# Patient Record
Sex: Female | Born: 1935 | Race: White | Hispanic: No | Smoking: Never smoker
Health system: Southern US, Community
[De-identification: ages and names within clinical notes are randomized; demographics above are authoritative.]

## PROBLEM LIST (undated history)

## (undated) DIAGNOSIS — I2510 Atherosclerotic heart disease of native coronary artery without angina pectoris: Secondary | ICD-10-CM

## (undated) DIAGNOSIS — F32A Depression, unspecified: Secondary | ICD-10-CM

## (undated) DIAGNOSIS — R2689 Other abnormalities of gait and mobility: Secondary | ICD-10-CM

## (undated) DIAGNOSIS — W19XXXA Unspecified fall, initial encounter: Secondary | ICD-10-CM

## (undated) DIAGNOSIS — R0600 Dyspnea, unspecified: Secondary | ICD-10-CM

## (undated) DIAGNOSIS — K312 Hourglass stricture and stenosis of stomach: Secondary | ICD-10-CM

## (undated) DIAGNOSIS — I491 Atrial premature depolarization: Secondary | ICD-10-CM

## (undated) DIAGNOSIS — Z8489 Family history of other specified conditions: Secondary | ICD-10-CM

## (undated) DIAGNOSIS — I480 Paroxysmal atrial fibrillation: Secondary | ICD-10-CM

## (undated) DIAGNOSIS — I119 Hypertensive heart disease without heart failure: Secondary | ICD-10-CM

## (undated) DIAGNOSIS — F419 Anxiety disorder, unspecified: Secondary | ICD-10-CM

## (undated) DIAGNOSIS — F329 Major depressive disorder, single episode, unspecified: Secondary | ICD-10-CM

## (undated) DIAGNOSIS — Z9981 Dependence on supplemental oxygen: Secondary | ICD-10-CM

## (undated) DIAGNOSIS — I272 Pulmonary hypertension, unspecified: Secondary | ICD-10-CM

## (undated) DIAGNOSIS — I4891 Unspecified atrial fibrillation: Secondary | ICD-10-CM

## (undated) DIAGNOSIS — E7800 Pure hypercholesterolemia, unspecified: Secondary | ICD-10-CM

## (undated) DIAGNOSIS — G4730 Sleep apnea, unspecified: Secondary | ICD-10-CM

## (undated) DIAGNOSIS — M1990 Unspecified osteoarthritis, unspecified site: Secondary | ICD-10-CM

## (undated) DIAGNOSIS — E119 Type 2 diabetes mellitus without complications: Secondary | ICD-10-CM

## (undated) DIAGNOSIS — K311 Adult hypertrophic pyloric stenosis: Secondary | ICD-10-CM

## (undated) DIAGNOSIS — Z87440 Personal history of urinary (tract) infections: Secondary | ICD-10-CM

## (undated) DIAGNOSIS — K219 Gastro-esophageal reflux disease without esophagitis: Secondary | ICD-10-CM

## (undated) DIAGNOSIS — R0609 Other forms of dyspnea: Secondary | ICD-10-CM

## (undated) DIAGNOSIS — K5900 Constipation, unspecified: Secondary | ICD-10-CM

## (undated) DIAGNOSIS — R296 Repeated falls: Secondary | ICD-10-CM

## (undated) DIAGNOSIS — G4700 Insomnia, unspecified: Secondary | ICD-10-CM

## (undated) DIAGNOSIS — H269 Unspecified cataract: Secondary | ICD-10-CM

## (undated) HISTORY — DX: Pure hypercholesterolemia, unspecified: E78.00

## (undated) HISTORY — DX: Repeated falls: R29.6

## (undated) HISTORY — DX: Atherosclerotic heart disease of native coronary artery without angina pectoris: I25.10

## (undated) HISTORY — DX: Atrial premature depolarization: I49.1

## (undated) HISTORY — PX: ABDOMINAL SURGERY: SHX537

## (undated) HISTORY — DX: Dyspnea, unspecified: R06.00

## (undated) HISTORY — DX: Other abnormalities of gait and mobility: R26.89

## (undated) HISTORY — PX: LAPAROSCOPIC SALPINGO OOPHERECTOMY: SHX5927

## (undated) HISTORY — PX: INNER EAR SURGERY: SHX679

## (undated) HISTORY — DX: Sleep apnea, unspecified: G47.30

## (undated) HISTORY — PX: TONSILLECTOMY: SUR1361

## (undated) HISTORY — PX: EYE SURGERY: SHX253

## (undated) HISTORY — DX: Insomnia, unspecified: G47.00

## (undated) HISTORY — DX: Hypertensive heart disease without heart failure: I11.9

## (undated) HISTORY — DX: Unspecified osteoarthritis, unspecified site: M19.90

## (undated) HISTORY — DX: Major depressive disorder, single episode, unspecified: F32.9

## (undated) HISTORY — PX: COLONOSCOPY: SHX174

## (undated) HISTORY — DX: Hourglass stricture and stenosis of stomach: K31.2

## (undated) HISTORY — DX: Gastro-esophageal reflux disease without esophagitis: K21.9

## (undated) HISTORY — DX: Unspecified fall, initial encounter: W19.XXXA

## (undated) HISTORY — DX: Unspecified cataract: H26.9

## (undated) HISTORY — PX: JOINT REPLACEMENT: SHX530

## (undated) HISTORY — DX: Adult hypertrophic pyloric stenosis: K31.1

## (undated) HISTORY — DX: Other forms of dyspnea: R06.09

## (undated) HISTORY — DX: Unspecified atrial fibrillation: I48.91

## (undated) HISTORY — DX: Depression, unspecified: F32.A

## (undated) HISTORY — DX: Anxiety disorder, unspecified: F41.9

## (undated) HISTORY — DX: Pulmonary hypertension, unspecified: I27.20

---

## 1968-03-08 HISTORY — PX: ABDOMINAL HYSTERECTOMY: SHX81

## 1999-04-14 ENCOUNTER — Encounter: Payer: Self-pay | Admitting: Cardiology

## 1999-04-14 ENCOUNTER — Encounter: Admission: RE | Admit: 1999-04-14 | Discharge: 1999-04-14 | Payer: Self-pay | Admitting: Cardiology

## 2000-05-27 ENCOUNTER — Encounter: Admission: RE | Admit: 2000-05-27 | Discharge: 2000-05-27 | Payer: Self-pay | Admitting: Internal Medicine

## 2000-05-27 ENCOUNTER — Encounter: Payer: Self-pay | Admitting: Internal Medicine

## 2001-05-30 ENCOUNTER — Encounter: Admission: RE | Admit: 2001-05-30 | Discharge: 2001-05-30 | Payer: Self-pay | Admitting: Internal Medicine

## 2001-05-30 ENCOUNTER — Encounter: Payer: Self-pay | Admitting: Internal Medicine

## 2001-07-11 ENCOUNTER — Encounter (INDEPENDENT_AMBULATORY_CARE_PROVIDER_SITE_OTHER): Payer: Self-pay | Admitting: *Deleted

## 2001-07-11 ENCOUNTER — Ambulatory Visit (HOSPITAL_COMMUNITY): Admission: RE | Admit: 2001-07-11 | Discharge: 2001-07-11 | Payer: Self-pay | Admitting: Gastroenterology

## 2002-06-01 ENCOUNTER — Encounter: Payer: Self-pay | Admitting: Internal Medicine

## 2002-06-01 ENCOUNTER — Encounter: Admission: RE | Admit: 2002-06-01 | Discharge: 2002-06-01 | Payer: Self-pay | Admitting: Internal Medicine

## 2003-09-06 ENCOUNTER — Ambulatory Visit (HOSPITAL_COMMUNITY): Admission: RE | Admit: 2003-09-06 | Discharge: 2003-09-06 | Payer: Self-pay | Admitting: Internal Medicine

## 2004-06-24 ENCOUNTER — Encounter: Admission: RE | Admit: 2004-06-24 | Discharge: 2004-06-24 | Payer: Self-pay | Admitting: Internal Medicine

## 2004-07-13 ENCOUNTER — Encounter: Admission: RE | Admit: 2004-07-13 | Discharge: 2004-07-13 | Payer: Self-pay | Admitting: Otolaryngology

## 2004-07-15 ENCOUNTER — Ambulatory Visit (HOSPITAL_COMMUNITY): Admission: RE | Admit: 2004-07-15 | Discharge: 2004-07-15 | Payer: Self-pay | Admitting: Otolaryngology

## 2004-07-15 ENCOUNTER — Ambulatory Visit (HOSPITAL_BASED_OUTPATIENT_CLINIC_OR_DEPARTMENT_OTHER): Admission: RE | Admit: 2004-07-15 | Discharge: 2004-07-15 | Payer: Self-pay | Admitting: Otolaryngology

## 2004-09-14 ENCOUNTER — Ambulatory Visit (HOSPITAL_COMMUNITY): Admission: RE | Admit: 2004-09-14 | Discharge: 2004-09-14 | Payer: Self-pay | Admitting: Internal Medicine

## 2007-11-30 ENCOUNTER — Encounter: Payer: Self-pay | Admitting: Cardiology

## 2007-11-30 HISTORY — PX: NM MYOVIEW LTD: HXRAD82

## 2009-01-21 ENCOUNTER — Encounter: Admission: RE | Admit: 2009-01-21 | Discharge: 2009-01-21 | Payer: Self-pay | Admitting: Internal Medicine

## 2009-07-23 ENCOUNTER — Encounter: Payer: Self-pay | Admitting: Cardiology

## 2009-07-23 HISTORY — PX: TRANSTHORACIC ECHOCARDIOGRAM: SHX275

## 2009-12-24 ENCOUNTER — Encounter: Admission: RE | Admit: 2009-12-24 | Discharge: 2009-12-24 | Payer: Self-pay | Admitting: Internal Medicine

## 2010-07-24 NOTE — Op Note (Signed)
NAME:  Melinda, [name] NO.:  [id number]   MEDICAL RECORD NO.:  [id number]          PATIENT TYPE:  AMB   LOCATION:  DSC                          FACILITY:  [facility]   PHYSICIAN:  Hermelinda Medicus, M.D.   DATE OF BIRTH:  January 04, 1932   DATE OF PROCEDURE:  DATE OF DISCHARGE:                                 OPERATIVE REPORT   PREOPERATIVE DIAGNOSIS:  Status post left mastoid tympanoplasty with  ossicular reconstruction approximately 30 years previous.  Presently,  conductive hearing loss sudden deterioration secondary to middle ear  pressure with a superimposed sensory neural loss.   POSTOPERATIVE DIAGNOSIS:  Status post left mastoid tympanoplasty with  ossicular reconstruction approximately 30 years previous.  Presently,  conductive hearing loss sudden deterioration secondary to middle ear  pressure with a superimposed sensory neural loss.   OPERATION:  Exploratory tympanotomy with evaluation of ossicular chain and  ossicular chain reconstruction.   OPERATOR:  Dr. Hermelinda Medicus.   ANESTHESIA:  MAC with Dr. Katrinka Blazing with local anesthesia.   PROCEDURE:  The patient was placed in supine position, left ear elevated,  and was prepped and draped using Betadine and the usual otologic drape.  We  brought the scope into place and the four quadrants of the external ear  canal were injected with 1% Xylocaine with epinephrine, and then the  tympanotomy incision was carried out and carried down to the middle ear  where we reflected the tympanic membrane back and immediately found a  malleus reconstruction where the malleus had moved posteriorly to a point  where it was not in contact with the stapes.  We could tell by moving it  around that it was attached to the tympanic membrane but was not attached to  the stapes itself breaking this ossicular chain continuity.  We were able to  mobilize this malleus and we then rotated it anteriorly so it would come in  contact once again.  We  rasped the bottom side of it so it would attach to  the stapes, and we also cleared the stapes caput or head from any scar  tissue or mucous membrane so that it would also attach.  Once we rotated  this into place, then the contact was obvious, and we placed Gelfoam around  this malleus using it as an implant and using the Gelfoam to stabilize this  malleus implant.  Once this was completed, we then placed the tympanic  membrane back in its original position and then tested her hearing which was  markedly improved. She said she heard like she used to previous to this  problem; therefore, she had just displaced her malleus and we replaced hers  back in its original position and then stabilized this so that she could get  her conductive hearing loss improved. Gelfoam was then placed lateral to the  tympanic membrane in an effort to compress the tympanic membrane so that it  would compress the malleus against the stapes to hold it in place as it  healed.  The Gelfoam was then placed around the incision of the tympanotomy  and then Neosporin ointment was placed.  Cotton in the external ear canal  was placed. The patient tolerated procedure well and is doing well  postoperatively.  During the procedure, she was under local because of any  trauma to the stapes she would be able to tell us immediately.  As we  were mobilizing this malleus, she had no vertigo, she had no excessive  noise.  She was very pleased and very comfortable during the procedure and  then heard very well in her final testing time. Her follow-up will be in one  week, three weeks, and six weeks, and three months and six months.      JC/MEDQ  D:  07/15/2004  T:  07/15/2004  Job:  [id number]   cc:   Loraine Leriche A. Waynard Edwards, M.D.  [address]  [place]  [place] [address]  Fax: [phone]

## 2010-07-24 NOTE — H&P (Signed)
NAME:  Melinda [name], Melinda [name] NO.:  [id number]   MEDICAL RECORD NO.:  [id number]          PATIENT TYPE:  AMB   LOCATION:  DSC                          FACILITY:  [facility]   PHYSICIAN:  Hermelinda Medicus, M.D.   DATE OF BIRTH:  1932-02-19   DATE OF ADMISSION:  07/15/2004  DATE OF DISCHARGE:                                HISTORY & PHYSICAL   HISTORY OF PRESENT ILLNESS:  This patient is a 77 year old female who  approximately 30 years ago had a left mastoid tympanoplasty with acicular  reconstruction under my care, and she has done extremely well over these  years.  However, recently, she apparently was not hearing as well and tried  to pop her ear by putting pressure on this, and suddenly, the hearing just  totally dropped off.  She came into the office and audiogram showed  proximally a 40-50 decibel sensory neural loss, but on top of this, a 45-50  decibel conductive hearing loss with a very loose impedence study indicating  that the ossicular chain had been separated and the tympanic membrane was  just very mobile showing no communication with stapes.  With this, we  decided to get a CT scan also of her temporal bones and showing post  surgical changes in the left ossicular chain where the malleolus had been  mobilized and put over the stapes, but this was moved and apparently on the  CT scan it was showing no communication with the stapes.  She now wishes to  hear better.  She accepts the fact that she is still going to have the 45-50  decibel sensory neural hearing loss, but right now, she has a totally  fundamentally non-aideable ear, and she wishes to try to use a hearing aid.  If she can gain back some of the conductive loss, then we can fit her to a  hearing aid where she will have quite effective hearing.  Her past history  is remarkable in the fact that she has a phenobarbital allergy rash.  She is  on Nortriptyline 25, Pravachol, aspirin, Prilosec 20, Metoprolol 50,  Glycolax 17 and clorazepate dipotassium 7.5 mg for anxiety.  Her hemoglobin  is 14.1, and she has had a history of left ear surgery above mentioned,  hysterectomy for ovarian cyst treatment in age 76's, and she has had  cataract removed 3-4 years ago.  She had a stress test where she had right  chest pain which showed normal results done approximately three years ago by  Dr. Swaziland.   PHYSICAL EXAMINATION:  VITAL SIGNS:  Blood pressure 138/73, respirations 20,  heart rate 60, weight 170.  HEENT:  Right ear is in good condition, and is hearing really quite well  with moderate sensory neural loss and no conductive loss of note.  Her left  ear appears quite excellent in terms of the tympanic membrane, but it is  very mobile, and she has a sensory neural plus the conductive hearing loss.  Above mentioned.  CARDIOVASCULAR:  Unremarkable.  No __________ mass, murmurs or gallops.  Workup with Dr. Swaziland revealed normal EKG and showing normal stress  Cardiolite perfusion study, normal left ventricular function, hypertensive  blood pressure response to exercise.  Ejection fraction is calculated at  78%.  ABDOMEN:  Unremarkable.  EXTREMITIES:  Unremarkable.   DIAGNOSIS:  History of left mastoid tympanoplasty with ossicular chain  disruption, history of chest pain, completely evaluated with a normal  Cardiolite perfusion study, history of hysterectomy and ovarian cyst care,  history of cataract removal.      JC/MEDQ  D:  07/15/2004  T:  07/15/2004  Job:  [id number]   cc:   Loraine Leriche A. Waynard Edwards, M.D.  [address]  [place]  [place] [address]  Fax: [phone]

## 2010-07-24 NOTE — Procedures (Signed)
[facility]  Patient:    Melinda [name], Melinda [name] Visit Number: [id number] MRN: [id number]          Service Type: END Location: ENDO Attending Physician:  Louie Bun Dictated by:   Everardo All Madilyn Fireman, M.D. Proc. Date: 07/11/01 Admit Date:  07/11/2001   CC:         Rodrigo Ran, M.D.   Procedure Report  PROCEDURE:  Colonoscopy with polypectomy.  INDICATION FOR PROCEDURE:  Family history of colon cancer in a first degree relative.  DESCRIPTION OF PROCEDURE:  The patient was placed in the left lateral decubitus position and placed on the pulse monitor with continuous low flow oxygen delivered via nasal cannula. She was sedated with 50 mg IV Demerol and 6 mg IV Versed. The Olympus video colonoscope was inserted into the rectum and advanced to the cecum, confirmed by transillumination of McBurneys point and visualization of the ileocecal valve an appendiceal orifice. The prep was excellent. The cecum and ascending colon appeared normal. In the proximal transverse colon, there was seen a small 7 to 8-mm sessile polyp which was fulgurated by hot biopsy. The remainder of the transverse colon appeared normal. In the proximal descending colon, there was seen a 6-mm sessile polyp, also fulgurated by hot biopsy. The remainder of the descending, sigmoid, and rectum appeared normal with no further polyps, masses, diverticula or other mucosal abnormalities. Retroflex view of the anus revealed no obvious internal hemorrhoids. The colonoscope was then withdrawn and the patient returned to the recovery room in stable condition. She tolerated the procedure well and there were no immediate complications.  IMPRESSION:  Transverse and descending colon polyps.  PLAN:  Await histology for determination of interval for next colonoscopy. Dictated by:   Everardo All Madilyn Fireman, M.D. Attending Physician:  Louie Bun DD:  07/11/01 TD:  07/11/01 Job: [id number]

## 2010-10-05 ENCOUNTER — Other Ambulatory Visit: Payer: Self-pay | Admitting: Internal Medicine

## 2010-10-05 DIAGNOSIS — Z1231 Encounter for screening mammogram for malignant neoplasm of breast: Secondary | ICD-10-CM

## 2010-12-31 ENCOUNTER — Ambulatory Visit
Admission: RE | Admit: 2010-12-31 | Discharge: 2010-12-31 | Disposition: A | Discharge status: Home / Self Care | Payer: Medicare Other | Source: Ambulatory Visit | Attending: Internal Medicine | Admitting: Internal Medicine

## 2010-12-31 DIAGNOSIS — Z1231 Encounter for screening mammogram for malignant neoplasm of breast: Secondary | ICD-10-CM

## 2011-06-03 ENCOUNTER — Encounter: Payer: Self-pay | Admitting: *Deleted

## 2011-11-26 ENCOUNTER — Other Ambulatory Visit: Payer: Self-pay | Admitting: Internal Medicine

## 2011-11-26 DIAGNOSIS — Z1231 Encounter for screening mammogram for malignant neoplasm of breast: Secondary | ICD-10-CM

## 2012-01-03 ENCOUNTER — Ambulatory Visit
Admission: RE | Admit: 2012-01-03 | Discharge: 2012-01-03 | Disposition: A | Discharge status: Home / Self Care | Payer: Medicare Other | Source: Ambulatory Visit | Attending: Internal Medicine | Admitting: Internal Medicine

## 2012-01-03 DIAGNOSIS — Z1231 Encounter for screening mammogram for malignant neoplasm of breast: Secondary | ICD-10-CM

## 2012-01-05 ENCOUNTER — Ambulatory Visit: Payer: Medicare Other | Attending: Neurology | Admitting: *Deleted

## 2012-01-05 DIAGNOSIS — Z9181 History of falling: Secondary | ICD-10-CM | POA: Insufficient documentation

## 2012-01-05 DIAGNOSIS — R269 Unspecified abnormalities of gait and mobility: Secondary | ICD-10-CM | POA: Insufficient documentation

## 2012-01-05 DIAGNOSIS — IMO0001 Reserved for inherently not codable concepts without codable children: Secondary | ICD-10-CM | POA: Insufficient documentation

## 2012-01-17 ENCOUNTER — Ambulatory Visit: Payer: Medicare Other | Attending: Neurology | Admitting: Physical Therapy

## 2012-01-17 DIAGNOSIS — R269 Unspecified abnormalities of gait and mobility: Secondary | ICD-10-CM | POA: Insufficient documentation

## 2012-01-17 DIAGNOSIS — Z9181 History of falling: Secondary | ICD-10-CM | POA: Insufficient documentation

## 2012-01-17 DIAGNOSIS — IMO0001 Reserved for inherently not codable concepts without codable children: Secondary | ICD-10-CM | POA: Insufficient documentation

## 2012-01-19 ENCOUNTER — Ambulatory Visit: Payer: Medicare Other | Admitting: Physical Therapy

## 2012-01-24 ENCOUNTER — Ambulatory Visit: Payer: Medicare Other | Admitting: *Deleted

## 2012-01-26 ENCOUNTER — Ambulatory Visit: Payer: Medicare Other | Admitting: Physical Therapy

## 2012-01-27 ENCOUNTER — Encounter: Payer: Medicare Other | Admitting: Physical Therapy

## 2012-02-07 ENCOUNTER — Ambulatory Visit: Payer: Medicare Other | Attending: Neurology | Admitting: Physical Therapy

## 2012-02-07 DIAGNOSIS — IMO0001 Reserved for inherently not codable concepts without codable children: Secondary | ICD-10-CM | POA: Insufficient documentation

## 2012-02-07 DIAGNOSIS — R269 Unspecified abnormalities of gait and mobility: Secondary | ICD-10-CM | POA: Insufficient documentation

## 2012-02-07 DIAGNOSIS — Z9181 History of falling: Secondary | ICD-10-CM | POA: Insufficient documentation

## 2012-02-10 ENCOUNTER — Ambulatory Visit: Payer: Medicare Other | Admitting: Physical Therapy

## 2012-02-14 ENCOUNTER — Encounter: Payer: Medicare Other | Admitting: Physical Therapy

## 2012-02-17 ENCOUNTER — Encounter: Payer: Medicare Other | Admitting: Physical Therapy

## 2012-06-12 ENCOUNTER — Other Ambulatory Visit: Payer: Self-pay | Admitting: Internal Medicine

## 2012-06-12 DIAGNOSIS — M545 Low back pain, unspecified: Secondary | ICD-10-CM

## 2012-06-13 ENCOUNTER — Other Ambulatory Visit: Payer: Self-pay | Admitting: Internal Medicine

## 2012-06-13 DIAGNOSIS — Z139 Encounter for screening, unspecified: Secondary | ICD-10-CM

## 2012-06-15 ENCOUNTER — Ambulatory Visit
Admission: RE | Admit: 2012-06-15 | Discharge: 2012-06-15 | Disposition: A | Discharge status: Home / Self Care | Payer: Medicare Other | Source: Ambulatory Visit | Attending: Internal Medicine | Admitting: Internal Medicine

## 2012-06-15 DIAGNOSIS — Z139 Encounter for screening, unspecified: Secondary | ICD-10-CM

## 2012-06-17 ENCOUNTER — Ambulatory Visit
Admission: RE | Admit: 2012-06-17 | Discharge: 2012-06-17 | Disposition: A | Discharge status: Home / Self Care | Payer: Medicare Other | Source: Ambulatory Visit | Attending: Internal Medicine | Admitting: Internal Medicine

## 2012-06-17 DIAGNOSIS — M545 Low back pain, unspecified: Secondary | ICD-10-CM

## 2012-10-19 ENCOUNTER — Encounter: Payer: Self-pay | Admitting: Neurology

## 2012-10-19 ENCOUNTER — Ambulatory Visit (INDEPENDENT_AMBULATORY_CARE_PROVIDER_SITE_OTHER): Payer: Medicare Other | Admitting: Neurology

## 2012-10-19 VITALS — BP 157/79 | HR 71 | Resp 18 | Ht 60.0 in | Wt 169.0 lb

## 2012-10-19 DIAGNOSIS — E1142 Type 2 diabetes mellitus with diabetic polyneuropathy: Secondary | ICD-10-CM

## 2012-10-19 DIAGNOSIS — G4730 Sleep apnea, unspecified: Secondary | ICD-10-CM

## 2012-10-19 DIAGNOSIS — E1149 Type 2 diabetes mellitus with other diabetic neurological complication: Secondary | ICD-10-CM

## 2012-10-19 DIAGNOSIS — G4733 Obstructive sleep apnea (adult) (pediatric): Secondary | ICD-10-CM

## 2012-10-19 DIAGNOSIS — E1140 Type 2 diabetes mellitus with diabetic neuropathy, unspecified: Secondary | ICD-10-CM

## 2012-10-19 DIAGNOSIS — R269 Unspecified abnormalities of gait and mobility: Secondary | ICD-10-CM

## 2012-10-19 DIAGNOSIS — R2689 Other abnormalities of gait and mobility: Secondary | ICD-10-CM

## 2012-10-19 HISTORY — DX: Sleep apnea, unspecified: G47.30

## 2012-10-19 NOTE — Patient Instructions (Signed)

## 2012-10-19 NOTE — Progress Notes (Signed)
[facility]  Provider:  Melvyn Novas, M D  Referring Provider: No ref. provider found Primary Care Physician:  Ezequiel Kayser, MD  Chief Complaint  Patient presents with  . Follow-up    rm 11,hypersomnia    HPI:  Melinda [name] is a 79 y.o. female  Is seen here as a referral/ revisit  from Dr. Waynard Edwards for her yearly Apnea check up, compliance with CPAP .  [name] endorsed the Epworth sleepiness score today at 7 points and the GDS score at points. She reports her fatigue severity at 34 points. She has brought her machine which is nearly 77 years old -I am still waiting for the 2014 download.  At baseline,  the patient had been tested in on 10/13/2005 and was diagnosed with rather mild sleep apnea but with  strong REM sleep accentuation.  She had also oxygen desaturations at night,  a download in October 2013 documented  residual AHI  of 5.1 , only marginally less than her baseline AHI - the average user time  was  6 hours,  the machine is set at 10 cm water. She needed a new  DME after she did not receive her care she needed from her previous [employer] and she is [facility] with [facility]  The respiratory technician also changed her from a full facemask to a nasal mask and she is able to tolerate that very well. I would like to add that in 2008 the patient underwent a pulse oximetry which seemed to have been rub or artifact performed based on that she was prescribed 2 L of oxygen at night,  that but was never able to obtain the oxygen on [employer]. She is not on oxygen now, she is using CPAP at the previous settings,  I would like  To state here , that if her apnea hypopnea index remains at higher single digits , she may need to undergo or another auto titration to just see if she still needs CPAP at all.  She has recently developed some insomnia stating that she would be wide awake in the early morning hours. Her experience with Ambien as a sleep aid  has been testing. The patient apparently had some sleep or night terrors and sleep walking activity she has for example unlocked doors and left [name] tore her house at night she was also at one time witnessed to be screaming when she was dreaming but had a nightmarish vision. After discontinuing the Ambien these events have stopped.  The patient reports that her gait problem has stabilized she's doing better had no fall for the last 6 months. She was finally diagnosed officially with lumbar spinal stenosis, she has lost some muscle mass all through the  hamstrings and thigh, lost some core muscle strength. She had previously fallen many times onto her left knee which still gives her trouble now. He had chiropractic treatments to the left hip and leg. The bony injury to the knee was ruled out. The patient has more pain in her knee when walking on tiptoes, she does not have foot drop .  Review of Systems: Out of a complete 14 system review, the patient complains of only the following symptoms, and all other reviewed systems are negative. Knee and leg pain, weakness, needs to brace herself to leave a seated position.   History   Social History  . Marital Status: Married    Spouse Name: N/A  Number of Children: 1  . Years of Education: 13   Occupational History  . Retired    Social History Main Topics  . Smoking status: Never Smoker   . Smokeless tobacco: Never Used  . Alcohol Use: Yes     Comment: occas.  . Drug Use: No  . Sexual Activity: Not on file   Other Topics Concern  . Not on file   Social History Narrative  . No narrative on file    Family History  Problem Relation Age of Onset  . Heart attack Father   . Heart failure Mother   . Cancer Mother     colon,kidney  . Diabetes Sister   . Hypertension Sister     Past Medical History  Diagnosis Date  . Hypercholesterolemia   . Acid reflux   . Obstructive sleep apnea   . Depression   . Insomnia   . Premature  atrial contractions   . Anxiety   . Hypertension   . Cataract     surgery,bilateral  . Pyloric antral stenosis     in childhood,infancy  . Multifactorial gait disorder   . Sleep apnea with use of continuous positive airway pressure (CPAP) 10/19/2012    Past Surgical History  Procedure Laterality Date  . Abdominal hysterectomy  1970  . Transthoracic echocardiogram  07/23/2009    mild concentric LVH/mild mitral insufficiency/ moderate tricuspid innsufficiency/mild pulmonary HTN/EF- 55-60%  . Nm myoview ltd  11/30/2007    normal stress nuclear study/EF-83%  . Vaginal delivery    . Inner ear surgery Left     Current Outpatient Prescriptions  Medication Sig Dispense Refill  . acetaminophen (TYLENOL) 325 MG tablet Take 650 mg by mouth every 6 (six) hours as needed.      [name] amLODipine (NORVASC) 5 MG tablet Take 5 mg by mouth daily.      . Armodafinil (NUVIGIL) 250 MG tablet Take 250 mg by mouth daily. As needed,take 1/2 mornings,1/2 early afternoon.      [name] aspirin 81 MG tablet Take 81 mg by mouth daily.      [name] atorvastatin (LIPITOR) 40 MG tablet Take 40 mg by mouth daily. One tablet in am      . benazepril (LOTENSIN) 20 MG tablet Take 20 mg by mouth daily.      . calcium carbonate (OS-CAL) 600 MG TABS tablet Take 600 mg by mouth daily.      . cholecalciferol (VITAMIN D) 1000 UNITS tablet Take 1,000 Units by mouth daily.      [name] desipramine (NORPRAMIN) 25 MG tablet Take 25 mg by mouth daily.      [name] glucose blood (ACCU-CHEK ACTIVE STRIPS) test strip 1 each by Other route as needed for other. Use as instructed      . metoprolol (LOPRESSOR) 50 MG tablet Take 1/2 tablet by mouth twice daily      . Omega-3 Fatty Acids (FISH OIL) 1200 MG CAPS Take by mouth daily.      [name] omeprazole (PRILOSEC) 20 MG capsule Take 20 mg by mouth daily. One tablet daily in mornings      . polyethylene glycol powder (GLYCOLAX/MIRALAX) powder Take as needed      . temazepam (RESTORIL) 7.5 MG capsule Take 7.5 mg by mouth  daily. Take 1-2 as needed      . vitamin B-12 (CYANOCOBALAMIN) 500 MCG tablet Take 1,000 mcg by mouth daily.        No current facility-administered medications for this visit.  Allergies as of 10/19/2012 - Review Complete 10/19/2012  Allergen Reaction Noted  . Phenobarbital Itching 06/03/2011    Vitals: BP 157/79  Pulse 71  Resp 18  Ht 5' (1.524 m)  Wt 169 lb (76.658 kg)  BMI 33.01 kg/m2 Last Weight:  Wt Readings from Last 1 Encounters:  10/19/12 169 lb (76.658 kg)   Last Height:   Ht Readings from Last 1 Encounters:  10/19/12 5' (1.524 m)   BMI  .  Physical exam:  General: The patient is awake, alert and appears not in acute distress. The patient is well groomed. Head: Normocephalic, atraumatic. Neck is supple. Mallampati  3 , neck circumference: 14 inches, no nasal deviation.  Rhinitis.   Cardiovascular:  Regular rate and rhythm, without  murmurs or carotid bruit, and without distended neck veins. Respiratory: Lungs are clear to auscultation. Skin:  Without evidence of edema, or rash Trunk:obese   Neurologic exam : The patient is awake and alert, oriented to place and time.  Memory subjectivedescribed as intact. There is a normal attention span & concentration ability. Speech is fluent without  dysphonia , not  Aphasia.  Mood and affect are appropriate.  Cranial nerves: Pupils are equal and briskly reactive to light. Funduscopic exam without  evidence of pallor or edema. Extraocular movements  in vertical and horizontal planes intact and without nystagmus. Visual fields by finger perimetry are intact. Hearing to finger rub intact.  Facial sensation intact to fine touch. Facial motor strength is symmetric and tongue and uvula move midline.  Motor exam:  Distal movement and feet hand is intact, the patient is crepitation of the left knee which also limits her range of motion. She has hip abductor weakness. She does not necessarily list to one side or the  other Sensory:  Fine touch, pinprick and vibration were tested in all extremities. The patient reported pain in needle sensation in the first 2 toes of either foot, she has lost vibratory sense in her pool was she has also lost vibratory sense over the bunions she was unable to feel fine filament touch on toe was warm median foot. The right ankle was still vibration sensitive, the left is not.  Proprioception is tested in the upper extremities normal. The patient was able to perform the finger-to-nose test without dysmetria, ataxia that may be a mild action tremor that of almost unnoticeable amplitude.  Coordination: Rapid alternating movements in the fingers/hands is tested and normal. Finger-to-nose maneuverwithout evidence of ataxia, dysmetria or tremor.  Gait and station: Patient walks without assistive device, wide based. The patient has in the past fall and propulsive forward and had actually had and face injury in 2013. She does not list to one side, she was able to walk a couple of steps on her tiptoes but with pain in the knee region especially on the left side. Heel stand and heel gait is intact. Romberg bundle for is positive, the patient displays pulses and retropulsive not to the side. Without optic control she feels very insecure on her feet. She reports that if she walks in a dark room she runs into objects she has no sense of orientation. Deep tendon reflexes: in the  upper and lower extremities are attenuated. Babinski maneuver response is equivocal ..   Assessment:  After physical and neurologic examination, review of laboratory studies, imaging, neurophysiology testing and pre-existing records, assessment is  #1 the patient has limbal spinal stenosis which has led to some muscle mass loss especially at the  quadriceps the hip flexors. There is mostly proximal claustral causal muscle weakness. #2 the patient has neuropathy with associated gait and balance and stability positive Romberg  sign but no cerebellar signs. #3 mild sleep apnea but at least moderate to severe hypoxemia in the past documented in the sleep study 7 years ago. The patient has continued to use CPAP. I would like to reevaluate her if she has still the need for apnea treatment, her machine is actually faxed to her current needed pressures. For this we will order an audible titration for 30 days and allow a pressure window between 6 and 13 cm water. [facility]  Plan:  See above .  Check B12, TSH and HbA1c , may have been recently tested with Dr Waynard Edwards in May .  DME Gait stability exercises , core strength built up.

## 2012-10-19 NOTE — Assessment & Plan Note (Signed)
lumbal spinal stenosis- cause for gait disorder.

## 2012-10-23 LAB — TSH: TSH: 3.2 u[IU]/mL (ref 0.450–4.500)

## 2012-11-30 ENCOUNTER — Encounter: Payer: Self-pay | Admitting: Neurology

## 2012-12-18 ENCOUNTER — Other Ambulatory Visit: Payer: Self-pay

## 2012-12-18 DIAGNOSIS — Z1231 Encounter for screening mammogram for malignant neoplasm of breast: Secondary | ICD-10-CM

## 2012-12-21 ENCOUNTER — Encounter: Payer: Self-pay | Admitting: Neurology

## 2012-12-22 ENCOUNTER — Telehealth: Payer: Self-pay | Admitting: Neurology

## 2012-12-22 DIAGNOSIS — G4733 Obstructive sleep apnea (adult) (pediatric): Secondary | ICD-10-CM

## 2012-12-22 NOTE — Telephone Encounter (Signed)
Increase pressure setting to 9 cm water .

## 2013-01-11 ENCOUNTER — Other Ambulatory Visit: Payer: Self-pay

## 2013-01-12 ENCOUNTER — Ambulatory Visit
Admission: RE | Admit: 2013-01-12 | Discharge: 2013-01-12 | Disposition: A | Discharge status: Home / Self Care | Payer: Medicare Other | Source: Ambulatory Visit

## 2013-01-12 DIAGNOSIS — Z1231 Encounter for screening mammogram for malignant neoplasm of breast: Secondary | ICD-10-CM

## 2013-01-19 ENCOUNTER — Encounter: Payer: Self-pay | Admitting: Nurse Practitioner

## 2013-01-19 ENCOUNTER — Ambulatory Visit (INDEPENDENT_AMBULATORY_CARE_PROVIDER_SITE_OTHER): Payer: Medicare Other | Admitting: Nurse Practitioner

## 2013-01-19 VITALS — BP 133/61 | HR 69 | Ht 60.0 in | Wt 175.0 lb

## 2013-01-19 DIAGNOSIS — G4733 Obstructive sleep apnea (adult) (pediatric): Secondary | ICD-10-CM

## 2013-01-19 DIAGNOSIS — R2689 Other abnormalities of gait and mobility: Secondary | ICD-10-CM

## 2013-01-19 DIAGNOSIS — G4730 Sleep apnea, unspecified: Secondary | ICD-10-CM

## 2013-01-19 DIAGNOSIS — R269 Unspecified abnormalities of gait and mobility: Secondary | ICD-10-CM

## 2013-01-19 NOTE — Progress Notes (Signed)
[name]   [facility]  PATIENT: Melinda [name] DOB: 08/26/1931   REASON FOR VISIT: Followup for gait disorder   HISTORY OF PRESENT ILLNESS:Ms Melinda [name], 79 year old female returns for followup. She was just seen in the office 10/19/2012 by Dr. Vickey [name]. She patient reports that her gait problem has stabilized she's doing better had no fall for the last 9 months. She occasionally can use a cane or walker on bad days but not very often. She has also been checking her blood sugars in one day when she felt she was going to pass out her blood sugar was 60. She was finally diagnosed officially with lumbar spinal stenosis, she has lost some muscle mass all through the hamstrings and thigh, lost some core muscle strength. She had previously fallen many times onto her left knee which still gives her trouble at times  She had chiropractic treatments to the left hip and leg. The bony injury to the knee was ruled out. The patient has more pain in her knee when walking on tiptoes, she does not have foot drop . She says she has been told she has arthritis. She also complains of occasional burning paresthesias in the leg however this is very brief and once or twice a month. She does not wish to be placed on a medication.    REVIEW OF SYSTEMS: Full 14 system review of systems performed and notable only for:  Constitutional: Fatigue Cardiovascular: N/A  Ear/Nose/Throat: Hearing loss  Skin: N/A  Eyes: N/A  Respiratory: N/A  Gastroitestinal: N/A  Hematology/Lymphatic: Easy bleeding  Endocrine: N/A Musculoskeletal: Joint pain Allergy/Immunology: Runny nose  Psychiatric: Decreased energy  ALLERGIES: Allergies  Allergen Reactions  . Phenobarbital Itching    HOME MEDICATIONS: Outpatient Prescriptions Prior to Visit  Medication Sig Dispense Refill  . acetaminophen (TYLENOL) 325 MG tablet Take 650 mg by mouth every 6 (six) hours as needed.      [name] amLODipine (NORVASC) 5 MG tablet  Take 5 mg by mouth daily.      . Armodafinil (NUVIGIL) 250 MG tablet Take 250 mg by mouth daily. As needed,take 1/2 mornings,1/2 early afternoon.      [name] aspirin 81 MG tablet Take 81 mg by mouth daily.      [name] atorvastatin (LIPITOR) 40 MG tablet Take 40 mg by mouth daily. One tablet in am      . benazepril (LOTENSIN) 20 MG tablet Take 20 mg by mouth daily.      . calcium carbonate (OS-CAL) 600 MG TABS tablet Take 600 mg by mouth daily.      . cholecalciferol (VITAMIN D) 1000 UNITS tablet Take 1,000 Units by mouth daily.      [name] desipramine (NORPRAMIN) 25 MG tablet Take 25 mg by mouth daily.      [name] glucose blood (ACCU-CHEK ACTIVE STRIPS) test strip 1 each by Other route as needed for other. Use as instructed      . metoprolol (LOPRESSOR) 50 MG tablet Take 1/2 tablet by mouth twice daily      . Omega-3 Fatty Acids (FISH OIL) 1200 MG CAPS Take by mouth daily.      [name] omeprazole (PRILOSEC) 20 MG capsule Take 20 mg by mouth daily. One tablet daily in mornings      . polyethylene glycol powder (GLYCOLAX/MIRALAX) powder Take as needed      . temazepam (RESTORIL) 7.5 MG capsule Take 7.5 mg by mouth daily. Take 1-2 as needed      .  vitamin B-12 (CYANOCOBALAMIN) 500 MCG tablet Take 1,000 mcg by mouth daily.        No facility-administered medications prior to visit.    PAST MEDICAL HISTORY: Past Medical History  Diagnosis Date  . Hypercholesterolemia   . Acid reflux   . Obstructive sleep apnea   . Depression   . Insomnia   . Premature atrial contractions   . Anxiety   . Hypertension   . Cataract     surgery,bilateral  . Pyloric antral stenosis     in childhood,infancy  . Multifactorial gait disorder   . Sleep apnea with use of continuous positive airway pressure (CPAP) 10/19/2012    PAST SURGICAL HISTORY: Past Surgical History  Procedure Laterality Date  . Abdominal hysterectomy  1970  . Transthoracic echocardiogram  07/23/2009    mild concentric LVH/mild mitral insufficiency/ moderate  tricuspid innsufficiency/mild pulmonary HTN/EF- 55-60%  . Nm myoview ltd  11/30/2007    normal stress nuclear study/EF-83%  . Vaginal delivery    . Inner ear surgery Left     FAMILY HISTORY: Family History  Problem Relation Age of Onset  . Heart attack Father   . Heart failure Mother   . Cancer Mother     colon,kidney  . Diabetes Sister   . Hypertension Sister     SOCIAL HISTORY: History   Social History  . Marital Status: Married    Spouse Name: N/A    Number of Children: 1  . Years of Education: 13   Occupational History  . Retired    Social History Main Topics  . Smoking status: Never Smoker   . Smokeless tobacco: Never Used  . Alcohol Use: Yes     Comment: occas.  . Drug Use: No  . Sexual Activity: Not on file   Other Topics Concern  . Not on file   Social History Narrative   Patient lives alone.    Patient is widowed.    Patient retired.    Patient some college.    Patient has one child.      PHYSICAL EXAM  Filed Vitals:   01/19/13 1359  BP: 133/61  Pulse: 69  Height: 5' (1.524 m)  Weight: 175 lb (79.379 kg)   Body mass index is 34.18 kg/(m^2).  Generalized: Well developed, obese female in no acute distress  Head: normocephalic and atraumatic,. Oropharynx benign  Neck: Supple, no carotid bruits  Cardiac: Regular rate rhythm, no murmur  Neurological examination   Mentation: Alert oriented to time, place, history taking. Follows all commands speech and language fluent. ESS 8  Cranial nerve II-XII: Pupils were equal round reactive to light extraocular movements were full, visual field were full on confrontational test. Facial sensation and strength were normal. hearing was intact to finger rubbing bilaterally. Uvula tongue midline. head turning and shoulder shrug and were normal and symmetric.Tongue protrusion into cheek strength was normal. Motor: normal bulk and tone, full strength in the BUE, BLE, fine finger movements normal, no pronator  drift. No focal weakness Sensory: normal and symmetric to light touch, vibration and proprioception. Decreased pinprick to mid shin bilaterally Coordination: finger-nose-finger, heel-to-shin bilaterally, no dysmetria Reflexes: Brachioradialis 2/2, biceps 2/2, triceps 2/2, patellar 2/2, Achilles 2/2, plantar responses were flexor bilaterally. Gait and Station: Rising up from seated position without assistance, normal stance,  moderate stride, good arm swing, smooth turning, able to perform tiptoe, and heel walking without difficulty. Tandem gait mildly unsteady   DIAGNOSTIC DATA (LABS, IMAGING, TESTING) - I reviewed patient  records, labs, notes, testing and imaging myself where available.       Lab Results  Component Value Date   TSH 3.200 10/19/2012      ASSESSMENT AND PLAN  79 y.o. year old female  has a past medical history of Hypercholesterolemia;  Obstructive sleep apnea; Depression; Insomnia; Multifactorial gait disorder; and Sleep apnea with use of continuous positive airway pressure (CPAP) here for followup. Her CPAP is at 10 cm. She denies any falls in the last 9 months. She has rare paresthesias  TSH and methylmalonic acid return normal Burning paresthesias are intermittent at this point and patient does not wish to be on medication She will call if this becomes more problematic Nilda Riggs, [facility], [facility], APRN  [facility] [address] [place], [place] [address] [phone]

## 2013-01-19 NOTE — Patient Instructions (Signed)
Patient to continue CPAP at 10 cm TSH and methylmalonic acid return normal Burning paresthesias are intermittent at this point and patient does not wish to be on medication She will call if this becomes more problematic

## 2013-07-20 ENCOUNTER — Encounter: Payer: Self-pay | Admitting: Nurse Practitioner

## 2013-07-20 ENCOUNTER — Ambulatory Visit (INDEPENDENT_AMBULATORY_CARE_PROVIDER_SITE_OTHER): Payer: Medicare Other | Admitting: Nurse Practitioner

## 2013-07-20 VITALS — BP 146/75 | HR 65 | Ht 60.0 in | Wt 177.0 lb

## 2013-07-20 DIAGNOSIS — R2689 Other abnormalities of gait and mobility: Secondary | ICD-10-CM

## 2013-07-20 DIAGNOSIS — R269 Unspecified abnormalities of gait and mobility: Secondary | ICD-10-CM

## 2013-07-20 DIAGNOSIS — G4733 Obstructive sleep apnea (adult) (pediatric): Secondary | ICD-10-CM

## 2013-07-20 DIAGNOSIS — G4730 Sleep apnea, unspecified: Secondary | ICD-10-CM

## 2013-07-20 NOTE — Patient Instructions (Signed)
Down load of CPAP F/U in 6 months with Dr. Brett Fairy

## 2013-07-20 NOTE — Progress Notes (Signed)
[facility]  PATIENT: Melinda [name] DOB: [birth year]   REASON FOR VISIT: Followup for sleep apnea and multifactorial gait disorder   HISTORY OF PRESENT ILLNESS: Ms. Melinda [name] 80 year old female returns for followup. She patient reports that her gait problem has stabilized she's doing better had not had a fall for the months.  She occasionally can use a cane or walker on bad days but not very often.  She was finally diagnosed officially with lumbar spinal stenosis, she has lost some muscle mass all through the hamstrings and thigh, lost some core muscle strength. She had previously fallen many times onto her left knee which still gives her trouble at times   The patient has more pain in her knee when walking on tiptoes, she does not have foot drop . She says she has been told she has arthritis. She also complains of occasional burning paresthesias in the leg however this is very brief and once or twice a month. She does not wish to be placed on a medication. She remains on CPAP and she brought her machine for download. She does say that her machine is loud and old and she sometimes cuts it off due to the sound. She returns for reevaluation     REVIEW OF SYSTEMS: Full 14 system review of systems performed and notable only for those listed, all others are neg:  Constitutional: Fatigue  Cardiovascular: N/A  Ear/Nose/Throat: Hearing loss  Skin: N/A  Eyes: N/A  Respiratory: cough, shortness of breath Gastroitestinal: Constipation  Hematology/Lymphatic: Easy bruising Endocrine: N/A Musculoskeletal: Joint pain joint swelling, walking difficulty Allergy/Immunology: N/A  Neurological: N/A Psychiatric: Depression  Sleep : Obstructive sleep apnea   ALLERGIES: Allergies  Allergen Reactions  . Phenobarbital Itching    HOME MEDICATIONS: Outpatient Prescriptions Prior to Visit  Medication Sig Dispense Refill  . acetaminophen (TYLENOL) 325 MG tablet Take 650 mg by  mouth every 6 (six) hours as needed.      [name] amLODipine (NORVASC) 5 MG tablet Take 5 mg by mouth daily.      . Armodafinil (NUVIGIL) 250 MG tablet Take 250 mg by mouth as needed. As needed for driving      . aspirin 81 MG tablet Take 81 mg by mouth daily.      [name] atorvastatin (LIPITOR) 40 MG tablet Take 40 mg by mouth daily. One tablet in am      . benazepril (LOTENSIN) 20 MG tablet Take 20 mg by mouth daily.      . calcium carbonate (OS-CAL) 600 MG TABS tablet Take 600 mg by mouth daily.      . cholecalciferol (VITAMIN D) 1000 UNITS tablet Take 1,000 Units by mouth daily.      [name] desipramine (NORPRAMIN) 25 MG tablet Take 25 mg by mouth daily.      [name] glucose blood (ACCU-CHEK ACTIVE STRIPS) test strip 1 each by Other route as needed for other. Use as instructed      . metoprolol (LOPRESSOR) 50 MG tablet Take 1/2 tablet by mouth twice daily      . Omega-3 Fatty Acids (FISH OIL) 1200 MG CAPS Take by mouth daily.      [name] omeprazole (PRILOSEC) 20 MG capsule Take 20 mg by mouth daily. One tablet daily in mornings      . temazepam (RESTORIL) 7.5 MG capsule Take 7.5 mg by mouth daily. Take 1-2 as needed      . vitamin B-12 (CYANOCOBALAMIN) 500 MCG tablet Take 1,000 mcg by mouth  daily.       . polyethylene glycol powder (GLYCOLAX/MIRALAX) powder Take as needed       No facility-administered medications prior to visit.    PAST MEDICAL HISTORY: Past Medical History  Diagnosis Date  . Hypercholesterolemia   . Acid reflux   . Obstructive sleep apnea   . Depression   . Insomnia   . Premature atrial contractions   . Anxiety   . Hypertension   . Cataract     surgery,bilateral  . Pyloric antral stenosis     in childhood,infancy  . Multifactorial gait disorder   . Sleep apnea with use of continuous positive airway pressure (CPAP) 10/19/2012    PAST SURGICAL HISTORY: Past Surgical History  Procedure Laterality Date  . Abdominal hysterectomy  1970  . Transthoracic echocardiogram  07/23/2009    mild  concentric LVH/mild mitral insufficiency/ moderate tricuspid innsufficiency/mild pulmonary HTN/EF- 55-60%  . Nm myoview ltd  11/30/2007    normal stress nuclear study/EF-83%  . Vaginal delivery    . Inner ear surgery Left     FAMILY HISTORY: Family History  Problem Relation Age of Onset  . Heart attack Father   . Heart failure Mother   . Cancer Mother     colon,kidney  . Diabetes Sister   . Hypertension Sister     SOCIAL HISTORY: History   Social History  . Marital Status: Married    Spouse Name: N/A    Number of Children: 1  . Years of Education: 13   Occupational History  . Retired    Social History Main Topics  . Smoking status: Never Smoker   . Smokeless tobacco: Never Used  . Alcohol Use: Yes     Comment: occas.  . Drug Use: No  . Sexual Activity: Not on file   Other Topics Concern  . Not on file   Social History Narrative   Patient lives alone.    Patient is widowed.    Patient retired.    Patient some college.    Patient has one child.      PHYSICAL EXAM  Filed Vitals:   07/20/13 1033  BP: 146/75  Pulse: 65  Height: 5' (1.524 m)  Weight: 177 lb (80.287 kg)   Body mass index is 34.57 kg/(m^2). Generalized: Well developed, obese female in no acute distress  Head: normocephalic and atraumatic,. Oropharynx benign  Neck: Supple, no carotid bruits  Cardiac: Regular rate rhythm, no murmur  Neurological examination  Mentation: Alert oriented to time, place, history taking. Follows all commands speech and language fluent. ESS 11  Cranial nerve II-XII: Pupils were equal round reactive to light extraocular movements were full, visual field were full on confrontational test. Facial sensation and strength were normal. hearing was intact to finger rubbing bilaterally. Uvula tongue midline. head turning and shoulder shrug and were normal and symmetric.Tongue protrusion into cheek strength was normal.  Motor: normal bulk and tone, full strength in the BUE,  BLE, fine finger movements normal, no pronator drift. No focal weakness  Sensory: normal and symmetric to light touch, vibration and proprioception. Decreased pinprick to mid foot bilaterally  Coordination: finger-nose-finger, heel-to-shin bilaterally, no dysmetria  Reflexes: Brachioradialis 2/2, biceps 2/2, triceps 2/2, patellar 2/2, Achilles 2/2, plantar responses were flexor bilaterally.  Gait and Station: Rising up from seated position without assistance, normal stance, moderate stride, good arm swing, smooth turning, able to perform tiptoe, and heel walking without difficulty. Tandem gait mildly unsteady   DIAGNOSTIC DATA (LABS, IMAGING,  TESTING) - ASSESSMENT AND PLAN  80 y.o. year old female  has a past medical history of  Obstructive sleep apnea with the use of continuous positive airway pressure; Insomnia;  Multifactorial gait disorder here to followup.  ESS score is 11 today  Down load of CPAP reveals air leak but she has had excellent compliance per  CMS criteria for 62 days greater than 4 hours. Her down load was read by Dr. Brett Fairy  F/U in 6 months with Dr. Brett Fairy. Vst time 45 min Dennie Bible, [facility], [facility], APRN  [facility] [address], [place], [place] [address] [phone]

## 2013-07-23 NOTE — Progress Notes (Signed)
I agree with the assessment and plan as directed by NP .The patient is known to me .   Viola Kinnick, MD  

## 2013-10-04 ENCOUNTER — Encounter: Payer: Self-pay | Admitting: Cardiology

## 2013-11-28 NOTE — Telephone Encounter (Signed)
Refer to sleep study note.

## 2013-12-17 ENCOUNTER — Other Ambulatory Visit: Payer: Self-pay

## 2013-12-17 DIAGNOSIS — Z1231 Encounter for screening mammogram for malignant neoplasm of breast: Secondary | ICD-10-CM

## 2014-01-15 ENCOUNTER — Ambulatory Visit
Admission: RE | Admit: 2014-01-15 | Discharge: 2014-01-15 | Disposition: A | Discharge status: Home / Self Care | Payer: Medicare Other | Source: Ambulatory Visit

## 2014-01-15 DIAGNOSIS — Z1231 Encounter for screening mammogram for malignant neoplasm of breast: Secondary | ICD-10-CM

## 2014-01-29 ENCOUNTER — Ambulatory Visit: Payer: Medicare Other | Admitting: Neurology

## 2014-02-05 ENCOUNTER — Encounter: Payer: Self-pay | Admitting: Neurology

## 2014-03-22 ENCOUNTER — Ambulatory Visit (INDEPENDENT_AMBULATORY_CARE_PROVIDER_SITE_OTHER): Payer: Medicare Other | Admitting: Neurology

## 2014-03-22 ENCOUNTER — Encounter: Payer: Self-pay | Admitting: Neurology

## 2014-03-22 VITALS — BP 142/75 | HR 64 | Resp 12 | Ht 59.75 in | Wt 177.0 lb

## 2014-03-22 DIAGNOSIS — F4323 Adjustment disorder with mixed anxiety and depressed mood: Secondary | ICD-10-CM | POA: Insufficient documentation

## 2014-03-22 DIAGNOSIS — G4730 Sleep apnea, unspecified: Secondary | ICD-10-CM

## 2014-03-22 DIAGNOSIS — G4700 Insomnia, unspecified: Secondary | ICD-10-CM | POA: Insufficient documentation

## 2014-03-22 NOTE — Progress Notes (Signed)
[facility]  PATIENT: Melinda [name] DOB: 1931/05/16   REASON FOR VISIT: Followup for sleep apnea and multifactorial gait disorder   HISTORY OF PRESENT ILLNESS: CM, last note 2015 :  Melinda [name] 81 year old female returns for followup. She patient reports that her gait problem has stabilized she's doing better had not had a fall for the months.  She occasionally can use a cane or walker on bad days but not very often.  She was finally diagnosed officially with lumbar spinal stenosis, she has lost some muscle mass all through the hamstrings and thigh, lost some core muscle strength. She had previously fallen many times onto her left knee which still gives her trouble at times   The patient has more pain in her knee when walking on tiptoes, she does not have foot drop . She says she has been told she has arthritis. She also complains of occasional burning paresthesias in the leg however this is very brief and once or twice a month. She does not wish to be placed on a medication. She remains on CPAP and she brought her machine for download. She does say that her machine is loud and old and she sometimes cuts it off due to the sound. She returns for reevaluation  03-22-14: Melinda [name] is here today for his yearly visit. She is an established CPAP user and endorsed today the Epworth sleepiness score at 4 points and fatigue severity score at 47 points.  A download of her CPAP was obtained today in the office showing a moderate to high air leak. However her apnea and hypopnea index is still well controlled and the patient is highly compliant. She is using CPAP at 10 cm water with a residual AHI of 6.8 and an average time of daily CPAP use of 7 hours and 17 minutes. She is considered 90% compliant by CMS criteria which is sufficient. The airleak however is some quite significant. The patient states that she notices a noise from the airleak and sometimes it will wake her up  her machine is an outer set of onset machine and I feel that there must be a more comfortable mask for her to use without creating air leaks. She has used a nasal pillow since last year when I suggested the change. She notices that the airleak is worse when the head gear slips up over her scalp and once she pulls it back down the air leak stops. For the airleak seems to be episodic and not a chronic fact. The patient's medication list and allergy list was reviewed with my nurse, before the patient was back to the [place] area she resided on [place] and she have to drive about 4 hours or longer to see her local physicians at that time she used armodafinil or Nuvigil to keep herself awake and safely driving. There is no needs to refill this medication now.      REVIEW OF SYSTEMS: Full 14 system review of systems performed and notable only for those listed, all others are neg:  Constitutional: Fatigue  Ear/Nose/Throat: Hearing loss  Respiratory: cough, shortness of breath Gastroitestinal: Constipation  Hematology/Lymphatic: Easy bruising Musculoskeletal: Joint pain joint swelling, walking difficulty Psychiatric: Depression  Sleep :  Epworth 4 and FSS 47 points, chronic insomnia, has trouble to fall asleep. Usually reads t waiting to go to sleep.   ALLERGIES: Allergies  Allergen Reactions  . Phenobarbital Itching    HOME MEDICATIONS: Outpatient Prescriptions Prior to Visit  Medication  Sig Dispense Refill  . acetaminophen (TYLENOL) 325 MG tablet Take 650 mg by mouth every 6 (six) hours as needed.    [name] amLODipine (NORVASC) 5 MG tablet Take 5 mg by mouth daily.    [name] aspirin 81 MG tablet Take 81 mg by mouth daily.    [name] atorvastatin (LIPITOR) 40 MG tablet Take 40 mg by mouth daily. One tablet in am    . benazepril (LOTENSIN) 20 MG tablet Take 20 mg by mouth daily.    . Blood Glucose Monitoring Suppl (ONE TOUCH ULTRA 2) W/DEVICE KIT     . calcium carbonate (OS-CAL) 600 MG TABS tablet Take  600 mg by mouth daily.    . cholecalciferol (VITAMIN D) 1000 UNITS tablet Take 1,000 Units by mouth daily.    [name] desipramine (NORPRAMIN) 25 MG tablet Take 25 mg by mouth daily.    [name] glucose blood (ACCU-CHEK ACTIVE STRIPS) test strip 1 each by Other route as needed for other. Use as instructed    . metoprolol (LOPRESSOR) 50 MG tablet Take 1/2 tablet by mouth twice daily    . Omega-3 Fatty Acids (FISH OIL) 1200 MG CAPS Take by mouth daily.    [name] omeprazole (PRILOSEC) 20 MG capsule Take 20 mg by mouth daily. One tablet daily in mornings    . ONETOUCH DELICA LANCETS 19E MISC     . temazepam (RESTORIL) 7.5 MG capsule Take 7.5 mg by mouth daily. Take 1-2 as needed    . vitamin B-12 (CYANOCOBALAMIN) 500 MCG tablet Take 1,000 mcg by mouth daily.     . Armodafinil (NUVIGIL) 250 MG tablet Take 250 mg by mouth as needed. As needed for driving     No facility-administered medications prior to visit.    PAST MEDICAL HISTORY: Past Medical History  Diagnosis Date  . Hypercholesterolemia   . Acid reflux   . Obstructive sleep apnea   . Depression   . Insomnia   . Premature atrial contractions   . Anxiety   . Hypertension   . Cataract     surgery,bilateral  . Pyloric antral stenosis     in childhood,infancy  . Multifactorial gait disorder   . Sleep apnea with use of continuous positive airway pressure (CPAP) 10/19/2012    PAST SURGICAL HISTORY: Past Surgical History  Procedure Laterality Date  . Abdominal hysterectomy  1970  . Transthoracic echocardiogram  07/23/2009    mild concentric LVH/mild mitral insufficiency/ moderate tricuspid innsufficiency/mild pulmonary HTN/EF- 55-60%  . Nm myoview ltd  11/30/2007    normal stress nuclear study/EF-83%  . Vaginal delivery    . Inner ear surgery Left     FAMILY HISTORY: Family History  Problem Relation Age of Onset  . Heart attack Father   . Heart failure Mother   . Cancer Mother     colon,kidney  . Diabetes Sister   . Hypertension Sister      SOCIAL HISTORY: History   Social History  . Marital Status: Married    Spouse Name: N/A    Number of Children: 1  . Years of Education: 13   Occupational History  . Retired    Social History Main Topics  . Smoking status: Never Smoker   . Smokeless tobacco: Never Used  . Alcohol Use: 0.0 oz/week    0 Not specified per week     Comment: rare  . Drug Use: No  . Sexual Activity: Not on file   Other Topics Concern  . Not on file  Social History Narrative   Patient lives alone.    Patient is widowed.    Patient retired.    Patient some college.    Patient has one child.    Caffeine 1-2 cups daily avg.     PHYSICAL EXAM  Filed Vitals:   03/22/14 1106  BP: 142/75  Pulse: 64  Resp: 12  Height: 4' 11.75" (1.518 m)  Weight: 177 lb (80.287 kg)   Body mass index is 34.84 kg/(m^2).   Neck circumference :   15.25   Mallampati:  3, with elongated uvular, red mucosa.    Nasal airflow :congestion, rhinitis . NO TMJ and No retrognathia, dentures, partial upper.  Generalized: Well developed, obese female in no acute distress  Head: normocephalic and atraumatic.Oropharynx benign  Neck: Supple, no carotid bruits  Cardiac: Regular rate rhythm, no murmur  Neurological examination  Mentation: Alert oriented to time, place, history taking. Follows all commands speech and language fluent. ESS 11  Cranial nerve : Pupils were equal round reactive to light extraocular movements were full, visual field were full on confrontational test. Facial sensation and strength were normal. hearing was intact to finger rubbing bilaterally. Uvula tongue midline. head turning and shoulder shrug and were normal and symmetric.Tongue protrusion into cheek strength was normal.  Motor: normal bulk and tone, full strength in the BUE, BLE, fine finger movements normal, no pronator drift. No focal weakness  Sensory: normal and symmetric to light touch, vibration and proprioception. Decreased pinprick to  mid foot bilaterally  Coordination: finger-nose-finger, heel-to-shin bilaterally, no dysmetria  Reflexes: 2/2, plantar responses were flexor bilaterally.  Gait and Station: Rising up from seated position without assistance, normal stance, moderate stride, good arm swing, smooth turning, able to perform tiptoe, and heel walking with only minor  difficulty.  andem gait mildly unsteady   DIAGNOSTIC DATA (LABS, IMAGING, TESTING) None to review, will share with Dr Joylene Draft.  - ASSESSMENT AND PLAN   Obstructive sleep apnea with the use of continuous positive airway pressure; she will stay on nasal pillow but will address the headset fit with DME.  Insomnia;  chronic on CPAP.  Multifactorial gait disorder . No addressed today. RV in 12 month with NP.   [facility] [address], [place], [place] [address] [phone]

## 2014-03-22 NOTE — Patient Instructions (Signed)
Insomnia Insomnia is frequent trouble falling and/or staying asleep. Insomnia can be a long term problem or a short term problem. Both are common. Insomnia can be a short term problem when the wakefulness is related to a certain stress or worry. Long term insomnia is often related to ongoing stress during waking hours and/or poor sleeping habits. Overtime, sleep deprivation itself can make the problem worse. Every little thing feels more severe because you are overtired and your ability to cope is decreased. CAUSES   Stress, anxiety, and depression.  Poor sleeping habits.  Distractions such as TV in the bedroom.  Naps close to bedtime.  Engaging in emotionally charged conversations before bed.  Technical reading before sleep.  Alcohol and other sedatives. They may make the problem worse. They can hurt normal sleep patterns and normal dream activity.  Stimulants such as caffeine for several hours prior to bedtime.  Pain syndromes and shortness of breath can cause insomnia.  Exercise late at night.  Changing time zones may cause sleeping problems (jet lag). It is sometimes helpful to have someone observe your sleeping patterns. They should look for periods of not breathing during the night (sleep apnea). They should also look to see how long those periods last. If you live alone or observers are uncertain, you can also be observed at a sleep clinic where your sleep patterns will be professionally monitored. Sleep apnea requires a checkup and treatment. Give your caregivers your medical history. Give your caregivers observations your family has made about your sleep.  SYMPTOMS   Not feeling rested in the morning.  Anxiety and restlessness at bedtime.  Difficulty falling and staying asleep. TREATMENT   Your caregiver may prescribe treatment for an underlying medical disorders. Your caregiver can give advice or help if you are using alcohol or other drugs for self-medication. Treatment  of underlying problems will usually eliminate insomnia problems.  Medications can be prescribed for short time use. They are generally not recommended for lengthy use.  Over-the-counter sleep medicines are not recommended for lengthy use. They can be habit forming.  You can promote easier sleeping by making lifestyle changes such as:  Using relaxation techniques that help with breathing and reduce muscle tension.  Exercising earlier in the day.  Changing your diet and the time of your last meal. No night time snacks.  Establish a regular time to go to bed.  Counseling can help with stressful problems and worry.  Soothing music and white noise may be helpful if there are background noises you cannot remove.  Stop tedious detailed work at least one hour before bedtime. HOME CARE INSTRUCTIONS   Keep a diary. Inform your caregiver about your progress. This includes any medication side effects. See your caregiver regularly. Take note of:  Times when you are asleep.  Times when you are awake during the night.  The quality of your sleep.  How you feel the next day. This information will help your caregiver care for you.  Get out of bed if you are still awake after 15 minutes. Read or do some quiet activity. Keep the lights down. Wait until you feel sleepy and go back to bed.  Keep regular sleeping and waking hours. Avoid naps.  Exercise regularly.  Avoid distractions at bedtime. Distractions include watching television or engaging in any intense or detailed activity like attempting to balance the household checkbook.  Develop a bedtime ritual. Keep a familiar routine of bathing, brushing your teeth, climbing into bed at the same   time each night, listening to soothing music. Routines increase the success of falling to sleep faster.  Use relaxation techniques. This can be using breathing and muscle tension release routines. It can also include visualizing peaceful scenes. You can  also help control troubling or intruding thoughts by keeping your mind occupied with boring or repetitive thoughts like the old concept of counting sheep. You can make it more creative like imagining planting one beautiful flower after another in your backyard garden.  During your day, work to eliminate stress. When this is not possible use some of the previous suggestions to help reduce the anxiety that accompanies stressful situations. MAKE SURE YOU:   Understand these instructions.  Will watch your condition.  Will get help right away if you are not doing well or get worse. Document Released: 02/20/2000 Document Revised: 05/17/2011 Document Reviewed: 03/22/2007 ExitCare Patient Information 2015 [employer]. This information is not intended to replace advice given to you by your health care provider. Make sure you discuss any questions you have with your health care provider.  

## 2014-07-23 ENCOUNTER — Ambulatory Visit (INDEPENDENT_AMBULATORY_CARE_PROVIDER_SITE_OTHER): Payer: Medicare Other | Admitting: Physical Therapy

## 2014-07-23 ENCOUNTER — Encounter: Payer: Self-pay | Admitting: Physical Therapy

## 2014-07-23 DIAGNOSIS — M25562 Pain in left knee: Secondary | ICD-10-CM | POA: Diagnosis present

## 2014-07-23 DIAGNOSIS — R531 Weakness: Secondary | ICD-10-CM

## 2014-07-23 DIAGNOSIS — R269 Unspecified abnormalities of gait and mobility: Secondary | ICD-10-CM | POA: Diagnosis not present

## 2014-07-23 NOTE — Therapy (Signed)
[place], [name], [address] Phone: [phone]   Fax:  [phone]  Physical Therapy Evaluation  Patient Details  Name: Melinda [name] MRN: [id number] Date of Birth: [birth year] Referring Provider:  Leandrew Koyanagi, MD  Encounter Date: 07/23/2014      PT End of Session - 07/23/14 1159    Visit Number 1   Number of Visits 8   Date for PT Re-Evaluation 08/20/14   PT Start Time 1105   PT Stop Time 1148   PT Time Calculation (min) 43 min   Activity Tolerance Patient limited by fatigue      Past Medical History  Diagnosis Date  . Hypercholesterolemia   . Acid reflux   . Obstructive sleep apnea   . Depression   . Insomnia   . Premature atrial contractions   . Anxiety   . Hypertension   . Cataract     surgery,bilateral  . Pyloric antral stenosis     in childhood,infancy  . Multifactorial gait disorder   . Sleep apnea with use of continuous positive airway pressure (CPAP) 10/19/2012    Past Surgical History  Procedure Laterality Date  . Abdominal hysterectomy  1970  . Transthoracic echocardiogram  07/23/2009    mild concentric LVH/mild mitral insufficiency/ moderate tricuspid innsufficiency/mild pulmonary HTN/EF- 55-60%  . Nm myoview ltd  11/30/2007    normal stress nuclear study/EF-83%  . Vaginal delivery    . Inner ear surgery Left     There were no vitals filed for this visit.  Visit Diagnosis:  Pain in knee joint, left - Plan: PT plan of care cert/re-cert  Weakness generalized - Plan: PT plan of care cert/re-cert  Abnormality of gait - Plan: PT plan of care cert/re-cert      Subjective Assessment - 07/23/14 1112    Subjective Pt reports Lt knee pain started about 18 months ago, she staggers and falls alot and this is the knee she tends to land on.    Pertinent History Has had one injection at her MD visit and this has helped about 85%    How long can you sit comfortably?  not a problem   How long can you stand comfortably? 1 hr   How long can you walk comfortably? can make it through the grocery store, difficulty with managing stairs, having to take one at a time.    Diagnostic tests x-rays show degnerative changes and loss of cartilage.     Patient Stated Goals decrease pain so she can do what she wants to do, transition floor to stand without difficulty when working in the yard   Currently in Pain? No/denies  no pain at rest, does go up to 7-8/10 on stairs            [facility] PT Assessment - 07/23/14 0001    Assessment   Medical Diagnosis Lt knee OA   Onset Date 07/22/12   Next MD Visit 08/21/14   Prior Therapy not for her knee, yes for balance -didn't think it helped   Precautions   Precautions None   Balance Screen   Has the patient fallen in the past 6 months Yes   How many times? 3  not sure of the cause, will sometimes catch her toes.    Has the patient had a decrease in activity level because of a fear of falling?  No  doesnt' feel the falls coming on   Is the patient reluctant  to leave their home because of a fear of falling?  No   Home Environment   Living Enviornment Private residence   Living Arrangements Alone  has a basement, has fallen on these stairs   Prior Function   Level of Independence --  independent   Leisure go to the beach in Sept has 3 flights of stairs  first thing in AM stretches her low back, used to travel    Observation/Other Assessments   Focus on Therapeutic Outcomes (FOTO)  58% limited   Posture/Postural Control   Posture/Postural Control No significant limitations   Posture Comments squats with bilat knee adduction and unstable.    ROM / Strength   AROM / PROM / Strength AROM;Strength   AROM   Overall AROM Comments good hamstring flexibility   AROM Assessment Site Knee   Right/Left Knee --  bilat knee flexion 130 degrees, full extension    Strength   Strength Assessment Site Hip;Knee;Ankle   Right/Left  Hip Right;Left   Right Hip Flexion 5/5   Right Hip Extension 4/5   Right Hip ABduction 4/5   Left Hip Flexion 5/5   Left Hip Extension 4/5   Left Hip ABduction 4-/5   Right/Left Knee Left  Rt WNL   Left Knee Flexion --  5-/5   Left Knee Extension 4+/5   Right/Left Ankle Left  Rt WNL   Left Ankle Dorsiflexion 5/5   Left Ankle Plantar Flexion 4+/5   Left Ankle Inversion 5/5   Left Ankle Eversion 4+/5   Palpation   Palpation no tenderness in her Lt knee   Ambulation/Gait   Ambulation/Gait --  antalgic gait, decreased stance on Lt LE   6 Minute Walk- Baseline   6 Minute Walk- Baseline --  SLS Rt 15 sec, Lt 12sec   Balance   Balance Assessed Yes  SLS Rt 15 sec, Lt 12sec                   [facility] Adult PT Treatment/Exercise - 07/23/14 0001    Exercises   Exercises Knee/Hip   Knee/Hip Exercises: Supine   Straight Leg Raise with External Rotation Strengthening;Right;Left;1 set  10 reps Lt, 5 reps Rt    Straight Leg Raise with External Rotation Limitations reduced reps due to pain in Rt groin   Knee/Hip Exercises: Sidelying   Hip ABduction Strengthening;Right;Left;1 set  10 reps Lt, 5 reps Rt    Hip ABduction Limitations limited reps on Rt due to pain   Knee/Hip Exercises: Prone   Hip Extension Strengthening;Right;Left;1 set;10 reps                PT Education - 07/23/14 1143    Education provided Yes   Education Details HEP   Person(s) Educated Patient   Methods Handout;Explanation   Comprehension Verbalized understanding;Tactile cues required;Returned demonstration             PT Long Term Goals - 07/23/14 1146    PT LONG TERM GOAL #1   Title I with advanced HEP   Time 4   Period Weeks   Status New   PT LONG TERM GOAL #2   Title increase hip strength bilat =/> 5-/5 to allow her to transfer floor to stand without difficulty   Time 4   Period Weeks   Status New   PT LONG TERM GOAL #3   Title increase knee strength =/> 5-/5 to allow good  eccentric control with stairs   Time 4  Period Weeks   Status New   PT LONG TERM GOAL #4   Title improve FOTO =/< CK level ( 48% limited)    Time 4   Period Weeks   Status New   PT LONG TERM GOAL #5   Title report no falls in 2 weeks   Time 4   Period Weeks   Status New               Plan - 07/23/14 1150    Clinical Impression Statement 81 y/o female presents with h/o Lt knee pain. She had an injection last week and this has relieved some of her pain.  She is fearful of falls as she has had 3 in the last 6 months.  Pt has significant hip weakness bilaterally and some acessory motion with single leg stance activity.  She wishes to have decreased pain and transfer floor to stand easier to allow her to work in the yard.  Pt also reports h/o back issues that interfer with her function at times.    Pt will benefit from skilled therapeutic intervention in order to improve on the following deficits Decreased strength;Pain;Abnormal gait   Rehab Potential Excellent   PT Frequency 2x / week   PT Duration 4 weeks   PT Treatment/Interventions Moist Heat;Patient/family education;Therapeutic exercise;Ultrasound;Gait training;Balance training;Manual techniques;Stair training;Cryotherapy;Electrical Stimulation   PT Next Visit Plan progress strengthening and try proprioception ex   Consulted and Agree with Plan of Care Patient         Problem List Patient Active Problem List   Diagnosis Date Noted  . Insomnia with sleep apnea 03/22/2014  . Adjustment disorder with mixed anxiety and depressed mood 03/22/2014  . Sleep apnea with use of continuous positive airway pressure (CPAP) 10/19/2012  . Multifactorial gait disorder     Jeral Pinch, PT 07/23/2014, 12:06 PM  [facility] [place], [name], [address] Phone: [phone]   Fax:  [phone]

## 2014-07-23 NOTE — Patient Instructions (Signed)
Hip Extension (Prone)   Lift left leg _2_ inches from floor, keeping knee locked. Repeat __10__ times per set. Do ____ sets per session. Do _1___ sessions per day.  Strengthening: Hip Abduction (Side2-Lying)   Tighten muscles on front of left thigh, then lift leg _6-8___ inches from surface, keeping knee locked.  Repeat _10___ times per set. Do _2___ sets per session. Do __1__ sessions per day.  Straight Leg Raise: With External Leg Rotation   Lie on back with right leg straight, opposite leg bent. Rotate straight leg out and lift __8__ inches. Repeat _10___ times per set. Do __2__ sets per session. Do _1___ sessions per day.   [facility] at [facility] [place], [place] [address]  [phone] (office) [phone] (fax)

## 2014-07-25 ENCOUNTER — Ambulatory Visit (INDEPENDENT_AMBULATORY_CARE_PROVIDER_SITE_OTHER): Payer: Medicare Other | Admitting: Rehabilitative and Restorative Service Providers"

## 2014-07-25 ENCOUNTER — Encounter: Payer: Self-pay | Admitting: Rehabilitative and Restorative Service Providers"

## 2014-07-25 DIAGNOSIS — R269 Unspecified abnormalities of gait and mobility: Secondary | ICD-10-CM | POA: Diagnosis not present

## 2014-07-25 DIAGNOSIS — R531 Weakness: Secondary | ICD-10-CM | POA: Diagnosis not present

## 2014-07-25 DIAGNOSIS — M25562 Pain in left knee: Secondary | ICD-10-CM | POA: Diagnosis present

## 2014-07-25 NOTE — Patient Instructions (Signed)
Bridging   [place] [phone]   Slowly raise buttocks from floor, keeping stomach tight. Repeat 10 times per set. Do 1-2____ sets per session. Do _1___ sessions per day.  http://orth.exer.us/1096   Copyright  VHI. All rights reserved.  Isometric Gluteals DO THIS ONE ON YOUR STOMACH   Tighten buttock muscles. Repeat ___10_ times per set. Do _1-2___ sets per session. Do __1-2__ sessions per day.  http://orth.exer.us/1126   Copyright  VHI. All rights reserved.  Strengthening: Hip Adduction - Isometric PREFORM LYING ON YOUR BACK   With ball or folded pillow between knees, squeeze knees together. Hold _5___ seconds. Repeat _10___ times per set. Do _1-2___ sets per session. Do __1__ sessions per day.  http://orth.exer.us/612   Copyright  VHI. All rights reserved.

## 2014-07-25 NOTE — Therapy (Signed)
[place], [name], [address] Phone: [phone]   Fax:  [phone]  Physical Therapy Treatment  Patient Details  Name: Melinda [name] MRN: [id number] Date of Birth: April 10, 1931 Referring Provider:  Leandrew Koyanagi, MD  Encounter Date: 07/25/2014      PT End of Session - 07/25/14 0850    Visit Number 2   Number of Visits 8   Date for PT Re-Evaluation 08/20/14   PT Start Time 0851   PT Stop Time 0937   PT Time Calculation (min) 46 min      Past Medical History  Diagnosis Date  . Hypercholesterolemia   . Acid reflux   . Obstructive sleep apnea   . Depression   . Insomnia   . Premature atrial contractions   . Anxiety   . Hypertension   . Cataract     surgery,bilateral  . Pyloric antral stenosis     in childhood,infancy  . Multifactorial gait disorder   . Sleep apnea with use of continuous positive airway pressure (CPAP) 10/19/2012    Past Surgical History  Procedure Laterality Date  . Abdominal hysterectomy  1970  . Transthoracic echocardiogram  07/23/2009    mild concentric LVH/mild mitral insufficiency/ moderate tricuspid innsufficiency/mild pulmonary HTN/EF- 55-60%  . Nm myoview ltd  11/30/2007    normal stress nuclear study/EF-83%  . Vaginal delivery    . Inner ear surgery Left     There were no vitals filed for this visit.  Visit Diagnosis:  Pain in knee joint, left  Weakness generalized  Abnormality of gait      Subjective Assessment - 07/25/14 0858    Subjective R hip is sore and hurting - had an injectioin last week. Sore from exercises. Some pain in the L knee with nustep   Currently in Pain? Yes   Pain Score 5    Pain Location Knee   Pain Orientation Left   Pain Descriptors / Indicators Dull   Pain Type Chronic pain   Pain Onset More than a month ago   Pain Frequency Intermittent   Multiple Pain Sites Yes   Pain Score 8   Pain Location Hip   Pain  Orientation Right   Pain Type Chronic pain   Pain Onset More than a month ago   Pain Frequency Intermittent                         [facility] Adult PT Treatment/Exercise - 07/25/14 0001    Exercises   Exercises Knee/Hip   Knee/Hip Exercises: Aerobic   Stationary Bike NuStep L3 x5'   Knee/Hip Exercises: Supine   Hip Adduction Isometric 10 reps   Bridges 10 reps   Straight Leg Raise with External Rotation 10 reps   Knee/Hip Exercises: Sidelying   Hip ABduction 10 reps   Knee/Hip Exercises: Prone   Straight Leg Raises 10 reps   Other Prone Exercises Glut sets x10 5 " hold   Manual Therapy   Manual therapy comments Tightness noted ant R hip - manual work to hip flexors f/b myofacial ball release                 PT Education - 07/25/14 0939    Education provided Yes   Education Details HEP Myofacial ball release with ball and rolling pin   Person(s) Educated Patient   Methods Explanation;Demonstration;Tactile cues;Verbal cues;Handout   Comprehension Verbalized understanding;Returned demonstration  PT Long Term Goals - 07/23/14 1146    PT LONG TERM GOAL #1   Title I with advanced HEP   Time 4   Period Weeks   Status New   PT LONG TERM GOAL #2   Title increase hip strength bilat =/> 5-/5 to allow her to transfer floor to stand without difficulty   Time 4   Period Weeks   Status New   PT LONG TERM GOAL #3   Title increase knee strength =/> 5-/5 to allow good eccentric control with stairs   Time 4   Period Weeks   Status New   PT LONG TERM GOAL #4   Title improve FOTO =/< CK level ( 48% limited)    Time 4   Period Weeks   Status New   PT LONG TERM GOAL #5   Title report no falls in 2 weeks   Time 4   Period Weeks   Status New               Plan - 07/25/14 0569    Clinical Impression Statement Patinet is sore from starting exercises - has increased pain R ant hip which is recurrent in nature. Progressed exercises for  hip/nee stability   Consulted and Agree with Plan of Care Patient        Problem List Patient Active Problem List   Diagnosis Date Noted  . Insomnia with sleep apnea 03/22/2014  . Adjustment disorder with mixed anxiety and depressed mood 03/22/2014  . Sleep apnea with use of continuous positive airway pressure (CPAP) 10/19/2012  . Multifactorial gait disorder     Tashica Provencio Nilda Simmer, PT, MPH 07/25/2014, 9:46 AM  [facility] [place], [name], [address] Phone: [phone]   Fax:  [phone]

## 2014-07-29 ENCOUNTER — Encounter: Payer: Medicare Other | Admitting: Physical Therapy

## 2014-08-01 ENCOUNTER — Ambulatory Visit (INDEPENDENT_AMBULATORY_CARE_PROVIDER_SITE_OTHER): Payer: Medicare Other | Admitting: Rehabilitative and Restorative Service Providers"

## 2014-08-01 ENCOUNTER — Encounter: Payer: Self-pay | Admitting: Rehabilitative and Restorative Service Providers"

## 2014-08-01 DIAGNOSIS — M25562 Pain in left knee: Secondary | ICD-10-CM

## 2014-08-01 DIAGNOSIS — R269 Unspecified abnormalities of gait and mobility: Secondary | ICD-10-CM

## 2014-08-01 DIAGNOSIS — R531 Weakness: Secondary | ICD-10-CM

## 2014-08-01 NOTE — Patient Instructions (Signed)
KNEE: Extension, Short Arc Quads - Supine   Place bolster under knees. Raise one leg until knee is straight. _10__ reps per set, 2___ sets per day, __1_ days per week Use towel rolled tightly and placed under thigh to rest leg on for exercise. Can bend opposite knee for back comfort.  This exercise can be substituted for straight leg raise.  d.  Wall Slide   Keep head, shoulders, and back against wall, with feet out in front and slightly wider than shoulder width. Slowly lower buttocks by sliding down wall until knees are slightly bent. Do not let knees come out in front of toes. DO NOT BEND AS FAR DOWN AS THIS PICTURE!!! Keep back flat. Push with legs to straighten back up. Repeat __5-10__ times per set. Do _1-2___ sets per session. Do __1-2__ sessions per day.  http://orth.exer.us/152   Copyright  VHI. All rights reserved.

## 2014-08-01 NOTE — Therapy (Signed)
[place], [name], [address] Phone: [phone]   Fax:  [phone]  Physical Therapy Treatment  Patient Details  Name: Melinda [name] MRN: [id number] Date of Birth: 10-08-1931 Referring Provider:  Leandrew Koyanagi, MD  Encounter Date: 08/01/2014      PT End of Session - 08/01/14 1107    Visit Number 3   Number of Visits 18   Date for PT Re-Evaluation 09/26/14   PT Start Time 1107      Past Medical History  Diagnosis Date  . Hypercholesterolemia   . Acid reflux   . Obstructive sleep apnea   . Depression   . Insomnia   . Premature atrial contractions   . Anxiety   . Hypertension   . Cataract     surgery,bilateral  . Pyloric antral stenosis     in childhood,infancy  . Multifactorial gait disorder   . Sleep apnea with use of continuous positive airway pressure (CPAP) 10/19/2012    Past Surgical History  Procedure Laterality Date  . Abdominal hysterectomy  1970  . Transthoracic echocardiogram  07/23/2009    mild concentric LVH/mild mitral insufficiency/ moderate tricuspid innsufficiency/mild pulmonary HTN/EF- 55-60%  . Nm myoview ltd  11/30/2007    normal stress nuclear study/EF-83%  . Vaginal delivery    . Inner ear surgery Left     There were no vitals filed for this visit.  Visit Diagnosis:  Pain in knee joint, left  Weakness generalized  Abnormality of gait      Subjective Assessment - 08/01/14 1112    Subjective R hip is doing better but still hurts when she does the exercise where she lifts the R LE up - some discomfort in L knee with nustep but not bad   How long can you sit comfortably? not a problem   How long can you stand comfortably? 1 hr   How long can you walk comfortably? can make it through the grocery store, difficulty with managing stairs, having to take one at a time.    Currently in Pain? Yes   Pain Score 5    Pain Location Knee   Pain Orientation Left    Pain Descriptors / Indicators Burning;Sharp   Pain Type Chronic pain   Pain Onset More than a month ago   Multiple Pain Sites No                         [facility] Adult PT Treatment/Exercise - 08/01/14 0001    Exercises   Exercises Knee/Hip   Knee/Hip Exercises: Aerobic   Stationary Bike NuStep L3 x5'   Knee/Hip Exercises: Supine   Short Arc Quad Sets Strengthening;Both;2 sets;10 reps;Other (comment)   Short Arc Quad Sets Limitations 0#, thigh resting on blue ball   Hip Adduction Isometric 10 reps  ball btn knnes   Bridges Strengthening;1 set;10 reps  ball between knees   Bridges Limitations 5 sec hold    Straight Leg Raise with External Rotation --  painful R hip - substituted Short arc quad   Knee/Hip Exercises: Prone   Straight Leg Raises AROM;Strengthening;Both;2 sets;10 reps;Other (comment)   Straight Leg Raises Limitations --  0#   Other Prone Exercises glut sets 10 reps 2 sets 5 sec hold                     PT Long Term Goals - 08/01/14 1324  PT LONG TERM GOAL #1   Title I with advanced HEP   Time 4   Period Weeks   Status Partially Met   PT LONG TERM GOAL #2   Title increase hip strength bilat =/> 5-/5 to allow her to transfer floor to stand without difficulty   Time 4   Period Weeks   Status On-going   PT LONG TERM GOAL #3   Title increase knee strength =/> 5-/5 to allow good eccentric control with stairs   Period Weeks   Status On-going   PT LONG TERM GOAL #4   Title improve FOTO =/< CK level ( 48% limited)    Time 4   Period Weeks   Status On-going               Plan - 08/01/14 1321    Clinical Impression Statement Continue to work on standing posture and alignment, weight bearing and weight shift to Lt LE in walking to improve gait; progress strengthening exercises - add hip/trunk extension back at wall and other exercises as indicated.   Pt will benefit from skilled therapeutic intervention in order to improve on  the following deficits Decreased strength;Pain;Abnormal gait   Rehab Potential Excellent   PT Frequency 2x / week   PT Duration 4 weeks   PT Treatment/Interventions Moist Heat;Patient/family education;Therapeutic exercise;Ultrasound;Gait training;Balance training;Manual techniques;Stair training;Cryotherapy;Electrical Stimulation   PT Next Visit Plan add standing trunk/hip extension at wall; progress strengthening and try proprioception ex; work on gait pattern   Consulted and Agree with Plan of Care Patient        Problem List Patient Active Problem List   Diagnosis Date Noted  . Insomnia with sleep apnea 03/22/2014  . Adjustment disorder with mixed anxiety and depressed mood 03/22/2014  . Sleep apnea with use of continuous positive airway pressure (CPAP) 10/19/2012  . Multifactorial gait disorder      Khilee Hendricksen Nilda Simmer, PT, MPH 08/01/2014, 1:27 PM  [facility] [place] [facility] [place] [place], [name], [address] Phone: [phone]   Fax:  [phone]

## 2014-08-06 ENCOUNTER — Ambulatory Visit (INDEPENDENT_AMBULATORY_CARE_PROVIDER_SITE_OTHER): Payer: Medicare Other | Admitting: Physical Therapy

## 2014-08-06 DIAGNOSIS — R269 Unspecified abnormalities of gait and mobility: Secondary | ICD-10-CM | POA: Diagnosis not present

## 2014-08-06 DIAGNOSIS — M25562 Pain in left knee: Secondary | ICD-10-CM | POA: Diagnosis present

## 2014-08-06 DIAGNOSIS — R531 Weakness: Secondary | ICD-10-CM

## 2014-08-06 NOTE — Therapy (Signed)
[place], [name], [address] Phone: [phone]   Fax:  [phone]  Physical Therapy Treatment  Patient Details  Name: Melinda [name] MRN: [id number] Date of Birth: [birth year] Referring Provider:  Leandrew Koyanagi, MD  Encounter Date: 08/06/2014      PT End of Session - 08/06/14 1105    Visit Number 4   Number of Visits 18   Date for PT Re-Evaluation 09/26/14   PT Start Time 2563   PT Stop Time 1145   PT Time Calculation (min) 40 min   Activity Tolerance Patient tolerated treatment well;No increased pain      Past Medical History  Diagnosis Date  . Hypercholesterolemia   . Acid reflux   . Obstructive sleep apnea   . Depression   . Insomnia   . Premature atrial contractions   . Anxiety   . Hypertension   . Cataract     surgery,bilateral  . Pyloric antral stenosis     in childhood,infancy  . Multifactorial gait disorder   . Sleep apnea with use of continuous positive airway pressure (CPAP) 10/19/2012    Past Surgical History  Procedure Laterality Date  . Abdominal hysterectomy  1970  . Transthoracic echocardiogram  07/23/2009    mild concentric LVH/mild mitral insufficiency/ moderate tricuspid innsufficiency/mild pulmonary HTN/EF- 55-60%  . Nm myoview ltd  11/30/2007    normal stress nuclear study/EF-83%  . Vaginal delivery    . Inner ear surgery Left     There were no vitals filed for this visit.  Visit Diagnosis:  Pain in knee joint, left  Weakness generalized  Abnormality of gait      Subjective Assessment - 08/06/14 1106    Subjective Pt reports she has been compliant with HEP.  Notes a 50% improvement in Lt knee and 50% worsening in Rt hip.    Currently in Pain? Yes   Pain Score 5    Pain Location Hip   Pain Orientation Right   Pain Descriptors / Indicators Sharp   Aggravating Factors  walking, weight bearing    Pain Relieving Factors resting              [facility] PT Assessment - 08/06/14 0001    Assessment   Medical Diagnosis Lt knee OA   Onset Date/Surgical Date 07/22/12   Next MD Visit 08/21/14   Strength   Right/Left Knee Left   Left Knee Flexion 5/5   Left Knee Extension 5/5                     [facility] Adult PT Treatment/Exercise - 08/06/14 0001    Exercises   Exercises Knee/Hip   Knee/Hip Exercises: Aerobic   Stationary Bike NuStep L4: 3 min, L3: 2 min    Knee/Hip Exercises: Standing   Lateral Step Up Step Height: 6";Left;1 set;10 reps;Right   Forward Step Up Left;Hand Hold: 2;Step Height: 6";10 reps;1 set   Forward Step Up Limitations VC for controlled descent   Knee/Hip Exercises: Supine   Bridges 10 reps  with ball squeeze, 5 sec hold in extension   Straight Leg Raises Strengthening;Right;Left;2 sets;5 reps   Straight Leg Raise with External Rotation Left;10 reps  Rt side 5 reps due to pain   Knee/Hip Exercises: Sidelying   Clams 10 reps x 2 sets each leg                PT Education - 08/06/14 1248  Education provided Yes   Education Details HEP- changed hip abd to clam shell and added forward step ups.    Person(s) Educated Patient   Methods Explanation;Demonstration  Pt declined handout   Comprehension Verbalized understanding;Returned demonstration             PT Long Term Goals - 08/01/14 1324    PT LONG TERM GOAL #1   Title I with advanced HEP   Time 4   Period Weeks   Status Partially Met   PT LONG TERM GOAL #2   Title increase hip strength bilat =/> 5-/5 to allow her to transfer floor to stand without difficulty   Time 4   Period Weeks   Status On-going   PT LONG TERM GOAL #3   Title increase knee strength =/> 5-/5 to allow good eccentric control with stairs   Period Weeks   Status On-going   PT LONG TERM GOAL #4   Title improve FOTO =/< CK level ( 48% limited)    Time 4   Period Weeks   Status On-going               Plan - 08/06/14 1145    Clinical  Impression Statement Pt demo improved Lt knee extension strength. Pt tolerated all exercises without increase in pain.  Pt noted decrease in Rt hip pain to 3/10 after therapeutic exercise; declined use of modalities for further pain reduction. Progressing towards goals.    Pt will benefit from skilled therapeutic intervention in order to improve on the following deficits Decreased strength;Pain;Abnormal gait   Rehab Potential Excellent   PT Frequency 2x / week   PT Duration 4 weeks   PT Treatment/Interventions Moist Heat;Patient/family education;Therapeutic exercise;Ultrasound;Gait training;Balance training;Manual techniques;Stair training;Cryotherapy;Electrical Stimulation   PT Next Visit Plan Work on transfers to/from floor with good body mechanics. Continue per POC.    Consulted and Agree with Plan of Care Patient        Problem List Patient Active Problem List   Diagnosis Date Noted  . Insomnia with sleep apnea 03/22/2014  . Adjustment disorder with mixed anxiety and depressed mood 03/22/2014  . Sleep apnea with use of continuous positive airway pressure (CPAP) 10/19/2012  . Multifactorial gait disorder    Kerin Perna, PTA 08/06/2014 12:50 PM  [facility] [place], [name], [address] Phone: [phone]   Fax:  [phone]

## 2014-08-08 ENCOUNTER — Ambulatory Visit (INDEPENDENT_AMBULATORY_CARE_PROVIDER_SITE_OTHER): Payer: Medicare Other | Admitting: Physical Therapy

## 2014-08-08 DIAGNOSIS — R531 Weakness: Secondary | ICD-10-CM

## 2014-08-08 DIAGNOSIS — M25562 Pain in left knee: Secondary | ICD-10-CM | POA: Diagnosis not present

## 2014-08-08 DIAGNOSIS — R269 Unspecified abnormalities of gait and mobility: Secondary | ICD-10-CM

## 2014-08-08 NOTE — Therapy (Signed)
[place], [name], [address] Phone: [phone]   Fax:  [phone]  Physical Therapy Treatment  Patient Details  Name: SETAREH ROM MRN: [id number] Date of Birth: [birth year] Referring Provider:  Leandrew Koyanagi, MD  Encounter Date: 08/08/2014      PT End of Session - 08/08/14 1021    Visit Number 5   Number of Visits 18   Date for PT Re-Evaluation 09/26/14   PT Start Time 1324   PT Stop Time 1104   PT Time Calculation (min) 49 min   Activity Tolerance Patient tolerated treatment well      Past Medical History  Diagnosis Date  . Hypercholesterolemia   . Acid reflux   . Obstructive sleep apnea   . Depression   . Insomnia   . Premature atrial contractions   . Anxiety   . Hypertension   . Cataract     surgery,bilateral  . Pyloric antral stenosis     in childhood,infancy  . Multifactorial gait disorder   . Sleep apnea with use of continuous positive airway pressure (CPAP) 10/19/2012    Past Surgical History  Procedure Laterality Date  . Abdominal hysterectomy  1970  . Transthoracic echocardiogram  07/23/2009    mild concentric LVH/mild mitral insufficiency/ moderate tricuspid innsufficiency/mild pulmonary HTN/EF- 55-60%  . Nm myoview ltd  11/30/2007    normal stress nuclear study/EF-83%  . Vaginal delivery    . Inner ear surgery Left     There were no vitals filed for this visit.  Visit Diagnosis:  Weakness generalized  Pain in knee joint, left  Abnormality of gait      Subjective Assessment - 08/08/14 1021    Subjective Pt reports she has been pruning bushes at home; tired from that (standing with hand clipper).    Currently in Pain? No/denies  up to 4/10 in Lt knee with NuStep.  Everyday activity up to 2/10.             [facility] PT Assessment - 08/08/14 0001    Assessment   Medical Diagnosis Lt knee OA   Onset Date/Surgical Date 07/22/12   Next MD Visit 08/21/14                      [facility] Adult PT Treatment/Exercise - 08/08/14 0001    Knee/Hip Exercises: Stretches   Hip Flexor Stretch 2 reps;30 seconds   Gastroc Stretch 2 reps;30 seconds  each leg   Knee/Hip Exercises: Aerobic   Stationary Bike NuStep L4: 4 min    Knee/Hip Exercises: Standing   Heel Raises 1 set;15 reps   Forward Lunges 1 set;10 reps;Right;Left  BUE support   Wall Squat 2 sets;10 reps   Other Standing Knee Exercises Lt/ Rt kneeling to standing (knee on pad) x 8 reps each side (UE support) -challenging;      Knee/Hip Exercises: Seated   Long Arc Quad Strengthening;Right;Left;10 reps;2 sets   Other Seated Knee Exercises seated marching x 20 steps x 2 sets   Knee/Hip Exercises: Supine   Other Supine Knee Exercises R hip flexor stretch with leg off table, Lt leg in hooklying 30 sec x 2; followed by Lt SKTC x 10 sec x 4 reps    Knee/Hip Exercises: Prone   Other Prone Exercises Prone to quadruped to high kneel to stand x 2 reps  PT Education - 08/08/14 1833    Education provided Yes   Education Details HEP   Person(s) Educated Patient   Methods Handout;Explanation   Comprehension Verbalized understanding;Returned demonstration             PT Long Term Goals - 08/01/14 1324    PT LONG TERM GOAL #1   Title I with advanced HEP   Time 4   Period Weeks   Status Partially Met   PT LONG TERM GOAL #2   Title increase hip strength bilat =/> 5-/5 to allow her to transfer floor to stand without difficulty   Time 4   Period Weeks   Status On-going   PT LONG TERM GOAL #3   Title increase knee strength =/> 5-/5 to allow good eccentric control with stairs   Period Weeks   Status On-going   PT LONG TERM GOAL #4   Title improve FOTO =/< CK level ( 48% limited)    Time 4   Period Weeks   Status On-going               Plan - 08/08/14 1828    Clinical Impression Statement Pt reported decreased hip pain with hip flexor stretch in  supine.  Pt tolerated all new exercises without c/o increased pain, except some mild Lt knee pain when using NuStep (resolved when motion stopped).  Pt progressing towards goals.    Pt will benefit from skilled therapeutic intervention in order to improve on the following deficits Decreased strength;Pain;Abnormal gait   Rehab Potential Excellent   PT Frequency 2x / week   PT Duration 4 weeks   PT Treatment/Interventions Moist Heat;Patient/family education;Therapeutic exercise;Ultrasound;Gait training;Balance training;Manual techniques;Stair training;Cryotherapy;Electrical Stimulation   PT Next Visit Plan Continue progressive strengthening for hip/knee.    Consulted and Agree with Plan of Care Patient        Problem List Patient Active Problem List   Diagnosis Date Noted  . Insomnia with sleep apnea 03/22/2014  . Adjustment disorder with mixed anxiety and depressed mood 03/22/2014  . Sleep apnea with use of continuous positive airway pressure (CPAP) 10/19/2012  . Multifactorial gait disorder    Kerin Perna, PTA 08/08/2014 6:34 PM  [place], [name], [address] Phone: [phone]   Fax:  [phone]

## 2014-08-08 NOTE — Patient Instructions (Addendum)
Seated Alternating Leg Raise (Marching)    Raise bent knee and return. Repeat with other leg. Do _2-3__ sets of _20__ repetitions.  Copyright  VHI. All rights reserved.  KNEE: Extension, Long Arc Quads - Sitting   Raise leg until knee is straight. _10__ reps per set, _2-3__ sets per day, _5__ days per week  Quads / HF, Supine   Lie near edge of bed, one leg bent, foot flat on bed. Other leg hanging over edge, relaxed, thigh resting entirely on bed. Bend hanging knee backward keeping thigh in contact with bed. Hold __30_ seconds.  Repeat _2__ times per session. Do _1-2__ sessions per day.   [facility] at [facility] [place], [place] [address]  [phone] (office) [phone] (fax)

## 2014-08-09 ENCOUNTER — Encounter: Payer: Medicare Other | Admitting: Physical Therapy

## 2014-08-12 ENCOUNTER — Ambulatory Visit (INDEPENDENT_AMBULATORY_CARE_PROVIDER_SITE_OTHER): Payer: Medicare Other | Admitting: Physical Therapy

## 2014-08-12 DIAGNOSIS — M25562 Pain in left knee: Secondary | ICD-10-CM | POA: Diagnosis not present

## 2014-08-12 DIAGNOSIS — R269 Unspecified abnormalities of gait and mobility: Secondary | ICD-10-CM

## 2014-08-12 DIAGNOSIS — R531 Weakness: Secondary | ICD-10-CM

## 2014-08-12 NOTE — Therapy (Signed)
[place], [name], [address] Phone: [phone]   Fax:  [phone]  Physical Therapy Treatment  Patient Details  Name: Melinda [name] MRN: [id number] Date of Birth: [birth year] Referring Provider:  Leandrew Koyanagi, MD  Encounter Date: 08/12/2014      PT End of Session - 08/12/14 0937    Visit Number 6   Number of Visits 18   Date for PT Re-Evaluation 09/26/14   PT Start Time 0930   PT Stop Time 1017   PT Time Calculation (min) 47 min      Past Medical History  Diagnosis Date  . Hypercholesterolemia   . Acid reflux   . Obstructive sleep apnea   . Depression   . Insomnia   . Premature atrial contractions   . Anxiety   . Hypertension   . Cataract     surgery,bilateral  . Pyloric antral stenosis     in childhood,infancy  . Multifactorial gait disorder   . Sleep apnea with use of continuous positive airway pressure (CPAP) 10/19/2012    Past Surgical History  Procedure Laterality Date  . Abdominal hysterectomy  1970  . Transthoracic echocardiogram  07/23/2009    mild concentric LVH/mild mitral insufficiency/ moderate tricuspid innsufficiency/mild pulmonary HTN/EF- 55-60%  . Nm myoview ltd  11/30/2007    normal stress nuclear study/EF-83%  . Vaginal delivery    . Inner ear surgery Left     There were no vitals filed for this visit.  Visit Diagnosis:  Weakness generalized  Pain in knee joint, left  Abnormality of gait      Subjective Assessment - 08/12/14 0938    Subjective Pt reports her Lt knee has "gotten much better".  Pt reports she feels she'll just have to get use to Rt hip pain.    Currently in Pain? No/denies                         [facility] Adult PT Treatment/Exercise - 08/12/14 0001    Knee/Hip Exercises: Stretches   Passive Hamstring Stretch 2 reps;30 seconds   Passive Hamstring Stretch Limitations cues not to bounce and hold stretch   Quad  Stretch 2 reps;20 seconds   Gastroc Stretch 1 rep;30 seconds  each side    Knee/Hip Exercises: Aerobic   Stationary Bike NuStep L3: 6 min   Knee/Hip Exercises: Standing   Heel Raises 2 sets;10 reps   Forward Lunges 1 set;10 reps;Right;Left  BUE support   Side Lunges Right;Left;1 set;10 reps   Knee/Hip Exercises: Seated   Long Arc Quad Strengthening;Right;Left;2 sets;10 reps;Weights   Long Arc Quad Weight 2 lbs.   Other Seated Knee Exercises seated marching x 20 steps x 2 sets   Knee/Hip Exercises: Supine   Bridges 10 reps  with ball squeeze, 5 sec hold in extension   Straight Leg Raises Right;Left;10 reps   Straight Leg Raise with External Rotation Right;Left;5 reps   Other Supine Knee Exercises resisted clam shell in hooklying with green band x 10 reps x 2 sets                      PT Long Term Goals - 08/01/14 1324    PT LONG TERM GOAL #1   Title I with advanced HEP   Time 4   Period Weeks   Status Partially Met   PT LONG TERM GOAL #2   Title increase hip  strength bilat =/> 5-/5 to allow her to transfer floor to stand without difficulty   Time 4   Period Weeks   Status On-going   PT LONG TERM GOAL #3   Title increase knee strength =/> 5-/5 to allow good eccentric control with stairs   Period Weeks   Status On-going   PT LONG TERM GOAL #4   Title improve FOTO =/< CK level ( 48% limited)    Time 4   Period Weeks   Status On-going               Plan - 08/12/14 1426    Clinical Impression Statement Pt tolerated all new exercises without increased pain. Pt noted decreased stiffness in Rt thigh/Lt knee after quad stretch.  Pt noting decrease in Lt knee pain since beginning therapy.  Progressing towards goals.    Pt will benefit from skilled therapeutic intervention in order to improve on the following deficits Decreased strength;Pain;Abnormal gait   Rehab Potential Excellent   PT Frequency 2x / week   PT Duration 4 weeks   PT Treatment/Interventions  Moist Heat;Patient/family education;Therapeutic exercise;Ultrasound;Gait training;Balance training;Manual techniques;Stair training;Cryotherapy;Electrical Stimulation   PT Next Visit Plan Continue progressive strengthening for hip/knee.         Problem List Patient Active Problem List   Diagnosis Date Noted  . Insomnia with sleep apnea 03/22/2014  . Adjustment disorder with mixed anxiety and depressed mood 03/22/2014  . Sleep apnea with use of continuous positive airway pressure (CPAP) 10/19/2012  . Multifactorial gait disorder     Kerin Perna, PTA Jul 28, 202016 2:30 PM  [place], [name], [address] Phone: [phone]   Fax:  [phone]

## 2014-08-12 NOTE — Patient Instructions (Addendum)
KNEE: Quadriceps - Prone   Place strap around ankle. Bring ankle toward buttocks. Press hip into surface. Hold _20-30__ seconds. _2__ reps per set, __1_ sets per day, _6 days per week  Abduction: Clam (Eccentric) - Side-Lying   Lie on side with knees bent. Lift top knee, keeping feet together. Keep trunk steady. Slowly lower for 3-5 seconds. _10__ reps per set, 2 sets,  __1_ sets per day, __4_ days per week. Add green resistance band if this is too easy.   [facility] at [facility] [place], [place] [address]  [phone] (office) [phone] (fax)

## 2014-08-15 ENCOUNTER — Ambulatory Visit (INDEPENDENT_AMBULATORY_CARE_PROVIDER_SITE_OTHER): Payer: Medicare Other | Admitting: Physical Therapy

## 2014-08-15 DIAGNOSIS — R531 Weakness: Secondary | ICD-10-CM

## 2014-08-15 DIAGNOSIS — M25562 Pain in left knee: Secondary | ICD-10-CM | POA: Diagnosis not present

## 2014-08-15 DIAGNOSIS — R269 Unspecified abnormalities of gait and mobility: Secondary | ICD-10-CM | POA: Diagnosis not present

## 2014-08-15 NOTE — Therapy (Signed)
[place], [name], [address] Phone: [phone]   Fax:  [phone]  Physical Therapy Treatment  Patient Details  Name: Melinda [name] MRN: [id number] Date of Birth: 02-Oct-1931 Referring Provider:  Leandrew Koyanagi, MD  Encounter Date: 08/15/2014      PT End of Session - 08/15/14 1102    Visit Number 7   Number of Visits 18   Date for PT Re-Evaluation 09/26/14   PT Start Time 1100   PT Stop Time 1144   PT Time Calculation (min) 44 min      Past Medical History  Diagnosis Date  . Hypercholesterolemia   . Acid reflux   . Obstructive sleep apnea   . Depression   . Insomnia   . Premature atrial contractions   . Anxiety   . Hypertension   . Cataract     surgery,bilateral  . Pyloric antral stenosis     in childhood,infancy  . Multifactorial gait disorder   . Sleep apnea with use of continuous positive airway pressure (CPAP) 10/19/2012    Past Surgical History  Procedure Laterality Date  . Abdominal hysterectomy  1970  . Transthoracic echocardiogram  07/23/2009    mild concentric LVH/mild mitral insufficiency/ moderate tricuspid innsufficiency/mild pulmonary HTN/EF- 55-60%  . Nm myoview ltd  11/30/2007    normal stress nuclear study/EF-83%  . Vaginal delivery    . Inner ear surgery Left     There were no vitals filed for this visit.  Visit Diagnosis:  Weakness generalized  Pain in knee joint, left  Abnormality of gait      Subjective Assessment - 08/15/14 1103    Subjective Pt reports she was sore in R buttocks the evening of last session.  Resolved with rest.    Currently in Pain? No/denies  with 3-4/10 during NuStep.             [facility] PT Assessment - 08/15/14 0001    Assessment   Medical Diagnosis Lt knee OA   Onset Date/Surgical Date 07/22/12   Next MD Visit 08/21/14   Strength   Strength Assessment Site Hip;Knee   Right/Left Hip Right;Left   Right Hip Flexion  4/5  with groin pain    Right Hip Extension 4+/5   Right Hip ABduction --  5-/5   Left Hip Flexion 5/5   Left Hip Extension 4/5   Left Hip ABduction 4/5   Right/Left Knee Right;Left   Right Knee Flexion 5/5   Right Knee Extension 5/5   Left Knee Flexion 5/5   Left Knee Extension 5/5                     [facility] Adult PT Treatment/Exercise - 08/15/14 0001    Knee/Hip Exercises: Stretches   Passive Hamstring Stretch 2 reps;20 seconds  seated    Quad Stretch 2 reps;20 seconds  each leg    Gastroc Stretch 2 reps;20 seconds  each leg    Knee/Hip Exercises: Aerobic   Stationary Bike NuStep L3: 6 min   Knee/Hip Exercises: Standing   Heel Raises 2 sets;10 reps   Forward Lunges 1 set;10 reps;Right;Left  BUE support   Step Down Left;10 reps;2 sets;Hand Hold: 2;Step Height: 6"  (and backward step up) and 5 reps on RLE   Knee/Hip Exercises: Supine   Other Supine Knee Exercises resisted clam shell in hooklying with green band x 10 reps -1 set   Knee/Hip Exercises: Prone  Hip Extension Left;Right;10 reps;2 sets                PT Education - 08/15/14 1135    Education provided Yes   Education Details HEP    Person(s) Educated Patient   Methods Explanation;Handout   Comprehension Verbalized understanding;Returned demonstration             PT Long Term Goals - 08/01/14 1324    PT LONG TERM GOAL #1   Title I with advanced HEP   Time 4   Period Weeks   Status Partially Met   PT LONG TERM GOAL #2   Title increase hip strength bilat =/> 5-/5 to allow her to transfer floor to stand without difficulty   Time 4   Period Weeks   Status On-going   PT LONG TERM GOAL #3   Title increase knee strength =/> 5-/5 to allow good eccentric control with stairs   Period Weeks   Status On-going   PT LONG TERM GOAL #4   Title improve FOTO =/< CK level ( 48% limited)    Time 4   Period Weeks   Status On-going               Plan - 08/15/14 1117    Clinical  Impression Statement Pt demo improved strength in R/L hip; however continues with weakness in Lt hip ext/abd and Rt hip flexion. Pt making gains towards goals; pt has met LTG #3.    Pt will benefit from skilled therapeutic intervention in order to improve on the following deficits Decreased strength;Pain;Abnormal gait   Rehab Potential Excellent   PT Frequency 2x / week   PT Duration 4 weeks   PT Treatment/Interventions Moist Heat;Patient/family education;Therapeutic exercise;Ultrasound;Gait training;Balance training;Manual techniques;Stair training;Cryotherapy;Electrical Stimulation   PT Next Visit Plan Continue progressive strengthening for hip/knee.  Write MD note for upcoming appt.    Consulted and Agree with Plan of Care Patient        Problem List Patient Active Problem List   Diagnosis Date Noted  . Insomnia with sleep apnea 03/22/2014  . Adjustment disorder with mixed anxiety and depressed mood 03/22/2014  . Sleep apnea with use of continuous positive airway pressure (CPAP) 10/19/2012  . Multifactorial gait disorder     Kerin Perna, PTA 08/15/2014 12:38 PM  [facility] [place], [name], [address] Phone: [phone]   Fax:  [phone]

## 2014-08-15 NOTE — Patient Instructions (Signed)
Hip Extension   Lying face down, raise leg just off floor. Keep leg straight. Hold 1 count. Lower leg to floor. Repeat _10___ times Left  Leg. 2 sets.  Optional: Place small pillow under abdomen.   [facility] at [facility] [place], [place] [address]  [phone] (office) [phone] (fax)

## 2014-08-19 ENCOUNTER — Ambulatory Visit (INDEPENDENT_AMBULATORY_CARE_PROVIDER_SITE_OTHER): Payer: Medicare Other | Admitting: Rehabilitative and Restorative Service Providers"

## 2014-08-19 ENCOUNTER — Encounter: Payer: Self-pay | Admitting: Rehabilitative and Restorative Service Providers"

## 2014-08-19 DIAGNOSIS — R269 Unspecified abnormalities of gait and mobility: Secondary | ICD-10-CM | POA: Diagnosis not present

## 2014-08-19 DIAGNOSIS — R531 Weakness: Secondary | ICD-10-CM | POA: Diagnosis not present

## 2014-08-19 DIAGNOSIS — M25562 Pain in left knee: Secondary | ICD-10-CM

## 2014-08-19 NOTE — Therapy (Signed)
[place], [name], [address] Phone: [phone]   Fax:  [phone]  Physical Therapy Treatment  Patient Details  Name: Melinda [name] MRN: [id number] Date of Birth: Nov 23, 1931 Referring Provider:  Leandrew Koyanagi, MD  Encounter Date: 08/19/2014      PT End of Session - 08/19/14 1409    Visit Number 8   Number of Visits 18   Date for PT Re-Evaluation 09/26/14   PT Start Time 2248   PT Stop Time 1456   PT Time Calculation (min) 52 min   Activity Tolerance No increased pain      Past Medical History  Diagnosis Date  . Hypercholesterolemia   . Acid reflux   . Obstructive sleep apnea   . Depression   . Insomnia   . Premature atrial contractions   . Anxiety   . Hypertension   . Cataract     surgery,bilateral  . Pyloric antral stenosis     in childhood,infancy  . Multifactorial gait disorder   . Sleep apnea with use of continuous positive airway pressure (CPAP) 10/19/2012    Past Surgical History  Procedure Laterality Date  . Abdominal hysterectomy  1970  . Transthoracic echocardiogram  07/23/2009    mild concentric LVH/mild mitral insufficiency/ moderate tricuspid innsufficiency/mild pulmonary HTN/EF- 55-60%  . Nm myoview ltd  11/30/2007    normal stress nuclear study/EF-83%  . Vaginal delivery    . Inner ear surgery Left     There were no vitals filed for this visit.  Visit Diagnosis:  Weakness generalized  Pain in knee joint, left  Abnormality of gait      Subjective Assessment - 08/19/14 1412    Subjective Feels that her legs are getting stronger. Still some pain.   Pertinent History Has had one injection at her MD visit and this has helped about 85%    How long can you sit comfortably? not a problem   How long can you stand comfortably? 1 hr   How long can you walk comfortably? can make it through the grocery store: stairs are gettig easier - walking up and down step  over step    Diagnostic tests x-rays show degnerative changes and loss of cartilage.     Patient Stated Goals decrease pain so she can do what she wants to do, transition floor to stand without difficulty when working in the yard   Currently in Pain? Yes   Pain Score 3    Pain Location Hip   Pain Orientation Right   Pain Descriptors / Indicators Sharp   Pain Type Chronic pain   Pain Onset More than a month ago   Pain Frequency Intermittent   Aggravating Factors  walking. weight bearing   Pain Relieving Factors rest   Multiple Pain Sites No           [facility] Adult PT Treatment/Exercise - 08/19/14 0001    Knee/Hip Exercises: Stretches   Passive Hamstring Stretch 2 reps;20 seconds  seated    Quad Stretch 2 reps;20 seconds  each leg    Gastroc Stretch 2 reps;20 seconds  each leg    Knee/Hip Exercises: Aerobic   Stationary Bike NuStep L3: 6 min   Knee/Hip Exercises: Standing   Heel Raises 2 sets;10 reps   Forward Lunges 1 set;10 reps;Right;Left  BUE support   Step Down Left;10 reps;2 sets;Hand Hold: 2;Step Height: 6"  (and backward step up) and 5 reps on RLE  Wall Squat --  2 sets x10   SLS Lt/Rt - 1 min x2 reps each - on TB balance wedge   Knee/Hip Exercises: Seated   Long Arc Quad Strengthening   Long Arc Quad Weight --  2 sets 10 reps    Other Seated Knee Exercises 20 steps x 2 sets   Knee/Hip Exercises: Supine   Bridges 10 reps   Straight Leg Raises Strengthening;Right;Left  10 reps   Straight Leg Raise with External Rotation Strengthening;Right;Left  10 reps   Other Supine Knee Exercises resisted clam shell in hooklying with green band x 10 reps -1 set   Knee/Hip Exercises: Prone   Hip Extension Left;Right;10 reps;2 sets   Other Prone Exercises Glut sets 5 sec hold x 10 reps/2 sets             PT Education - 08/19/14 1459    Education provided Yes   Education Details Review HEP encouraged consistent home program   Person(s) Educated Patient   Methods  Demonstration   Comprehension Returned demonstration             PT Long Term Goals - 08/19/14 1502    PT LONG TERM GOAL #1   Title I with advanced HEP   Time 4   Period Weeks   Status Partially Met   PT LONG TERM GOAL #2   Title increase hip strength bilat =/> 5-/5 to allow her to transfer floor to stand without difficulty   Time 4   Period Weeks   Status On-going   PT LONG TERM GOAL #3   Title increase knee strength =/> 5-/5 to allow good eccentric control with stairs   Time 4   Period Weeks   Status On-going   PT LONG TERM GOAL #4   Title improve FOTO =/< CK level ( 48% limited)    Time 4   Period Weeks   Status On-going               Plan - 08/19/14 1501    Clinical Impression Statement Continued progress with exercise program. Gaining strength.   Pt will benefit from skilled therapeutic intervention in order to improve on the following deficits Decreased strength;Pain;Abnormal gait   Rehab Potential Excellent   PT Frequency 2x / week   PT Duration 4 weeks   PT Treatment/Interventions Moist Heat;Patient/family education;Therapeutic exercise;Ultrasound;Gait training;Balance training;Manual techniques;Stair training;Cryotherapy;Electrical Stimulation   PT Next Visit Plan Continue progressive strengthening for hip/knee.  Write MD note for upcoming appt.    Consulted and Agree with Plan of Care Patient        Problem List Patient Active Problem List   Diagnosis Date Noted  . Insomnia with sleep apnea 03/22/2014  . Adjustment disorder with mixed anxiety and depressed mood 03/22/2014  . Sleep apnea with use of continuous positive airway pressure (CPAP) 10/19/2012  . Multifactorial gait disorder     Kinta Martis Nilda Simmer, PT, MPH 08/19/2014, 3:04 PM  [facility] [place], [name], [address] Phone: [phone]   Fax:  [phone]

## 2014-08-19 NOTE — Patient Instructions (Signed)
To continue with HEP

## 2014-08-22 ENCOUNTER — Ambulatory Visit (INDEPENDENT_AMBULATORY_CARE_PROVIDER_SITE_OTHER): Payer: Medicare Other | Admitting: Physical Therapy

## 2014-08-22 DIAGNOSIS — M25562 Pain in left knee: Secondary | ICD-10-CM | POA: Diagnosis not present

## 2014-08-22 DIAGNOSIS — R531 Weakness: Secondary | ICD-10-CM

## 2014-08-22 DIAGNOSIS — R269 Unspecified abnormalities of gait and mobility: Secondary | ICD-10-CM

## 2014-08-22 NOTE — Addendum Note (Signed)
Addended by: Everardo All on: 08/22/2014 01:55 PM   Modules accepted: Orders

## 2014-08-22 NOTE — Therapy (Addendum)
[place], [name], [address] Phone: [phone]   Fax:  [phone]  Physical Therapy Treatment  Patient Details  Name: Melinda [name] MRN: [id number] Date of Birth: 07-Aug-1931 Referring Provider:  Leandrew Koyanagi, MD  Encounter Date: 08/22/2014      PT End of Session - 08/22/14 1107    Visit Number 9   Number of Visits 18   Date for PT Re-Evaluation 09/26/14   PT Start Time 1275   PT Stop Time 1151   PT Time Calculation (min) 46 min   Activity Tolerance No increased pain;Patient tolerated treatment well      Past Medical History  Diagnosis Date  . Hypercholesterolemia   . Acid reflux   . Obstructive sleep apnea   . Depression   . Insomnia   . Premature atrial contractions   . Anxiety   . Hypertension   . Cataract     surgery,bilateral  . Pyloric antral stenosis     in childhood,infancy  . Multifactorial gait disorder   . Sleep apnea with use of continuous positive airway pressure (CPAP) 10/19/2012    Past Surgical History  Procedure Laterality Date  . Abdominal hysterectomy  1970  . Transthoracic echocardiogram  07/23/2009    mild concentric LVH/mild mitral insufficiency/ moderate tricuspid innsufficiency/mild pulmonary HTN/EF- 55-60%  . Nm myoview ltd  11/30/2007    normal stress nuclear study/EF-83%  . Vaginal delivery    . Inner ear surgery Left     There were no vitals filed for this visit.  Visit Diagnosis:  Weakness generalized - Plan: PT plan of care cert/re-cert  Pain in knee joint, left - Plan: PT plan of care cert/re-cert  Abnormality of gait - Plan: PT plan of care cert/re-cert      Subjective Assessment - 08/22/14 1108    Subjective Pt reports she was a little sore the day after last session; resolved with rest.  HEP is still challenging.    Currently in Pain? No/denies            [facility] PT Assessment - 08/22/14 0001    Assessment   Medical  Diagnosis Lt knee OA   Onset Date/Surgical Date 07/22/12   Next MD Visit 08/26/14   Strength   Strength Assessment Site Hip;Knee   Right/Left Hip Right;Left   Right Hip Flexion 3+/5   Right Hip Extension --  5-/5   Right Hip ABduction 4+/5   Left Hip Flexion --  5-/5   Left Hip Extension --  5-/5   Left Hip ABduction 4+/5   Right/Left Knee Right;Left   Right Knee Flexion --  5-/5   Right Knee Extension --  5-/5   Left Knee Flexion 5/5   Left Knee Extension 5/5                     [facility] Adult PT Treatment/Exercise - 08/22/14 0001    Knee/Hip Exercises: Stretches   Passive Hamstring Stretch 2 reps;30 seconds  each leg    Quad Stretch 2 reps;30 seconds  each side   ITB Stretch 2 reps;30 seconds  each side   Knee/Hip Exercises: Aerobic   Stationary Bike NuStep L4: 1.5, L3: 3.5 min   Knee/Hip Exercises: Standing   Heel Raises 1 set;15 reps   Forward Lunges 1 set;Right;Left;10 reps   Side Lunges 10 reps;Right;Left   Wall Squat 1 set;10 reps   Knee/Hip Exercises: Seated   Long Arc  Quad Strengthening;Right;Left;2 sets;10 reps   Long [employer] 4 lbs.   Other Seated Knee Exercises seated marching with 4# on ankles x 10 steps x 3 sets   Knee/Hip Exercises: Supine   Straight Leg Raises Strengthening;Right;10 reps;2 sets   Straight Leg Raises Limitations VC to raise RLE to height of Lt knee                PT Education - 08/22/14 1143    Education provided Yes   Education Details Instructed pt on form for R SLR in supine for HEP (wasn't high enough initially).     Person(s) Educated Patient   Methods Explanation;Demonstration   Comprehension Verbalized understanding;Returned demonstration             PT Long Term Goals - 08/22/14 1144    PT LONG TERM GOAL #1   Title I with advanced HEP - (09/17/14)   Time 8   Period Weeks   Status Partially Met   PT LONG TERM GOAL #2   Title increase hip strength bilat =/> 5-/5 to allow her to transfer  floor to stand without difficulty - (09/17/14)   Time 8   Period Weeks   Status On-going   PT LONG TERM GOAL #3   Title increase knee strength =/> 5-/5 to allow good eccentric control with stairs - (09/17/14)   Time 8   Period Weeks   Status Achieved   PT LONG TERM GOAL #4   Title improve FOTO =/< CK level ( 48% limited) - (09/17/14)   Time 8   Status On-going   PT LONG TERM GOAL #5   Title report no falls in 2 weeks   Time 8   Period Weeks   Status On-going               Plan - 08/22/14 1121    Clinical Impression Statement Pt demonstrated improved Lt knee and hip strength; continues with Rt hip weakness, especially hip flexion (3+/5). Pt reports some discomfort in Lt knee and Rt hip with therapeutic exercise, but declines modalities as pain subsides with rest. Pt has met LTG # 3, progressing towards others. Pt will benefit from continued PT to maximize functional mobility independence.    Pt will benefit from skilled therapeutic intervention in order to improve on the following deficits Decreased strength;Pain;Abnormal gait   Rehab Potential Excellent   PT Frequency 2x / week   PT Duration 4 weeks   PT Treatment/Interventions Moist Heat;Patient/family education;Therapeutic exercise;Ultrasound;Gait training;Balance training;Manual techniques;Stair training;Cryotherapy;Electrical Stimulation   PT Next Visit Plan Continue progressive strengthening for hip/knee.  Complete FOTO - 10th visit.    Consulted and Agree with Plan of Care Patient        Problem List Patient Active Problem List   Diagnosis Date Noted  . Insomnia with sleep apnea 03/22/2014  . Adjustment disorder with mixed anxiety and depressed mood 03/22/2014  . Sleep apnea with use of continuous positive airway pressure (CPAP) 10/19/2012  . Multifactorial gait disorder     Kerin Perna, PTA 08/22/2014 2:04 PM  [place], [name], [address] Phone: [phone]   Fax:  [phone]   Valicia has progress well in PT, however patient has not accomplished all of her PT goals. She has not reached maximum rehab potential. Will benefit from continued PT to accomplish goals, increasing strength and improving functional level.  Celyn P. Helene Kelp, PT, MPH

## 2014-08-27 ENCOUNTER — Ambulatory Visit (INDEPENDENT_AMBULATORY_CARE_PROVIDER_SITE_OTHER): Payer: Medicare Other | Admitting: Physical Therapy

## 2014-08-27 DIAGNOSIS — R531 Weakness: Secondary | ICD-10-CM | POA: Diagnosis not present

## 2014-08-27 DIAGNOSIS — M25562 Pain in left knee: Secondary | ICD-10-CM | POA: Diagnosis not present

## 2014-08-27 DIAGNOSIS — R269 Unspecified abnormalities of gait and mobility: Secondary | ICD-10-CM

## 2014-08-27 NOTE — Therapy (Addendum)
[place], [name], [address] Phone: [phone]   Fax:  [phone]  Physical Therapy Treatment  Patient Details  Name: Melinda [name] MRN: [id number] Date of Birth: [birth year] Referring Provider:  Leandrew Koyanagi, MD  Encounter Date: 08/27/2014      PT End of Session - 08/27/14 1106    Visit Number 10   Number of Visits 18   Date for PT Re-Evaluation 09/26/14   PT Start Time 0350   PT Stop Time 1152   PT Time Calculation (min) 47 min   Activity Tolerance No increased pain;Patient tolerated treatment well      Past Medical History  Diagnosis Date  . Hypercholesterolemia   . Acid reflux   . Obstructive sleep apnea   . Depression   . Insomnia   . Premature atrial contractions   . Anxiety   . Hypertension   . Cataract     surgery,bilateral  . Pyloric antral stenosis     in childhood,infancy  . Multifactorial gait disorder   . Sleep apnea with use of continuous positive airway pressure (CPAP) 10/19/2012    Past Surgical History  Procedure Laterality Date  . Abdominal hysterectomy  1970  . Transthoracic echocardiogram  07/23/2009    mild concentric LVH/mild mitral insufficiency/ moderate tricuspid innsufficiency/mild pulmonary HTN/EF- 55-60%  . Nm myoview ltd  11/30/2007    normal stress nuclear study/EF-83%  . Vaginal delivery    . Inner ear surgery Left     There were no vitals filed for this visit.  Visit Diagnosis:  Weakness generalized  Pain in knee joint, left  Abnormality of gait      Subjective Assessment - 08/27/14 1106    Subjective Pt visited the MD yesterday.  Per pt, MD said we should "ease up on the Rt hip", no need for f/u appt.    Currently in Pain? Yes   Pain Score 1    Pain Location Hip   Pain Orientation Right   Pain Descriptors / Indicators Aching   Pain Frequency Intermittent   Aggravating Factors  walking   Pain Relieving Factors rest              [facility] PT Assessment - 08/27/14 0001    Assessment   Medical Diagnosis Lt knee OA   Onset Date/Surgical Date 07/22/12   Next MD Visit PRN                      [facility] Adult PT Treatment/Exercise - 08/27/14 0001    Knee/Hip Exercises: Stretches   Passive Hamstring Stretch Right;Left;3 reps;30 seconds   Gastroc Stretch 2 reps;20 seconds  each leg    Knee/Hip Exercises: Aerobic   Stationary Bike NuStep L3 : 5 min   Knee/Hip Exercises: Standing   Heel Raises Both;1 set;10 reps   Forward Lunges Right;Left;2 sets;10 reps   Lateral Step Up Right;Left;1 set;10 reps;Hand Hold: 2;Step Height: 6"   SLS SLS - Rt/Lt x 15 sec each side x 3 sets   Other Standing Knee Exercises Backward step up/ forward step down x 10 each leg (6" step, BUE support)    Knee/Hip Exercises: Seated   Long Arc Quad Strengthening;Right;Left;2 sets;10 reps   Other Seated Knee/Hip Exercises seated marching with 4# on ankles x 10 steps x 3 sets   Other Seated Knee/Hip Exercises resisted hip abd with green band; 2 x 10 reps  PT Education - 08/27/14 1114    Education provided Yes   Education Details Pt had been confused about SLR vs Hamstring stretch; re-educated on parameters.    Person(s) Educated Patient   Methods Explanation   Comprehension Verbalized understanding             PT Long Term Goals - 08/27/14 1120    PT LONG TERM GOAL #1   Title I with advanced HEP - (09/17/14)   Time 8   Period Weeks   Status Partially Met   PT LONG TERM GOAL #2   Title increase hip strength bilat =/> 5-/5 to allow her to transfer floor to stand without difficulty - (09/17/14)   Time 8   Period Weeks   Status On-going   PT LONG TERM GOAL #3   Title increase knee strength =/> 5-/5 to allow good eccentric control with stairs - (09/17/14)   Time 8   Period Weeks   Status On-going   PT LONG TERM GOAL #4   Title improve FOTO =/< CK level ( 48% limited) - (09/17/14)   Time  8   Period Weeks   Status On-going  scored 51%   PT LONG TERM GOAL #5   Title report no falls in 2 weeks   Time 8   Period Weeks   Status Achieved               Plan - 08/27/14 1139    Clinical Impression Statement Pt tolerated increased reps of exercises without increased pain.  Pt scored 51% limitation on FOTO today; goal is 48%. Pt has met LTG#4.    Pt will benefit from skilled therapeutic intervention in order to improve on the following deficits Decreased strength;Pain;Abnormal gait   Rehab Potential Excellent   PT Frequency 2x / week   PT Duration 4 weeks   PT Treatment/Interventions Moist Heat;Patient/family education;Therapeutic exercise;Ultrasound;Gait training;Balance training;Manual techniques;Stair training;Cryotherapy;Electrical Stimulation   PT Next Visit Plan Continue progressive strengthening for hip/knee.  Assess hip strength.    Consulted and Agree with Plan of Care Patient        Problem List Patient Active Problem List   Diagnosis Date Noted  . Insomnia with sleep apnea 03/22/2014  . Adjustment disorder with mixed anxiety and depressed mood 03/22/2014  . Sleep apnea with use of continuous positive airway pressure (CPAP) 10/19/2012  . Multifactorial gait disorder     Kerin Perna, PTA 08/27/2014 1:05 PM  [facility] [place], [name], [address] Phone: [phone]   Fax:  [phone]   G- code completed, 10th visit, scored 51% limited on FOTO, CK level .  Jeral Pinch, PT 08/27/2014 1:06 PM

## 2014-08-29 ENCOUNTER — Ambulatory Visit (INDEPENDENT_AMBULATORY_CARE_PROVIDER_SITE_OTHER): Payer: Medicare Other | Admitting: Physical Therapy

## 2014-08-29 DIAGNOSIS — R531 Weakness: Secondary | ICD-10-CM | POA: Diagnosis present

## 2014-08-29 DIAGNOSIS — M25562 Pain in left knee: Secondary | ICD-10-CM | POA: Diagnosis not present

## 2014-08-29 DIAGNOSIS — R269 Unspecified abnormalities of gait and mobility: Secondary | ICD-10-CM

## 2014-08-29 NOTE — Therapy (Signed)
[place], [name], [address] Phone: [phone]   Fax:  [phone]  Physical Therapy Treatment  Patient Details  Name: Melinda [name] MRN: [id number] Date of Birth: 08-02-1931 Referring Provider:  Leandrew Koyanagi, MD  Encounter Date: 08/29/2014      PT End of Session - 08/29/14 1222    Visit Number 11   Number of Visits 18   Date for PT Re-Evaluation 09/26/14   PT Start Time 1150   PT Stop Time 1231   PT Time Calculation (min) 41 min   Activity Tolerance Patient tolerated treatment well;No increased pain      Past Medical History  Diagnosis Date  . Hypercholesterolemia   . Acid reflux   . Obstructive sleep apnea   . Depression   . Insomnia   . Premature atrial contractions   . Anxiety   . Hypertension   . Cataract     surgery,bilateral  . Pyloric antral stenosis     in childhood,infancy  . Multifactorial gait disorder   . Sleep apnea with use of continuous positive airway pressure (CPAP) 10/19/2012    Past Surgical History  Procedure Laterality Date  . Abdominal hysterectomy  1970  . Transthoracic echocardiogram  07/23/2009    mild concentric LVH/mild mitral insufficiency/ moderate tricuspid innsufficiency/mild pulmonary HTN/EF- 55-60%  . Nm myoview ltd  11/30/2007    normal stress nuclear study/EF-83%  . Vaginal delivery    . Inner ear surgery Left     There were no vitals filed for this visit.  Visit Diagnosis:  Weakness generalized  Pain in knee joint, left  Abnormality of gait      Subjective Assessment - 08/29/14 1156    Subjective Pt reports hip and knee feel good today, but LB hurting.   Went up and down steps yesterday quite a few times. Was able to ascend/descend basement steps with reciprocal pattern without difficulty.    Currently in Pain? Yes   Pain Score 5    Pain Location Back   Pain Orientation Lower   Pain Descriptors / Indicators Dull   Aggravating Factors  prolonged standing    Pain Relieving Factors stretches             [facility] PT Assessment - 08/29/14 0001    Assessment   Medical Diagnosis Lt knee OA   Onset Date/Surgical Date 07/22/12   Next MD Visit PRN   Strength   Strength Assessment Site Knee;Hip   Right/Left Hip Right;Left   Right Hip Flexion --  5-/5, no pain   Right Hip Extension --  5-/5   Right Hip ABduction --  5-/5   Left Hip Flexion --  5-/5   Left Hip Extension --  5-/5   Left Hip ABduction 4+/5                     [facility] Adult PT Treatment/Exercise - 08/29/14 0001    Knee/Hip Exercises: Stretches   Sports administrator Right;Left;2 reps;60 seconds   Gastroc Stretch 2 reps;30 seconds;Left;Right   Knee/Hip Exercises: Aerobic   Stationary Bike NuStep L3: 48mn    Knee/Hip Exercises: Supine   Straight Leg Raise with External Rotation Right;Left;5 reps;Strengthening;2 sets  difficult on RLE    Other Supine Knee/Hip Exercises Lower trunk rotation x 5 reps each direction with 10 sec hold.  SKTC x 15 sec x 2 reps each side.    Knee/Hip Exercises: Sidelying   Clams  10 reps x 2 sets each leg.    Modalities   Modalities --  Pt declined any modalities; will use at home if needed.                 PT Education - 08/29/14 1221    Education provided Yes   Education Details Pt instructed to change hooklying resisted clamshells to sidelying clamshells- 2 sets of 10    Person(s) Educated Patient   Methods Explanation   Comprehension Returned demonstration;Verbalized understanding             PT Long Term Goals - 08/29/14 1204    PT LONG TERM GOAL #1   Title I with advanced HEP - (09/17/14)   Time 8   Period Weeks   Status Partially Met   PT LONG TERM GOAL #2   Title increase hip strength bilat =/> 5-/5 to allow her to transfer floor to stand without difficulty - (09/17/14)   Time 8   Period Weeks   Status Partially Met   PT LONG TERM GOAL #3   Title increase knee  strength =/> 5-/5 to allow good eccentric control with stairs - (09/17/14)   Time 8   Period Weeks   Status Achieved   PT LONG TERM GOAL #4   Title improve FOTO =/< CK level ( 48% limited) - (09/17/14)   Time 8   Period Weeks   Status On-going   PT LONG TERM GOAL #5   Title report no falls in 2 weeks   Time 8   Period Weeks   Status Achieved               Plan - 08/29/14 1213    Clinical Impression Statement Pt demo improved hip strength, still shows some weakness in Lt hip abductors.  Pt has reported reduction/elimination of knee/hip pain and improved function with household tasks. Pt has partially met her goals and is interested in decreasing frequency to 1x/wk for remainder of POC.   Pt will benefit from skilled therapeutic intervention in order to improve on the following deficits Decreased strength;Pain;Abnormal gait   Rehab Potential Excellent   PT Frequency 2x / week   PT Duration 4 weeks   PT Treatment/Interventions Moist Heat;Patient/family education;Therapeutic exercise;Ultrasound;Gait training;Balance training;Manual techniques;Stair training;Cryotherapy;Electrical Stimulation   PT Next Visit Plan Continue progressive strengthening for hip/knee.    Consulted and Agree with Plan of Care Patient        Problem List Patient Active Problem List   Diagnosis Date Noted  . Insomnia with sleep apnea 03/22/2014  . Adjustment disorder with mixed anxiety and depressed mood 03/22/2014  . Sleep apnea with use of continuous positive airway pressure (CPAP) 10/19/2012  . Multifactorial gait disorder    Kerin Perna, PTA 08/29/2014 1:01 PM  [facility] [place], [name], [address] Phone: [phone]   Fax:  [phone]

## 2014-09-06 ENCOUNTER — Ambulatory Visit (INDEPENDENT_AMBULATORY_CARE_PROVIDER_SITE_OTHER): Payer: Medicare Other | Admitting: Physical Therapy

## 2014-09-06 DIAGNOSIS — R269 Unspecified abnormalities of gait and mobility: Secondary | ICD-10-CM

## 2014-09-06 DIAGNOSIS — R531 Weakness: Secondary | ICD-10-CM | POA: Diagnosis present

## 2014-09-06 DIAGNOSIS — M25562 Pain in left knee: Secondary | ICD-10-CM

## 2014-09-06 NOTE — Patient Instructions (Signed)
Clam   Lie on side, legs bent 90. Open top knee to ceiling, rotating leg outward. Touch toes to ankle of bottom leg. Close knees, rotating leg inward. Donkey kick back.  Maintain hip position. Repeat __10__ times. Repeat on other side. Do _2___ sets per .

## 2014-09-06 NOTE — Therapy (Signed)
[place], [name], [address] Phone: [phone]   Fax:  [phone]  Physical Therapy Treatment  Patient Details  Name: Melinda [name] MRN: [id number] Date of Birth: 1931-11-24 Referring Provider:  Leandrew Koyanagi, MD  Encounter Date: 09/06/2014      PT End of Session - 09/06/14 1108    Visit Number 12   Number of Visits 18   Date for PT Re-Evaluation 09/26/14   PT Start Time 1105   PT Stop Time 1202   PT Time Calculation (min) 57 min      Past Medical History  Diagnosis Date  . Hypercholesterolemia   . Acid reflux   . Obstructive sleep apnea   . Depression   . Insomnia   . Premature atrial contractions   . Anxiety   . Hypertension   . Cataract     surgery,bilateral  . Pyloric antral stenosis     in childhood,infancy  . Multifactorial gait disorder   . Sleep apnea with use of continuous positive airway pressure (CPAP) 10/19/2012    Past Surgical History  Procedure Laterality Date  . Abdominal hysterectomy  1970  . Transthoracic echocardiogram  07/23/2009    mild concentric LVH/mild mitral insufficiency/ moderate tricuspid innsufficiency/mild pulmonary HTN/EF- 55-60%  . Nm myoview ltd  11/30/2007    normal stress nuclear study/EF-83%  . Vaginal delivery    . Inner ear surgery Left     There were no vitals filed for this visit.  Visit Diagnosis:  Weakness generalized  Pain in knee joint, left  Abnormality of gait      Subjective Assessment - 09/06/14 1105    Subjective Pt reports she has been gardening more; knees hurt more. Sat all day yesterday in car traveling to [place]; thinks this contributed to her discomfort. Requests to come 2x/wk. Back is hurting more, especially in morning.    Currently in Pain? Yes   Pain Score 1    Pain Location Knee   Pain Orientation Left;Right   Aggravating Factors  squatting   Pain Relieving Factors  not squatting.              [facility] PT Assessment - 09/06/14 0001    Assessment   Medical Diagnosis Lt knee OA   Onset Date/Surgical Date 07/22/12   Next MD Visit PRN   Strength   Strength Assessment Site Hip   Right/Left Hip Right;Left   Right Hip Flexion --  4+/5 with groin pain   Right Hip ABduction 4/5   Left Hip Flexion 5/5   Left Hip ABduction 4+/5                     [facility] Adult PT Treatment/Exercise - 09/06/14 0001    Knee/Hip Exercises: Stretches   Passive Hamstring Stretch Right;Left;2 reps;30 seconds   Quad Stretch Right;Left;2 reps;60 seconds   ITB Stretch 2 reps;30 seconds  each side   Gastroc Stretch 2 reps;30 seconds;Left;Right   Knee/Hip Exercises: Aerobic   Stationary Bike NuStep L3: 7.5 min    Knee/Hip Exercises: Standing   Heel Raises Both;2 sets;10 reps   Other Standing Knee Exercises Backward step up/ forward step down x 10 each leg (6" step, BUE support)    Other Standing Knee Exercises Hip extension with hands on hips 5 sec hold x 3 reps (pt noted tightness in Rt hip flexor)   Knee/Hip Exercises: Seated   Other Seated Knee/Hip Exercises seated piriformis stretch  x 25 sec x 2 reps each leg; required tactile and verbal cues for form.    Knee/Hip Exercises: Sidelying   Clams 10 reps each side    Other Sidelying Knee/Hip Exercises Donkey kicks with Hip IR to start x 10 each leg    Modalities   Modalities Moist Heat   Moist Heat Therapy   Number Minutes Moist Heat 12 Minutes   Moist Heat Location Lumbar Spine;Hip  Rt                 PT Education - 09/06/14 1135    Education provided Yes   Education Details Added donkey kick to HEP.  Self care: discussed frequent position changes with gardening and performing back extension exercise afterward (standing).    Person(s) Educated Patient   Methods Handout;Explanation;Demonstration   Comprehension Verbalized understanding;Returned demonstration             PT Long Term Goals - 08/29/14 1204    PT LONG TERM  GOAL #1   Title I with advanced HEP - (09/17/14)   Time 8   Period Weeks   Status Partially Met   PT LONG TERM GOAL #2   Title increase hip strength bilat =/> 5-/5 to allow her to transfer floor to stand without difficulty - (09/17/14)   Time 8   Period Weeks   Status Partially Met   PT LONG TERM GOAL #3   Title increase knee strength =/> 5-/5 to allow good eccentric control with stairs - (09/17/14)   Time 8   Period Weeks   Status Achieved   PT LONG TERM GOAL #4   Title improve FOTO =/< CK level ( 48% limited) - (09/17/14)   Time 8   Period Weeks   Status On-going   PT LONG TERM GOAL #5   Title report no falls in 2 weeks   Time 8   Period Weeks   Status Achieved               Plan - 09/06/14 1132    Clinical Impression Statement Pt demo slight decrease in hip strength especially with R hip flexor during exercise; reports decreasing performance of HEP over last wk since she has been gardening. Pt's sleep position may be contributing to Rt hip tightness; encouraged her to alternate sleep position with supported knees. MHP applied to Rt hip and back due to increase pain in that area after there ex.  Limited progress towards goals this visit.    Pt will benefit from skilled therapeutic intervention in order to improve on the following deficits Decreased strength;Pain;Abnormal gait   Rehab Potential Excellent   PT Frequency 2x / week   PT Duration 4 weeks   PT Treatment/Interventions Moist Heat;Patient/family education;Therapeutic exercise;Ultrasound;Gait training;Balance training;Manual techniques;Stair training;Cryotherapy;Electrical Stimulation   PT Next Visit Plan Continue progressive strengthening for hip/knee.    Consulted and Agree with Plan of Care Patient        Problem List Patient Active Problem List   Diagnosis Date Noted  . Insomnia with sleep apnea 03/22/2014  . Adjustment disorder with mixed anxiety and depressed mood 03/22/2014  . Sleep apnea with use  of continuous positive airway pressure (CPAP) 10/19/2012  . Multifactorial gait disorder    Kerin Perna, PTA 09/06/2014 12:00 PM  [place], [name], [address] Phone: [phone]   Fax:  [phone]

## 2014-09-13 ENCOUNTER — Ambulatory Visit (INDEPENDENT_AMBULATORY_CARE_PROVIDER_SITE_OTHER): Payer: Medicare Other | Admitting: Physical Therapy

## 2014-09-13 DIAGNOSIS — R531 Weakness: Secondary | ICD-10-CM | POA: Diagnosis not present

## 2014-09-13 DIAGNOSIS — M25562 Pain in left knee: Secondary | ICD-10-CM

## 2014-09-13 DIAGNOSIS — R269 Unspecified abnormalities of gait and mobility: Secondary | ICD-10-CM

## 2014-09-13 NOTE — Therapy (Signed)
[place], [name], [address] Phone: [phone]   Fax:  [phone]  Physical Therapy Treatment  Patient Details  Name: ABBIE [name] MRN: [id number] Date of Birth: 1931/11/06 Referring Provider:  Leandrew Koyanagi, MD  Encounter Date: 09/13/2014      PT End of Session - 09/13/14 1331    Visit Number 13   Number of Visits 18   Date for PT Re-Evaluation 09/26/14   PT Start Time 1330   PT Stop Time 1430   PT Time Calculation (min) 60 min      Past Medical History  Diagnosis Date  . Hypercholesterolemia   . Acid reflux   . Obstructive sleep apnea   . Depression   . Insomnia   . Premature atrial contractions   . Anxiety   . Hypertension   . Cataract     surgery,bilateral  . Pyloric antral stenosis     in childhood,infancy  . Multifactorial gait disorder   . Sleep apnea with use of continuous positive airway pressure (CPAP) 10/19/2012    Past Surgical History  Procedure Laterality Date  . Abdominal hysterectomy  1970  . Transthoracic echocardiogram  07/23/2009    mild concentric LVH/mild mitral insufficiency/ moderate tricuspid innsufficiency/mild pulmonary HTN/EF- 55-60%  . Nm myoview ltd  11/30/2007    normal stress nuclear study/EF-83%  . Vaginal delivery    . Inner ear surgery Left     There were no vitals filed for this visit.  Visit Diagnosis:  Pain in knee joint, left  Weakness generalized  Abnormality of gait      Subjective Assessment - 09/13/14 1331    Subjective went to outside concert Sunday night after rain. Could not sleep after that with damp weather and wind. Dug along sidewalk and planted 48 plants. She is not doing well today with  knees.   Pertinent History Has had one injection at her MD visit and this has helped about 85%    How long can you sit comfortably? not a problem   Currently in Pain? Yes   Pain Score 7    Pain Location Knee   Pain Descriptors  / Indicators Aching;Throbbing   Multiple Pain Sites No                         [facility] Adult PT Treatment/Exercise - 09/13/14 0001    Knee/Hip Exercises: Aerobic   Stationary Bike NuStep L4X6   Knee/Hip Exercises: Supine   Bridges Both;1 set;5 reps   Modalities   Modalities Electrical Stimulation;Moist Heat   Moist Heat Therapy   Number Minutes Moist Heat 20 Minutes   Moist Heat Location Hip;Lumbar Spine   Electrical Stimulation   Electrical Stimulation Location hip, lumbar   Electrical Stimulation Action Premod   Electrical Stimulation Goals Pain   Manual Therapy   Manual Therapy Soft tissue mobilization;Passive ROM;Muscle Energy Technique;Joint mobilization   Manual therapy comments gentl   Joint Mobilization gentle hip mobs   Soft tissue mobilization IR/ER with piriformis pressure  Right QL release S/L over rolled pillow   Passive ROM Hams, Add, Abd stretching   Muscle Energy Technique SI adjust. Left Leg significantly longer than Right                PT Education - 09/13/14 1419    Education provided Yes   Education Details equal weight on both LE to take excessive weight bearing off Right  side Hip and Lateral Quads.    Person(s) Educated Patient   Methods Explanation;Demonstration;Verbal cues;Tactile cues   Comprehension Verbalized understanding;Returned demonstration             PT Long Term Goals - 08/29/14 1204    PT LONG TERM GOAL #1   Title I with advanced HEP - (09/17/14)   Time 8   Period Weeks   Status Partially Met   PT LONG TERM GOAL #2   Title increase hip strength bilat =/> 5-/5 to allow her to transfer floor to stand without difficulty - (09/17/14)   Time 8   Period Weeks   Status Partially Met   PT LONG TERM GOAL #3   Title increase knee strength =/> 5-/5 to allow good eccentric control with stairs - (09/17/14)   Time 8   Period Weeks   Status Achieved   PT LONG TERM GOAL #4   Title improve FOTO =/< CK level ( 48%  limited) - (09/17/14)   Time 8   Period Weeks   Status On-going   PT LONG TERM GOAL #5   Title report no falls in 2 weeks   Time 8   Period Weeks   Status Achieved               Plan - 09/13/14 1420    Clinical Impression Statement patient has significant trigger point is Right lateral & medial quads. Right ITB is extremey tight with pain on palpation. Right hip is higher than Left, QL on Right is tense and short. Pt felt a little better with4-6/10 pain decreaaed .   Pt will benefit from skilled therapeutic intervention in order to improve on the following deficits Decreased strength;Pain;Abnormal gait   Rehab Potential Excellent   PT Frequency 2x / week   PT Duration 4 weeks   PT Next Visit Plan Assess Premod and manual therapy. Assess Trigger Pts on Right quads, ITB and right hip rotation. Leg length.    Consulted and Agree with Plan of Care Patient        Problem List Patient Active Problem List   Diagnosis Date Noted  . Insomnia with sleep apnea 03/22/2014  . Adjustment disorder with mixed anxiety and depressed mood 03/22/2014  . Sleep apnea with use of continuous positive airway pressure (CPAP) 10/19/2012  . Multifactorial gait disorder      Natividad Brood, PTA  09/13/2014, 2:28 PM  [facility] [place], [name], [address] Phone: [phone]   Fax:  [phone]

## 2014-09-16 ENCOUNTER — Ambulatory Visit (INDEPENDENT_AMBULATORY_CARE_PROVIDER_SITE_OTHER): Payer: Medicare Other | Admitting: Physical Therapy

## 2014-09-16 DIAGNOSIS — M25562 Pain in left knee: Secondary | ICD-10-CM

## 2014-09-16 DIAGNOSIS — R269 Unspecified abnormalities of gait and mobility: Secondary | ICD-10-CM | POA: Diagnosis not present

## 2014-09-16 DIAGNOSIS — R531 Weakness: Secondary | ICD-10-CM | POA: Diagnosis not present

## 2014-09-16 NOTE — Therapy (Signed)
[place], [name], [address] Phone: [phone]   Fax:  [phone]  Physical Therapy Treatment  Patient Details  Name: Melinda [name] MRN: [id number] Date of Birth: Aug 31, 1931 Referring Provider:  Leandrew Koyanagi, MD  Encounter Date: 09/16/2014      PT End of Session - 09/16/14 1021    Visit Number 14   Number of Visits 18   Date for PT Re-Evaluation 09/26/14   PT Start Time 61   PT Stop Time 1109   PT Time Calculation (min) 50 min      Past Medical History  Diagnosis Date  . Hypercholesterolemia   . Acid reflux   . Obstructive sleep apnea   . Depression   . Insomnia   . Premature atrial contractions   . Anxiety   . Hypertension   . Cataract     surgery,bilateral  . Pyloric antral stenosis     in childhood,infancy  . Multifactorial gait disorder   . Sleep apnea with use of continuous positive airway pressure (CPAP) 10/19/2012    Past Surgical History  Procedure Laterality Date  . Abdominal hysterectomy  1970  . Transthoracic echocardiogram  07/23/2009    mild concentric LVH/mild mitral insufficiency/ moderate tricuspid innsufficiency/mild pulmonary HTN/EF- 55-60%  . Nm myoview ltd  11/30/2007    normal stress nuclear study/EF-83%  . Vaginal delivery    . Inner ear surgery Left     There were no vitals filed for this visit.  Visit Diagnosis:  Pain in knee joint, left  Weakness generalized  Abnormality of gait      Subjective Assessment - 09/16/14 1022    Subjective Pt reports she has been going up and down stairs a lot lately.  Bilateral knees still bothering her; feels her gardening last week continues to take a toll. Rt knee woke her in the night.  Pt admits to non-compliance with HEP.    Currently in Pain? Yes   Pain Score 2    Pain Location Knee   Pain Orientation Left;Right   Pain Descriptors / Indicators --  Rt: throbbing, Lt: ache    Aggravating Factors   kneeling             [facility] PT Assessment - 09/16/14 0001    Assessment   Medical Diagnosis Lt knee OA   Onset Date/Surgical Date 07/22/12   Next MD Visit PRN                     [facility] Adult PT Treatment/Exercise - 09/16/14 0001    Knee/Hip Exercises: Stretches   Active Hamstring Stretch Right;Left;2 reps;30 seconds   Quad Stretch Right;Left;30 seconds;3 reps   ITB Stretch Right;Left;1 rep;30 seconds   Knee/Hip Exercises: Aerobic   Stationary Bike NuStep L4: 2 min, L3: 3 min    Knee/Hip Exercises: Standing   Wall Squat 1 set;15 reps   Wall Squat Limitations pt reported increased Rt knee pain with ascending   Knee/Hip Exercises: Sidelying   Clams 10 reps x 2 sets each side.    Other Sidelying Knee/Hip Exercises Donkey kicks with Hip IR to start x 10 each leg    Knee/Hip Exercises: Prone   Hip Extension Right;Left;1 set;10 reps   Other Prone Exercises TKE with toes tucked, 3 sec hold x 10 reps each leg.    Modalities   Modalities Cryotherapy   Cryotherapy   Number Minutes Cryotherapy 12 Minutes   Cryotherapy Location  Knee  bilat   Type of Cryotherapy Ice pack                PT Education - 09/16/14 1040    Education provided Yes   Education Details Pt given encouragement to keep compliance with HEP.    Person(s) Educated Patient   Methods Explanation   Comprehension Verbalized understanding             PT Long Term Goals - 08/29/14 1204    PT LONG TERM GOAL #1   Title I with advanced HEP - (09/17/14)   Time 8   Period Weeks   Status Partially Met   PT LONG TERM GOAL #2   Title increase hip strength bilat =/> 5-/5 to allow her to transfer floor to stand without difficulty - (09/17/14)   Time 8   Period Weeks   Status Partially Met   PT LONG TERM GOAL #3   Title increase knee strength =/> 5-/5 to allow good eccentric control with stairs - (09/17/14)   Time 8   Period Weeks   Status Achieved   PT LONG TERM GOAL #4   Title improve  FOTO =/< CK level ( 48% limited) - (09/17/14)   Time 8   Period Weeks   Status On-going   PT LONG TERM GOAL #5   Title report no falls in 2 weeks   Time 8   Period Weeks   Status Achieved               Plan - 09/16/14 1149    Clinical Impression Statement Pt reported some pain with Rt quad stretch and "throbbing" with use of ice pack to Rt knee at end of session.  Pt otherwise tolerate all other exercises without increase in pain.  Progressing towards goals.  Pt may revisit MD if Rt knee doesn't calm down.    Pt will benefit from skilled therapeutic intervention in order to improve on the following deficits Decreased strength;Pain;Abnormal gait   Rehab Potential Excellent   PT Frequency 2x / week   PT Duration 4 weeks   PT Treatment/Interventions Moist Heat;Patient/family education;Therapeutic exercise;Ultrasound;Gait training;Balance training;Manual techniques;Stair training;Cryotherapy;Electrical Stimulation   PT Next Visit Plan Continue progressive strengthening/flexibility for LE.    Consulted and Agree with Plan of Care Patient        Problem List Patient Active Problem List   Diagnosis Date Noted  . Insomnia with sleep apnea 03/22/2014  . Adjustment disorder with mixed anxiety and depressed mood 03/22/2014  . Sleep apnea with use of continuous positive airway pressure (CPAP) 10/19/2012  . Multifactorial gait disorder     Kerin Perna, PTA 09/16/2014 11:51 AM  [place], [name], [address] Phone: [phone]   Fax:  [phone]

## 2014-09-19 ENCOUNTER — Encounter: Payer: Medicare Other | Admitting: Physical Therapy

## 2014-09-19 ENCOUNTER — Ambulatory Visit (INDEPENDENT_AMBULATORY_CARE_PROVIDER_SITE_OTHER): Payer: Medicare Other | Admitting: Physical Therapy

## 2014-09-19 DIAGNOSIS — M25562 Pain in left knee: Secondary | ICD-10-CM | POA: Diagnosis present

## 2014-09-19 DIAGNOSIS — R531 Weakness: Secondary | ICD-10-CM | POA: Diagnosis not present

## 2014-09-19 DIAGNOSIS — R269 Unspecified abnormalities of gait and mobility: Secondary | ICD-10-CM

## 2014-09-19 NOTE — Therapy (Signed)
[place], [name], [address] Phone: [phone]   Fax:  [phone]  Physical Therapy Treatment  Patient Details  Name: Melinda [name] MRN: [id number] Date of Birth: [birth year] Referring Provider:  Leandrew Koyanagi, MD  Encounter Date: 09/19/2014      PT End of Session - 09/19/14 1500    Visit Number 15   Number of Visits 18   Date for PT Re-Evaluation 09/26/14   PT Start Time 1450   PT Stop Time 1537   PT Time Calculation (min) 47 min      Past Medical History  Diagnosis Date  . Hypercholesterolemia   . Acid reflux   . Obstructive sleep apnea   . Depression   . Insomnia   . Premature atrial contractions   . Anxiety   . Hypertension   . Cataract     surgery,bilateral  . Pyloric antral stenosis     in childhood,infancy  . Multifactorial gait disorder   . Sleep apnea with use of continuous positive airway pressure (CPAP) 10/19/2012    Past Surgical History  Procedure Laterality Date  . Abdominal hysterectomy  1970  . Transthoracic echocardiogram  07/23/2009    mild concentric LVH/mild mitral insufficiency/ moderate tricuspid innsufficiency/mild pulmonary HTN/EF- 55-60%  . Nm myoview ltd  11/30/2007    normal stress nuclear study/EF-83%  . Vaginal delivery    . Inner ear surgery Left     There were no vitals filed for this visit.  Visit Diagnosis:  Pain in knee joint, left  Weakness generalized  Abnormality of gait      Subjective Assessment - 09/19/14 1505    Subjective Pt reports her left knee has been feeling much better compared to when she began therapy ("70% improvement").  Pt is now reporting Rt knee pain.    Currently in Pain? Yes   Pain Score 4    Pain Location Knee   Pain Orientation Right   Pain Descriptors / Indicators Throbbing   Aggravating Factors  kneeling    Pain Relieving Factors sit with legs elevated.             [facility] PT Assessment -  09/19/14 0001    Assessment   Medical Diagnosis Lt knee OA   Onset Date/Surgical Date 07/22/12   Next MD Visit PRN   Strength   Right/Left Hip Left   Left Hip Extension --  5-/5   Left Hip ABduction 4+/5            [facility] Adult PT Treatment/Exercise - 09/19/14 0001    Knee/Hip Exercises: Stretches   Passive Hamstring Stretch Right;Left;2 reps;30 seconds   Quad Stretch Right;Left;2 reps;60 seconds   Knee/Hip Exercises: Aerobic   Stationary Bike NuStep L3: 74mn    Knee/Hip Exercises: Standing   SLS 3 trials each leg: 15 sec up to 30 sec     Other Standing Knee Exercises sit to/from stand without UE support x 10 x 2 sets    Knee/Hip Exercises: Supine   Straight Leg Raise with External Rotation Strengthening;Right;Left;2 sets  RLE neutral foot position 2nd set due to inc hip/knee pain   Knee/Hip Exercises: Sidelying   Clams 10 reps x 2 sets each side.    Modalities   Modalities --  Pt declined modalities; will do at home.                      PT Long Term Goals -  08/29/14 1204    PT LONG TERM GOAL #1   Title I with advanced HEP - (09/17/14)   Time 8   Period Weeks   Status Partially Met   PT LONG TERM GOAL #2   Title increase hip strength bilat =/> 5-/5 to allow her to transfer floor to stand without difficulty - (09/17/14)   Time 8   Period Weeks   Status Partially Met   PT LONG TERM GOAL #3   Title increase knee strength =/> 5-/5 to allow good eccentric control with stairs - (09/17/14)   Time 8   Period Weeks   Status Achieved   PT LONG TERM GOAL #4   Title improve FOTO =/< CK level ( 48% limited) - (09/17/14)   Time 8   Period Weeks   Status On-going   PT LONG TERM GOAL #5   Title report no falls in 2 weeks   Time 8   Period Weeks   Status Achieved               Plan - 09/19/14 1812    Clinical Impression Statement Pt reported elimination of knee pain with exercise and stretches; declined any modalities at end of session.  Pt has  demo improvement of LLE strength and reduction of symptoms with therapy. Pt progressing towards remaining goals.    Rehab Potential Excellent   PT Frequency 2x / week   PT Duration 4 weeks   PT Treatment/Interventions Moist Heat;Patient/family education;Therapeutic exercise;Ultrasound;Gait training;Balance training;Manual techniques;Stair training;Cryotherapy;Electrical Stimulation   PT Next Visit Plan Spoke to supervising PT regarding pt's progress. Assess need for continued PT next visit for Lt knee. Recommended pt speak to MD regarding treatment for Rt knee/hip. Pt reports may be interested in receiving injection in Rt knee.    Consulted and Agree with Plan of Care Patient        Problem List Patient Active Problem List   Diagnosis Date Noted  . Insomnia with sleep apnea 03/22/2014  . Adjustment disorder with mixed anxiety and depressed mood 03/22/2014  . Sleep apnea with use of continuous positive airway pressure (CPAP) 10/19/2012  . Multifactorial gait disorder    Kerin Perna, PTA 09/19/2014 6:18 PM  [facility] [place], [name], [address] Phone: [phone]   Fax:  [phone]

## 2014-09-23 ENCOUNTER — Ambulatory Visit (INDEPENDENT_AMBULATORY_CARE_PROVIDER_SITE_OTHER): Payer: Medicare Other | Admitting: Physical Therapy

## 2014-09-23 DIAGNOSIS — R531 Weakness: Secondary | ICD-10-CM

## 2014-09-23 DIAGNOSIS — R269 Unspecified abnormalities of gait and mobility: Secondary | ICD-10-CM | POA: Diagnosis not present

## 2014-09-23 DIAGNOSIS — M25562 Pain in left knee: Secondary | ICD-10-CM | POA: Diagnosis not present

## 2014-09-23 NOTE — Therapy (Signed)
[place], [name], [address] Phone: [phone]   Fax:  [phone]  Physical Therapy Treatment  Patient Details  Name: Melinda [name] MRN: [id number] Date of Birth: 06/23/1931 Referring Provider:  Leandrew Koyanagi, MD  Encounter Date: 09/23/2014      PT End of Session - 09/23/14 1403    Visit Number 16   Number of Visits 18   Date for PT Re-Evaluation 09/26/14   PT Start Time 1400   PT Stop Time 1450   PT Time Calculation (min) 50 min      Past Medical History  Diagnosis Date  . Hypercholesterolemia   . Acid reflux   . Obstructive sleep apnea   . Depression   . Insomnia   . Premature atrial contractions   . Anxiety   . Hypertension   . Cataract     surgery,bilateral  . Pyloric antral stenosis     in childhood,infancy  . Multifactorial gait disorder   . Sleep apnea with use of continuous positive airway pressure (CPAP) 10/19/2012    Past Surgical History  Procedure Laterality Date  . Abdominal hysterectomy  1970  . Transthoracic echocardiogram  07/23/2009    mild concentric LVH/mild mitral insufficiency/ moderate tricuspid innsufficiency/mild pulmonary HTN/EF- 55-60%  . Nm myoview ltd  11/30/2007    normal stress nuclear study/EF-83%  . Vaginal delivery    . Inner ear surgery Left     There were no vitals filed for this visit.  Visit Diagnosis:  Pain in knee joint, left  Weakness generalized  Abnormality of gait      Subjective Assessment - 09/23/14 1403    Subjective Pt reports her pain in knees comes and goes dependent upon activity level.  Over weekend, posterior Rt knee (at hamstring attachment) bothering her.  Pt took tylenol for pain this wkend.    Currently in Pain? Yes   Pain Score 4    Pain Location Knee   Pain Orientation Right;Left  in patellar tendon   Pain Descriptors / Indicators Aching   Aggravating Factors  kneeling    Pain Relieving Factors sitting  with legs elevated.             [facility] PT Assessment - 09/23/14 0001    Assessment   Medical Diagnosis Lt knee OA   Onset Date/Surgical Date 07/22/12   Next MD Visit 09/26/14   Strength   Strength Assessment Site Hip   Right/Left Hip Right;Left   Right Hip Flexion --  5-/5   Right Hip Extension 5/5   Right Hip ABduction 4+/5   Left Hip Flexion 5/5   Left Hip Extension --  5-/5   Left Hip ABduction --  5-/5                     [facility] Adult PT Treatment/Exercise - 09/23/14 0001    Knee/Hip Exercises: Stretches   Passive Hamstring Stretch Right;Left;30 seconds   Quad Stretch Left;Right;30 seconds;3 reps   Gastroc Stretch Both;2 reps;30 seconds  VC not to bounce   Knee/Hip Exercises: Aerobic   Stationary Bike NuStep L3: 59mn    Knee/Hip Exercises: Standing   Heel Raises 20 reps   Forward Lunges Right;Left;1 set;5 reps   Forward Lunges Limitations stopped due to increased knee pain today.    Lateral Step Up Right;Left;1 set;10 reps   Other Standing Knee Exercises sit to/from stand without UE support x 10 x 2 sets  Knee/Hip Exercises: Prone   Hip Extension Right;Left;1 set;10 reps   Modalities   Modalities Moist Heat   Moist Heat Therapy   Number Minutes Moist Heat 12 Minutes   Moist Heat Location Knee                     PT Long Term Goals - 08/29/14 1204    PT LONG TERM GOAL #1   Title I with advanced HEP - (09/17/14)   Time 8   Period Weeks   Status Partially Met   PT LONG TERM GOAL #2   Title increase hip strength bilat =/> 5-/5 to allow her to transfer floor to stand without difficulty - (09/17/14)   Time 8   Period Weeks   Status Partially Met   PT LONG TERM GOAL #3   Title increase knee strength =/> 5-/5 to allow good eccentric control with stairs - (09/17/14)   Time 8   Period Weeks   Status Achieved   PT LONG TERM GOAL #4   Title improve FOTO =/< CK level ( 48% limited) - (09/17/14)   Time 8   Period Weeks   Status  On-going   PT LONG TERM GOAL #5   Title report no falls in 2 weeks   Time 8   Period Weeks   Status Achieved               Plan - 09/23/14 1700    Clinical Impression Statement Pt reported increased bilateral knee pain with exercise this date.  Pt demo improved hip strength.  Pain was decreased/eliminated with quad stretch and MHP to bilateral knees at end of session.  Pt making slow progress towards remaining goals with visit   Pt will benefit from skilled therapeutic intervention in order to improve on the following deficits Decreased strength;Pain;Abnormal gait   Rehab Potential Excellent   PT Frequency 2x / week   PT Duration 4 weeks   PT Treatment/Interventions Moist Heat;Patient/family education;Therapeutic exercise;Ultrasound;Gait training;Balance training;Manual techniques;Stair training;Cryotherapy;Electrical Stimulation   PT Next Visit Plan Pt returns to MD this wk.  Pt to speak with MD regarding Rt knee/hip pain.  Will await further advise from referring MD.    Consulted and Agree with Plan of Care Patient        Problem List Patient Active Problem List   Diagnosis Date Noted  . Insomnia with sleep apnea 03/22/2014  . Adjustment disorder with mixed anxiety and depressed mood 03/22/2014  . Sleep apnea with use of continuous positive airway pressure (CPAP) 10/19/2012  . Multifactorial gait disorder     Kerin Perna, PTA 09/23/2014 5:03 PM  [facility] [place], [name], [address] Phone: [phone]   Fax:  [phone]

## 2014-09-26 ENCOUNTER — Encounter: Payer: Self-pay | Admitting: Physical Therapy

## 2014-09-26 ENCOUNTER — Ambulatory Visit (INDEPENDENT_AMBULATORY_CARE_PROVIDER_SITE_OTHER): Payer: Medicare Other | Admitting: Physical Therapy

## 2014-09-26 DIAGNOSIS — M25562 Pain in left knee: Secondary | ICD-10-CM

## 2014-09-26 DIAGNOSIS — R531 Weakness: Secondary | ICD-10-CM | POA: Diagnosis not present

## 2014-09-26 DIAGNOSIS — M25561 Pain in right knee: Secondary | ICD-10-CM

## 2014-09-26 NOTE — Therapy (Signed)
[place], [name], [address] Phone: [phone]   Fax:  [phone]  Physical Therapy Treatment  Patient Details  Name: Melinda [name] MRN: [id number] Date of Birth: Feb 05, 1932 Referring Provider:  Leandrew Koyanagi, MD  Encounter Date: 09/26/2014      PT End of Session - 09/26/14 1502    Visit Number 17   Number of Visits 24   Date for PT Re-Evaluation 10/24/14   PT Start Time 4128   PT Stop Time 1542   PT Time Calculation (min) 55 min   Activity Tolerance Patient tolerated treatment well;Patient limited by fatigue      Past Medical History  Diagnosis Date  . Hypercholesterolemia   . Acid reflux   . Obstructive sleep apnea   . Depression   . Insomnia   . Premature atrial contractions   . Anxiety   . Hypertension   . Cataract     surgery,bilateral  . Pyloric antral stenosis     in childhood,infancy  . Multifactorial gait disorder   . Sleep apnea with use of continuous positive airway pressure (CPAP) 10/19/2012    Past Surgical History  Procedure Laterality Date  . Abdominal hysterectomy  1970  . Transthoracic echocardiogram  07/23/2009    mild concentric LVH/mild mitral insufficiency/ moderate tricuspid innsufficiency/mild pulmonary HTN/EF- 55-60%  . Nm myoview ltd  11/30/2007    normal stress nuclear study/EF-83%  . Vaginal delivery    . Inner ear surgery Left     There were no vitals filed for this visit.  Visit Diagnosis:  Pain in knee joint, left - Plan: PT plan of care cert/re-cert  Weakness generalized - Plan: PT plan of care cert/re-cert  Pain in knee joint, right - Plan: PT plan of care cert/re-cert      Subjective Assessment - 09/26/14 1450    Subjective Pt saw MD, had an injection in her Rt knee and it feels a little better now.  MD wrote an order to start treating the Rt knee also, pt reports occassional back pain too.    How long can you walk comfortably?  short community trips, slow on stairs.    Patient Stated Goals working in the yard, driving - using Rt LE for gas/brake with decreased pain, walk better with less pain   Currently in Pain? Yes   Pain Score 3    Pain Location Knee   Pain Orientation Left;Right;Distal   Pain Descriptors / Indicators Sharp   Pain Type Chronic pain   Pain Onset More than a month ago   Pain Frequency Intermittent   Aggravating Factors  kneeling, transfering sit to stand, prolonged standing   Pain Relieving Factors injections and getting off her feet.             [facility] PT Assessment - 09/26/14 0001    Assessment   Medical Diagnosis Lt and Rt knee OA   Onset Date/Surgical Date 07/22/12   Next MD Visit not scheduled   Precautions   Precautions None   Balance Screen   Has the patient fallen in the past 6 months No   Has the patient had a decrease in activity level because of a fear of falling?  No   Is the patient reluctant to leave their home because of a fear of falling?  No   Observation/Other Assessments   Focus on Therapeutic Outcomes (FOTO)  64% limited   Posture/Postural Control   Posture Comments squats  with bilat knee adduction and unstable.    Strength   Right Hip Flexion --  5-/5   Right Hip Extension 5/5   Right Hip ABduction 4+/5   Left Hip Flexion 5/5   Left Hip Extension --  5-/5   Left Hip ABduction --  5-/5    Right Knee Flexion 5/5   Right Knee Extension 4+/5  with distal knee pain   Left Knee Flexion 5/5   Left Knee Extension 5/5   Flexibility   Soft Tissue Assessment /Muscle Length yes   Quadriceps prone Lt 6" from buttocks, Rt 7"   Palpation   Patella mobility Rt slight lateral tracking, sits deep in groove   Transfers   Transfers Sit to Stand;Stand to Sit   Sit to Stand With upper extremity assist   Stand to Sit With upper extremity assist                     [facility] Adult PT Treatment/Exercise - 09/26/14 0001    Knee/Hip Exercises: Stretches   Engineer, civil (consulting) Both;2 reps;30 seconds  prone with strap   ITB Stretch Both;2 reps;30 seconds  cross body with strap   Knee/Hip Exercises: Aerobic   Stationary Bike NuStep L3: 28min    Knee/Hip Exercises: Supine   Short Arc Quad Sets Strengthening;Both;2 sets;10 reps  with 3#   Bridges Strengthening;2 sets;10 reps   Straight Leg Raise with External Rotation Strengthening;Right;Left;10 reps  fatigues quickly on the Rt   Knee/Hip Exercises: Sidelying   Clams 10 reps x 2 sets each side.    Moist Heat Therapy   Number Minutes Moist Heat 15 Minutes   Moist Heat Location Knee  bilat                PT Education - 09/26/14 1520    Education provided Yes   Education Details HEP   Person(s) Educated Patient   Methods Explanation;Handout   Comprehension Returned demonstration             PT Long Term Goals - 09/26/14 1503    PT LONG TERM GOAL #1   Title I with advanced HEP - (10/24/14)   Time 4   Period Weeks   Status On-going   PT LONG TERM GOAL #2   Title increase hip strength bilat =/> 5-/5 to allow her to transfer floor to stand without difficulty - (10/24/14)   Time 4   Period Weeks   Status On-going   PT LONG TERM GOAL #3   Title increase bilat knee strength =/> 5-/5 to allow good eccentric control with stairs - (10/24/14)   Time 4   Period Weeks   Status New   PT LONG TERM GOAL #4   Title improve FOTO =/< CK level ( 48% limited) - (10/24/14)   Time 4   Period Weeks   Status On-going   PT LONG TERM GOAL #5   Title report =/> 75% decrease of pain in Rt knee while driving ( 3/32/95)   Time 4   Period Weeks   Status New   Additional Long Term Goals   Additional Long Term Goals Yes   PT LONG TERM GOAL #6   Title transfer sit to/from stand without difficult and =/> 75% less pain (10/24/14)   Time 4   Period Weeks   Status New               Problem List Patient Active Problem List   Diagnosis  Date Noted  . Insomnia with sleep apnea 03/22/2014  .  Adjustment disorder with mixed anxiety and depressed mood 03/22/2014  . Sleep apnea with use of continuous positive airway pressure (CPAP) 10/19/2012  . Multifactorial gait disorder     Jeral Pinch PT 09/26/2014, 3:31 PM  [facility] [place], [name], [address] Phone: [phone]   Fax:  [phone]

## 2014-09-26 NOTE — Patient Instructions (Signed)
Straight Leg Raise: With External Leg Rotation   [place] [phone]   Lie on back with right leg straight, opposite leg bent. Rotate straight leg out and lift _12-14___ inches. Repeat 10____ times per set. Do _1-2___ sets per session. Do _1___ sessions per day. Repeat on the left side   Quads / HF, Prone   Lie face down, knees together. Grasp one ankle with same-side hand. Use towel if needed to reach. Gently pull foot toward buttock. Hold 30__ seconds. Repeat _2__ times per session. Do __1_ sessions per day.  Copyright  VHI. All rights reserved.

## 2014-10-01 ENCOUNTER — Encounter: Payer: Self-pay | Admitting: Physical Therapy

## 2014-10-01 ENCOUNTER — Ambulatory Visit (INDEPENDENT_AMBULATORY_CARE_PROVIDER_SITE_OTHER): Payer: Medicare Other | Admitting: Physical Therapy

## 2014-10-01 DIAGNOSIS — M25561 Pain in right knee: Secondary | ICD-10-CM | POA: Diagnosis not present

## 2014-10-01 DIAGNOSIS — M25562 Pain in left knee: Secondary | ICD-10-CM | POA: Diagnosis present

## 2014-10-01 DIAGNOSIS — R531 Weakness: Secondary | ICD-10-CM

## 2014-10-01 NOTE — Therapy (Signed)
[place], [name], [address] Phone: [phone]   Fax:  [phone]  Physical Therapy Treatment  Patient Details  Name: Melinda [name] MRN: [id number] Date of Birth: 1931/11/24 Referring Provider:  Leandrew Koyanagi, MD  Encounter Date: 10/01/2014      PT End of Session - 10/01/14 1020    Visit Number 18   Number of Visits 24   Date for PT Re-Evaluation 10/24/14   PT Start Time 1020   PT Stop Time 1116   PT Time Calculation (min) 56 min      Past Medical History  Diagnosis Date  . Hypercholesterolemia   . Acid reflux   . Obstructive sleep apnea   . Depression   . Insomnia   . Premature atrial contractions   . Anxiety   . Hypertension   . Cataract     surgery,bilateral  . Pyloric antral stenosis     in childhood,infancy  . Multifactorial gait disorder   . Sleep apnea with use of continuous positive airway pressure (CPAP) 10/19/2012    Past Surgical History  Procedure Laterality Date  . Abdominal hysterectomy  1970  . Transthoracic echocardiogram  07/23/2009    mild concentric LVH/mild mitral insufficiency/ moderate tricuspid innsufficiency/mild pulmonary HTN/EF- 55-60%  . Nm myoview ltd  11/30/2007    normal stress nuclear study/EF-83%  . Vaginal delivery    . Inner ear surgery Left     There were no vitals filed for this visit.  Visit Diagnosis:  Pain in knee joint, left  Weakness generalized  Pain in knee joint, right      Subjective Assessment - 10/01/14 1023    Subjective Pt reports she continues to have some knee pain. She feels the pain is coming down some especially at night.    Patient Stated Goals working in the yard, driving - using Rt LE for gas/brake with decreased pain, walk better with less pain   Currently in Pain? Yes   Pain Score 3    Pain Location Knee   Pain Orientation Left;Right   Pain Descriptors / Indicators Sharp   Pain Type Chronic pain   Pain Frequency Intermittent   Aggravating Factors  kneeling, transfers   Pain Relieving Factors rest, heat                         [facility] Adult PT Treatment/Exercise - 10/01/14 0001    Knee/Hip Exercises: Stretches   Sports administrator Both;2 reps;30 seconds  with strap   Gastroc Stretch Both;30 seconds  at wall, VC for form   Knee/Hip Exercises: Aerobic   Stationary Bike NuStep L4: 53min    Knee/Hip Exercises: Standing   Forward Step Up 3 sets;10 reps;Left;Right  Rt 6". Lt 8"   Knee/Hip Exercises: Sidelying   Hip ABduction Strengthening;Both;3 sets;10 reps   Hip ADduction Strengthening;Both;3 sets;10 reps   Knee/Hip Exercises: Prone   Hamstring Curl 3 sets;10 reps  bilat with 4# each side   Moist Heat Therapy   Number Minutes Moist Heat 15 Minutes   Moist Heat Location Knee  bilat                     PT Long Term Goals - 10/01/14 1036    PT LONG TERM GOAL #1   Title I with advanced HEP - (10/24/14)   Status On-going   PT LONG TERM GOAL #2   Title increase hip  strength bilat =/> 5-/5 to allow her to transfer floor to stand without difficulty - (10/24/14)   Status On-going   PT LONG TERM GOAL #3   Title increase bilat knee strength =/> 5-/5 to allow good eccentric control with stairs - (10/24/14)   Status On-going   PT LONG TERM GOAL #4   Title improve FOTO =/< CK level ( 48% limited) - (10/24/14)   PT LONG TERM GOAL #5   Title report =/> 75% decrease of pain in Rt knee while driving ( 10/20/46)   Status On-going   PT LONG TERM GOAL #6   Title transfer sit to/from stand without difficult and =/> 75% less pain (10/24/14)   Status On-going               Plan - 10/01/14 1102    Clinical Impression Statement Pt is slowly getting stronger, able to tolerate more exercise.  She is getting her DM under control and has lost a little weight. Progressing to goals.    Pt will benefit from skilled therapeutic intervention in order to improve on the  following deficits Decreased strength;Pain;Abnormal gait   Rehab Potential Excellent   PT Frequency 2x / week   PT Duration 4 weeks   PT Treatment/Interventions Moist Heat;Patient/family education;Therapeutic exercise;Ultrasound;Gait training;Balance training;Manual techniques;Stair training;Cryotherapy;Electrical Stimulation   PT Next Visit Plan try some floor to stand transfers.    Consulted and Agree with Plan of Care Patient        Problem List Patient Active Problem List   Diagnosis Date Noted  . Insomnia with sleep apnea 03/22/2014  . Adjustment disorder with mixed anxiety and depressed mood 03/22/2014  . Sleep apnea with use of continuous positive airway pressure (CPAP) 10/19/2012  . Multifactorial gait disorder     Jeral Pinch, PT 10/01/2014, 11:03 AM  [facility] [place], [name], [address] Phone: [phone]   Fax:  [phone]

## 2014-10-03 ENCOUNTER — Ambulatory Visit (INDEPENDENT_AMBULATORY_CARE_PROVIDER_SITE_OTHER): Payer: Medicare Other | Admitting: Physical Therapy

## 2014-10-03 DIAGNOSIS — R269 Unspecified abnormalities of gait and mobility: Secondary | ICD-10-CM | POA: Diagnosis not present

## 2014-10-03 DIAGNOSIS — M25561 Pain in right knee: Secondary | ICD-10-CM

## 2014-10-03 DIAGNOSIS — M25562 Pain in left knee: Secondary | ICD-10-CM

## 2014-10-03 DIAGNOSIS — R531 Weakness: Secondary | ICD-10-CM | POA: Diagnosis not present

## 2014-10-03 NOTE — Therapy (Signed)
[place], [name], [address] Phone: [phone]   Fax:  [phone]  Physical Therapy Treatment  Patient Details  Name: Melinda [name] MRN: [id number] Date of Birth: [birth year] Referring Provider:  Leandrew Koyanagi, MD  Encounter Date: 10/03/2014      PT End of Session - 10/03/14 1408    Visit Number 19   Number of Visits 24   Date for PT Re-Evaluation 10/24/14   PT Start Time 4827   PT Stop Time 1457   PT Time Calculation (min) 53 min   Activity Tolerance No increased pain      Past Medical History  Diagnosis Date  . Hypercholesterolemia   . Acid reflux   . Obstructive sleep apnea   . Depression   . Insomnia   . Premature atrial contractions   . Anxiety   . Hypertension   . Cataract     surgery,bilateral  . Pyloric antral stenosis     in childhood,infancy  . Multifactorial gait disorder   . Sleep apnea with use of continuous positive airway pressure (CPAP) 10/19/2012    Past Surgical History  Procedure Laterality Date  . Abdominal hysterectomy  1970  . Transthoracic echocardiogram  07/23/2009    mild concentric LVH/mild mitral insufficiency/ moderate tricuspid innsufficiency/mild pulmonary HTN/EF- 55-60%  . Nm myoview ltd  11/30/2007    normal stress nuclear study/EF-83%  . Vaginal delivery    . Inner ear surgery Left     There were no vitals filed for this visit.  Visit Diagnosis:  Pain in knee joint, left  Weakness generalized  Pain in knee joint, right  Abnormality of gait      Subjective Assessment - 10/03/14 1408    Subjective Pt reports bilateral knees are "sore" from where she fell two days ago. Pt reports she falls 1-2x/ month, both on even and uneven surface - leg just gives out.  Pt reports she's not agreeable to use of SPC, wants to feel indepent and not seen with cane.    Currently in Pain? Yes   Pain Score 2    Pain Location Knee   Pain Orientation  Right;Left   Pain Descriptors / Indicators Sore   Aggravating Factors  kneeling    Pain Relieving Factors rest, heat.                          [facility] Adult PT Treatment/Exercise - 10/03/14 0001    Knee/Hip Exercises: Stretches   Passive Hamstring Stretch Right;Left;2 reps;30 seconds   Quad Stretch Right;Left;2 reps;30 seconds   Gastroc Stretch Right;Left;2 reps;30 seconds   Knee/Hip Exercises: Aerobic   Stationary Bike NuStep L3: 66min    Knee/Hip Exercises: Standing   Heel Raises Both;10 reps;2 sets   Forward Step Up Right;Left;2 sets;10 reps;Step Height: 2";Step Height: 6"   Other Standing Knee Exercises Hamstring curls with 2.5# at ankle x 10 reps each leg x 2 sets   Knee/Hip Exercises: Seated   Long Arc Quad Strengthening;Right;Left;2 sets;10 reps;Weights   Long Arc Quad Weight --  2.5#   Moist Heat Therapy   Number Minutes Moist Heat 12 Minutes   Moist Heat Location Knee  bilat                     PT Long Term Goals - 10/01/14 1036    PT LONG TERM GOAL #1   Title I with advanced HEP - (  10/24/14)   Status On-going   PT LONG TERM GOAL #2   Title increase hip strength bilat =/> 5-/5 to allow her to transfer floor to stand without difficulty - (10/24/14)   Status On-going   PT LONG TERM GOAL #3   Title increase bilat knee strength =/> 5-/5 to allow good eccentric control with stairs - (10/24/14)   Status On-going   PT LONG TERM GOAL #4   Title improve FOTO =/< CK level ( 48% limited) - (10/24/14)   PT LONG TERM GOAL #5   Title report =/> 75% decrease of pain in Rt knee while driving ( 05/06/29)   Status On-going   PT LONG TERM GOAL #6   Title transfer sit to/from stand without difficult and =/> 75% less pain (10/24/14)   Status On-going               Plan - 10/03/14 1446    Clinical Impression Statement Pt tolerated all exercises without increased pain. Noted increased difficulty with Rt quad eccentric control.  Progressing towards  goals.    Pt will benefit from skilled therapeutic intervention in order to improve on the following deficits Decreased strength;Pain;Abnormal gait   Rehab Potential Excellent   PT Frequency 2x / week   PT Duration 4 weeks   PT Treatment/Interventions Moist Heat;Patient/family education;Therapeutic exercise;Ultrasound;Gait training;Balance training;Manual techniques;Stair training;Cryotherapy;Electrical Stimulation   PT Next Visit Plan try some floor to stand transfers.    Consulted and Agree with Plan of Care Patient        Problem List Patient Active Problem List   Diagnosis Date Noted  . Insomnia with sleep apnea 03/22/2014  . Adjustment disorder with mixed anxiety and depressed mood 03/22/2014  . Sleep apnea with use of continuous positive airway pressure (CPAP) 10/19/2012  . Multifactorial gait disorder    Kerin Perna, PTA 10/03/2014 2:48 PM  [place], [name], [address] Phone: [phone]   Fax:  [phone]

## 2014-10-08 ENCOUNTER — Ambulatory Visit (INDEPENDENT_AMBULATORY_CARE_PROVIDER_SITE_OTHER): Payer: Medicare Other | Admitting: Physical Therapy

## 2014-10-08 DIAGNOSIS — R269 Unspecified abnormalities of gait and mobility: Secondary | ICD-10-CM

## 2014-10-08 DIAGNOSIS — M25562 Pain in left knee: Secondary | ICD-10-CM | POA: Diagnosis not present

## 2014-10-08 DIAGNOSIS — M25561 Pain in right knee: Secondary | ICD-10-CM | POA: Diagnosis not present

## 2014-10-08 DIAGNOSIS — R531 Weakness: Secondary | ICD-10-CM

## 2014-10-08 NOTE — Therapy (Signed)
[place], [name], [address] Phone: [phone]   Fax:  [phone]  Physical Therapy Treatment  Patient Details  Name: Melinda [name] MRN: [id number] Date of Birth: [birth year] Referring Provider:  Leandrew Koyanagi, MD  Encounter Date: 10/08/2014      PT End of Session - 10/08/14 0935    Visit Number 20   Number of Visits 24   Date for PT Re-Evaluation 10/24/14   PT Start Time 0937   PT Stop Time 1037   PT Time Calculation (min) 60 min      Past Medical History  Diagnosis Date  . Hypercholesterolemia   . Acid reflux   . Obstructive sleep apnea   . Depression   . Insomnia   . Premature atrial contractions   . Anxiety   . Hypertension   . Cataract     surgery,bilateral  . Pyloric antral stenosis     in childhood,infancy  . Multifactorial gait disorder   . Sleep apnea with use of continuous positive airway pressure (CPAP) 10/19/2012    Past Surgical History  Procedure Laterality Date  . Abdominal hysterectomy  1970  . Transthoracic echocardiogram  07/23/2009    mild concentric LVH/mild mitral insufficiency/ moderate tricuspid innsufficiency/mild pulmonary HTN/EF- 55-60%  . Nm myoview ltd  11/30/2007    normal stress nuclear study/EF-83%  . Vaginal delivery    . Inner ear surgery Left     There were no vitals filed for this visit.  Visit Diagnosis:  Pain in knee joint, left  Weakness generalized  Pain in knee joint, right  Abnormality of gait      Subjective Assessment - 10/08/14 0942    Subjective Pt reports her shins are still sore and tender from her fall last week.  Pt feels therapy has helped her walking "I don't limp as bad" and she can get RLE in car without using UE.    Patient Stated Goals working in the yard, driving - using Rt LE for gas/brake with decreased pain, walk better with less pain   Currently in Pain? Yes   Pain Score 3    Pain Location Knee   Pain  Orientation Right;Left   Pain Descriptors / Indicators Sharp   Aggravating Factors  walking, kneeling    Pain Relieving Factors rest, heat             [facility] PT Assessment - 10/08/14 0001    Assessment   Medical Diagnosis Lt and Rt knee OA   Onset Date/Surgical Date 07/22/12   Next MD Visit not scheduled   Precautions   Precautions None   Observation/Other Assessments   Focus on Therapeutic Outcomes (FOTO)  51% limited (goal 48%)   Standardized Balance Assessment   Standardized Balance Assessment Dynamic Gait Index   Dynamic Gait Index   Level Surface Normal   Change in Gait Speed Mild Impairment   Gait with Horizontal Head Turns Moderate Impairment  staggers and veers L with Lt head turn   Gait with Vertical Head Turns Normal   Gait and Pivot Turn Normal   Step Over Obstacle Moderate Impairment  LOB with Lt stance   Step Around Obstacles Moderate Impairment  knocked over object with R ft, with Lt stance.    Steps Mild Impairment   Total Score 16                     [facility] Adult PT Treatment/Exercise -  10/08/14 0001    Standardized Balance Assessment   Standardized Balance Assessment Dynamic Gait Index   Knee/Hip Exercises: Stretches   Passive Hamstring Stretch Right;Left;2 reps;30 seconds   Quad Stretch Right;Left;2 reps;30 seconds   Gastroc Stretch Right;Left;2 reps;30 seconds   Knee/Hip Exercises: Aerobic   Stationary Bike NuStep L3: 46min    Knee/Hip Exercises: Standing   Heel Raises Both;2 sets;10 reps   Functional Squat 20 reps   Wall Squat 2 sets;10 reps;5 reps  with ball squeeze    Wall Squat Limitations 2nd set only 5 reps due to increased knee pain and blouse not sliding.    SLS 3 trials on LLE up to 15 sec.  2 trials on RLE with left head turns x 15 sec.    Other Standing Knee Exercises SLS with toe taps front/side/back with opposite leg x 30 sec each leg (challenging)    Moist Heat Therapy   Number Minutes Moist Heat 12 Minutes   Moist Heat  Location Lumbar Spine, bilateral knees   Manual Therapy   Manual Therapy Taping   Manual therapy comments Kinesiotape applied to anterior/lateral portion of Lt proximal tibia to decrease pain and bruising.              PT Long Term Goals - 10/01/14 1036    PT LONG TERM GOAL #1   Title I with advanced HEP - (10/24/14)   Status On-going   PT LONG TERM GOAL #2   Title increase hip strength bilat =/> 5-/5 to allow her to transfer floor to stand without difficulty - (10/24/14)   Status On-going   PT LONG TERM GOAL #3   Title increase bilat knee strength =/> 5-/5 to allow good eccentric control with stairs - (10/24/14)   Status On-going   PT LONG TERM GOAL #4   Title improve FOTO =/< CK level ( 48% limited) - (10/24/14)   PT LONG TERM GOAL #5   Title report =/> 75% decrease of pain in Rt knee while driving ( 4/49/67)   Status On-going   PT LONG TERM GOAL #6   Title transfer sit to/from stand without difficult and =/> 75% less pain (10/24/14)   Status On-going               Plan - 10/08/14 1142    Clinical Impression Statement Pt scored 16 on Dynamic Gait index; scores <19 are indicative of increased fall risk in community.  Pt had LOB/ unsteadiness with Lt head turns with gait, Lt stance when clearing obstacle with RLE. Pt still apprehensive regarding use of cane; re-encouraged to consider use to decrease risk of falls.   Pt tolerated most exercises without increase in pain in knees today.  Pt's FOTO score improved from last assessment; 51% limited, 48% is goal.    Pt will benefit from skilled therapeutic intervention in order to improve on the following deficits Decreased strength;Pain;Abnormal gait   Rehab Potential Excellent   PT Frequency 2x / week   PT Duration 4 weeks   PT Treatment/Interventions Moist Heat;Patient/family education;Therapeutic exercise;Ultrasound;Gait training;Balance training;Manual techniques;Stair training;Cryotherapy;Electrical Stimulation   PT Next  Visit Plan May repeat DGI exercises with use of cane to improve balance. Continue strengthening of BLE and proprioception/balance exercises.    Consulted and Agree with Plan of Care Patient        Problem List Patient Active Problem List   Diagnosis Date Noted  . Insomnia with sleep apnea 03/22/2014  . Adjustment disorder with mixed anxiety and  depressed mood 03/22/2014  . Sleep apnea with use of continuous positive airway pressure (CPAP) 10/19/2012  . Multifactorial gait disorder     Kerin Perna, PTA 10/08/2014 11:51 AM  [place], [name], [address] Phone: [phone]   Fax:  [phone]

## 2014-10-08 NOTE — Therapy (Signed)
[place], [name], [address] Phone: [phone]   Fax:  [phone]  Physical Therapy Treatment  Patient Details  Name: Melinda [name] MRN: [id number] Date of Birth: Mar 12, 1931 Referring Provider:  Leandrew Koyanagi, MD  Encounter Date: 10/08/2014      PT End of Session - 10/08/14 0935    Visit Number 20   Number of Visits 24   Date for PT Re-Evaluation 10/24/14   PT Start Time 0937   PT Stop Time 1037   PT Time Calculation (min) 60 min      Past Medical History  Diagnosis Date  . Hypercholesterolemia   . Acid reflux   . Obstructive sleep apnea   . Depression   . Insomnia   . Premature atrial contractions   . Anxiety   . Hypertension   . Cataract     surgery,bilateral  . Pyloric antral stenosis     in childhood,infancy  . Multifactorial gait disorder   . Sleep apnea with use of continuous positive airway pressure (CPAP) 10/19/2012    Past Surgical History  Procedure Laterality Date  . Abdominal hysterectomy  1970  . Transthoracic echocardiogram  07/23/2009    mild concentric LVH/mild mitral insufficiency/ moderate tricuspid innsufficiency/mild pulmonary HTN/EF- 55-60%  . Nm myoview ltd  11/30/2007    normal stress nuclear study/EF-83%  . Vaginal delivery    . Inner ear surgery Left     There were no vitals filed for this visit.  Visit Diagnosis:  Pain in knee joint, left  Weakness generalized  Pain in knee joint, right  Abnormality of gait      Subjective Assessment - 10/08/14 0942    Subjective Pt reports her shins are still sore and tender from her fall last week.  Pt feels therapy has helped her walking "I don't limp as bad" and she can get RLE in car without using UE.    Patient Stated Goals working in the yard, driving - using Rt LE for gas/brake with decreased pain, walk better with less pain   Currently in Pain? Yes   Pain Score 3    Pain Location Knee   Pain  Orientation Right;Left   Pain Descriptors / Indicators Sharp   Aggravating Factors  walking, kneeling    Pain Relieving Factors rest, heat             [facility] PT Assessment - 10/08/14 0001    Assessment   Medical Diagnosis Lt and Rt knee OA   Onset Date/Surgical Date 07/22/12   Next MD Visit not scheduled   Precautions   Precautions None   Observation/Other Assessments   Focus on Therapeutic Outcomes (FOTO)  51% limited (goal 48%)   Standardized Balance Assessment   Standardized Balance Assessment Dynamic Gait Index   Dynamic Gait Index   Level Surface Normal   Change in Gait Speed Mild Impairment   Gait with Horizontal Head Turns Moderate Impairment  staggers and veers L with Lt head turn   Gait with Vertical Head Turns Normal   Gait and Pivot Turn Normal   Step Over Obstacle Moderate Impairment  LOB with Lt stance   Step Around Obstacles Moderate Impairment  knocked over object with R ft, with Lt stance.    Steps Mild Impairment   Total Score 16                     [facility] Adult PT Treatment/Exercise -  10/08/14 0001    Standardized Balance Assessment   Standardized Balance Assessment Dynamic Gait Index   Knee/Hip Exercises: Stretches   Passive Hamstring Stretch Right;Left;2 reps;30 seconds   Quad Stretch Right;Left;2 reps;30 seconds   Gastroc Stretch Right;Left;2 reps;30 seconds   Knee/Hip Exercises: Aerobic   Stationary Bike NuStep L3: 59min    Knee/Hip Exercises: Standing   Heel Raises Both;2 sets;10 reps   Functional Squat 20 reps   Wall Squat 2 sets;10 reps;5 reps  with ball squeeze    Wall Squat Limitations 2nd set only 5 reps due to increased knee pain and blouse not sliding.    SLS 3 trials on LLE up to 15 sec.  2 trials on RLE with left head turns x 15 sec.    Other Standing Knee Exercises SLS with toe taps front/side/back with opposite leg x 30 sec each leg (challenging)    Moist Heat Therapy   Number Minutes Moist Heat 12 Minutes   Moist Heat  Location Lumbar Spine   Manual Therapy   Manual Therapy Taping   Manual therapy comments Kinesiotape applied to anterior/lateral portion of Lt proximal tibia to decrease pain and bruising.                      PT Long Term Goals - 10/08/14 1256    Additional Long Term Goals   Additional Long Term Goals Yes   PT LONG TERM GOAL #7   Title improve DGI score =/> 20/24 ( 10/24/14)   Time 2   Status New               Plan - 10/08/14 1142    Clinical Impression Statement Pt scored 16 on Dynamic Gait index; scores <19 are indicative of increased fall risk in community.  Pt had LOB/ unsteadiness with Lt head turns with gait, Lt stance when clearing obstacle with RLE. Pt still apprehensive regarding use of cane; re-encouraged to consider use to decrease risk of falls.   Pt tolerated most exercises without increase in pain in knees today.  Pt's FOTO score improved from last assessment; 51% limited, 48% is goal.    Pt will benefit from skilled therapeutic intervention in order to improve on the following deficits Decreased strength;Pain;Abnormal gait   Rehab Potential Excellent   PT Frequency 2x / week   PT Duration 4 weeks   PT Treatment/Interventions Moist Heat;Patient/family education;Therapeutic exercise;Ultrasound;Gait training;Balance training;Manual techniques;Stair training;Cryotherapy;Electrical Stimulation   PT Next Visit Plan May repeat DGI exercises with use of cane to improve balance. Continue strengthening of BLE and proprioception/balance exercises.    Consulted and Agree with Plan of Care Patient        Problem List Patient Active Problem List   Diagnosis Date Noted  . Insomnia with sleep apnea 03/22/2014  . Adjustment disorder with mixed anxiety and depressed mood 03/22/2014  . Sleep apnea with use of continuous positive airway pressure (CPAP) 10/19/2012  . Multifactorial gait disorder     Kerin Perna 10/08/2014, 12:57 PM  [facility] [place], [name], [address] Phone: [phone]   Fax:  [place], PT 10/08/2014 12:57 PM

## 2014-10-10 ENCOUNTER — Ambulatory Visit (INDEPENDENT_AMBULATORY_CARE_PROVIDER_SITE_OTHER): Payer: Medicare Other | Admitting: Physical Therapy

## 2014-10-10 DIAGNOSIS — R531 Weakness: Secondary | ICD-10-CM

## 2014-10-10 DIAGNOSIS — M25562 Pain in left knee: Secondary | ICD-10-CM

## 2014-10-10 DIAGNOSIS — R269 Unspecified abnormalities of gait and mobility: Secondary | ICD-10-CM | POA: Diagnosis not present

## 2014-10-10 DIAGNOSIS — M25561 Pain in right knee: Secondary | ICD-10-CM | POA: Diagnosis not present

## 2014-10-10 NOTE — Therapy (Signed)
[place], [name], [address] Phone: [phone]   Fax:  [phone]  Physical Therapy Treatment  Patient Details  Name: Melinda [name] MRN: [id number] Date of Birth: 1931-06-22 Referring Provider:  Leandrew Koyanagi, MD  Encounter Date: 10/10/2014      PT End of Session - 10/10/14 1408    Visit Number 21   Number of Visits 24   Date for PT Re-Evaluation 10/24/14   PT Start Time 9767   PT Stop Time 1501   PT Time Calculation (min) 58 min   Activity Tolerance Patient tolerated treatment well      Past Medical History  Diagnosis Date  . Hypercholesterolemia   . Acid reflux   . Obstructive sleep apnea   . Depression   . Insomnia   . Premature atrial contractions   . Anxiety   . Hypertension   . Cataract     surgery,bilateral  . Pyloric antral stenosis     in childhood,infancy  . Multifactorial gait disorder   . Sleep apnea with use of continuous positive airway pressure (CPAP) 10/19/2012    Past Surgical History  Procedure Laterality Date  . Abdominal hysterectomy  1970  . Transthoracic echocardiogram  07/23/2009    mild concentric LVH/mild mitral insufficiency/ moderate tricuspid innsufficiency/mild pulmonary HTN/EF- 55-60%  . Nm myoview ltd  11/30/2007    normal stress nuclear study/EF-83%  . Vaginal delivery    . Inner ear surgery Left     There were no vitals filed for this visit.  Visit Diagnosis:  Pain in knee joint, left  Weakness generalized  Pain in knee joint, right  Abnormality of gait      Subjective Assessment - 10/10/14 1408    Subjective Pt reports pain is only when walking and stairs.  She was able to sleep better with kinesiotape on Lt shin; pain didn't wake her like previously.    Currently in Pain? Yes   Pain Score 2    Pain Location Knee   Pain Orientation Right;Left   Aggravating Factors  walking/ kneeling    Pain Relieving Factors rest, heat              [facility] PT Assessment - 10/10/14 0001    Assessment   Medical Diagnosis Lt and Rt knee OA   Onset Date/Surgical Date 07/22/12   Next MD Visit not scheduled   Strength   Right/Left Hip Right;Left   Right Hip Flexion --  5-/5   Right Hip Extension 5/5   Right Hip ABduction --  5-/5   Left Hip Extension 4+/5   Left Hip ABduction 5/5   Right Knee Extension 4+/5                     [facility] Adult PT Treatment/Exercise - 10/10/14 0001    High Level Balance   High Level Balance Activities Side stepping;Head turns   High Level Balance Comments Tandem stance Lt/Rt with opposite direction head turns x 10 each leg, repeated Lt forward stance and Lt head turns x 10 (challenging); side stepping Rt - 1 LOB with Lt stance, corrrected with step strategy.  Toe taps to cone on 3" step x 10 reps each leg, repeated with Lt stance and Rt tap (challenging)    Knee/Hip Exercises: Aerobic   Stationary Bike NuStep L3: 44mn    Knee/Hip Exercises: Standing   SLS 2 trials each leg up to 30 sec.;  Lt  SLS with Rt toe taps front/side/back x 5 reps each direction   Other Standing Knee Exercises sit to stand no UE support x 10 reps    Knee/Hip Exercises: Supine   Straight Leg Raises Right;1 set;10 reps   Knee/Hip Exercises: Sidelying   Clams 10 reps each side   Knee/Hip Exercises: Prone   Hip Extension 1 set;Left;10 reps   Moist Heat Therapy   Number Minutes Moist Heat 12 Minutes   Moist Heat Location Lumbar Spine;Knee   Manual Therapy   Manual Therapy Taping   Manual therapy comments Kinesiotape applied to anterior/lateral portion of Lt proximal tibia to decrease pain and bruising.                      PT Long Term Goals - 10/10/14 1411    PT LONG TERM GOAL #1   Title I with advanced HEP - (10/24/14)   Time 4   Period Weeks   Status On-going   PT LONG TERM GOAL #2   Title increase hip strength bilat =/> 5-/5 to allow her to transfer floor to stand without  difficulty - (10/24/14)   Time 4   Period Weeks   Status On-going   PT LONG TERM GOAL #3   Title increase bilat knee strength =/> 5-/5 to allow good eccentric control with stairs - (10/24/14)   Time 4   Period Weeks   Status On-going   PT LONG TERM GOAL #4   Title improve FOTO =/< CK level ( 48% limited) - (10/24/14)   Time 4   Period Weeks   Status On-going   PT LONG TERM GOAL #5   Title report =/> 75% decrease of pain in Rt knee while driving ( 5/44/92)   Time 4   Period Weeks   Status Achieved  reports 85% decrease in pain   PT LONG TERM GOAL #6   Title transfer sit to/from stand without difficult and =/> 75% less pain (10/24/14)   Time 4   Status Achieved  Pt reports 75% less pain in knees with sit to stand   PT LONG TERM GOAL #7   Title improve DGI score =/> 20/24 ( 10/24/14)   Time 2   Period Weeks   Status On-going               Plan - 10/10/14 1420    Clinical Impression Statement Pt has met LTG #5 & 6, reporting decrease in knee pain with functional activities. Pt noted improvement of bruising/pain with use of ktape.  Pt continues with decreased balance with Lt stance activities, compared to Rt SLS.    Pt will benefit from skilled therapeutic intervention in order to improve on the following deficits Decreased strength;Pain;Abnormal gait   Rehab Potential Excellent   PT Frequency 2x / week   PT Duration 4 weeks   PT Treatment/Interventions Moist Heat;Patient/family education;Therapeutic exercise;Ultrasound;Gait training;Balance training;Manual techniques;Stair training;Cryotherapy;Electrical Stimulation   PT Next Visit Plan May repeat DGI exercises with use of cane to improve balance. Continue strengthening of BLE and proprioception/balance exercises.    Consulted and Agree with Plan of Care Patient        Problem List Patient Active Problem List   Diagnosis Date Noted  . Insomnia with sleep apnea 03/22/2014  . Adjustment disorder with mixed anxiety  and depressed mood 03/22/2014  . Sleep apnea with use of continuous positive airway pressure (CPAP) 10/19/2012  . Multifactorial gait disorder     Anderson Malta  Lorina Rabon, PTA 10/10/2014 4:30 PM  [facility] [place], [name], [address] Phone: [phone]   Fax:  [phone]

## 2014-10-10 NOTE — Patient Instructions (Signed)
*     Toe taps - standing on Left leg and tap with Rt toe.  *  Side stepping - practice stepping Rt.  * Staggered stance, left foot forward and Left head turns.  (always be by supportive surface - railing, counter, etc)  [facility] [address] [place], [place] [address]  [phone] (office) [phone] (fax)

## 2014-10-14 ENCOUNTER — Ambulatory Visit (INDEPENDENT_AMBULATORY_CARE_PROVIDER_SITE_OTHER): Payer: Medicare Other | Admitting: Physical Therapy

## 2014-10-14 DIAGNOSIS — R269 Unspecified abnormalities of gait and mobility: Secondary | ICD-10-CM | POA: Diagnosis not present

## 2014-10-14 DIAGNOSIS — M25561 Pain in right knee: Secondary | ICD-10-CM | POA: Diagnosis not present

## 2014-10-14 DIAGNOSIS — R531 Weakness: Secondary | ICD-10-CM

## 2014-10-14 DIAGNOSIS — M25562 Pain in left knee: Secondary | ICD-10-CM | POA: Diagnosis not present

## 2014-10-14 NOTE — Therapy (Signed)
[facility] [place], [name], [address] Phone: [phone]   Fax:  [phone]  Physical Therapy Treatment  Patient Details  Name: Melinda [name] MRN: [id number] Date of Birth: 08-20-1931 Referring Provider:  Leandrew Koyanagi, MD  Encounter Date: 10/14/2014      PT End of Session - 10/14/14 1452    Visit Number 22   Number of Visits 24   Date for PT Re-Evaluation 10/24/14   PT Start Time 1452   PT Stop Time 1545   PT Time Calculation (min) 53 min   Activity Tolerance No increased pain;Patient tolerated treatment well      Past Medical History  Diagnosis Date  . Hypercholesterolemia   . Acid reflux   . Obstructive sleep apnea   . Depression   . Insomnia   . Premature atrial contractions   . Anxiety   . Hypertension   . Cataract     surgery,bilateral  . Pyloric antral stenosis     in childhood,infancy  . Multifactorial gait disorder   . Sleep apnea with use of continuous positive airway pressure (CPAP) 10/19/2012    Past Surgical History  Procedure Laterality Date  . Abdominal hysterectomy  1970  . Transthoracic echocardiogram  07/23/2009    mild concentric LVH/mild mitral insufficiency/ moderate tricuspid innsufficiency/mild pulmonary HTN/EF- 55-60%  . Nm myoview ltd  11/30/2007    normal stress nuclear study/EF-83%  . Vaginal delivery    . Inner ear surgery Left     There were no vitals filed for this visit.  Visit Diagnosis:  Pain in knee joint, left  Weakness generalized  Pain in knee joint, right  Abnormality of gait      Subjective Assessment - 10/14/14 1456    Subjective Pt reports her Lt knee doesn't feel as hot or swollen anymore. (Presents with residual bruising to Lt knee from fall).  Hasn't had any more falls. Balance exercises are getting a little easier. Sat on stool for weeding-that helped quite a bit.    Currently in Pain? Yes   Pain Score 1    Pain Location Knee    Pain Orientation Left   Aggravating Factors  walking/kneeling   Pain Relieving Factors rest, heat            [facility] PT Assessment - 10/14/14 0001    Assessment   Medical Diagnosis Lt and Rt knee OA   Onset Date/Surgical Date 07/22/12   Next MD Visit not scheduled   Strength   Right/Left Hip Right;Left   Right Hip Flexion 4+/5   Right Hip Extension 5/5   Right Hip ABduction --  5-/5   Left Hip Flexion 5/5   Left Hip Extension 4+/5   Left Hip ABduction 4+/5   Right Knee Flexion 5/5   Right Knee Extension 5/5   Left Knee Flexion --  5-/5   Left Knee Extension 5/5   High Level Balance   High Level Balance Activities Side stepping;Tandem walking           [facility] Adult PT Treatment/Exercise - 10/14/14 0001    High Level Balance   High Level Balance Comments Side stepping without UE support x 12 ft x 4 reps (1 LOB with Lt stance, min A to correct); tandem stance 20 sec each side (Lt challenging); semi-tandem stance with horizontal head turns (improved from last session);  Toe taps to cup on top 3" step without UE support x 10 reps each leg;  repeated but alternating feet (improved with repetition).    Knee/Hip Exercises: Aerobic   Stationary Bike NuStep L3: 75min    Knee/Hip Exercises: Standing   Other Standing Knee Exercises sit to stand no UE support x 10 reps - slow controlled descent   Knee/Hip Exercises: Supine   Straight Leg Raises Right;1 set;10 reps  (challenging)    Knee/Hip Exercises: Sidelying   Clams 2 sets of 10 each leg   Knee/Hip Exercises: Prone   Hip Extension Left;2 sets;10 reps   Moist Heat Therapy   Number Minutes Moist Heat 12 Minutes   Moist Heat Location Knee   Manual Therapy   Manual Therapy Taping   Manual therapy comments Kinesiotape applied to anterior/lateral portion of Lt proximal tibia to decrease pain and bruising.            PT Long Term Goals - 10/14/14 1558    PT LONG TERM GOAL #1   Title I with advanced HEP - (10/24/14)   Time  4   Period Weeks   Status On-going   PT LONG TERM GOAL #2   Title increase hip strength bilat =/> 5-/5 to allow her to transfer floor to stand without difficulty - (10/24/14)   Time 4   Period Weeks   Status Partially Met   PT LONG TERM GOAL #3   Title increase bilat knee strength =/> 5-/5 to allow good eccentric control with stairs - (10/24/14)   Time 4   Period Weeks   Status Achieved   PT LONG TERM GOAL #4   Title improve FOTO =/< CK level ( 48% limited) - (10/24/14)   Period Weeks   Status On-going   PT LONG TERM GOAL #5   Title report =/> 75% decrease of pain in Rt knee while driving ( 4/49/20)   Time 4   Period Weeks   Status Achieved   PT LONG TERM GOAL #6   Title transfer sit to/from stand without difficult and =/> 75% less pain (10/24/14)   Time 4   Period Weeks   Status Achieved   PT LONG TERM GOAL #7   Title improve DGI score =/> 20/24 ( 10/24/14)   Time 2   Period Weeks   Status On-going               Plan - 10/14/14 1534    Clinical Impression Statement Pt demonstrated improved knee strength; continued weakness in Lt hamstring and Rt hip flexor.  Pt demonstrated improved balance with toe taps and tandem stance this date. Pt has met LTG # 3   Pt will benefit from skilled therapeutic intervention in order to improve on the following deficits Decreased strength;Pain;Abnormal gait   Rehab Potential Excellent   PT Frequency 2x / week   PT Duration 4 weeks   PT Treatment/Interventions Moist Heat;Patient/family education;Therapeutic exercise;Ultrasound;Gait training;Balance training;Manual techniques;Stair training;Cryotherapy;Electrical Stimulation   PT Next Visit Plan Continue strengthening of BLE and proprioception/balance exercises.    Consulted and Agree with Plan of Care Patient        Problem List Patient Active Problem List   Diagnosis Date Noted  . Insomnia with sleep apnea 03/22/2014  . Adjustment disorder with mixed anxiety and depressed mood  03/22/2014  . Sleep apnea with use of continuous positive airway pressure (CPAP) 10/19/2012  . Multifactorial gait disorder     Kerin Perna, PTA 10/14/2014 3:59 PM  [facility] [place], [name], [address] Phone: [phone]  Fax:  [phone]

## 2014-10-17 ENCOUNTER — Ambulatory Visit (INDEPENDENT_AMBULATORY_CARE_PROVIDER_SITE_OTHER): Payer: Medicare Other | Admitting: Physical Therapy

## 2014-10-17 DIAGNOSIS — R531 Weakness: Secondary | ICD-10-CM | POA: Diagnosis not present

## 2014-10-17 DIAGNOSIS — M25561 Pain in right knee: Secondary | ICD-10-CM | POA: Diagnosis not present

## 2014-10-17 DIAGNOSIS — M25562 Pain in left knee: Secondary | ICD-10-CM

## 2014-10-17 DIAGNOSIS — R269 Unspecified abnormalities of gait and mobility: Secondary | ICD-10-CM

## 2014-10-17 NOTE — Therapy (Signed)
[place], [name], [address] Phone: [phone]   Fax:  [phone]  Physical Therapy Treatment  Patient Details  Name: Melinda [name] MRN: [id number] Date of Birth: 08-Jul-1931 Referring Provider:  Leandrew Koyanagi, MD  Encounter Date: 10/17/2014      PT End of Session - 10/17/14 1552    Visit Number 23   Number of Visits 24   Date for PT Re-Evaluation 10/24/14   PT Start Time 5643   PT Stop Time 1632   PT Time Calculation (min) 54 min      Past Medical History  Diagnosis Date  . Hypercholesterolemia   . Acid reflux   . Obstructive sleep apnea   . Depression   . Insomnia   . Premature atrial contractions   . Anxiety   . Hypertension   . Cataract     surgery,bilateral  . Pyloric antral stenosis     in childhood,infancy  . Multifactorial gait disorder   . Sleep apnea with use of continuous positive airway pressure (CPAP) 10/19/2012    Past Surgical History  Procedure Laterality Date  . Abdominal hysterectomy  1970  . Transthoracic echocardiogram  07/23/2009    mild concentric LVH/mild mitral insufficiency/ moderate tricuspid innsufficiency/mild pulmonary HTN/EF- 55-60%  . Nm myoview ltd  11/30/2007    normal stress nuclear study/EF-83%  . Vaginal delivery    . Inner ear surgery Left     There were no vitals filed for this visit.  Visit Diagnosis:  Pain in knee joint, left  Weakness generalized  Pain in knee joint, right  Abnormality of gait      Subjective Assessment - 10/17/14 1555    Subjective No new changes. New balance exercises at home are getting easier.    Currently in Pain? Yes   Pain Score 2    Pain Location Knee   Pain Orientation Left;Right   Aggravating Factors  walking/kneeling    Pain Relieving Factors rest, heat             [facility] PT Assessment - 10/17/14 0001    Assessment   Medical Diagnosis Lt and Rt knee OA   Onset Date/Surgical Date  07/22/12   Next MD Visit not scheduled                     [facility] Adult PT Treatment/Exercise - 10/17/14 0001    High Level Balance   High Level Balance Activities Head turns;Side stepping;Marching forwards   High Level Balance Comments Toe taps to cone on 3" step x 10 reps each leg, repeated alternating R/L x 10 (improved from last visit);  Forward stepping over 3" cup alternating leading foot (occasional LOB with Lt stance); Side stepping over 3" obstacle x 6 ft x 6 trials.   Walking 25 ft x 4 reps with horizontal head turns (decreased veering; corrected same side LOB with step correction)    Knee/Hip Exercises: Stretches   Sports administrator Right;Left;3 reps;30 seconds   ITB Stretch Left;Right;2 reps;30 seconds   Gastroc Stretch Right;Left;2 reps;30 seconds   Knee/Hip Exercises: Aerobic   Stationary Bike NuStep L3: 5 min    Knee/Hip Exercises: Standing   Heel Raises Both;2 sets;10 reps   Other Standing Knee Exercises sit to stand no UE support x 20 reps to low black mat - slow controlled descent   Moist Heat Therapy   Number Minutes Moist Heat 12 Minutes   Moist Heat Location  Knee  bilateral                     PT Long Term Goals - 10/14/14 1558    PT LONG TERM GOAL #1   Title I with advanced HEP - (10/24/14)   Time 4   Period Weeks   Status On-going   PT LONG TERM GOAL #2   Title increase hip strength bilat =/> 5-/5 to allow her to transfer floor to stand without difficulty - (10/24/14)   Time 4   Period Weeks   Status Partially Met   PT LONG TERM GOAL #3   Title increase bilat knee strength =/> 5-/5 to allow good eccentric control with stairs - (10/24/14)   Time 4   Period Weeks   Status Achieved   PT LONG TERM GOAL #4   Title improve FOTO =/< CK level ( 48% limited) - (10/24/14)   Period Weeks   Status On-going   PT LONG TERM GOAL #5   Title report =/> 75% decrease of pain in Rt knee while driving ( 09/05/14)   Time 4   Period Weeks   Status  Achieved   PT LONG TERM GOAL #6   Title transfer sit to/from stand without difficult and =/> 75% less pain (10/24/14)   Time 4   Period Weeks   Status Achieved   PT LONG TERM GOAL #7   Title improve DGI score =/> 20/24 ( 10/24/14)   Time 2   Period Weeks   Status On-going               Plan - 10/17/14 1802    Clinical Impression Statement Pt progressing well towards remaining goals. Pt reported decreased knee pain with ther ex today.  Pt also demonstrated improved balance with repetition of standing balance activities.    Pt will benefit from skilled therapeutic intervention in order to improve on the following deficits Decreased strength;Pain;Abnormal gait   Rehab Potential Excellent   PT Frequency 2x / week   PT Duration 4 weeks   PT Treatment/Interventions Moist Heat;Patient/family education;Therapeutic exercise;Ultrasound;Gait training;Balance training;Manual techniques;Stair training;Cryotherapy;Electrical Stimulation   PT Next Visit Plan Reassess DGI.  Assess readiness for d/c to HEP; FOTO.   PT Home Exercise Plan pt to purchase strap to perform quad / HS stretches   Consulted and Agree with Plan of Care Patient        Problem List Patient Active Problem List   Diagnosis Date Noted  . Insomnia with sleep apnea 03/22/2014  . Adjustment disorder with mixed anxiety and depressed mood 03/22/2014  . Sleep apnea with use of continuous positive airway pressure (CPAP) 10/19/2012  . Multifactorial gait disorder    Kerin Perna, PTA 10/17/2014 6:05 PM  [place], [name], [address] Phone: [phone]   Fax:  [phone]

## 2014-10-23 ENCOUNTER — Ambulatory Visit (INDEPENDENT_AMBULATORY_CARE_PROVIDER_SITE_OTHER): Payer: Medicare Other | Admitting: Physical Therapy

## 2014-10-23 DIAGNOSIS — R531 Weakness: Secondary | ICD-10-CM | POA: Diagnosis present

## 2014-10-23 DIAGNOSIS — M25562 Pain in left knee: Secondary | ICD-10-CM

## 2014-10-23 DIAGNOSIS — M25561 Pain in right knee: Secondary | ICD-10-CM | POA: Diagnosis not present

## 2014-10-23 NOTE — Therapy (Signed)
[place], [name], [address] Phone: [phone]   Fax:  [phone]  Physical Therapy Treatment  Patient Details  Name: Melinda [name] MRN: [id number] Date of Birth: [birth year] Referring Provider:  Leandrew Koyanagi, MD  Encounter Date: 10/23/2014      PT End of Session - 10/23/14 1104    Visit Number 24   Number of Visits 24   Date for PT Re-Evaluation 10/24/14   PT Start Time 1101   PT Stop Time 1140   PT Time Calculation (min) 39 min   Activity Tolerance Patient tolerated treatment well;No increased pain      Past Medical History  Diagnosis Date  . Hypercholesterolemia   . Acid reflux   . Obstructive sleep apnea   . Depression   . Insomnia   . Premature atrial contractions   . Anxiety   . Hypertension   . Cataract     surgery,bilateral  . Pyloric antral stenosis     in childhood,infancy  . Multifactorial gait disorder   . Sleep apnea with use of continuous positive airway pressure (CPAP) 10/19/2012    Past Surgical History  Procedure Laterality Date  . Abdominal hysterectomy  1970  . Transthoracic echocardiogram  07/23/2009    mild concentric LVH/mild mitral insufficiency/ moderate tricuspid innsufficiency/mild pulmonary HTN/EF- 55-60%  . Nm myoview ltd  11/30/2007    normal stress nuclear study/EF-83%  . Vaginal delivery    . Inner ear surgery Left     There were no vitals filed for this visit.  Visit Diagnosis:  Weakness generalized  Pain in knee joint, right  Pain in knee joint, left      Subjective Assessment - 10/23/14 1104    Subjective Pt reports she is feeling pretty good today.     Currently in Pain? No/denies            [facility] PT Assessment - 10/23/14 0001    Assessment   Medical Diagnosis Lt and Rt knee OA   Onset Date/Surgical Date 07/22/12   Next MD Visit not scheduled   Observation/Other Assessments   Focus on Therapeutic Outcomes (FOTO)  49%  limited    Strength   Right/Left Hip Right;Left   Right Hip Flexion --  5-/5   Right Hip Extension 5/5   Right Hip ABduction --  5-/5   Left Hip Flexion 5/5   Left Hip Extension --  5-/5   Left Hip ABduction --  5-/5   Right Knee Flexion 5/5   Right Knee Extension 5/5   Left Knee Flexion 5/5   Left Knee Extension 5/5   Flexibility   Soft Tissue Assessment /Muscle Length yes   Quadriceps Prone, Lt heel 3.5" from buttocks, Rt heel 4" from buttock   Dynamic Gait Index   Level Surface Normal   Change in Gait Speed Normal   Gait with Horizontal Head Turns Mild Impairment  slight veer Lt with Lt head turn   Gait with Vertical Head Turns Mild Impairment  slows with looking down   Gait and Pivot Turn Normal   Step Over Obstacle Mild Impairment  slows down to perform   Step Around Obstacles Normal   Steps Normal   Total Score 21                     [facility] Adult PT Treatment/Exercise - 10/23/14 0001    Knee/Hip Exercises: Stretches   Passive Hamstring Stretch Right;Left;2  reps;30 seconds   Quad Stretch Right;Left;2 reps;60 seconds   Knee/Hip Exercises: Aerobic   Stationary Bike NuStep L3: 3.5 min    Tread Mill 1.6 mph x 2 min    Knee/Hip Exercises: Standing   SLS Toe taps to 3" cup on 6" step x 8 reps each leg, then alternating x 10 (improved balance from previous visit)   Other Standing Knee Exercises sit to stand no UE support x 20 reps to low black mat - slow controlled descent   Knee/Hip Exercises: Seated   Long Arc Quad Left;Right;1 set;10 reps  5 sec hold in extension    Modalities   Modalities --  pt declined; no pain at end of session                PT Education - 10/23/14 1147    Education provided Yes   Education Details Reviewed HEP. Added seated HS stretch   Person(s) Educated Patient   Methods Explanation;Handout;Demonstration   Comprehension Verbalized understanding;Returned demonstration             PT Long Term Goals -  10/23/14 1143    PT LONG TERM GOAL #1   Title I with advanced HEP - (10/24/14)   Time 4   Period Weeks   Status Achieved   PT LONG TERM GOAL #2   Title increase hip strength bilat =/> 5-/5 to allow her to transfer floor to stand without difficulty - (10/24/14)   Time 4   Period Weeks   Status Achieved   PT LONG TERM GOAL #3   Title increase bilat knee strength =/> 5-/5 to allow good eccentric control with stairs - (10/24/14)   Time 4   Period Weeks   Status Achieved   PT LONG TERM GOAL #4   Title improve FOTO =/< CK level ( 48% limited) - (10/24/14)   Time 4   Period Weeks   Status Not Met  scored 49%.    PT LONG TERM GOAL #5   Title report =/> 75% decrease of pain in Rt knee while driving ( 10/16/89)   Time 4   Period Weeks   Status Achieved   PT LONG TERM GOAL #6   Title transfer sit to/from stand without difficult and =/> 75% less pain (10/24/14)   Time 4   Period Weeks   Status Achieved   PT LONG TERM GOAL #7   Title improve DGI score =/> 20/24 ( 10/24/14)   Time 2   Period Weeks   Status Achieved               Plan - 10/23/14 1118    Clinical Impression Statement Pt demo improved LE strength and quadricep flexibilty.  Pt scored on DGI, demonstrating improved balance and decreased risk of falls.  Pt has met all but her FOTO goal, 1% difference. Pt satisfied with current level of function.    Pt will benefit from skilled therapeutic intervention in order to improve on the following deficits Decreased strength;Pain;Abnormal gait   Rehab Potential Excellent   PT Frequency 2x / week   PT Duration 4 weeks   PT Treatment/Interventions Moist Heat;Patient/family education;Therapeutic exercise;Ultrasound;Gait training;Balance training;Manual techniques;Stair training;Cryotherapy;Electrical Stimulation   PT Next Visit Plan Spoke to supervising PT; will d/c to HEP.    Consulted and Agree with Plan of Care Patient        Problem List Patient Active Problem List    Diagnosis Date Noted  . Insomnia with sleep apnea 03/22/2014  .  Adjustment disorder with mixed anxiety and depressed mood 03/22/2014  . Sleep apnea with use of continuous positive airway pressure (CPAP) 10/19/2012  . Multifactorial gait disorder     Kerin Perna, PTA 10/23/2014 5:26 PM  [facility] [place], [name], [address] Phone: [phone]   Fax:  [phone]  PHYSICAL THERAPY DISCHARGE SUMMARY  Visits from Start of Care: 24  Current functional level related to goals / functional outcomes: See above   Remaining deficits: None as pertains to this episode of care   Education / Equipment: HEP Plan: Patient agrees to discharge.  Patient goals were partially met. Patient is being discharged due to being pleased with the current functional level. Pt did not meet one goal, missed it by 1%????    Jeral Pinch, PT 10/23/2014 5:28 PM

## 2014-12-23 ENCOUNTER — Other Ambulatory Visit: Payer: Self-pay

## 2014-12-23 DIAGNOSIS — Z1231 Encounter for screening mammogram for malignant neoplasm of breast: Secondary | ICD-10-CM

## 2015-01-17 ENCOUNTER — Ambulatory Visit: Payer: Medicare Other

## 2015-02-06 ENCOUNTER — Ambulatory Visit
Admission: RE | Admit: 2015-02-06 | Discharge: 2015-02-06 | Disposition: A | Discharge status: Home / Self Care | Payer: Medicare Other | Source: Ambulatory Visit

## 2015-02-06 DIAGNOSIS — Z1231 Encounter for screening mammogram for malignant neoplasm of breast: Secondary | ICD-10-CM

## 2015-02-10 ENCOUNTER — Other Ambulatory Visit: Payer: Self-pay | Admitting: Internal Medicine

## 2015-02-10 DIAGNOSIS — R928 Other abnormal and inconclusive findings on diagnostic imaging of breast: Secondary | ICD-10-CM

## 2015-02-17 ENCOUNTER — Other Ambulatory Visit: Payer: Self-pay | Admitting: Internal Medicine

## 2015-02-17 ENCOUNTER — Ambulatory Visit
Admission: RE | Admit: 2015-02-17 | Discharge: 2015-02-17 | Disposition: A | Discharge status: Home / Self Care | Payer: Medicare Other | Source: Ambulatory Visit | Attending: Internal Medicine | Admitting: Internal Medicine

## 2015-02-17 DIAGNOSIS — R928 Other abnormal and inconclusive findings on diagnostic imaging of breast: Secondary | ICD-10-CM

## 2015-02-21 ENCOUNTER — Other Ambulatory Visit: Payer: Self-pay | Admitting: Internal Medicine

## 2015-02-21 DIAGNOSIS — R928 Other abnormal and inconclusive findings on diagnostic imaging of breast: Secondary | ICD-10-CM

## 2015-02-24 ENCOUNTER — Ambulatory Visit
Admission: RE | Admit: 2015-02-24 | Discharge: 2015-02-24 | Disposition: A | Discharge status: Home / Self Care | Payer: Medicare Other | Source: Ambulatory Visit | Attending: Internal Medicine | Admitting: Internal Medicine

## 2015-02-24 DIAGNOSIS — R928 Other abnormal and inconclusive findings on diagnostic imaging of breast: Secondary | ICD-10-CM

## 2015-03-09 HISTORY — PX: BREAST BIOPSY: SHX20

## 2015-03-24 ENCOUNTER — Ambulatory Visit: Payer: Medicare Other | Admitting: Neurology

## 2015-03-27 ENCOUNTER — Ambulatory Visit (INDEPENDENT_AMBULATORY_CARE_PROVIDER_SITE_OTHER): Payer: Medicare HMO | Admitting: Neurology

## 2015-03-27 ENCOUNTER — Encounter: Payer: Self-pay | Admitting: Neurology

## 2015-03-27 VITALS — BP 142/70 | HR 66 | Resp 20 | Ht 60.0 in | Wt 175.0 lb

## 2015-03-27 DIAGNOSIS — G4700 Insomnia, unspecified: Secondary | ICD-10-CM | POA: Diagnosis not present

## 2015-03-27 DIAGNOSIS — G4733 Obstructive sleep apnea (adult) (pediatric): Secondary | ICD-10-CM | POA: Diagnosis not present

## 2015-03-27 DIAGNOSIS — W19XXXD Unspecified fall, subsequent encounter: Secondary | ICD-10-CM

## 2015-03-27 DIAGNOSIS — W19XXXA Unspecified fall, initial encounter: Secondary | ICD-10-CM | POA: Insufficient documentation

## 2015-03-27 DIAGNOSIS — R296 Repeated falls: Secondary | ICD-10-CM | POA: Insufficient documentation

## 2015-03-27 DIAGNOSIS — Z9989 Dependence on other enabling machines and devices: Secondary | ICD-10-CM | POA: Insufficient documentation

## 2015-03-27 DIAGNOSIS — R270 Ataxia, unspecified: Secondary | ICD-10-CM | POA: Diagnosis not present

## 2015-03-27 MED ORDER — TEMAZEPAM 7.5 MG PO CAPS: 7.5000 mg | ORAL_CAPSULE | Freq: Every day | ORAL | Status: DC

## 2015-03-27 NOTE — Progress Notes (Signed)
[facility]  PATIENT: Melinda [name] DOB: 1931/08/29   REASON FOR VISIT: Followup for sleep apnea and multifactorial gait disorder   HISTORY OF PRESENT ILLNESS: CM, last note 2015 :  Melinda [name] 82 year old female returns for followup. She patient reports that her gait problem has stabilized she's doing better had not had a fall for the months.  She occasionally can use a cane or walker on bad days but not very often.  She was finally diagnosed officially with lumbar spinal stenosis, she has lost some muscle mass all through the hamstrings and thigh, lost some core muscle strength. She had previously fallen many times onto her left knee which still gives her trouble at times   The patient has more pain in her knee when walking on tiptoes, she does not have foot drop . She says she has been told she has arthritis. She also complains of occasional burning paresthesias in the leg however this is very brief and once or twice a month. She does not wish to be placed on a medication. She remains on CPAP and she brought her machine for download. She does say that her machine is loud and old and she sometimes cuts it off due to the sound. She returns for reevaluation  03-22-14: Melinda [name] is here today for his yearly visit. She is an established CPAP user and endorsed today the Epworth sleepiness score at 4 points and fatigue severity score at 47 points.  A download of her CPAP was obtained today in the office showing a moderate to high air leak. However her apnea and hypopnea index is still well controlled and the patient is highly compliant. She is using CPAP at 10 cm water with a residual AHI of 6.8 and an average time of daily CPAP use of 7 hours and 17 minutes. She is considered 90% compliant by CMS criteria which is sufficient. The airleak however is some quite significant. The patient states that she notices a noise from the airleak and sometimes it will wake her up  her machine is an outer set of onset machine and I feel that there must be a more comfortable mask for her to use without creating air leaks. She has used a nasal pillow since last year when I suggested the change. She notices that the airleak is worse when the head gear slips up over her scalp and once she pulls it back down the air leak stops. For the airleak seems to be episodic and not a chronic fact. The patient's medication list and allergy list was reviewed with my nurse, before the patient was back to the [place] area she resided on [place] and she have to drive about 4 hours or longer to see her local physicians at that time she used armodafinil or Nuvigil to keep herself awake and safely driving. There is no needs to refill this medication now.  03-27-15  Patient here for a CPAP compliance visit. Melinda [name] has moved to the [facility] area to be closer to her physicians and medical system. She endorsed the geriatric depression score today at only 1., she endorsed fatigue severity at 46 points and the Epworth sleepiness score at 7 points. She assured me that she has been weak using her CPAP compliantly but the model is no longer supported by a manufacturer and I'm not longer able to get data downloads from the machine. Since I have followed Melinda [name] for about 8 years and this is the age  of her current machine. To assure that she has continued apnea care, I will invite her for a re-titration to CPAP.  I would like one hour of baseline sleep to document her current level of apnea. The patient's CPAP machine also has a no longer functioning heating and humidifier system. For this reason alone should be exchanged. I will asked the CPAP to make an appointment that works for Melinda [name] schedule.  With data from the sleep study we should be able to order a new asleep machine for her covered by her current provided [facility].       REVIEW OF SYSTEMS: Full 14 system review of systems  performed and notable only for those listed, all others are neg:  Constitutional: Fatigue  Ear/Nose/Throat: Hearing loss  Respiratory: cough, shortness of breath Gastroitestinal: Constipation  Hematology/Lymphatic: Easy bruising Musculoskeletal: Joint pain joint swelling, walking difficulty Psychiatric: Depression  Sleep :  Epworth 4 and FSS 47 points, chronic insomnia, has trouble to fall asleep. Usually reads t waiting to go to sleep.   ALLERGIES: Allergies  Allergen Reactions  . Phenobarbital Itching    HOME MEDICATIONS: Outpatient Prescriptions Prior to Visit  Medication Sig Dispense Refill  . acetaminophen (TYLENOL) 325 MG tablet Take 650 mg by mouth every 6 (six) hours as needed.    [name] amLODipine (NORVASC) 5 MG tablet Take 5 mg by mouth daily.    [name] aspirin 81 MG tablet Take 81 mg by mouth daily.    [name] atorvastatin (LIPITOR) 40 MG tablet Take 40 mg by mouth daily. One tablet in am    . benazepril (LOTENSIN) 20 MG tablet Take 20 mg by mouth daily.    . Blood Glucose Monitoring Suppl (ONE TOUCH ULTRA 2) W/DEVICE KIT     . calcium carbonate (OS-CAL) 600 MG TABS tablet Take 600 mg by mouth daily.    . cholecalciferol (VITAMIN D) 1000 UNITS tablet Take 1,000 Units by mouth daily.    [name] desipramine (NORPRAMIN) 25 MG tablet Take 25 mg by mouth daily.    [name] glucosamine-chondroitin 500-400 MG tablet Take 1 tablet by mouth 3 (three) times daily.    [name] glucose blood (ACCU-CHEK ACTIVE STRIPS) test strip 1 each by Other route as needed for other. Use as instructed    . metoprolol (LOPRESSOR) 50 MG tablet Take 1/2 tablet by mouth twice daily    . mupirocin ointment (BACTROBAN) 2 % Place 1 application into the nose as needed.    . Omega-3 Fatty Acids (FISH OIL) 1200 MG CAPS Take by mouth daily.    [name] omeprazole (PRILOSEC) 20 MG capsule Take 20 mg by mouth daily. One tablet daily in mornings    . ONETOUCH DELICA LANCETS 23J MISC     . temazepam (RESTORIL) 7.5 MG capsule Take 7.5 mg by mouth daily.  Take 1-2 as needed    . vitamin B-12 (CYANOCOBALAMIN) 500 MCG tablet Take 1,000 mcg by mouth daily.      No facility-administered medications prior to visit.    PAST MEDICAL HISTORY: Past Medical History  Diagnosis Date  . Hypercholesterolemia   . Acid reflux   . Obstructive sleep apnea   . Depression   . Insomnia   . Premature atrial contractions   . Anxiety   . Hypertension   . Cataract     surgery,bilateral  . Pyloric antral stenosis     in childhood,infancy  . Multifactorial gait disorder   . Sleep apnea with use of continuous positive airway pressure (CPAP)  10/19/2012    PAST SURGICAL HISTORY: Past Surgical History  Procedure Laterality Date  . Abdominal hysterectomy  1970  . Transthoracic echocardiogram  07/23/2009    mild concentric LVH/mild mitral insufficiency/ moderate tricuspid innsufficiency/mild pulmonary HTN/EF- 55-60%  . Nm myoview ltd  11/30/2007    normal stress nuclear study/EF-83%  . Vaginal delivery    . Inner ear surgery Left     FAMILY HISTORY: Family History  Problem Relation Age of Onset  . Heart attack Father   . Heart failure Mother   . Cancer Mother     colon,kidney  . Diabetes Sister   . Hypertension Sister     SOCIAL HISTORY: Social History   Social History  . Marital Status: Married    Spouse Name: N/A  . Number of Children: 1  . Years of Education: 13   Occupational History  . Retired    Social History Main Topics  . Smoking status: Never Smoker   . Smokeless tobacco: Never Used  . Alcohol Use: 0.0 oz/week    0 Standard drinks or equivalent per week     Comment: rare  . Drug Use: No  . Sexual Activity: Not on file   Other Topics Concern  . Not on file   Social History Narrative   Patient lives alone.    Patient is widowed.    Patient retired.    Patient some college.    Patient has one child.    Caffeine 1-2 cups daily avg.     PHYSICAL EXAM  Filed Vitals:   03/27/15 1035  BP: 142/70  Pulse: 66  Resp:  20  Height: 5' (1.524 m)  Weight: 175 lb (79.379 kg)   Body mass index is 34.18 kg/(m^2).   Neck circumference :   15.25 inches.  Mallampati: 3, with elongated uvular, red mucosa.    Nasal airflow : congestion, rhinitis . No TMJ pain nor retrognathia, dentures, partial upper.  Generalized: Well developed, obese female in no acute distress  Head: normocephalic and atraumatic.Oropharynx benign  Neck: Supple, no carotid bruits Cardiac: Regular rate rhythm, no murmur  Neurological examination  Mentation: Alert oriented to time, place, history taking. Follows all commands speech and language fluent. ESS 11  Cranial nerve : Pupils were equal round reactive to light extraocular movements were full, visual field were full on confrontational test. Facial sensation and strength were normal. Hearing was intact to finger rubbing bilaterally. Uvula tongue midline. head turning and shoulder shrug and were normal and symmetric.Tongue protrusion into cheek strength was normal.  Motor: normal bulk and tone, full strength in the BUE , fine finger movements normal, no pronator drift. No focal weakness  Sensory: normal and symmetric to light touch, vibration and proprioception. Decreased pinprick to mid foot bilaterally  Coordination: finger-nose-finger, heel-to-shin bilaterally, no dysmetria , no tremor. Reflexes: 2/2, plantar responses were flexor bilaterally.  Gait and Station: Rising up from seated position without assistance, Patient reports frequent falls.   normal stance, moderate stride, good arm swing, smooth turning, able to perform tiptoe, and heel walking with only minor  difficulty.  andem gait mildly unsteady   DIAGNOSTIC DATA (LABS, IMAGING, TESTING)   The patient describes staggering gait with a higher fall risk but denies feeling dizzy. She has completed a course of physical therapy but it has not necessarily stabilized her gait or reduced her fall risk she feels. We're today here to review her  CPAP data, but I am unable to obtain a download. Her  machine's heating plate is not longer working, the machine is 80 years old and needs to be replaced.   ASSESSMENT AND PLAN   Obstructive sleep apnea with the use of continuous positive airway pressure; she will stay on nasal pillow but will address the headset fit with DME.  Insomnia;  chronic  OSA on CPAP.  Needs new machine , has been for many years compliant but now not able to document compliance on the meanwhile discontinued model . I will advise a retitration in office to document need for new machine.   Multifactorial gait disorder . Not addressed today.   RV after sleep study:  [facility] [address], [place], [place] [address] [phone]

## 2015-05-08 ENCOUNTER — Ambulatory Visit (INDEPENDENT_AMBULATORY_CARE_PROVIDER_SITE_OTHER): Payer: Medicare HMO | Admitting: Neurology

## 2015-05-08 DIAGNOSIS — G4733 Obstructive sleep apnea (adult) (pediatric): Secondary | ICD-10-CM

## 2015-05-08 DIAGNOSIS — R270 Ataxia, unspecified: Secondary | ICD-10-CM

## 2015-05-08 DIAGNOSIS — W19XXXD Unspecified fall, subsequent encounter: Secondary | ICD-10-CM

## 2015-05-08 DIAGNOSIS — Z9989 Dependence on other enabling machines and devices: Secondary | ICD-10-CM

## 2015-05-09 NOTE — Sleep Study (Signed)
Please see the scanned sleep study interpretation located in the procedure tab in the chart view section.  

## 2015-05-13 ENCOUNTER — Telehealth: Payer: Self-pay

## 2015-05-13 DIAGNOSIS — G4733 Obstructive sleep apnea (adult) (pediatric): Secondary | ICD-10-CM

## 2015-05-13 DIAGNOSIS — Z9989 Dependence on other enabling machines and devices: Secondary | ICD-10-CM

## 2015-05-13 DIAGNOSIS — G4700 Insomnia, unspecified: Secondary | ICD-10-CM

## 2015-05-13 DIAGNOSIS — R270 Ataxia, unspecified: Secondary | ICD-10-CM

## 2015-05-13 NOTE — Telephone Encounter (Signed)
I spoke to patient and she is aware of results and recommendations. She is willing to proceed with titration study. I will put order in. And fax report to PCP. Dr. Brett Fairy asks that this be scheduled ASAP due to drops in O2 levels.

## 2015-05-14 NOTE — Telephone Encounter (Signed)
I wanted the patient to be titrated -  Needs new equipment- she has CPAP and has been hypoxemic. CD

## 2015-05-19 ENCOUNTER — Telehealth: Payer: Self-pay | Admitting: Cardiology

## 2015-05-19 NOTE — Telephone Encounter (Signed)
Received records from [facility] for appointment on 07/08/15 with Dr Martinique.  Records given to [facility] (medical records) for Dr Doug Sou schedule on 07/08/15. lp

## 2015-05-28 ENCOUNTER — Ambulatory Visit (INDEPENDENT_AMBULATORY_CARE_PROVIDER_SITE_OTHER): Payer: Medicare HMO | Admitting: Neurology

## 2015-05-28 DIAGNOSIS — G4700 Insomnia, unspecified: Secondary | ICD-10-CM

## 2015-05-28 DIAGNOSIS — R270 Ataxia, unspecified: Secondary | ICD-10-CM

## 2015-05-28 DIAGNOSIS — G4733 Obstructive sleep apnea (adult) (pediatric): Secondary | ICD-10-CM

## 2015-05-28 DIAGNOSIS — Z9989 Dependence on other enabling machines and devices: Secondary | ICD-10-CM

## 2015-05-28 NOTE — Sleep Study (Signed)
Please see the scanned sleep study interpretation located in the procedure tab in the chart view section.  

## 2015-06-04 ENCOUNTER — Telehealth: Payer: Self-pay

## 2015-06-04 DIAGNOSIS — G4733 Obstructive sleep apnea (adult) (pediatric): Secondary | ICD-10-CM

## 2015-06-04 NOTE — Telephone Encounter (Signed)
Spoke to pt regarding her sleep study results. I advised her that her sleep study showed significant improvement with less respiratory events during titration and Dr. Brett Fairy recommends starting an auto-pap. Pt is agreeable. Pt requested that the order be sent to [facility]. I advised pt that nocturnal oxygen can be considered if patient continues to experience low oxygen levels. An ONO will be performed within 30 days of cpap use. Pt verbalized understanding.

## 2015-06-13 ENCOUNTER — Other Ambulatory Visit: Payer: Self-pay | Admitting: Orthopaedic Surgery

## 2015-06-24 ENCOUNTER — Other Ambulatory Visit (HOSPITAL_COMMUNITY): Payer: Self-pay | Admitting: *Deleted

## 2015-06-24 ENCOUNTER — Encounter (HOSPITAL_COMMUNITY): Payer: Self-pay

## 2015-06-24 ENCOUNTER — Encounter (HOSPITAL_COMMUNITY)
Admission: RE | Admit: 2015-06-24 | Discharge: 2015-06-24 | Disposition: A | Discharge status: Home / Self Care | Payer: Medicare HMO | Source: Ambulatory Visit | Attending: Orthopaedic Surgery | Admitting: Orthopaedic Surgery

## 2015-06-24 HISTORY — DX: Type 2 diabetes mellitus without complications: E11.9

## 2015-06-24 HISTORY — DX: Constipation, unspecified: K59.00

## 2015-06-24 HISTORY — DX: Family history of other specified conditions: Z84.89

## 2015-06-24 HISTORY — DX: Unspecified osteoarthritis, unspecified site: M19.90

## 2015-06-24 HISTORY — DX: Personal history of urinary (tract) infections: Z87.440

## 2015-06-24 LAB — CBC WITH DIFFERENTIAL/PLATELET
BASOS ABS: 0 10*3/uL (ref 0.0–0.1)
BASOS PCT: 1 %
EOS PCT: 3 %
Eosinophils Absolute: 0.2 10*3/uL (ref 0.0–0.7)
HCT: 41.2 % (ref 36.0–46.0)
Hemoglobin: 13.8 g/dL (ref 12.0–15.0)
LYMPHS PCT: 29 %
Lymphs Abs: 1.6 10*3/uL (ref 0.7–4.0)
MCH: 30.9 pg (ref 26.0–34.0)
MCHC: 33.5 g/dL (ref 30.0–36.0)
MCV: 92.2 fL (ref 78.0–100.0)
Monocytes Absolute: 0.6 10*3/uL (ref 0.1–1.0)
Monocytes Relative: 10 %
NEUTROS ABS: 3.2 10*3/uL (ref 1.7–7.7)
Neutrophils Relative %: 57 %
PLATELETS: 180 10*3/uL (ref 150–400)
RBC: 4.47 MIL/uL (ref 3.87–5.11)
RDW: 12.6 % (ref 11.5–15.5)
WBC: 5.6 10*3/uL (ref 4.0–10.5)

## 2015-06-24 LAB — TYPE AND SCREEN
ABO/RH(D): B NEG
Antibody Screen: NEGATIVE

## 2015-06-24 LAB — URINE MICROSCOPIC-ADD ON: RBC / HPF: NONE SEEN RBC/hpf (ref 0–5)

## 2015-06-24 LAB — APTT: APTT: 29 s (ref 24–37)

## 2015-06-24 LAB — COMPREHENSIVE METABOLIC PANEL
ALBUMIN: 4 g/dL (ref 3.5–5.0)
ALT: 20 U/L (ref 14–54)
ANION GAP: 9 (ref 5–15)
AST: 20 U/L (ref 15–41)
Alkaline Phosphatase: 74 U/L (ref 38–126)
BUN: 16 mg/dL (ref 6–20)
CHLORIDE: 104 mmol/L (ref 101–111)
CO2: 26 mmol/L (ref 22–32)
Calcium: 10 mg/dL (ref 8.9–10.3)
Creatinine, Ser: 0.85 mg/dL (ref 0.44–1.00)
GFR calc Af Amer: >60 mL/min (ref >60)
GLUCOSE: 96 mg/dL (ref 65–99)
Potassium: 4.3 mmol/L (ref 3.5–5.1)
Sodium: 139 mmol/L (ref 135–145)
Total Bilirubin: 0.8 mg/dL (ref 0.3–1.2)
Total Protein: 7.1 g/dL (ref 6.5–8.1)

## 2015-06-24 LAB — SURGICAL PCR SCREEN
MRSA, PCR: NEGATIVE
Staphylococcus aureus: NEGATIVE

## 2015-06-24 LAB — ABO/RH: ABO/RH(D): B NEG

## 2015-06-24 LAB — URINALYSIS, ROUTINE W REFLEX MICROSCOPIC
BILIRUBIN URINE: NEGATIVE
GLUCOSE, UA: NEGATIVE mg/dL
HGB URINE DIPSTICK: NEGATIVE
KETONES UR: NEGATIVE mg/dL
Nitrite: NEGATIVE
PROTEIN: NEGATIVE mg/dL
Specific Gravity, Urine: 1.01 (ref 1.005–1.030)
pH: 7 (ref 5.0–8.0)

## 2015-06-24 LAB — C-REACTIVE PROTEIN: CRP: 0.5 mg/dL (ref <1.0)

## 2015-06-24 LAB — PROTIME-INR
INR: 1.03 (ref 0.00–1.49)
Prothrombin Time: 13.7 seconds (ref 11.6–15.2)

## 2015-06-24 LAB — SEDIMENTATION RATE: Sed Rate: 12 mm/hr (ref 0–22)

## 2015-06-24 LAB — GLUCOSE, CAPILLARY: Glucose-Capillary: 124 mg/dL — ABNORMAL HIGH (ref 65–99)

## 2015-06-24 NOTE — Progress Notes (Signed)
Pt states the only heart issue she's ever had was some "extra beats". It's noted in her history she has premature atrial contractions. Dr. Abner Greenspan is her PCP. She is diabetic, not on medications at this time. Her CBG was 124 today. States her fasting blood sugar has been running between 130-145. Her last A1C was 6.9, she thinks a month ago. Have requested result from Dr. Dutch Gray office. Also requested most recent EKG and last office visit notes.   Stress - 11/30/07 - in EPIC Echo - 07/13/09 - in EPIC

## 2015-06-24 NOTE — Pre-Procedure Instructions (Signed)
Melinda [name]  06/24/2015     Your procedure is scheduled on Thursday, June 26, 2015 at 12:15 PM.   Report to [facility] Entrance "A" Admitting Office at 10:15 AM.   Call this number if you have problems the morning of surgery: [phone]   Any questions prior to day of surgery, please call [phone] between 8 & 4 PM.   Remember:  Do not eat food or drink liquids after midnight Wednesday, 06/25/15.  Take these medicines the morning of surgery with A SIP OF WATER: Amlodipine (Norvasc), Desipramine (Norpramin), Metoprolol (Lopressor), Omeprazole (Prilosec), Tylenol - if needed  Stop Fish Oil and Herbal Medications as of today.   Do not wear jewelry, make-up or nail polish.  Do not wear lotions, powders, or perfumes.  You may wear deodorant.  Do not shave 48 hours prior to surgery.    Do not bring valuables to the hospital.  [facility] is not responsible for any belongings or valuables.  Contacts, dentures or bridgework may not be worn into surgery.  Leave your suitcase in the car.  After surgery it may be brought to your room.  For patients admitted to the hospital, discharge time will be determined by your treatment team.  Special instructions:  [place] - Preparing for Surgery  Before surgery, you can play an important role.  Because skin is not sterile, your skin needs to be as free of germs as possible.  You can reduce the number of germs on you skin by washing with CHG (chlorahexidine gluconate) soap before surgery.  CHG is an antiseptic cleaner which kills germs and bonds with the skin to continue killing germs even after washing.  Please DO NOT use if you have an allergy to CHG or antibacterial soaps.  If your skin becomes reddened/irritated stop using the CHG and inform your nurse when you arrive at Short Stay.  Do not shave (including legs and underarms) for at least 48 hours prior to the first CHG shower.  You may shave your face.  Please follow  these instructions carefully:   1.  Shower with CHG Soap the night before surgery and the                                morning of Surgery.  2.  If you choose to wash your hair, wash your hair first as usual with your       normal shampoo.  3.  After you shampoo, rinse your hair and body thoroughly to remove the                      Shampoo.  4.  Use CHG as you would any other liquid soap.  You can apply chg directly       to the skin and wash gently with scrungie or a clean washcloth.  5.  Apply the CHG Soap to your body ONLY FROM THE NECK DOWN.        Do not use on open wounds or open sores.  Avoid contact with your eyes, ears, mouth and genitals (private parts).  Wash genitals (private parts) with your normal soap.  6.  Wash thoroughly, paying special attention to the area where your surgery        will be performed.  7.  Thoroughly rinse your body with warm water from the neck down.  8.  DO  NOT shower/wash with your normal soap after using and rinsing off       the CHG Soap.  9.  Pat yourself dry with a clean towel.            10.  Wear clean pajamas.            11.  Place clean sheets on your bed the night of your first shower and do not        sleep with pets.  Day of Surgery  Do not apply any lotions the morning of surgery.  Please wear clean clothes to the hospital.   Please read over the following fact sheets that you were given. Pain Booklet, Coughing and Deep Breathing, Blood Transfusion Information and Surgical Site Infection Prevention

## 2015-06-26 ENCOUNTER — Inpatient Hospital Stay (HOSPITAL_COMMUNITY): Payer: Medicare HMO

## 2015-06-26 ENCOUNTER — Inpatient Hospital Stay (HOSPITAL_COMMUNITY)
Admission: RE | Admit: 2015-06-26 | Discharge: 2015-06-30 | DRG: 470 | Disposition: A | Discharge status: Skilled Nursing Facility | Payer: Medicare HMO | Source: Ambulatory Visit | Attending: Orthopaedic Surgery | Admitting: Orthopaedic Surgery

## 2015-06-26 ENCOUNTER — Encounter (HOSPITAL_COMMUNITY)
Admission: RE | Disposition: A | Discharge status: Skilled Nursing Facility | Payer: Self-pay | Source: Ambulatory Visit | Attending: Orthopaedic Surgery

## 2015-06-26 ENCOUNTER — Encounter (HOSPITAL_COMMUNITY): Payer: Self-pay | Admitting: *Deleted

## 2015-06-26 ENCOUNTER — Inpatient Hospital Stay (HOSPITAL_COMMUNITY): Payer: Medicare HMO | Admitting: Anesthesiology

## 2015-06-26 DIAGNOSIS — D62 Acute posthemorrhagic anemia: Secondary | ICD-10-CM | POA: Diagnosis not present

## 2015-06-26 DIAGNOSIS — K219 Gastro-esophageal reflux disease without esophagitis: Secondary | ICD-10-CM | POA: Diagnosis present

## 2015-06-26 DIAGNOSIS — Z9841 Cataract extraction status, right eye: Secondary | ICD-10-CM

## 2015-06-26 DIAGNOSIS — Z8249 Family history of ischemic heart disease and other diseases of the circulatory system: Secondary | ICD-10-CM | POA: Diagnosis not present

## 2015-06-26 DIAGNOSIS — E119 Type 2 diabetes mellitus without complications: Secondary | ICD-10-CM | POA: Diagnosis present

## 2015-06-26 DIAGNOSIS — Z961 Presence of intraocular lens: Secondary | ICD-10-CM | POA: Diagnosis present

## 2015-06-26 DIAGNOSIS — I10 Essential (primary) hypertension: Secondary | ICD-10-CM | POA: Diagnosis present

## 2015-06-26 DIAGNOSIS — Z888 Allergy status to other drugs, medicaments and biological substances status: Secondary | ICD-10-CM | POA: Diagnosis not present

## 2015-06-26 DIAGNOSIS — E7800 Pure hypercholesterolemia, unspecified: Secondary | ICD-10-CM | POA: Diagnosis present

## 2015-06-26 DIAGNOSIS — M1611 Unilateral primary osteoarthritis, right hip: Principal | ICD-10-CM

## 2015-06-26 DIAGNOSIS — Z7982 Long term (current) use of aspirin: Secondary | ICD-10-CM

## 2015-06-26 DIAGNOSIS — Z419 Encounter for procedure for purposes other than remedying health state, unspecified: Secondary | ICD-10-CM

## 2015-06-26 DIAGNOSIS — Z79899 Other long term (current) drug therapy: Secondary | ICD-10-CM | POA: Diagnosis not present

## 2015-06-26 DIAGNOSIS — Z9842 Cataract extraction status, left eye: Secondary | ICD-10-CM

## 2015-06-26 DIAGNOSIS — Z833 Family history of diabetes mellitus: Secondary | ICD-10-CM | POA: Diagnosis not present

## 2015-06-26 DIAGNOSIS — Z96649 Presence of unspecified artificial hip joint: Secondary | ICD-10-CM

## 2015-06-26 DIAGNOSIS — G4733 Obstructive sleep apnea (adult) (pediatric): Secondary | ICD-10-CM | POA: Diagnosis present

## 2015-06-26 DIAGNOSIS — Z90710 Acquired absence of both cervix and uterus: Secondary | ICD-10-CM | POA: Diagnosis not present

## 2015-06-26 DIAGNOSIS — I340 Nonrheumatic mitral (valve) insufficiency: Secondary | ICD-10-CM | POA: Diagnosis present

## 2015-06-26 HISTORY — PX: TOTAL HIP ARTHROPLASTY: SHX124

## 2015-06-26 LAB — GLUCOSE, CAPILLARY
GLUCOSE-CAPILLARY: 117 mg/dL — AB (ref 65–99)
Glucose-Capillary: 95 mg/dL (ref 65–99)
Glucose-Capillary: 151 mg/dL — ABNORMAL HIGH (ref 65–99)

## 2015-06-26 SURGERY — ARTHROPLASTY, HIP, TOTAL, ANTERIOR APPROACH
Anesthesia: Spinal | Site: Hip | Laterality: Right

## 2015-06-26 MED ORDER — METHOCARBAMOL 500 MG PO TABS
500.0000 mg | ORAL_TABLET | Freq: Four times a day (QID) | ORAL | Status: DC | PRN
  Administered 2015-06-26 – 2015-06-29 (×4): 500 mg via ORAL
  Filled 2015-06-26 (×3): qty 1

## 2015-06-26 MED ORDER — ASPIRIN EC 325 MG PO TBEC: 325.0000 mg | DELAYED_RELEASE_TABLET | Freq: Two times a day (BID) | ORAL | Status: DC

## 2015-06-26 MED ORDER — MORPHINE SULFATE (PF) 2 MG/ML IV SOLN: 1.0000 mg | INTRAVENOUS | Status: DC | PRN

## 2015-06-26 MED ORDER — ACETAMINOPHEN 500 MG PO TABS
1000.0000 mg | ORAL_TABLET | Freq: Four times a day (QID) | ORAL | Status: AC
  Administered 2015-06-26 – 2015-06-27 (×3): 1000 mg via ORAL
  Filled 2015-06-26 (×3): qty 2

## 2015-06-26 MED ORDER — FENTANYL CITRATE (PF) 100 MCG/2ML IJ SOLN
INTRAMUSCULAR | Status: DC | PRN
  Administered 2015-06-26: 50 ug via INTRAVENOUS

## 2015-06-26 MED ORDER — OXYCODONE HCL ER 10 MG PO T12A
10.0000 mg | EXTENDED_RELEASE_TABLET | Freq: Two times a day (BID) | ORAL | Status: DC
  Administered 2015-06-26 – 2015-06-30 (×7): 10 mg via ORAL
  Filled 2015-06-26 (×8): qty 1

## 2015-06-26 MED ORDER — METOCLOPRAMIDE HCL 5 MG/ML IJ SOLN: 5.0000 mg | Freq: Three times a day (TID) | INTRAMUSCULAR | Status: DC | PRN

## 2015-06-26 MED ORDER — TEMAZEPAM 7.5 MG PO CAPS
7.5000 mg | ORAL_CAPSULE | Freq: Every day | ORAL | Status: DC
  Administered 2015-06-26 – 2015-06-29 (×4): 7.5 mg via ORAL
  Filled 2015-06-26 (×4): qty 1

## 2015-06-26 MED ORDER — CHLORHEXIDINE GLUCONATE 4 % EX LIQD: 60.0000 mL | Freq: Once | CUTANEOUS | Status: DC

## 2015-06-26 MED ORDER — INSULIN ASPART 100 UNIT/ML ~~LOC~~ SOLN
0.0000 [IU] | Freq: Three times a day (TID) | SUBCUTANEOUS | Status: DC
  Administered 2015-06-27: 3 [IU] via SUBCUTANEOUS
  Administered 2015-06-27 – 2015-06-30 (×6): 2 [IU] via SUBCUTANEOUS

## 2015-06-26 MED ORDER — HYDROMORPHONE HCL 1 MG/ML IJ SOLN
INTRAMUSCULAR | Status: AC
  Filled 2015-06-26: qty 1

## 2015-06-26 MED ORDER — PROPOFOL 500 MG/50ML IV EMUL
INTRAVENOUS | Status: DC | PRN
  Administered 2015-06-26: 30 ug/kg/min via INTRAVENOUS

## 2015-06-26 MED ORDER — LIDOCAINE HCL (CARDIAC) 20 MG/ML IV SOLN
INTRAVENOUS | Status: DC | PRN
  Administered 2015-06-26: 40 mg via INTRATRACHEAL

## 2015-06-26 MED ORDER — MAGNESIUM CITRATE PO SOLN: 1.0000 | Freq: Once | ORAL | Status: DC | PRN

## 2015-06-26 MED ORDER — CEFAZOLIN SODIUM-DEXTROSE 2-4 GM/100ML-% IV SOLN
2.0000 g | Freq: Four times a day (QID) | INTRAVENOUS | Status: AC
  Administered 2015-06-26 – 2015-06-27 (×2): 2 g via INTRAVENOUS
  Filled 2015-06-26 (×2): qty 100

## 2015-06-26 MED ORDER — LACTATED RINGERS IV SOLN
INTRAVENOUS | Status: DC
  Administered 2015-06-26 (×2): via INTRAVENOUS

## 2015-06-26 MED ORDER — OXYCODONE HCL 5 MG PO TABS
5.0000 mg | ORAL_TABLET | ORAL | Status: DC | PRN
  Administered 2015-06-26: 5 mg via ORAL
  Administered 2015-06-27 – 2015-06-29 (×4): 10 mg via ORAL
  Filled 2015-06-26 (×4): qty 2

## 2015-06-26 MED ORDER — DESIPRAMINE HCL 25 MG PO TABS
25.0000 mg | ORAL_TABLET | Freq: Every day | ORAL | Status: DC
  Administered 2015-06-27 – 2015-06-30 (×4): 25 mg via ORAL
  Filled 2015-06-26 (×4): qty 1

## 2015-06-26 MED ORDER — BENAZEPRIL HCL 20 MG PO TABS
20.0000 mg | ORAL_TABLET | Freq: Every day | ORAL | Status: DC
  Administered 2015-06-27 – 2015-06-30 (×4): 20 mg via ORAL
  Filled 2015-06-26 (×4): qty 1

## 2015-06-26 MED ORDER — EPHEDRINE SULFATE 50 MG/ML IJ SOLN
INTRAMUSCULAR | Status: DC | PRN
  Administered 2015-06-26 (×5): 10 mg via INTRAVENOUS

## 2015-06-26 MED ORDER — PROPOFOL 10 MG/ML IV BOLUS
INTRAVENOUS | Status: AC
  Filled 2015-06-26: qty 20

## 2015-06-26 MED ORDER — INSULIN ASPART 100 UNIT/ML ~~LOC~~ SOLN: 0.0000 [IU] | Freq: Every day | SUBCUTANEOUS | Status: DC

## 2015-06-26 MED ORDER — ASPIRIN EC 325 MG PO TBEC
325.0000 mg | DELAYED_RELEASE_TABLET | Freq: Two times a day (BID) | ORAL | Status: DC
  Administered 2015-06-26 – 2015-06-30 (×8): 325 mg via ORAL
  Filled 2015-06-26 (×8): qty 1

## 2015-06-26 MED ORDER — AMLODIPINE BESYLATE 5 MG PO TABS
5.0000 mg | ORAL_TABLET | Freq: Every day | ORAL | Status: DC
  Administered 2015-06-28 – 2015-06-30 (×3): 5 mg via ORAL
  Filled 2015-06-26 (×4): qty 1

## 2015-06-26 MED ORDER — FENTANYL CITRATE (PF) 250 MCG/5ML IJ SOLN
INTRAMUSCULAR | Status: AC
  Filled 2015-06-26: qty 5

## 2015-06-26 MED ORDER — TRANEXAMIC ACID 1000 MG/10ML IV SOLN
1000.0000 mg | Freq: Once | INTRAVENOUS | Status: AC
  Administered 2015-06-26: 1000 mg via INTRAVENOUS
  Filled 2015-06-26: qty 10

## 2015-06-26 MED ORDER — GLUCOSAMINE-CHONDROITIN 500-400 MG PO TABS: 1.0000 | ORAL_TABLET | ORAL | Status: DC

## 2015-06-26 MED ORDER — PHENYLEPHRINE HCL 10 MG/ML IJ SOLN
INTRAMUSCULAR | Status: DC | PRN
  Administered 2015-06-26 (×5): 80 ug via INTRAVENOUS

## 2015-06-26 MED ORDER — BACLOFEN 10 MG PO TABS: 10.0000 mg | ORAL_TABLET | Freq: Three times a day (TID) | ORAL | Status: DC | PRN

## 2015-06-26 MED ORDER — ACETAMINOPHEN 650 MG RE SUPP: 650.0000 mg | Freq: Four times a day (QID) | RECTAL | Status: DC | PRN

## 2015-06-26 MED ORDER — POLYETHYLENE GLYCOL 3350 17 G PO PACK
17.0000 g | PACK | Freq: Every day | ORAL | Status: DC | PRN
  Administered 2015-06-29: 17 g via ORAL
  Filled 2015-06-26: qty 1

## 2015-06-26 MED ORDER — DIPHENHYDRAMINE HCL 12.5 MG/5ML PO ELIX: 25.0000 mg | ORAL_SOLUTION | ORAL | Status: DC | PRN

## 2015-06-26 MED ORDER — ONDANSETRON HCL 4 MG PO TABS: 4.0000 mg | ORAL_TABLET | Freq: Four times a day (QID) | ORAL | Status: DC | PRN

## 2015-06-26 MED ORDER — DEXAMETHASONE SODIUM PHOSPHATE 10 MG/ML IJ SOLN
10.0000 mg | Freq: Once | INTRAMUSCULAR | Status: AC
  Administered 2015-06-27: 10 mg via INTRAVENOUS
  Filled 2015-06-26: qty 1

## 2015-06-26 MED ORDER — DEXTROSE 5 % IV SOLN
10.0000 mg | INTRAVENOUS | Status: DC | PRN
  Administered 2015-06-26: 10 ug/min via INTRAVENOUS

## 2015-06-26 MED ORDER — PHENOL 1.4 % MT LIQD: 1.0000 | OROMUCOSAL | Status: DC | PRN

## 2015-06-26 MED ORDER — CYANOCOBALAMIN 500 MCG PO TABS
1000.0000 ug | ORAL_TABLET | Freq: Every day | ORAL | Status: DC
  Administered 2015-06-26 – 2015-06-30 (×5): 1000 ug via ORAL
  Filled 2015-06-26 (×5): qty 2

## 2015-06-26 MED ORDER — PANTOPRAZOLE SODIUM 40 MG PO TBEC
40.0000 mg | DELAYED_RELEASE_TABLET | Freq: Every day | ORAL | Status: DC
  Administered 2015-06-27 – 2015-06-30 (×4): 40 mg via ORAL
  Filled 2015-06-26 (×4): qty 1

## 2015-06-26 MED ORDER — METOCLOPRAMIDE HCL 5 MG PO TABS: 5.0000 mg | ORAL_TABLET | Freq: Three times a day (TID) | ORAL | Status: DC | PRN

## 2015-06-26 MED ORDER — MENTHOL 3 MG MT LOZG: 1.0000 | LOZENGE | OROMUCOSAL | Status: DC | PRN

## 2015-06-26 MED ORDER — OXYCODONE HCL 5 MG PO TABS
ORAL_TABLET | ORAL | Status: AC
  Filled 2015-06-26: qty 1

## 2015-06-26 MED ORDER — DEXTROSE 5 % IV SOLN
500.0000 mg | Freq: Four times a day (QID) | INTRAVENOUS | Status: DC | PRN
  Filled 2015-06-26: qty 5

## 2015-06-26 MED ORDER — HYDROMORPHONE HCL 1 MG/ML IJ SOLN
0.2500 mg | INTRAMUSCULAR | Status: DC | PRN
  Administered 2015-06-26 (×2): 0.5 mg via INTRAVENOUS

## 2015-06-26 MED ORDER — SORBITOL 70 % SOLN: 30.0000 mL | Freq: Every day | Status: DC | PRN

## 2015-06-26 MED ORDER — PROPOFOL 500 MG/50ML IV EMUL
INTRAVENOUS | Status: AC
  Filled 2015-06-26: qty 50

## 2015-06-26 MED ORDER — ALUM & MAG HYDROXIDE-SIMETH 200-200-20 MG/5ML PO SUSP
30.0000 mL | ORAL | Status: DC | PRN
Start: 2015-06-26 — End: 2015-06-30

## 2015-06-26 MED ORDER — VITAMIN D 1000 UNITS PO TABS
1000.0000 [IU] | ORAL_TABLET | Freq: Every day | ORAL | Status: DC
  Administered 2015-06-26 – 2015-06-30 (×5): 1000 [IU] via ORAL
  Filled 2015-06-26 (×5): qty 1

## 2015-06-26 MED ORDER — SODIUM CHLORIDE 0.9 % IV SOLN: INTRAVENOUS | Status: DC

## 2015-06-26 MED ORDER — TRANEXAMIC ACID 1000 MG/10ML IV SOLN
1000.0000 mg | INTRAVENOUS | Status: AC
  Administered 2015-06-26: 1000 mg via INTRAVENOUS
  Filled 2015-06-26: qty 10

## 2015-06-26 MED ORDER — KETOROLAC TROMETHAMINE 30 MG/ML IJ SOLN
INTRAMUSCULAR | Status: AC
  Filled 2015-06-26: qty 1

## 2015-06-26 MED ORDER — METOPROLOL TARTRATE 25 MG PO TABS
25.0000 mg | ORAL_TABLET | Freq: Two times a day (BID) | ORAL | Status: DC
  Administered 2015-06-26 – 2015-06-30 (×5): 25 mg via ORAL
  Filled 2015-06-26 (×7): qty 1

## 2015-06-26 MED ORDER — ONDANSETRON HCL 4 MG/2ML IJ SOLN
4.0000 mg | Freq: Four times a day (QID) | INTRAMUSCULAR | Status: DC | PRN
  Administered 2015-06-27: 4 mg via INTRAVENOUS
  Filled 2015-06-26: qty 2

## 2015-06-26 MED ORDER — ONDANSETRON HCL 4 MG PO TABS: 4.0000 mg | ORAL_TABLET | Freq: Three times a day (TID) | ORAL | Status: DC | PRN

## 2015-06-26 MED ORDER — KETOROLAC TROMETHAMINE 30 MG/ML IJ SOLN
30.0000 mg | Freq: Four times a day (QID) | INTRAMUSCULAR | Status: AC | PRN
  Administered 2015-06-26: 30 mg via INTRAVENOUS
  Filled 2015-06-26 (×2): qty 1

## 2015-06-26 MED ORDER — SENNOSIDES-DOCUSATE SODIUM 8.6-50 MG PO TABS: 1.0000 | ORAL_TABLET | Freq: Every evening | ORAL | Status: DC | PRN

## 2015-06-26 MED ORDER — CEFAZOLIN SODIUM-DEXTROSE 2-4 GM/100ML-% IV SOLN
2.0000 g | INTRAVENOUS | Status: AC
  Administered 2015-06-26: 2 g via INTRAVENOUS
  Filled 2015-06-26: qty 100

## 2015-06-26 MED ORDER — INSULIN ASPART 100 UNIT/ML ~~LOC~~ SOLN
4.0000 [IU] | Freq: Three times a day (TID) | SUBCUTANEOUS | Status: DC
Start: 2015-06-27 — End: 2015-06-30
  Administered 2015-06-27 – 2015-06-30 (×9): 4 [IU] via SUBCUTANEOUS

## 2015-06-26 MED ORDER — OXYCODONE HCL 5 MG PO TABS: 5.0000 mg | ORAL_TABLET | ORAL | Status: DC | PRN

## 2015-06-26 MED ORDER — ATORVASTATIN CALCIUM 40 MG PO TABS
40.0000 mg | ORAL_TABLET | Freq: Every day | ORAL | Status: DC
  Administered 2015-06-27 – 2015-06-29 (×3): 40 mg via ORAL
  Filled 2015-06-26 (×3): qty 1

## 2015-06-26 MED ORDER — ACETAMINOPHEN 325 MG PO TABS
650.0000 mg | ORAL_TABLET | Freq: Four times a day (QID) | ORAL | Status: DC | PRN
  Administered 2015-06-28: 650 mg via ORAL
  Filled 2015-06-26: qty 2

## 2015-06-26 MED ORDER — PROPOFOL 1000 MG/100ML IV EMUL
INTRAVENOUS | Status: AC
  Filled 2015-06-26: qty 100

## 2015-06-26 MED ORDER — LINACLOTIDE 145 MCG PO CAPS
145.0000 ug | ORAL_CAPSULE | Freq: Every day | ORAL | Status: DC
  Administered 2015-06-27 – 2015-06-30 (×4): 145 ug via ORAL
  Filled 2015-06-26 (×4): qty 1

## 2015-06-26 MED ORDER — 0.9 % SODIUM CHLORIDE (POUR BTL) OPTIME
TOPICAL | Status: DC | PRN
  Administered 2015-06-26: 1000 mL

## 2015-06-26 MED ORDER — METHOCARBAMOL 500 MG PO TABS
ORAL_TABLET | ORAL | Status: AC
  Filled 2015-06-26: qty 1

## 2015-06-26 MED ORDER — CALCIUM CARBONATE-VITAMIN D 500-200 MG-UNIT PO TABS
1.0000 | ORAL_TABLET | Freq: Every day | ORAL | Status: DC
  Administered 2015-06-27 – 2015-06-30 (×4): 1 via ORAL
  Filled 2015-06-26 (×5): qty 1

## 2015-06-26 MED ORDER — SODIUM CHLORIDE 0.9 % IR SOLN
Status: DC | PRN
  Administered 2015-06-26: 1000 mL

## 2015-06-26 MED ORDER — OXYCODONE HCL ER 10 MG PO T12A: 10.0000 mg | EXTENDED_RELEASE_TABLET | Freq: Two times a day (BID) | ORAL | Status: DC

## 2015-06-26 MED ORDER — PROPOFOL 10 MG/ML IV BOLUS
INTRAVENOUS | Status: DC | PRN
  Administered 2015-06-26: 20 mg via INTRAVENOUS

## 2015-06-26 MED ORDER — TRANEXAMIC ACID 1000 MG/10ML IV SOLN
2000.0000 mg | Freq: Once | INTRAVENOUS | Status: DC
  Filled 2015-06-26: qty 20

## 2015-06-26 SURGICAL SUPPLY — 43 items
CAPT HIP TOTAL 2 ×2 IMPLANT
CELLS DAT CNTRL 66122 CELL SVR (MISCELLANEOUS) ×1 IMPLANT
CLSR STERI-STRIP ANTIMIC 1/2X4 (GAUZE/BANDAGES/DRESSINGS) ×4 IMPLANT
COVER SURGICAL LIGHT HANDLE (MISCELLANEOUS) ×2 IMPLANT
DRAPE C-ARM 42X72 X-RAY (DRAPES) ×2 IMPLANT
DRAPE IMP U-DRAPE 54X76 (DRAPES) ×2 IMPLANT
DRAPE STERI IOBAN 125X83 (DRAPES) ×2 IMPLANT
DRAPE U-SHAPE 47X51 STRL (DRAPES) ×6 IMPLANT
DRSG AQUACEL AG ADV 3.5X10 (GAUZE/BANDAGES/DRESSINGS) ×2 IMPLANT
DRSG MEPILEX BORDER 4X12 (GAUZE/BANDAGES/DRESSINGS) ×2 IMPLANT
ELECT BLADE 4.0 EZ CLEAN MEGAD (MISCELLANEOUS) ×2
ELECT REM PT RETURN 9FT ADLT (ELECTROSURGICAL) ×2
ELECTRODE BLDE 4.0 EZ CLN MEGD (MISCELLANEOUS) ×1 IMPLANT
ELECTRODE REM PT RTRN 9FT ADLT (ELECTROSURGICAL) ×1 IMPLANT
GAUZE XEROFORM 1X8 LF (GAUZE/BANDAGES/DRESSINGS) ×2 IMPLANT
GLOVE SKINSENSE NS SZ7.5 (GLOVE) ×1
GLOVE SKINSENSE STRL SZ7.5 (GLOVE) ×1 IMPLANT
GLOVE SURG SYN 7.5  E (GLOVE) ×1
GLOVE SURG SYN 7.5 E (GLOVE) ×1 IMPLANT
GOWN SRG XL XLNG 56XLVL 4 (GOWN DISPOSABLE) ×1 IMPLANT
GOWN STRL NON-REIN XL XLG LVL4 (GOWN DISPOSABLE) ×1
GOWN STRL REUS W/ TWL LRG LVL3 (GOWN DISPOSABLE) IMPLANT
GOWN STRL REUS W/TWL LRG LVL3 (GOWN DISPOSABLE)
HANDPIECE INTERPULSE COAX TIP (DISPOSABLE) ×1
IV NS 1000ML (IV SOLUTION) ×1
IV NS 1000ML BAXH (IV SOLUTION) ×1 IMPLANT
IV NS IRRIG 3000ML ARTHROMATIC (IV SOLUTION) ×2 IMPLANT
KIT BASIN OR (CUSTOM PROCEDURE TRAY) ×2 IMPLANT
MARKER SKIN DUAL TIP RULER LAB (MISCELLANEOUS) ×2 IMPLANT
PACK TOTAL JOINT (CUSTOM PROCEDURE TRAY) ×2 IMPLANT
RTRCTR WOUND ALEXIS 18CM MED (MISCELLANEOUS) ×2
SAW OSC TIP CART 19.5X105X1.3 (SAW) ×2 IMPLANT
SEALER BIPOLAR AQUA 6.0 (INSTRUMENTS) IMPLANT
SET HNDPC FAN SPRY TIP SCT (DISPOSABLE) ×1 IMPLANT
SUT ETHIBOND 2 V 37 (SUTURE) ×2 IMPLANT
SUT ETHIBOND NAB CT1 #1 30IN (SUTURE) ×6 IMPLANT
SUT VIC AB 1 CT1 27 (SUTURE) ×1
SUT VIC AB 1 CT1 27XBRD ANBCTR (SUTURE) ×1 IMPLANT
SUT VIC AB 2-0 CT1 27 (SUTURE) ×2
SUT VIC AB 2-0 CT1 TAPERPNT 27 (SUTURE) ×2 IMPLANT
SUT VICRYL 0 CT 1 36IN (SUTURE) ×2 IMPLANT
TOWEL OR 17X26 10 PK STRL BLUE (TOWEL DISPOSABLE) ×2 IMPLANT
TRAY FOLEY CATH 16FR SILVER (SET/KITS/TRAYS/PACK) ×2 IMPLANT

## 2015-06-26 NOTE — Op Note (Signed)
RIGHT TOTAL HIP ARTHROPLASTY ANTERIOR APPROACH  Procedure Note Melinda [name]   [id number]  Pre-op Diagnosis: right hip degenerative joint disease     Post-op Diagnosis: same   Operative Procedures  1. Total hip replacement; Right hip; uncemented [id number]   Personnel  Surgeon(s): Leandrew Koyanagi, MD   Anesthesia: spinal  Prosthesis: Depuy Acetabulum: Pinnacle 50 mm Femur: Corail KLA 11 Head: 32 mm size: +1.5 Liner: neutral Bearing Type: ceramic on poly  Date of Service: 06/26/2015  Total Hip Arthroplasty (Anterior Approach) Op Note:  After informed consent was obtained and the operative extremity marked in the holding area, the patient was brought back to the operating room and placed supine on the HANA table. Next, the operative extremity was prepped and draped in normal sterile fashion. Surgical timeout occurred verifying patient identification, surgical site, surgical procedure and administration of antibiotics.  A modified anterior Smith-Peterson approach to the hip was performed, using the interval between tensor fascia lata and sartorius.  Dissection was carried bluntly down onto the anterior hip capsule. The lateral femoral circumflex vessels were identified and coagulated. A capsulotomy was performed and the capsular flaps tagged for later repair.  Fluoroscopy was utilized to prepare for the femoral neck cut. The neck osteotomy was performed. The femoral head was removed, the acetabular rim was cleared of soft tissue and attention was turned to reaming the acetabulum.  Sequential reaming was performed under fluoroscopic guidance. We reamed to a size 50 mm, and then impacted the acetabular shell. The liner was then placed after irrigation and attention turned to the femur.  After placing the femoral hook, the leg was taken to externally rotated, extended and adducted position taking care to perform soft tissue releases to allow for adequate mobilization of the femur. Soft  tissue was cleared from the shoulder of the greater trochanter and the hook elevator used to improve exposure of the proximal femur. Sequential broaching performed up to a size 11. Trial neck and head were placed. The leg was brought back up to neutral and the construct reduced. The position and sizing of components, offset and leg lengths were checked using fluoroscopy. Stability of the construct was checked in extension and external rotation without any subluxation or impingement of prosthesis. We dislocated the prosthesis, dropped the leg back into position, removed trial components, and irrigated copiously. The final stem and head was then placed, the leg brought back up, the system reduced and fluoroscopy used to verify positioning.  We irrigated, obtained hemostasis and closed the capsule using #2 ethibond suture.  Dilute betadyne solution was used. The fascia was closed with #1 vicryl plus, the deep fat layer was closed with 0 vicryl, the subcutaneous layers closed with 2.0 Vicryl Plus and the skin closed with 4.0 monocryl. A sterile dressing was applied. The patient was awakened in the operating room and taken to recovery in stable condition.  All sponge, needle, and instrument counts were correct at the end of the case.   Position: supine  Complications: none.  Time Out: performed   Drains/Packing: none  Estimated blood loss: 200 cc  Returned to Recovery Room: in good condition.   Antibiotics: yes   Mechanical VTE (DVT) Prophylaxis: sequential compression devices, TED thigh-high  Chemical VTE (DVT) Prophylaxis: aspirin   Fluid Replacement: see anesthesia record  Specimens Removed: 1 to pathology   Sponge and Instrument Count Correct? yes   PACU: portable radiograph - low AP   Admission: inpatient status, start PT & OT POD#1  Plan/RTC: Return in 2 weeks for staple removal. Return in 6 weeks to see MD.  Weight Bearing/Load Lower Extremity: full  Hip precautions: none Suture  Removal: 10-14 days  Betadine to incision twice daily once dressing is removed on POD#7  N. Eduard Roux, MD [place] [phone] 3:11 PM      Implant Name Type Inv. Item Serial No. Manufacturer Lot No. LRB No. Used  pinnacle gription acetabular shell sector 69mm OD    [employer] [id number] Right 1  pinnacle altrax polyethylene acetabular liner +4 neutral 57mm 67mm      [id number] Right 1  Pinnacle cancellous bone screw 6.55mm x 63mm   1217-25-500  [id number] Right 1  corail hip system cementless femoral stem KLA size 11 highy offset collar     [employer] [id number] Right 1  biolox delta ceramic femoral head 33mm  diameter 12/14 taper     [employer] [id number] Right 1  Pinnacle altrx polyethylene acetabular liner neutral 74mm ID 47mm        [employer] [id number] Right 1

## 2015-06-26 NOTE — Anesthesia Procedure Notes (Signed)
Spinal Patient location during procedure: OR Start time: 06/26/2015 12:45 PM End time: 06/26/2015 12:53 PM Staffing Anesthesiologist: Gregery Walberg Performed by: anesthesiologist  Preanesthetic Checklist Completed: patient identified, site marked, surgical consent, pre-op evaluation, timeout performed, IV checked, risks and benefits discussed and monitors and equipment checked Spinal Block Patient position: sitting Prep: Betadine Patient monitoring: heart rate, cardiac monitor, continuous pulse ox and blood pressure Location: L3-4 Injection technique: single-shot Needle Needle type: Quincke  Needle gauge: 25 G Needle length: 9 cm Needle insertion depth: 5 cm Assessment Sensory level: T6 Additional Notes tolerataed well

## 2015-06-26 NOTE — Progress Notes (Signed)
To CT scan with PACU RN

## 2015-06-26 NOTE — Discharge Instructions (Signed)

## 2015-06-26 NOTE — Anesthesia Preprocedure Evaluation (Addendum)
Anesthesia Evaluation  Patient identified by MRN, date of birth, ID band Patient awake    Reviewed: Allergy & Precautions, NPO status , Patient's Chart, lab work & pertinent test results  History of Anesthesia Complications Negative for: history of anesthetic complications  Airway Mallampati: II  TM Distance: >3 FB Neck ROM: Full    Dental  (+) Teeth Intact   Pulmonary shortness of breath, sleep apnea ,    breath sounds clear to auscultation       Cardiovascular hypertension,  Rhythm:Regular Rate:Normal     Neuro/Psych negative neurological ROS     GI/Hepatic Neg liver ROS, GERD  ,  Endo/Other  diabetes  Renal/GU negative Renal ROS     Musculoskeletal  (+) Arthritis ,   Abdominal   Peds  Hematology   Anesthesia Other Findings   Reproductive/Obstetrics                           Anesthesia Physical Anesthesia Plan  ASA: II  Anesthesia Plan: Spinal   Post-op Pain Management:    Induction:   Airway Management Planned: Natural Airway  Additional Equipment:   Intra-op Plan:   Post-operative Plan:   Informed Consent: I have reviewed the patients History and Physical, chart, labs and discussed the procedure including the risks, benefits and alternatives for the proposed anesthesia with the patient or authorized representative who has indicated his/her understanding and acceptance.     Plan Discussed with: CRNA  Anesthesia Plan Comments:         Anesthesia Quick Evaluation

## 2015-06-26 NOTE — Anesthesia Postprocedure Evaluation (Signed)
Anesthesia Post Note  Patient: Melinda [name]  Procedure(s) Performed: Procedure(s) (LRB): RIGHT TOTAL HIP ARTHROPLASTY ANTERIOR APPROACH (Right)  Patient location during evaluation: PACU Anesthesia Type: Spinal and MAC Level of consciousness: awake and alert Pain management: pain level controlled Vital Signs Assessment: post-procedure vital signs reviewed and stable Respiratory status: spontaneous breathing and respiratory function stable Cardiovascular status: blood pressure returned to baseline and stable Postop Assessment: spinal receding Anesthetic complications: no    Last Vitals:  Filed Vitals:   06/26/15 1630 06/26/15 1645  BP: 112/59 108/63  Pulse: 51 54  Temp: 36.1 C   Resp: 16 16    Last Pain:  Filed Vitals:   06/26/15 1724  PainSc: 2                  Jonalyn Sedlak,W. EDMOND

## 2015-06-26 NOTE — Progress Notes (Signed)
Dr Erlinda Hong called-wants pt to CT of right hip on way to floor.

## 2015-06-26 NOTE — Transfer of Care (Signed)
Immediate Anesthesia Transfer of Care Note  Patient: Melinda [name]  Procedure(s) Performed: Procedure(s): RIGHT TOTAL HIP ARTHROPLASTY ANTERIOR APPROACH (Right)  Patient Location: PACU  Anesthesia Type:Spinal  Level of Consciousness: awake, alert  and oriented  Airway & Oxygen Therapy: Patient Spontanous Breathing  Post-op Assessment: Report given to RN  Post vital signs: Reviewed and stable  Last Vitals:  Filed Vitals:   06/26/15 1019  BP: 133/61  Pulse: 61  Temp: 36.6 C  Resp: 20    Complications: No apparent anesthesia complications

## 2015-06-26 NOTE — H&P (Signed)
PREOPERATIVE H&P  Chief Complaint: right hip degenerative joint disease  HPI: Melinda [name] is a 82 y.o. female who presents for surgical treatment of right hip degenerative joint disease.  She denies any changes in medical history.  Past Medical History  Diagnosis Date  . Hypercholesterolemia   . Acid reflux   . Obstructive sleep apnea   . Depression   . Insomnia   . Premature atrial contractions   . Anxiety   . Hypertension   . Cataract     surgery,bilateral  . Pyloric antral stenosis     in childhood,infancy  . Multifactorial gait disorder   . Sleep apnea with use of continuous positive airway pressure (CPAP) 10/19/2012  . Diabetes mellitus without complication ([place])   . Shortness of breath dyspnea     with exertion  . Hx: UTI (urinary tract infection)   . Arthritis   . Constipation   . Family history of adverse reaction to anesthesia     mother always had n/v   Past Surgical History  Procedure Laterality Date  . Abdominal hysterectomy  1970  . Transthoracic echocardiogram  07/23/2009    mild concentric LVH/mild mitral insufficiency/ moderate tricuspid innsufficiency/mild pulmonary HTN/EF- 55-60%  . Nm myoview ltd  11/30/2007    normal stress nuclear study/EF-83%  . Vaginal delivery    . Inner ear surgery Left     x 3  . Colonoscopy    . Laparoscopic salpingo oopherectomy    . Tonsillectomy    . Abdominal surgery      as a 29 day old baby  . Eye surgery Bilateral     cataract surgery with lens implant   Social History   Social History  . Marital Status: Married    Spouse Name: N/A  . Number of Children: 1  . Years of Education: 13   Occupational History  . Retired    Social History Main Topics  . Smoking status: Never Smoker   . Smokeless tobacco: Never Used  . Alcohol Use: No     Comment: rare  . Drug Use: No  . Sexual Activity: Not on file   Other Topics Concern  . Not on file   Social History Narrative   Patient lives alone.    Patient is widowed.    Patient retired.    Patient some college.    Patient has one child.    Caffeine 1-2 cups daily avg.   Family History  Problem Relation Age of Onset  . Heart attack Father   . Heart failure Mother   . Cancer Mother     colon,kidney  . Diabetes Sister   . Hypertension Sister   . Heart failure Sister    Allergies  Allergen Reactions  . Phenobarbital Itching   Prior to Admission medications   Medication Sig Start Date End Date Taking? Authorizing Provider  acetaminophen (TYLENOL) 650 MG CR tablet Take 650 mg by mouth every 8 (eight) hours as needed for pain.   Yes Historical Provider, MD  amLODipine (NORVASC) 5 MG tablet Take 5 mg by mouth daily.   Yes Historical Provider, MD  aspirin 81 MG tablet Take 81 mg by mouth daily.   Yes Historical Provider, MD  atorvastatin (LIPITOR) 40 MG tablet Take 40 mg by mouth daily. One tablet in am   Yes Historical Provider, MD  benazepril (LOTENSIN) 20 MG tablet Take 20 mg by mouth daily.   Yes Historical Provider, MD  Blood Glucose  Monitoring Suppl (ONE TOUCH ULTRA 2) W/DEVICE KIT  06/21/13  Yes Historical Provider, MD  Calcium Carbonate-Vitamin D3 (CALCIUM 600+D3) 600-400 MG-UNIT TABS Take 1 tablet by mouth daily.   Yes Historical Provider, MD  cholecalciferol (VITAMIN D) 1000 UNITS tablet Take 1,000 Units by mouth daily.   Yes Historical Provider, MD  desipramine (NORPRAMIN) 25 MG tablet Take 25 mg by mouth daily.   Yes Historical Provider, MD  glucosamine-chondroitin 500-400 MG tablet Take 1 tablet by mouth every other day.    Yes Historical Provider, MD  glucose blood (ACCU-CHEK ACTIVE STRIPS) test strip 1 each by Other route as needed for other. Use as instructed   Yes Historical Provider, MD  linaclotide (LINZESS) 145 MCG CAPS capsule Take 145 mcg by mouth daily before breakfast.    Yes Historical Provider, MD  metoprolol (LOPRESSOR) 50 MG tablet Take 25 mg by mouth 2 (two) times daily. Take 1/2 tablet by mouth twice daily    Yes Historical Provider, MD  Omega-3 Fatty Acids (FISH OIL) 1200 MG CAPS Take 1 capsule by mouth daily.    Yes Historical Provider, MD  omeprazole (PRILOSEC) 20 MG capsule Take 20 mg by mouth daily. One tablet daily in mornings   Yes Historical Provider, MD  [facility] DELICA LANCETS 21T [place]  06/21/13  Yes Historical Provider, MD  temazepam (RESTORIL) 7.5 MG capsule Take 1 capsule (7.5 mg total) by mouth daily. Take 1-2 as needed Patient taking differently: Take 7.5 mg by mouth at bedtime. Take 1-2 as needed 03/27/15  Yes Asencion Partridge Dohmeier, MD  vitamin B-12 (CYANOCOBALAMIN) 500 MCG tablet Take 1,000 mcg by mouth daily.    Yes Historical Provider, MD  Sennosides (EX-LAX) 15 MG CHEW Chew by mouth. Takes 2 chews as needed    Historical Provider, MD     Positive ROS: All other systems have been reviewed and were otherwise negative with the exception of those mentioned in the HPI and as above.  Physical Exam: General: Alert, no acute distress Cardiovascular: No pedal edema Respiratory: No cyanosis, no use of accessory musculature GI: abdomen soft Skin: No lesions in the area of chief complaint Neurologic: Sensation intact distally Psychiatric: Patient is competent for consent with normal mood and affect Lymphatic: no lymphedema  MUSCULOSKELETAL: exam stable  Assessment: right hip degenerative joint disease  Plan: Plan for Procedure(s): RIGHT TOTAL HIP ARTHROPLASTY ANTERIOR APPROACH  The risks benefits and alternatives were discussed with the patient including but not limited to the risks of nonoperative treatment, versus surgical intervention including infection, bleeding, nerve injury,  blood clots, cardiopulmonary complications, morbidity, mortality, among others, and they were willing to proceed.   Marianna Payment, MD   06/26/2015 7:30 AM

## 2015-06-27 ENCOUNTER — Encounter (HOSPITAL_COMMUNITY): Payer: Self-pay | Admitting: Orthopaedic Surgery

## 2015-06-27 LAB — BASIC METABOLIC PANEL
Anion gap: 9 (ref 5–15)
BUN: 14 mg/dL (ref 6–20)
BUN: 14 mg/dL (ref 4–21)
CALCIUM: 8.3 mg/dL — AB (ref 8.9–10.3)
CHLORIDE: 102 mmol/L (ref 101–111)
CO2: 24 mmol/L (ref 22–32)
CREATININE: 0.7 mg/dL (ref 0.5–1.1)
CREATININE: 0.7 mg/dL (ref 0.44–1.00)
GFR calc non Af Amer: >60 mL/min (ref >60)
GLUCOSE: 148 mg/dL — AB (ref 65–99)
Glucose: 148 mg/dL
Potassium: 4.1 mmol/L (ref 3.5–5.1)
SODIUM: 135 mmol/L — AB (ref 137–147)
Sodium: 135 mmol/L (ref 135–145)

## 2015-06-27 LAB — CBC
HEMATOCRIT: 33 % — AB (ref 36.0–46.0)
HEMOGLOBIN: 10.9 g/dL — AB (ref 12.0–15.0)
MCH: 30.3 pg (ref 26.0–34.0)
MCHC: 33 g/dL (ref 30.0–36.0)
MCV: 91.7 fL (ref 78.0–100.0)
Platelets: 131 10*3/uL — ABNORMAL LOW (ref 150–400)
RBC: 3.6 MIL/uL — ABNORMAL LOW (ref 3.87–5.11)
RDW: 12.6 % (ref 11.5–15.5)
WBC: 6.1 10*3/uL (ref 4.0–10.5)

## 2015-06-27 LAB — GLUCOSE, CAPILLARY
GLUCOSE-CAPILLARY: 175 mg/dL — AB (ref 65–99)
GLUCOSE-CAPILLARY: 135 mg/dL — AB (ref 65–99)
Glucose-Capillary: 149 mg/dL — ABNORMAL HIGH (ref 65–99)
Glucose-Capillary: 104 mg/dL — ABNORMAL HIGH (ref 65–99)

## 2015-06-27 LAB — CBC AND DIFFERENTIAL: WBC: 6.1 10*3/mL

## 2015-06-27 NOTE — Evaluation (Signed)
Physical Therapy Evaluation Patient Details Name: Melinda [name] MRN: [id number] DOB: 05-Sep-1931 Today's Date: 06/27/2015   History of Present Illness  82 yo admitted for right anterior THA. PMHx:depression, insomnia, HTN, DM, OSA, arthritis  Clinical Impression  Pt pleasant and willing to work with therapy. Pt able to mobilize with minimal cueing, but moved slowly throughout session due to nausea. RN notified and pt medicated at end of session. Pt has decreased strength, ROM, and functional mobility, and would benefit from therapy in order to maximize her independence and decrease caregiver burden. Follow up therapy and equipment recommendations listed below.    Follow Up Recommendations SNF;Supervision for mobility/OOB    Equipment Recommendations  3in1 (PT);Rolling walker with 5" wheels    Recommendations for Other Services OT consult     Precautions / Restrictions Precautions Precautions: Fall Restrictions RLE Weight Bearing: Weight bearing as tolerated      Mobility  Bed Mobility Overal bed mobility: Needs Assistance Bed Mobility: Supine to Sit     Supine to sit: Min guard     General bed mobility comments: cues for sequence, increased time with pt able to pivot to EOB and elevate trunk with rail   Transfers Overall transfer level: Needs assistance Equipment used: None Transfers: Sit to/from Stand Sit to Stand: Min guard         General transfer comment: increased time needed. cues for hand placement.  Ambulation/Gait Ambulation/Gait assistance: Min guard Ambulation Distance (Feet): 2 Feet Assistive device: Rolling walker (2 wheeled) Gait Pattern/deviations: Step-to pattern   Gait velocity interpretation: Below normal speed for age/gender General Gait Details: pt attempted to ambulate, but stopped due to nausea.  Stairs            Wheelchair Mobility    Modified Rankin (Stroke Patients Only)       Balance Overall balance assessment: No  apparent balance deficits (not formally assessed)                                           Pertinent Vitals/Pain Pain Assessment: 0-10 Pain Score: 7  Pain Location: right hip Pain Descriptors / Indicators: Aching Pain Intervention(s): Limited activity within patient's tolerance;Monitored during session;Repositioned;Ice applied    Home Living Family/patient expects to be discharged to:: Private residence Living Arrangements: Alone Available Help at Discharge: Family;Available PRN/intermittently Type of Home: House Home Access: Stairs to enter Entrance Stairs-Rails: None Entrance Stairs-Number of Steps: 2 Home Layout: Laundry or work area in basement;Two level;Able to live on main level with bedroom/bathroom Home Equipment: Gilford Rile - 2 wheels Additional Comments: sister next door    Prior Function Level of Independence: Independent               Hand Dominance        Extremity/Trunk Assessment   Upper Extremity Assessment: Generalized weakness           Lower Extremity Assessment: Generalized weakness;RLE deficits/detail RLE Deficits / Details: decreased ROM and strength post op    Cervical / Trunk Assessment: Normal  Communication   Communication: No difficulties  Cognition Arousal/Alertness: Awake/alert Behavior During Therapy: WFL for tasks assessed/performed Overall Cognitive Status: Within Functional Limits for tasks assessed                      General Comments      Exercises Total Joint Exercises Heel Slides: AROM;Right;5 reps;Supine  Hip ABduction/ADduction: AAROM;Right;Supine;5 reps      Assessment/Plan    PT Assessment Patient needs continued PT services  PT Diagnosis Difficulty walking;Acute pain;Generalized weakness   PT Problem List Decreased strength;Decreased range of motion;Decreased activity tolerance;Decreased mobility;Pain;Decreased knowledge of use of DME  PT Treatment Interventions DME  instruction;Gait training;Stair training;Functional mobility training;Therapeutic activities;Therapeutic exercise;Patient/family education   PT Goals (Current goals can be found in the Care Plan section) Acute Rehab PT Goals Patient Stated Goal: walk better and go to craft shows PT Goal Formulation: With patient Time For Goal Achievement: 07/04/15 Potential to Achieve Goals: Good    Frequency 7X/week   Barriers to discharge Decreased caregiver support      Co-evaluation               End of Session Equipment Utilized During Treatment: Gait belt Activity Tolerance: Patient tolerated treatment well Patient left: in chair;with call bell/phone within reach Nurse Communication: Mobility status         Time: 0723-0751 PT Time Calculation (min) (ACUTE ONLY): 28 min   Charges:   PT Evaluation $PT Eval Moderate Complexity: 1 Procedure PT Treatments $Therapeutic Activity: 8-22 mins   PT G CodesHaze Justin Jul 21, 2015, 8:04 AM   Haze Justin, SPT [phone]

## 2015-06-27 NOTE — Clinical Social Work Placement (Addendum)
   CLINICAL SOCIAL WORK PLACEMENT  NOTE  Date:  06/27/2015  Patient Details  Name: Melinda [name] MRN: [id number] Date of Birth: 1931-03-10  Clinical Social Work is seeking post-discharge placement for this patient at the [place] level of care (*CSW will initial, date and re-position this form in  chart as items are completed):  Yes   Patient/family provided with [place] Work Department's list of facilities offering this level of care within the geographic area requested by the patient (or if unable, by the patient's family).  Yes   Patient/family informed of their freedom to choose among providers that offer the needed level of care, that participate in Medicare, Medicaid or managed care program needed by the patient, have an available bed and are willing to accept the patient.  Yes   Patient/family informed of [facility]'s ownership interest in [facility] and [facility], as well as of the fact that they are under no obligation to receive care at these facilities.  PASRR submitted to EDS on 06/27/15     PASRR number received on 06/27/15     Existing PASRR number confirmed on       FL2 transmitted to all facilities in geographic area requested by pt/family on 06/27/15     FL2 transmitted to all facilities within larger geographic area on       Patient informed that his/her managed care company has contracts with or will negotiate with certain facilities, including the following:         06-28-15   Patient/family informed of bed offers received. (Updated 06-30-15 Evette Cristal, MSW, [place])  Patient chooses bed at  [facility] (Updated 06-30-15 Evette Cristal, MSW, [facility])     Physician recommends and patient chooses bed at      Patient to be transferred to  [facility] on  06-20-15 (Updated 06-30-15 Evette Cristal, MSW, LCSWA).  Patient to be transferred to facility by  [facility] (Updated 06-30-15 Evette Cristal, MSW, [place])      Patient family notified on  06-30-15 of transfer. (Updated 06-30-15 Evette Cristal, MSW, [place])  Name of family member notified:   Patient to notify her sister (Updated 06-30-15 Evette Cristal, MSW, LCSWA)     PHYSICIAN Please sign FL2     Additional Comment:    _______________________________________________ Ross Ludwig, LCSWA 06/27/2015, 3:31 PM

## 2015-06-27 NOTE — Progress Notes (Signed)
Utilization review completed.  

## 2015-06-27 NOTE — Care Management Note (Signed)
Case Management Note  Patient Details  Name: Melinda [name] MRN: [id number] Date of Birth: 09-02-1931  Subjective/Objective:            S/p right total knee arthroplasty        Action/Plan: PT recommended SNF. Referral made to CSW. Will continue to follow.  Expected Discharge Date:                  Expected Discharge Plan:  Skilled Nursing Facility  In-House Referral:  Clinical Social Work  Discharge planning Services  CM Consult  Post Acute Care Choice:    Choice offered to:     DME Arranged:    DME Agency:     HH Arranged:    [place] Agency:     Status of Service:  In process, will continue to follow  Medicare Important Message Given:    Date Medicare IM Given:    Medicare IM give by:    Date Additional Medicare IM Given:    Additional Medicare Important Message give by:     If discussed at [place] of Stay Meetings, dates discussed:    Additional Comments:  Nila Nephew, RN 06/27/2015, 10:13 AM

## 2015-06-27 NOTE — Progress Notes (Signed)
   Subjective:  Patient reports pain as moderate.  C/o nausea.  Objective:   VITALS:   Filed Vitals:   06/26/15 1645 06/26/15 1929 06/27/15 0504 06/27/15 0729  BP: 108/63 120/50 105/50   Pulse: 54 55 58   Temp:  97.8 F (36.6 C) 98 F (36.7 C)   TempSrc:      Resp: 16 16 16    Height:      Weight:      SpO2: 93% 97% 96% 95%    Neurologically intact Neurovascular intact Sensation intact distally Intact pulses distally Dorsiflexion/Plantar flexion intact Incision: dressing C/D/I and no drainage No cellulitis present Compartment soft   Lab Results  Component Value Date   WBC 6.1 06/27/2015   HGB 10.9* 06/27/2015   HCT 33.0* 06/27/2015   MCV 91.7 06/27/2015   PLT 131* 06/27/2015     Assessment/Plan:  1 Day Post-Op   - Expected postop acute blood loss anemia - will monitor for symptoms - Up with PT/OT - probably will need SNF, patient lives alone - DVT ppx - SCDs, ambulation, aspirin - WBAT operative extremity - Pain control - foley out this morning - Discharge planning - likely needs SNF  Marianna Payment 06/27/2015, 7:39 AM [phone]

## 2015-06-27 NOTE — NC FL2 (Signed)
[place] LEVEL OF CARE SCREENING TOOL     IDENTIFICATION  Patient Name: Melinda [name] Birthdate: Nov 10, 1931 Sex: female Admission Date (Current Location): 06/26/2015  [facility] and [place] Number:  Herbalist and Address:  [facility] [place] [address] [place], [place] [address]      Provider Number: [id number]  Attending Physician Name and Address:  Leandrew Koyanagi, MD  Relative Name and Phone Number:  Bull,Lisa Daughter [phone] or [phone]    Current Level of Care: Hospital Recommended Level of Care: [place] Prior Approval Number:    Date Approved/Denied:   PASRR Number: [id number]  Discharge Plan: SNF    Current Diagnoses: Patient Active Problem List   Diagnosis Date Noted  . Osteoarthritis of right hip 06/26/2015  . Hip joint replacement status 06/26/2015  . OSA on CPAP 03/27/2015  . Ataxia 03/27/2015  . Falls 03/27/2015  . Insomnia, controlled 03/27/2015  . Insomnia with sleep apnea 03/22/2014  . Adjustment disorder with mixed anxiety and depressed mood 03/22/2014  . Sleep apnea with use of continuous positive airway pressure (CPAP) 10/19/2012  . Multifactorial gait disorder     Orientation RESPIRATION BLADDER Height & Weight     Self, Time, Situation, Place  Normal Continent Weight: 174 lb 1.6 oz (78.971 kg) Height:  5' (152.4 cm)  BEHAVIORAL SYMPTOMS/MOOD NEUROLOGICAL BOWEL NUTRITION STATUS      Continent    AMBULATORY STATUS COMMUNICATION OF NEEDS Skin   Limited Assist Verbally Surgical wounds                       Personal Care Assistance Level of Assistance  Bathing, Dressing Bathing Assistance: Limited assistance   Dressing Assistance: Limited assistance     Functional Limitations Info             SPECIAL CARE FACTORS FREQUENCY  PT (By licensed PT)     PT Frequency: 5x a week              Contractures      Additional Factors Info  Insulin  Sliding Scale       Insulin Sliding Scale Info: 3x a day       Current Medications (06/27/2015):  This is the current hospital active medication list Current Facility-Administered Medications  Medication Dose Route Frequency Provider Last Rate Last Dose  . 0.9 %  sodium chloride infusion   Intravenous Continuous Naiping Ephriam Jenkins, MD      . acetaminophen (TYLENOL) tablet 650 mg  650 mg Oral Q6H PRN Naiping Ephriam Jenkins, MD       Or  . acetaminophen (TYLENOL) suppository 650 mg  650 mg Rectal Q6H PRN Leandrew Koyanagi, MD      . acetaminophen (TYLENOL) tablet 1,000 mg  1,000 mg Oral Q6H Naiping Ephriam Jenkins, MD   1,000 mg at 06/27/15 1228  . alum & mag hydroxide-simeth (MAALOX/MYLANTA) 200-200-20 MG/5ML suspension 30 mL  30 mL Oral Q4H PRN Naiping Ephriam Jenkins, MD      . amLODipine (NORVASC) tablet 5 mg  5 mg Oral Daily Naiping Ephriam Jenkins, MD   5 mg at 06/27/15 1000  . aspirin EC tablet 325 mg  325 mg Oral BID Leandrew Koyanagi, MD   325 mg at 06/27/15 0917  . atorvastatin (LIPITOR) tablet 40 mg  40 mg Oral q1800 Leandrew Koyanagi, MD      . benazepril (LOTENSIN) tablet 20 mg  20 mg Oral Daily Leandrew Koyanagi, MD   20 mg at 06/27/15 0917  . calcium-vitamin D (OSCAL WITH D) 500-200 MG-UNIT per tablet 1 tablet  1 tablet Oral Daily Naiping Ephriam Jenkins, MD   1 tablet at 06/27/15 0916  . cholecalciferol (VITAMIN D) tablet 1,000 Units  1,000 Units Oral Daily Leandrew Koyanagi, MD   1,000 Units at 06/27/15 [phone]  . cyanocobalamin tablet 1,000 mcg  1,000 mcg Oral Daily Leandrew Koyanagi, MD   1,000 mcg at 06/27/15 0917  . desipramine (NORPRAMIN) tablet 25 mg  25 mg Oral Daily Naiping Ephriam Jenkins, MD   25 mg at 06/27/15 0917  . diphenhydrAMINE (BENADRYL) 12.5 MG/5ML elixir 25 mg  25 mg Oral Q4H PRN Naiping Ephriam Jenkins, MD      . insulin aspart (novoLOG) injection 0-15 Units  0-15 Units Subcutaneous TID WC Naiping Ephriam Jenkins, MD   3 Units at 06/27/15 1228  . insulin aspart (novoLOG) injection 0-5 Units  0-5 Units Subcutaneous QHS Leandrew Koyanagi, MD   0 Units at 06/26/15 2200  . insulin aspart  (novoLOG) injection 4 Units  4 Units Subcutaneous TID WC Naiping Ephriam Jenkins, MD   4 Units at 06/27/15 1229  . ketorolac (TORADOL) 30 MG/ML injection 30 mg  30 mg Intravenous Q6H PRN Leandrew Koyanagi, MD   30 mg at 06/26/15 1553  . lactated ringers infusion   Intravenous Continuous Leandrew Koyanagi, MD 10 mL/hr at 06/26/15 1028    . linaclotide (LINZESS) capsule 145 mcg  145 mcg Oral QAC breakfast Leandrew Koyanagi, MD   145 mcg at 06/27/15 0917  . magnesium citrate solution 1 Bottle  1 Bottle Oral Once PRN Naiping Ephriam Jenkins, MD      . menthol-cetylpyridinium (CEPACOL) lozenge 3 mg  1 lozenge Oral PRN Naiping Ephriam Jenkins, MD       Or  . phenol (CHLORASEPTIC) mouth spray 1 spray  1 spray Mouth/Throat PRN Naiping Ephriam Jenkins, MD      . methocarbamol (ROBAXIN) tablet 500 mg  500 mg Oral Q6H PRN Leandrew Koyanagi, MD   500 mg at 06/26/15 1555   Or  . methocarbamol (ROBAXIN) 500 mg in dextrose 5 % 50 mL IVPB  500 mg Intravenous Q6H PRN Naiping Ephriam Jenkins, MD      . metoCLOPramide (REGLAN) tablet 5-10 mg  5-10 mg Oral Q8H PRN Naiping Ephriam Jenkins, MD       Or  . metoCLOPramide (REGLAN) injection 5-10 mg  5-10 mg Intravenous Q8H PRN Naiping Ephriam Jenkins, MD      . metoprolol tartrate (LOPRESSOR) tablet 25 mg  25 mg Oral BID Leandrew Koyanagi, MD   25 mg at 06/27/15 0917  . morphine 2 MG/ML injection 1 mg  1 mg Intravenous Q2H PRN Naiping Ephriam Jenkins, MD      . ondansetron [facility]) tablet 4 mg  4 mg Oral Q6H PRN Naiping Ephriam Jenkins, MD       Or  . ondansetron [facility]) injection 4 mg  4 mg Intravenous Q6H PRN Leandrew Koyanagi, MD   4 mg at 06/27/15 0747  . oxyCODONE (Oxy IR/ROXICODONE) immediate release tablet 5-15 mg  5-15 mg Oral Q3H PRN Leandrew Koyanagi, MD   10 mg at 06/27/15 [id number]  . oxyCODONE (OXYCONTIN) 12 hr tablet 10 mg  10 mg Oral Q12H Naiping Ephriam Jenkins, MD   10 mg at 06/27/15 0917  . pantoprazole (PROTONIX) EC tablet 40 mg  40 mg Oral Daily Leandrew Koyanagi, MD   40 mg at 06/27/15 0917  . polyethylene glycol (MIRALAX / GLYCOLAX) packet 17 g  17 g Oral Daily PRN Naiping Ephriam Jenkins, MD      .  sorbitol 70 % solution 30 mL  30 mL Oral Daily PRN Naiping Ephriam Jenkins, MD      . temazepam (RESTORIL) capsule 7.5 mg  7.5 mg Oral QHS Naiping Ephriam Jenkins, MD   7.5 mg at 06/26/15 2152  . tranexamic acid (CYKLOKAPRON) 2,000 mg in sodium chloride 0.9 % 50 mL Topical Application  123XX123 mg Topical Once Naiping Ephriam Jenkins, MD         Discharge Medications: Please see discharge summary for a list of discharge medications.  Relevant Imaging Results:  Relevant Lab Results:   Additional Information SSN [id number]  Ross Ludwig, [place]

## 2015-06-27 NOTE — Clinical Social Work Note (Signed)
Clinical Social Work Assessment  Patient Details  Name: Melinda [name] MRN: [id number] Date of Birth: January 22, 1932  Date of referral:  06/27/15               Reason for consult:  Facility Placement                Permission sought to share information with:  Facility Sport and exercise psychologist, Family Supports Permission granted to share information::  Yes, Verbal Permission Granted  Name::     Bull,Lisa Daughter [phone] or [phone]  Agency::  SNF admissions  Relationship::     Contact Information:     Housing/Transportation Living arrangements for the past 2 months:  Single Family Home Source of Information:  Patient Patient Interpreter Needed:  None Criminal Activity/Legal Involvement Pertinent to Current Situation/Hospitalization:  No - Comment as needed Significant Relationships:  Adult Children, Other Family Members Lives with:  Self Do you feel safe going back to the place where you live?  No (Patient needs short term rehab before she can go home.) Need for family participation in patient care:  No (Coment)  Care giving concerns: Patient feels she needs short term rehab before she is able to return back home.   Social Worker assessment / plan:  Patient is a pleasant 82 year old female who is alert and oriented x4 and lives alone.  Patient states she has never been to SNF for short term rehab before.  CSW explained to patient role of CSW and process for completing a SNF bed search.  Patient was explained by CSW how insurance will pay for her stay and what to expect at SNF.  Patient's family was at bedside, and seem to be involved in her care.  Patient states she is looking forward to beginning her therapy at a SNF so she can return back home.  CSW explained to patient that due to her insurance there are less options for SNF compared to other insurance plans.  Patient stated she does not have any other questions, CSW left contact information with patient if she has any other  questions.  Patient to be faxed out to [facility].  Employment status:  Retired Nurse, adult PT Recommendations:  [place] / Referral to community resources:  [place]  Patient/Family's Response to care:  Patient and family in agreement with going to SNF for short term rehab.  Patient/Family's Understanding of and Emotional Response to Diagnosis, Current Treatment, and Prognosis:  Patient aware of current diagnosis and treatment plan.  Emotional Assessment Appearance:  Appears stated age Attitude/Demeanor/Rapport:    Affect (typically observed):  Appropriate, Calm, Pleasant Orientation:  Oriented to Self, Oriented to Place, Oriented to  Time, Oriented to Situation Alcohol / Substance use:  Not Applicable Psych involvement (Current and /or in the community):  No (Comment)  Discharge Needs  Concerns to be addressed:  Lack of Support Readmission within the last 30 days:  No Current discharge risk:  Lives alone Barriers to Discharge:  Ship broker, Continued Medical Work up   Anell Barr 06/27/2015, 3:26 PM

## 2015-06-27 NOTE — Progress Notes (Signed)
Physical Therapy Progress Note Pt remains willing to perform HEP and increase function but limited by dizziness and nausea and unable to progress gait at this time. Will continue to follow.    06/27/15 1150  PT Visit Information  Last PT Received On 06/27/15  Assistance Needed +1  History of Present Illness 82 yo admitted for right anterior THA. PMHx:depression, insomnia, HTN, DM, OSA, arthritis  Precautions  Precautions Fall  Restrictions  Weight Bearing Restrictions Yes  RLE Weight Bearing WBAT  Pain Assessment  Pain Score 7  Pain Location right hip  Pain Intervention(s) Limited activity within patient's tolerance;Monitored during session;Premedicated before session;Repositioned;Ice applied  Cognition  Arousal/Alertness Awake/alert  Behavior During Therapy WFL for tasks assessed/performed  Overall Cognitive Status Within Functional Limits for tasks assessed  Bed Mobility  Overal bed mobility Needs Assistance  Bed Mobility Supine to Sit  Supine to sit Supervision  General bed mobility comments increased time with pt with increased ability from AM session  Transfers  Overall transfer level Needs assistance  Sit to Stand Min guard  General transfer comment cues for hand placement and safety  Ambulation/Gait  Ambulation/Gait assistance Min guard  Ambulation Distance (Feet) 5 Feet  Assistive device Rolling walker (2 wheeled)  Gait Pattern/deviations Step-to pattern  General Gait Details pt able to progress slightly but remains limited by dizziness and nausea  Gait velocity interpretation Below normal speed for age/gender  Exercises  Exercises Total Joint  Total Joint Exercises  Heel Slides 10 reps;Right;Supine;AROM  Hip ABduction/ADduction Right;10 reps;AROM;Supine  PT - End of Session  Equipment Utilized During Treatment Gait belt  Activity Tolerance Patient tolerated treatment well  Patient left in chair;with call bell/phone within reach;with family/visitor present  Nurse  Communication Mobility status  PT - Assessment/Plan  Follow Up Recommendations SNF;Supervision for mobility/OOB  PT Goal Progression  Progress towards PT goals Not progressing toward goals - comment (limited by nausea and dizziness)  PT Time Calculation  PT Start Time (ACUTE ONLY) 1137  PT Stop Time (ACUTE ONLY) 1201  PT Time Calculation (min) (ACUTE ONLY) 24 min  PT General Charges  $$ ACUTE PT VISIT 1 Procedure  PT Treatments  $Therapeutic Exercise 8-22 mins  $Therapeutic Activity 8-22 mins  Elwyn Reach, PT [phone]

## 2015-06-28 LAB — GLUCOSE, CAPILLARY
GLUCOSE-CAPILLARY: 140 mg/dL — AB (ref 65–99)
GLUCOSE-CAPILLARY: 118 mg/dL — AB (ref 65–99)
Glucose-Capillary: 106 mg/dL — ABNORMAL HIGH (ref 65–99)
Glucose-Capillary: 131 mg/dL — ABNORMAL HIGH (ref 65–99)

## 2015-06-28 NOTE — Progress Notes (Signed)
Physical Therapy Progress Note Pt with increased fatigue 2nd session and unable to complete hall ambulation. Pt educated for HEP and continued progression. Pt returned to bed per her request. Will continue to follow.   06/28/15 1300  PT Visit Information  Last PT Received On 06/28/15  Assistance Needed +1  History of Present Illness 82 yo admitted for right anterior THA. PMHx:depression, insomnia, HTN, DM, OSA, arthritis  Precautions  Precautions Fall  Restrictions  Weight Bearing Restrictions Yes  RLE Weight Bearing WBAT  Pain Assessment  Pain Assessment 0-10  Pain Score 3  Pain Location right hip  Pain Descriptors / Indicators Sore  Pain Intervention(s) Limited activity within patient's tolerance;Monitored during session;Repositioned  Cognition  Arousal/Alertness Awake/alert  Behavior During Therapy WFL for tasks assessed/performed  Overall Cognitive Status Within Functional Limits for tasks assessed  Bed Mobility  Overal bed mobility Needs Assistance  Bed Mobility Sit to Supine  Supine to sit Supervision  General bed mobility comments (increased time needed. supervision for safety)  Transfers  Overall transfer level Needs assistance  Equipment used None  Transfers Sit to/from Stand  Sit to Stand Min guard  General transfer comment cues for hand placement  Ambulation/Gait  Ambulation/Gait assistance Min guard  Ambulation Distance (Feet) 20 Feet  Assistive device Rolling walker (2 wheeled)  Gait Pattern/deviations Step-to pattern  General Gait Details cues for sequence  Gait velocity interpretation Below normal speed for age/gender  Exercises  Exercises Total Joint  Total Joint Exercises  Marching in Standing AROM;Right;15 reps;Standing  Standing Hip Extension AROM;Right;10 reps;Standing  PT - End of Session  Equipment Utilized During Treatment Gait belt  Activity Tolerance Patient tolerated treatment well  Patient left in bed;with call bell/phone within reach  PT -  Assessment/Plan  PT Plan Current plan remains appropriate  Follow Up Recommendations SNF;Supervision for mobility/OOB  PT Goal Progression  Progress towards PT goals Progressing toward goals  PT Time Calculation  PT Start Time (ACUTE ONLY) 1238  PT Stop Time (ACUTE ONLY) 1259  PT Time Calculation (min) (ACUTE ONLY) 21 min  PT General Charges  $$ ACUTE PT VISIT 1 Procedure  PT Treatments  $Therapeutic Activity 8-22 mins  Elwyn Reach, [place]

## 2015-06-28 NOTE — Progress Notes (Signed)
Physical Therapy Treatment Patient Details Name: Melinda [name] MRN: [id number] DOB: 04-19-1931 Today's Date: 07-18-15    History of Present Illness 82 yo admitted for right anterior THA. PMHx:depression, insomnia, HTN, DM, OSA, arthritis    PT Comments    Pt with excellent progression today able to ambulate in hallway and not limited by dizziness. Pt educated for HEP, progression and continued improvement in mobility. Will continue to follow.   Follow Up Recommendations  SNF;Supervision for mobility/OOB     Equipment Recommendations       Recommendations for Other Services       Precautions / Restrictions Precautions Precautions: Fall Restrictions Weight Bearing Restrictions: Yes RLE Weight Bearing: Weight bearing as tolerated    Mobility  Bed Mobility               General bed mobility comments: in chair on arrival  Transfers Overall transfer level: Needs assistance   Transfers: Sit to/from Stand Sit to Stand: Min guard         General transfer comment: cues for hand placement and safety  Ambulation/Gait Ambulation/Gait assistance: Min guard Ambulation Distance (Feet): 75 Feet Assistive device: Rolling walker (2 wheeled) Gait Pattern/deviations: Step-to pattern;Decreased stride length;Trunk flexed   Gait velocity interpretation: Below normal speed for age/gender General Gait Details: cues for posture, position in RW and sequence   Stairs            Wheelchair Mobility    Modified Rankin (Stroke Patients Only)       Balance                                    Cognition Arousal/Alertness: Awake/alert Behavior During Therapy: WFL for tasks assessed/performed Overall Cognitive Status: Within Functional Limits for tasks assessed                      Exercises Total Joint Exercises Heel Slides: AROM;Right;15 reps;Seated Hip ABduction/ADduction: AROM;Right;15 reps;Seated Long Arc Quad: AROM;Seated;Right;15  reps    General Comments        Pertinent Vitals/Pain Pain Score: 5  Pain Location: right hip Pain Descriptors / Indicators: Sore Pain Intervention(s): Limited activity within patient's tolerance;Monitored during session;Repositioned;RN gave pain meds during session    Home Living                      Prior Function            PT Goals (current goals can now be found in the care plan section) Progress towards PT goals: Progressing toward goals    Frequency       PT Plan Current plan remains appropriate    Co-evaluation             End of Session Equipment Utilized During Treatment: Gait belt Activity Tolerance: Patient tolerated treatment well Patient left: in chair;with call bell/phone within reach;with family/visitor present     Time: 0921-0947 PT Time Calculation (min) (ACUTE ONLY): 26 min  Charges:  $Gait Training: 8-22 mins $Therapeutic Exercise: 8-22 mins                    G Codes:      Melford Aase 18-Jul-2015, 9:53 AM Elwyn Reach, [place]

## 2015-06-28 NOTE — Progress Notes (Signed)
Subjective: Patient stable doing well following hip replacement   Objective: Vital signs in last 24 hours: Temp:  [97.6 F (36.4 C)] 97.6 F (36.4 C) (04/22 0419) Pulse Rate:  [63-70] 70 (04/22 0419) Resp:  [18] 18 (04/22 0419) BP: (109-120)/(47-55) 109/47 mmHg (04/22 0419) SpO2:  [95 %-98 %] 95 % (04/22 0419)  Intake/Output from previous day: 04/21 0701 - 04/22 0700 In: 720 [P.O.:720] Out: 50 [Urine:50] Intake/Output this shift:    Exam:  Sensation intact distally Intact pulses distally  Labs:  Recent Labs  06/27/15 0439  HGB 10.9*    Recent Labs  06/27/15 0439  WBC 6.1  RBC 3.60*  HCT 33.0*  PLT 131*    Recent Labs  06/27/15 0439  NA 135  K 4.1  CL 102  CO2 24  BUN 14  CREATININE 0.70  GLUCOSE 148*  CALCIUM 8.3*   No results for input(s): LABPT, INR in the last 72 hours.  Assessment/Plan: Plan discharge to skilled nursing Monday   [facility] SCOTT 06/28/2015, 6:21 AM

## 2015-06-28 NOTE — Progress Notes (Signed)
OT Cancellation Note  Patient Details Name: Melinda [name] MRN: [id number] DOB: February 23, 1932   Cancelled Treatment:    Reason Eval/Treat Not Completed: Fatigue/lethargy limiting ability to participate (c/o fatigue this pm and requesting OT return tomorrow)  [place], OTR/L  [phone] 06/28/2015 06/28/2015, 4:36 PM

## 2015-06-29 LAB — GLUCOSE, CAPILLARY
GLUCOSE-CAPILLARY: 129 mg/dL — AB (ref 65–99)
GLUCOSE-CAPILLARY: 80 mg/dL (ref 65–99)
GLUCOSE-CAPILLARY: 155 mg/dL — AB (ref 65–99)
Glucose-Capillary: 113 mg/dL — ABNORMAL HIGH (ref 65–99)

## 2015-06-29 NOTE — Evaluation (Signed)
Occupational Therapy Evaluation Patient Details Name: Melinda [name] MRN: [id number] DOB: 13-Aug-1931 Today's Date: 06/29/2015    History of Present Illness 82 yo admitted for right anterior THA. PMHx:depression, insomnia, HTN, DM, OSA, arthritis   Clinical Impression   Pt reports she was independent with ADLs PTA. Currently pt is overall min guard for functional mobility and min-mod assist for ADLs. Began safety and ADL education with pt; she verbalized understanding. Pt planning to d/c to short term SNF for further rehab prior to return home; agree with SNF placement to maximize independence and safety with ADLs and functional mobility. All further OT needs can be met at the next venue of care; will defer OT to SNF. Please re-consult if needs change. Thank you for this referral.    Follow Up Recommendations  SNF;Supervision/Assistance - 24 hour    Equipment Recommendations  Other (comment) (TBD at next venue)    Recommendations for Other Services       Precautions / Restrictions Precautions Precautions: Fall Precaution Comments: Direct anterior hip-no precautions Restrictions Weight Bearing Restrictions: Yes RLE Weight Bearing: Weight bearing as tolerated      Mobility Bed Mobility               General bed mobility comments: Pt sitting EOB upon arrival.  Transfers Overall transfer level: Needs assistance Equipment used: Rolling walker (2 wheeled) Transfers: Sit to/from Stand Sit to Stand: Min guard         General transfer comment: Min guard for safety. VCs for hand placement and technique.    Balance Overall balance assessment: Needs assistance Sitting-balance support: Feet supported;No upper extremity supported Sitting balance-Leahy Scale: Good     Standing balance support: No upper extremity supported;During functional activity Standing balance-Leahy Scale: Fair Standing balance comment: Pt able to stand at sink and complete grooming activities  without UE support.                            ADL Overall ADL's : Needs assistance/impaired Eating/Feeding: Set up;Sitting   Grooming: Min guard;Standing;Oral care;Wash/dry hands   Upper Body Bathing: Set up;Supervision/ safety;Sitting   Lower Body Bathing: Minimal assistance;Sit to/from stand   Upper Body Dressing : Set up;Supervision/safety;Sitting   Lower Body Dressing: Moderate assistance;Sit to/from stand   Toilet Transfer: Min guard;Ambulation;Comfort height toilet;Grab bars;RW   Toileting- Clothing Manipulation and Hygiene: Minimal assistance;Sit to/from stand       Functional mobility during ADLs: Min guard;Rolling walker General ADL Comments: Educated pt on safety with RW, WBAT status with RLE.      Vision Vision Assessment?: No apparent visual deficits   Perception     Praxis      Pertinent Vitals/Pain Pain Assessment: 0-10 Pain Score: 5  Pain Location: R hip Pain Descriptors / Indicators: Sore Pain Intervention(s): Limited activity within patient's tolerance;Monitored during session;Repositioned;Ice applied     Hand Dominance     Extremity/Trunk Assessment Upper Extremity Assessment Upper Extremity Assessment: Generalized weakness   Lower Extremity Assessment Lower Extremity Assessment: Defer to PT evaluation   Cervical / Trunk Assessment Cervical / Trunk Assessment: Normal   Communication Communication Communication: No difficulties   Cognition Arousal/Alertness: Awake/alert Behavior During Therapy: WFL for tasks assessed/performed Overall Cognitive Status: Within Functional Limits for tasks assessed                     General Comments       Exercises  Shoulder Instructions      Home Living Family/patient expects to be discharged to:: Skilled nursing facility Living Arrangements: Alone                                      Prior Functioning/Environment Level of Independence: Independent              OT Diagnosis: Generalized weakness;Acute pain   OT Problem List:     OT Treatment/Interventions:      OT Goals(Current goals can be found in the care plan section) Acute Rehab OT Goals Patient Stated Goal: get back home to take care of her cats OT Goal Formulation: With patient  OT Frequency:     Barriers to D/C:            Co-evaluation              End of Session Equipment Utilized During Treatment: Gait belt;Rolling walker  Activity Tolerance: Patient tolerated treatment well Patient left: in chair;with call bell/phone within reach   Time: 4159-0172 OT Time Calculation (min): 25 min Charges:  OT General Charges $OT Visit: 1 Procedure OT Evaluation $OT Eval Moderate Complexity: 1 Procedure OT Treatments $Self Care/Home Management : 8-22 mins G-Codes:     Binnie Kand M.S., OTR/L Pager: [phone]  06/29/2015, 8:17 AM

## 2015-06-29 NOTE — Progress Notes (Signed)
Subjective: Pt stable - doing well with PT   Objective: Vital signs in last 24 hours: Temp:  [97.7 F (36.5 C)-98 F (36.7 C)] 98 F (36.7 C) (04/23 1431) Pulse Rate:  [58-81] 74 (04/23 1431) Resp:  [16] 16 (04/23 1431) BP: (116-118)/(44-56) 116/56 mmHg (04/23 1431) SpO2:  [92 %-94 %] 94 % (04/23 1431)  Intake/Output from previous day:   Intake/Output this shift:    Exam:  Dorsiflexion/Plantar flexion intact  Labs:  Recent Labs  06/27/15 0439  HGB 10.9*    Recent Labs  06/27/15 0439  WBC 6.1  RBC 3.60*  HCT 33.0*  PLT 131*    Recent Labs  06/27/15 0439  NA 135  K 4.1  CL 102  CO2 24  BUN 14  CREATININE 0.70  GLUCOSE 148*  CALCIUM 8.3*   No results for input(s): LABPT, INR in the last 72 hours.  Assessment/Plan: Plan snf tomorrow if bed available   Authur Cubit SCOTT 06/29/2015, 7:38 PM

## 2015-06-29 NOTE — Progress Notes (Signed)
Physical Therapy Treatment Patient Details Name: Melinda [name] MRN: [id number] DOB: January 04, 1932 Today's Date: 06/29/2015    History of Present Illness 82 yo admitted for right anterior THA. PMHx:depression, insomnia, HTN, DM, OSA, arthritis    PT Comments    Pt making gradual progress with PT regarding mobility. Based upon the patient's current mobility level, continue to recommend SNF when medically stable. PT to continue to follow acutely to advance the patient's overall mobility and safety.   Follow Up Recommendations  SNF;Supervision for mobility/OOB     Equipment Recommendations  None recommended by PT    Recommendations for Other Services       Precautions / Restrictions Precautions Precautions: Fall Precaution Comments: Direct anterior hip-no precautions Restrictions Weight Bearing Restrictions: Yes RLE Weight Bearing: Weight bearing as tolerated    Mobility  Bed Mobility Overal bed mobility: Needs Assistance Bed Mobility: Sit to Supine       Sit to supine: Min assist   General bed mobility comments: Assist needed to lift Rt LE into bed.   Transfers Overall transfer level: Needs assistance Equipment used: Rolling walker (2 wheeled) Transfers: Sit to/from Stand Sit to Stand: Min guard         General transfer comment: no cues needed for hand positon  Ambulation/Gait Ambulation/Gait assistance: Min guard Ambulation Distance (Feet): 75 Feet Assistive device: Rolling walker (2 wheeled) Gait Pattern/deviations: Step-to pattern Gait velocity: decreased   General Gait Details: encouraging gradual progression into step-through pattern, pt making some progress toward end of session.    Stairs            Wheelchair Mobility    Modified Rankin (Stroke Patients Only)       Balance Overall balance assessment: Needs assistance Sitting-balance support: No upper extremity supported Sitting balance-Leahy Scale: Good     Standing balance  support: During functional activity Standing balance-Leahy Scale: Fair Standing balance comment: pt able to stand static briefly without UE support                    Cognition Arousal/Alertness: Awake/alert Behavior During Therapy: WFL for tasks assessed/performed Overall Cognitive Status: Within Functional Limits for tasks assessed                      Exercises Total Joint Exercises Ankle Circles/Pumps: AROM;Both;10 reps Quad Sets: Strengthening;Right;10 reps Heel Slides: AAROM;Right;10 reps Hip ABduction/ADduction: Strengthening;Right;10 reps (min assist) Long Arc Quad: Strengthening;Right;10 reps    General Comments        Pertinent Vitals/Pain Pain Assessment: 0-10 Pain Score: 2  Pain Location: Rt hip Pain Descriptors / Indicators: Sore Pain Intervention(s): Limited activity within patient's tolerance;Monitored during session    Home Living                      Prior Function            PT Goals (current goals can now be found in the care plan section) Acute Rehab PT Goals Patient Stated Goal: be able to eventually get back home PT Goal Formulation: With patient Time For Goal Achievement: 07/04/15 Potential to Achieve Goals: Good Progress towards PT goals: Progressing toward goals    Frequency  7X/week    PT Plan Current plan remains appropriate    Co-evaluation             End of Session Equipment Utilized During Treatment: Gait belt Activity Tolerance: Patient tolerated treatment well Patient left: in bed;with call  bell/phone within reach     Time: 1151-1218 PT Time Calculation (min) (ACUTE ONLY): 27 min  Charges:  $Gait Training: 8-22 mins $Therapeutic Exercise: 8-22 mins                    G Codes:      Cassell Clement, PT, CSCS Pager [phone] Office [phone]  06/29/2015, 3:40 PM

## 2015-06-30 ENCOUNTER — Inpatient Hospital Stay (HOSPITAL_COMMUNITY): Payer: Medicare HMO

## 2015-06-30 LAB — GLUCOSE, CAPILLARY
GLUCOSE-CAPILLARY: 126 mg/dL — AB (ref 65–99)
Glucose-Capillary: 125 mg/dL — ABNORMAL HIGH (ref 65–99)
Glucose-Capillary: 79 mg/dL (ref 65–99)

## 2015-06-30 NOTE — Plan of Care (Signed)
Problem: Pain Management: Goal: Pain level will decrease with appropriate interventions Outcome: Progressing Patient rated her pain as 3/10 and didn't need any pain medication. Slept well with no issues or complaints.

## 2015-06-30 NOTE — Progress Notes (Signed)
Report given to nurse Kathlee Nations at [facility]. All belongings packed and awaiting for transport. Patient denied any pain or distress. VSS for discharge.   Fabiano Ginley, RN.

## 2015-06-30 NOTE — Progress Notes (Signed)
   Subjective:  Patient reports pain as mild.  Ambulating in hall with PT this morning.  Objective:   VITALS:   Filed Vitals:   06/29/15 1024 06/29/15 1431 06/29/15 2127 06/30/15 0415  BP: 118/44 116/56 116/55 148/67  Pulse: 63 74 70 77  Temp:  98 F (36.7 C) 98.1 F (36.7 C) 98.7 F (37.1 C)  TempSrc:  Oral Oral Oral  Resp:  16 16 18   Height:      Weight:      SpO2:  94% 93% 94%    Neurologically intact Neurovascular intact Sensation intact distally Intact pulses distally Dorsiflexion/Plantar flexion intact Incision: dressing C/D/I and no drainage No cellulitis present Compartment soft   Lab Results  Component Value Date   WBC 6.1 06/27/2015   HGB 10.9* 06/27/2015   HCT 33.0* 06/27/2015   MCV 91.7 06/27/2015   PLT 131* 06/27/2015     Assessment/Plan:  4 Days Post-Op   - Incision c/d/i - dressing changed - SNF today  Marianna Payment 06/30/2015, 7:59 AM [phone]

## 2015-06-30 NOTE — Care Management Important Message (Signed)
Important Message  Patient Details  Name: Melinda [name] MRN: [id number] Date of Birth: [birth year]   Medicare Important Message Given:  Yes    Brysan Mcevoy P Kennie Snedden 06/30/2015, 1:22 PM

## 2015-06-30 NOTE — Clinical Social Work Note (Signed)
Patient to be d/c'ed today to [facility].  Patient and family agreeable to plans will transport via ems RN to call report to [phone].  Lael Wetherbee, MSW, LCSWA [phone]  

## 2015-06-30 NOTE — Discharge Summary (Signed)
Physician Discharge Summary      Patient ID: Melinda [name] MRN: [id number] DOB/AGE: 03-14-1931 82 y.o.  Admit date: 06/26/2015 Discharge date: 06/30/2015  Admission Diagnoses:  <principal problem not specified>  Discharge Diagnoses:  Active Problems:   Osteoarthritis of right hip   Hip joint replacement status   Past Medical History  Diagnosis Date  . Hypercholesterolemia   . Acid reflux   . Obstructive sleep apnea   . Depression   . Insomnia   . Premature atrial contractions   . Anxiety   . Hypertension   . Cataract     surgery,bilateral  . Pyloric antral stenosis     in childhood,infancy  . Multifactorial gait disorder   . Sleep apnea with use of continuous positive airway pressure (CPAP) 10/19/2012  . Diabetes mellitus without complication ([place])   . Shortness of breath dyspnea     with exertion  . Hx: UTI (urinary tract infection)   . Arthritis   . Constipation   . Family history of adverse reaction to anesthesia     mother always had n/v    Surgeries: Procedure(s): RIGHT TOTAL HIP ARTHROPLASTY ANTERIOR APPROACH on 06/26/2015   Consultants (if any):    Discharged Condition: Improved  Hospital Course: Melinda [name] is an 82 y.o. female who was admitted 06/26/2015 with a diagnosis of <principal problem not specified> and went to the operating room on 06/26/2015 and underwent the above named procedures.    She was given perioperative antibiotics:  Anti-infectives    Start     Dose/Rate Route Frequency Ordered Stop   06/26/15 1845  ceFAZolin (ANCEF) IVPB 2g/100 mL premix     2 g 200 mL/hr over 30 Minutes Intravenous Every 6 hours 06/26/15 1736 06/27/15 0459   06/26/15 0811  ceFAZolin (ANCEF) IVPB 2g/100 mL premix     2 g 200 mL/hr over 30 Minutes Intravenous On call to O.R. 06/26/15 6222 06/26/15 1245    .  She was given sequential compression devices, early ambulation, and aspirin for DVT prophylaxis.  She benefited maximally from the  hospital stay and there were no complications.    Recent vital signs:  Filed Vitals:   06/29/15 2127 06/30/15 0415  BP: 116/55 148/67  Pulse: 70 77  Temp: 98.1 F (36.7 C) 98.7 F (37.1 C)  Resp: 16 18    Recent laboratory studies:  Lab Results  Component Value Date   HGB 10.9* 06/27/2015   HGB 13.8 06/24/2015   Lab Results  Component Value Date   WBC 6.1 06/27/2015   PLT 131* 06/27/2015   Lab Results  Component Value Date   INR 1.03 06/24/2015   Lab Results  Component Value Date   NA 135 06/27/2015   K 4.1 06/27/2015   CL 102 06/27/2015   CO2 24 06/27/2015   BUN 14 06/27/2015   CREATININE 0.70 06/27/2015   GLUCOSE 148* 06/27/2015    Discharge Medications:     Medication List    STOP taking these medications        aspirin 81 MG tablet  Replaced by:  aspirin EC 325 MG tablet      TAKE these medications        ACCU-CHEK ACTIVE STRIPS test strip  Generic drug:  glucose blood  1 each by Other route as needed for other. Use as instructed     acetaminophen 650 MG CR tablet  Commonly known as:  TYLENOL  Take 650 mg by mouth every 8 (eight)  hours as needed for pain.     amLODipine 5 MG tablet  Commonly known as:  NORVASC  Take 5 mg by mouth daily.     aspirin EC 325 MG tablet  Take 1 tablet (325 mg total) by mouth 2 (two) times daily.     atorvastatin 40 MG tablet  Commonly known as:  LIPITOR  Take 40 mg by mouth daily. One tablet in am     baclofen 10 MG tablet  Commonly known as:  LIORESAL  Take 1 tablet (10 mg total) by mouth 3 (three) times daily as needed for muscle spasms.     benazepril 20 MG tablet  Commonly known as:  LOTENSIN  Take 20 mg by mouth daily.     CALCIUM 600+D3 600-400 MG-UNIT Tabs  Generic drug:  Calcium Carbonate-Vitamin D3  Take 1 tablet by mouth daily.     cholecalciferol 1000 units tablet  Commonly known as:  VITAMIN D  Take 1,000 Units by mouth daily.     desipramine 25 MG tablet  Commonly known as:  NORPRAMIN    Take 25 mg by mouth daily.     EX-LAX 15 MG Chew  Generic drug:  Sennosides  Chew by mouth. Takes 2 chews as needed     Fish Oil 1200 MG Caps  Take 1 capsule by mouth daily.     glucosamine-chondroitin 500-400 MG tablet  Take 1 tablet by mouth every other day.     linaclotide 145 MCG Caps capsule  Commonly known as:  LINZESS  Take 145 mcg by mouth daily before breakfast.     metoprolol 50 MG tablet  Commonly known as:  LOPRESSOR  Take 25 mg by mouth 2 (two) times daily. Take 1/2 tablet by mouth twice daily     omeprazole 20 MG capsule  Commonly known as:  PRILOSEC  Take 20 mg by mouth daily. One tablet daily in mornings     ondansetron 4 MG tablet  Commonly known as:  ZOFRAN  Take 1-2 tablets (4-8 mg total) by mouth every 8 (eight) hours as needed for nausea or vomiting.     ONE TOUCH ULTRA 2 w/Device Kit     ONETOUCH DELICA LANCETS 54O Misc     oxyCODONE 10 mg 12 hr tablet  Commonly known as:  OXYCONTIN  Take 1 tablet (10 mg total) by mouth every 12 (twelve) hours.     oxyCODONE 5 MG immediate release tablet  Commonly known as:  Oxy IR/ROXICODONE  Take 1-3 tablets (5-15 mg total) by mouth every 4 (four) hours as needed.     senna-docusate 8.6-50 MG tablet  Commonly known as:  SENOKOT S  Take 1 tablet by mouth at bedtime as needed.     temazepam 7.5 MG capsule  Commonly known as:  RESTORIL  Take 1 capsule (7.5 mg total) by mouth daily. Take 1-2 as needed     vitamin B-12 500 MCG tablet  Commonly known as:  CYANOCOBALAMIN  Take 1,000 mcg by mouth daily.        Diagnostic Studies: Dg Pelvis Portable  06/26/2015  CLINICAL DATA:  Status post total hip arthroplasty. EXAM: PORTABLE PELVIS 1-2 VIEWS COMPARISON:  Intraoperative films, same date. FINDINGS: Well seated components of a total hip arthroplasty. There is a small suspected acetabular fracture along the medial wall. The pubic symphysis and pubic rami are normal. The left hip is normally located. IMPRESSION:  Well seated components of a total right hip arthroplasty. Suspect small medial  wall acetabular fracture. These results will be called to the ordering clinician or representative by the Radiologist Assistant, and communication documented in the PACS or zVision Dashboard. Electronically Signed   By: Marijo Sanes M.D.   On: 06/26/2015 16:18   Ct Hip Right Wo Contrast  06/26/2015  CLINICAL DATA:  Status post right total hip arthroplasty. Possible fracture on postop x-ray. EXAM: CT OF THE RIGHT HIP WITHOUT CONTRAST TECHNIQUE: Multidetector CT imaging of the right hip was performed according to the standard protocol. Multiplanar CT image reconstructions were also generated. COMPARISON:  None. FINDINGS: There is interval right total hip arthroplasty. There is a nondisplaced fracture along the posterior medial aspect just peripheral to the acetabular cup component. There is no dislocation. There are postsurgical changes in the surrounding soft tissues with subcutaneous emphysema and mild edema. There is no focal fluid collection or hematoma. There is no aggressive lytic or sclerotic osseous lesion. There is no other fracture or dislocation. IMPRESSION: 1. Interval right total hip arthroplasty. Nondisplaced fracture along the posterior medial aspect of, just peripheral to, the acetabular cup component. 2. Postsurgical changes in the soft tissues surrounding the right hip. Electronically Signed   By: Kathreen Devoid   On: 06/26/2015 17:20   Dg Hip Operative Unilat W Or W/o Pelvis Right  06/26/2015  CLINICAL DATA:  Portable imaging from a right hip arthroplasty. EXAM: OPERATIVE RIGHT HIP (WITH PELVIS IF PERFORMED) 2 VIEWS TECHNIQUE: Fluoroscopic spot image(s) were submitted for interpretation post-operatively. COMPARISON:  None. FINDINGS: There is a total right hip arthroplasty. The femoral and acetabular components appear well seated and well aligned on the 2 AP views submitted. There is no acute fracture or evidence of  an operative complication. IMPRESSION: Well-aligned right hip total arthroplasty. Electronically Signed   By: Lajean Manes M.D.   On: 06/26/2015 15:24    Disposition: Final discharge disposition not confirmed      Discharge Instructions    Call MD / Call 911    Complete by:  As directed   If you experience chest pain or shortness of breath, CALL 911 and be transported to the hospital emergency room.  If you develope a fever above 101.5 F, pus (white drainage) or increased drainage or redness at the wound, or calf pain, call your surgeon's office.     Constipation Prevention    Complete by:  As directed   Drink plenty of fluids.  Prune juice may be helpful.  You may use a stool softener, such as Colace (over the counter) 100 mg twice a day.  Use MiraLax (over the counter) for constipation as needed.     Diet - low sodium heart healthy    Complete by:  As directed      Diet general    Complete by:  As directed      Driving restrictions    Complete by:  As directed   No driving while taking narcotic pain meds.     Increase activity slowly as tolerated    Complete by:  As directed            Follow-up Information    Follow up with Marianna Payment, MD In 2 weeks.   Specialty:  Orthopedic Surgery   Why:  For suture removal, For wound re-check   Contact information:   [address] [place] [address] [phone]        Signed: Marianna Payment 06/30/2015, 8:00 AM

## 2015-06-30 NOTE — Progress Notes (Signed)
Physical Therapy Treatment Patient Details Name: Melinda [name] MRN: [id number] DOB: 03/02/1932 Today's Date: 24-Jul-2015    History of Present Illness 82 yo admitted for right anterior THA. PMHx:depression, insomnia, HTN, DM, OSA, arthritis    PT Comments    Pt with continued progression with gait and did not require chair to follow today. Pt with increased ability with transfers and continues to need cues for HEP. Will follow.   Follow Up Recommendations  SNF;Supervision for mobility/OOB     Equipment Recommendations       Recommendations for Other Services       Precautions / Restrictions Precautions Precautions: Fall Precaution Comments: Direct anterior hip-no precautions Restrictions RLE Weight Bearing: Weight bearing as tolerated    Mobility  Bed Mobility               General bed mobility comments: EOB on arrival  Transfers Overall transfer level: Needs assistance   Transfers: Sit to/from Stand Sit to Stand: Supervision         General transfer comment: from bed, to/from toilet, to chair with cues x 1 for hand placement  Ambulation/Gait Ambulation/Gait assistance: Supervision Ambulation Distance (Feet): 125 Feet Assistive device: Rolling walker (2 wheeled) Gait Pattern/deviations: Trunk flexed;Decreased stride length   Gait velocity interpretation: Below normal speed for age/gender General Gait Details: cues for posture, looking up, position in [place] and step-through pattern   Stairs            Wheelchair Mobility    Modified Rankin (Stroke Patients Only)       Balance                                    Cognition Arousal/Alertness: Awake/alert Behavior During Therapy: WFL for tasks assessed/performed Overall Cognitive Status: Within Functional Limits for tasks assessed                      Exercises Total Joint Exercises Heel Slides: AAROM;Right;10 reps;Seated Hip ABduction/ADduction:  AAROM;Right;Seated;10 reps Long Arc Quad: AROM;Seated;Right;10 reps    General Comments        Pertinent Vitals/Pain Pain Assessment: 0-10 Pain Score: 2  Pain Location: right hip Pain Intervention(s): Limited activity within patient's tolerance;Repositioned;Ice applied    Home Living                      Prior Function            PT Goals (current goals can now be found in the care plan section) Progress towards PT goals: Progressing toward goals    Frequency       PT Plan Current plan remains appropriate    Co-evaluation             End of Session Equipment Utilized During Treatment: Gait belt Activity Tolerance: Patient tolerated treatment well Patient left: in chair;with call bell/phone within reach     Time: 0732-0802 PT Time Calculation (min) (ACUTE ONLY): 30 min  Charges:  $Gait Training: 8-22 mins $Therapeutic Exercise: 8-22 mins                    G Codes:      Melford Aase 24-Jul-2015, 8:07 AM Elwyn Reach, [place]

## 2015-07-01 ENCOUNTER — Non-Acute Institutional Stay (SKILLED_NURSING_FACILITY): Payer: Medicare HMO | Admitting: Internal Medicine

## 2015-07-01 ENCOUNTER — Encounter: Payer: Self-pay | Admitting: Internal Medicine

## 2015-07-01 DIAGNOSIS — R110 Nausea: Secondary | ICD-10-CM

## 2015-07-01 DIAGNOSIS — K5901 Slow transit constipation: Secondary | ICD-10-CM | POA: Diagnosis not present

## 2015-07-01 DIAGNOSIS — G4733 Obstructive sleep apnea (adult) (pediatric): Secondary | ICD-10-CM | POA: Diagnosis not present

## 2015-07-01 DIAGNOSIS — E785 Hyperlipidemia, unspecified: Secondary | ICD-10-CM | POA: Diagnosis not present

## 2015-07-01 DIAGNOSIS — G4700 Insomnia, unspecified: Secondary | ICD-10-CM

## 2015-07-01 DIAGNOSIS — I10 Essential (primary) hypertension: Secondary | ICD-10-CM

## 2015-07-01 DIAGNOSIS — M1611 Unilateral primary osteoarthritis, right hip: Secondary | ICD-10-CM

## 2015-07-01 DIAGNOSIS — R2681 Unsteadiness on feet: Secondary | ICD-10-CM | POA: Diagnosis not present

## 2015-07-01 DIAGNOSIS — F32A Depression, unspecified: Secondary | ICD-10-CM

## 2015-07-01 DIAGNOSIS — F329 Major depressive disorder, single episode, unspecified: Secondary | ICD-10-CM | POA: Diagnosis not present

## 2015-07-01 DIAGNOSIS — Z9989 Dependence on other enabling machines and devices: Secondary | ICD-10-CM

## 2015-07-01 NOTE — Progress Notes (Signed)
LOCATION: [facility]  PCP: Jerlyn Ly, MD   Code Status: Full Code  Goals of care: Advanced Directive information Advanced Directives 06/24/2015  Does patient have an advance directive? Yes  Type of Paramedic of [place];Living will  Does patient want to make changes to advanced directive? No - Patient declined  Copy of advanced directive(s) in chart? No - copy requested       Extended Emergency Contact Information Primary Emergency Contact: Easter,Martha Address: [place], [place] [address] [place] Phone: [phone] Mobile Phone: [phone] Relation: Sister Secondary Emergency Contact: Bull,Lisa Address: [address]          [place] [address] [place] Phone: [phone] Work Phone: [phone] Relation: Daughter   Allergies  Allergen Reactions  . Phenobarbital Itching    Chief Complaint  Patient presents with  . New Admit To SNF    New Admission     HPI:  Patient is a 82 y.o. female seen today for short term rehabilitation post hospital admission from 06/26/15-06/30/15 with right hip OA. She underwent right total hip arthroplasty on 06/26/15. She is seen in her room today. Her pain medication has been helpful. She has been constipated.   Review of Systems:  Constitutional: Negative for fever, chills, diaphoresis. Gets tired easily. Strength is slowly returning. HENT: Negative for headache, nasal discharge, hearing loss, sore throat, difficulty swallowing. Positive for chest congestion.   Eyes: Negative for blurred vision, double vision and discharge.Wears glasses.  Respiratory: Negative for shortness of breath and wheezing. Positive for dry cough.   Cardiovascular: Negative for chest pain, palpitations, leg swelling.  Gastrointestinal: Negative for heartburn,  vomiting, abdominal pain, loss of appetite. Last bowel movement was 5 days ago. Positive for  occasional nausea and chronic constipation. Genitourinary: Negative for dysuria and flank pain.  Musculoskeletal: Negative for back pain, fall in the facility.  Skin: Negative for itching, rash.  Neurological: Negative for weakness. Positive for dizziness. Psychiatric/Behavioral: Negative for depression.    Past Medical History  Diagnosis Date  . Hypercholesterolemia   . Acid reflux   . Obstructive sleep apnea   . Depression   . Insomnia   . Premature atrial contractions   . Anxiety   . Hypertension   . Cataract     surgery,bilateral  . Pyloric antral stenosis     in childhood,infancy  . Multifactorial gait disorder   . Sleep apnea with use of continuous positive airway pressure (CPAP) 10/19/2012  . Diabetes mellitus without complication ([place])   . Shortness of breath dyspnea     with exertion  . Hx: UTI (urinary tract infection)   . Arthritis   . Constipation   . Family history of adverse reaction to anesthesia     mother always had n/v   Past Surgical History  Procedure Laterality Date  . Abdominal hysterectomy  1970  . Transthoracic echocardiogram  07/23/2009    mild concentric LVH/mild mitral insufficiency/ moderate tricuspid innsufficiency/mild pulmonary HTN/EF- 55-60%  . Nm myoview ltd  11/30/2007    normal stress nuclear study/EF-83%  . Vaginal delivery    . Inner ear surgery Left     x 3  . Colonoscopy    . Laparoscopic salpingo oopherectomy    . Tonsillectomy    . Abdominal surgery      as a 62 day old baby  . Eye surgery Bilateral  cataract surgery with lens implant  . Total hip arthroplasty Right 06/26/2015    Procedure: RIGHT TOTAL HIP ARTHROPLASTY ANTERIOR APPROACH;  Surgeon: Leandrew Koyanagi, MD;  Location: [facility];  Service: Orthopedics;  Laterality: Right;   Social History:   reports that she has never smoked. She has never used smokeless tobacco. She reports that she does not drink alcohol or use illicit drugs.  Family History  Problem Relation Age of  Onset  . Heart attack Father   . Heart failure Mother   . Cancer Mother     colon,kidney  . Diabetes Sister   . Hypertension Sister   . Heart failure Sister     Medications:   Medication List       This list is accurate as of: 07/01/15  3:20 PM.  Always use your most recent med list.               [place] test strip  Generic drug:  glucose blood  1 each by Other route as needed for other. Use as instructed     acetaminophen 650 MG CR tablet  Commonly known as:  TYLENOL  Take 650 mg by mouth every 8 (eight) hours as needed for pain.     amLODipine 5 MG tablet  Commonly known as:  NORVASC  Take 5 mg by mouth daily.     aspirin EC 325 MG tablet  Take 1 tablet (325 mg total) by mouth 2 (two) times daily.     atorvastatin 40 MG tablet  Commonly known as:  LIPITOR  Take 40 mg by mouth daily. One tablet in am     baclofen 10 MG tablet  Commonly known as:  LIORESAL  Take 1 tablet (10 mg total) by mouth 3 (three) times daily as needed for muscle spasms.     benazepril 20 MG tablet  Commonly known as:  LOTENSIN  Take 20 mg by mouth daily.     CALCIUM 600+D3 600-400 MG-UNIT Tabs  Generic drug:  Calcium Carbonate-Vitamin D3  Take 1 tablet by mouth daily.     cholecalciferol 1000 units tablet  Commonly known as:  VITAMIN D  Take 1,000 Units by mouth daily.     desipramine 25 MG tablet  Commonly known as:  NORPRAMIN  Take 25 mg by mouth daily.     EX-LAX 15 MG Chew  Generic drug:  Sennosides  Chew by mouth. Takes 2 chews as needed     Fish Oil 1200 MG Caps  Take 1 capsule by mouth daily.     glucosamine-chondroitin 500-400 MG tablet  Take 1 tablet by mouth every other day.     linaclotide 145 MCG Caps capsule  Commonly known as:  LINZESS  Take 145 mcg by mouth daily before breakfast.     metoprolol 50 MG tablet  Commonly known as:  LOPRESSOR  Take 25 mg by mouth 2 (two) times daily. Take 1/2 tablet by mouth twice daily     omeprazole 20  MG capsule  Commonly known as:  PRILOSEC  Take 20 mg by mouth daily. One tablet daily in mornings     ondansetron 4 MG tablet  Commonly known as:  ZOFRAN  Take 1-2 tablets (4-8 mg total) by mouth every 8 (eight) hours as needed for nausea or vomiting.     ONE TOUCH ULTRA 2 w/Device Kit     ONETOUCH DELICA LANCETS 23J Misc     oxyCODONE 10 mg 12 hr tablet  Commonly known as:  OXYCONTIN  Take 1 tablet (10 mg total) by mouth every 12 (twelve) hours.     oxyCODONE 5 MG immediate release tablet  Commonly known as:  Oxy IR/ROXICODONE  Take 1-3 tablets (5-15 mg total) by mouth every 4 (four) hours as needed.     senna-docusate 8.6-50 MG tablet  Commonly known as:  SENOKOT S  Take 1 tablet by mouth at bedtime as needed.     temazepam 7.5 MG capsule  Commonly known as:  RESTORIL  Take 7.5 mg by mouth at bedtime.     vitamin B-12 500 MCG tablet  Commonly known as:  CYANOCOBALAMIN  Take 1,000 mcg by mouth daily.        Immunizations: Immunization History  Administered Date(s) Administered  . PPD Test 06/30/2015     Physical Exam: Filed Vitals:   07/01/15 1512  BP: 138/70  Pulse: 74  Temp: 98.8 F (37.1 C)  TempSrc: Oral  Resp: 20  Height: 5' (1.524 m)  Weight: 174 lb (78.926 kg)  SpO2: 97%   Body mass index is 33.98 kg/(m^2).  General- elderly female, obese, in no acute distress Head- normocephalic, atraumatic Nose- no maxillary or frontal sinus tenderness, no nasal discharge Throat- moist mucus membrane  Eyes- PERRLA, EOMI, no pallor, no icterus, no discharge, normal conjunctiva, normal sclera Neck- no cervical lymphadenopathy Cardiovascular- normal s1 and s2, no murmur, trace leg edema Respiratory- bilateral clear to auscultation, no wheeze, no rhonchi, no crackles, no use of accessory muscles Abdomen- bowel sounds present, soft, non tender Musculoskeletal- able to move all 4 extremities, limited right hip range of motion, ted hose both legs Neurological-  alert and oriented to person, place and time Skin- warm and dry, mepilex dressing to right surgical incision Psychiatry- normal mood and affect    Labs reviewed: Basic Metabolic Panel:  Recent Labs  06/24/15 1308 06/27/15 06/27/15 0439  NA 139 135* 135  K 4.3  --  4.1  CL 104  --  102  CO2 26  --  24  GLUCOSE 96  --  148*  BUN _0 CREATININE 0.85 0.7 0.70  CALCIUM 10.0  --  8.3*   Liver Function Tests:  Recent Labs  06/24/15 1308  AST 20  ALT 20  ALKPHOS 74  BILITOT 0.8  PROT 7.1  ALBUMIN 4.0   No results for input(s): LIPASE, AMYLASE in the last 8760 hours. No results for input(s): AMMONIA in the last 8760 hours. CBC:  Recent Labs  06/24/15 1308 06/27/15 06/27/15 0439  WBC 5.6 6.1 6.1  NEUTROABS 3.2  --   --   HGB 13.8  --  10.9*  HCT 41.2  --  33.0*  MCV 92.2  --  91.7  PLT 180  --  131*   Cardiac Enzymes: No results for input(s): CKTOTAL, CKMB, CKMBINDEX, TROPONINI in the last 8760 hours. BNP: Invalid input(s): POCBNP CBG:  Recent Labs  06/30/15 0710 06/30/15 1148 06/30/15 1613  GLUCAP 125* 126* 79    Radiological Exams: Dg Pelvis Portable  06/30/2015  CLINICAL DATA:  Status post right hip replacement EXAM: PORTABLE PELVIS 1-2 VIEWS COMPARISON:  06/26/2015 FINDINGS: Right hip replacement is again identified and stable. Undisplaced fracture in the medial aspect of the acetabulum is again noted and stable. The left hip is within normal limits. No other focal abnormality is seen. IMPRESSION: Status post right hip replacement. Stable acetabular fracture is noted. Electronically Signed   By: Linus Mako.D.  On: 06/30/2015 08:44   Dg Pelvis Portable  06/26/2015  CLINICAL DATA:  Status post total hip arthroplasty. EXAM: PORTABLE PELVIS 1-2 VIEWS COMPARISON:  Intraoperative films, same date. FINDINGS: Well seated components of a total hip arthroplasty. There is a small suspected acetabular fracture along the medial wall. The pubic symphysis and  pubic rami are normal. The left hip is normally located. IMPRESSION: Well seated components of a total right hip arthroplasty. Suspect small medial wall acetabular fracture. These results will be called to the ordering clinician or representative by the Radiologist Assistant, and communication documented in the PACS or zVision Dashboard. Electronically Signed   By: Marijo Sanes M.D.   On: 06/26/2015 16:18   Ct Hip Right Wo Contrast  06/26/2015  CLINICAL DATA:  Status post right total hip arthroplasty. Possible fracture on postop x-ray. EXAM: CT OF THE RIGHT HIP WITHOUT CONTRAST TECHNIQUE: Multidetector CT imaging of the right hip was performed according to the standard protocol. Multiplanar CT image reconstructions were also generated. COMPARISON:  None. FINDINGS: There is interval right total hip arthroplasty. There is a nondisplaced fracture along the posterior medial aspect just peripheral to the acetabular cup component. There is no dislocation. There are postsurgical changes in the surrounding soft tissues with subcutaneous emphysema and mild edema. There is no focal fluid collection or hematoma. There is no aggressive lytic or sclerotic osseous lesion. There is no other fracture or dislocation. IMPRESSION: 1. Interval right total hip arthroplasty. Nondisplaced fracture along the posterior medial aspect of, just peripheral to, the acetabular cup component. 2. Postsurgical changes in the soft tissues surrounding the right hip. Electronically Signed   By: Kathreen Devoid   On: 06/26/2015 17:20   Dg Hip Operative Unilat W Or W/o Pelvis Right  06/26/2015  CLINICAL DATA:  Portable imaging from a right hip arthroplasty. EXAM: OPERATIVE RIGHT HIP (WITH PELVIS IF PERFORMED) 2 VIEWS TECHNIQUE: Fluoroscopic spot image(s) were submitted for interpretation post-operatively. COMPARISON:  None. FINDINGS: There is a total right hip arthroplasty. The femoral and acetabular components appear well seated and well aligned on  the 2 AP views submitted. There is no acute fracture or evidence of an operative complication. IMPRESSION: Well-aligned right hip total arthroplasty. Electronically Signed   By: Lajean Manes M.D.   On: 06/26/2015 15:24    Assessment/Plan  Unsteady gait Will have patient work with PT/OT as tolerated to regain strength and restore function.  Fall precautions are in place.  Right hip OA S/p right total hip arthroplasty. Continue oxycontin 10 mg q 12h and oxyIR 5 mg 1-3 tab q4h prn pain. Has f/u with orthopedics. Will have her work with physical therapy and occupational therapy team to help with gait training and muscle strengthening exercises.fall precautions. Skin care. Encourage to be out of bed. Continue ca-vit d supplement. Continue aspirin 325 mg bid for dvt prophylaxis.   Constipation Currently on senokot s qhs prn and linzess 145 mcg daily. She mentions being on linzess 145 mcg daily with miralax bid at home and this helping her. Resume this and reassess. If no help, will change linzess to bid. Encouraged hydration  Blood loss anemia Post op, check cbc  HLD Continue lipitor  HTN Monitor BP, continue benazepril, lopressor and norvasc. Check bmp  Depression Continue desipramine  Nausea Continue zofran prn and monitor  Insomnia Continue temazepam  OSA Continue CPAP   Goals of care: short term rehabilitation   Labs/tests ordered: cbc, cmp 07/02/15  Family/ staff Communication: reviewed care plan with patient  and nursing supervisor    Blanchie Serve, MD Internal Medicine [place], [place] [address] Cell Phone (Monday-Friday 8 am - 5 pm): [phone] On Call: [phone] and follow prompts after 5 pm and on weekends Office Phone: [phone] Office Fax: [phone]

## 2015-07-02 LAB — HEPATIC FUNCTION PANEL
ALT: 12 U/L (ref 7–35)
AST: 13 U/L (ref 13–35)
Alkaline Phosphatase: 60 U/L (ref 25–125)
Bilirubin, Total: 1.1 mg/dL

## 2015-07-02 LAB — BASIC METABOLIC PANEL
BUN: 15 mg/dL (ref 4–21)
Creatinine: 0.6 mg/dL (ref 0.5–1.1)
Glucose: 132 mg/dL
POTASSIUM: 4.1 mmol/L (ref 3.4–5.3)
Sodium: 139 mmol/L (ref 137–147)

## 2015-07-02 LAB — CBC AND DIFFERENTIAL
HEMATOCRIT: 38 % (ref 36–46)
HEMOGLOBIN: 12.5 g/dL (ref 12.0–16.0)
Neutrophils Absolute: 3 /uL
PLATELETS: 230 10*3/uL (ref 150–399)
WBC: 5.9 10*3/mL

## 2015-07-08 ENCOUNTER — Ambulatory Visit (INDEPENDENT_AMBULATORY_CARE_PROVIDER_SITE_OTHER): Payer: Medicare HMO | Admitting: Cardiology

## 2015-07-08 ENCOUNTER — Encounter: Payer: Self-pay | Admitting: Cardiology

## 2015-07-08 VITALS — BP 117/65 | HR 90 | Ht 60.0 in | Wt 177.4 lb

## 2015-07-08 DIAGNOSIS — R079 Chest pain, unspecified: Secondary | ICD-10-CM

## 2015-07-08 DIAGNOSIS — R0600 Dyspnea, unspecified: Secondary | ICD-10-CM | POA: Insufficient documentation

## 2015-07-08 DIAGNOSIS — G4733 Obstructive sleep apnea (adult) (pediatric): Secondary | ICD-10-CM | POA: Diagnosis not present

## 2015-07-08 DIAGNOSIS — E785 Hyperlipidemia, unspecified: Secondary | ICD-10-CM

## 2015-07-08 DIAGNOSIS — Z9989 Dependence on other enabling machines and devices: Secondary | ICD-10-CM

## 2015-07-08 DIAGNOSIS — I10 Essential (primary) hypertension: Secondary | ICD-10-CM

## 2015-07-08 NOTE — Progress Notes (Signed)
Cardiology Office Note   Date:  07/08/2015   ID:  Melinda [name], DOB 01/26/1932, MRN [id number]  PCP:  Jerlyn Ly, MD  Cardiologist:   Peter Martinique, MD   Chief Complaint  Patient presents with  . New Evaluation    Referred by PCP Crist Infante, MD)  pt c/o occasional sharp chest pain on left side; SOB on exertion or on incline; loss of balance--no dizziness, has fallen before--not in last several months; swelling in left leg; surgery on right hip 4/20      History of Present Illness: Melinda [name] is a 82 y.o. female who presents for evaluation of dyspnea and chest pain at the request of Dr. Joylene Draft. She was seen by me in 2009. At that time she had a normal stress Myoview study. Echo showed mild LVH with normal EF. Mild pulmonary HTN. She reports now with symptoms of increased dyspnea on exertion. This has increased significantly over the past year. No cough, wheezing, edema. Weight has been stable. She has noted intermittent sharp pains across her chest. On one occasion she walked to her mailbox and developed a heavy weight on her chest. She did recently undergo a right THR. This was without complications. She is now at [facility] place for rehab but expects to be discharged in next week. She has some swelling in right leg since surgery. She does have a history of OSA and is followed by Dr. Brett Fairy and using CPAP. She does stay fatigued a lot.     Past Medical History  Diagnosis Date  . Hypercholesterolemia   . Acid reflux   . Obstructive sleep apnea   . Depression   . Insomnia   . Premature atrial contractions   . Anxiety   . Hypertension   . Cataract     surgery,bilateral  . Pyloric antral stenosis     in childhood,infancy  . Multifactorial gait disorder   . Sleep apnea with use of continuous positive airway pressure (CPAP) 10/19/2012  . Diabetes mellitus without complication ([place])   . Shortness of breath dyspnea     with exertion  . Hx: UTI (urinary tract  infection)   . Arthritis   . Constipation   . Family history of adverse reaction to anesthesia     mother always had n/v    Past Surgical History  Procedure Laterality Date  . Abdominal hysterectomy  1970  . Transthoracic echocardiogram  07/23/2009    mild concentric LVH/mild mitral insufficiency/ moderate tricuspid innsufficiency/mild pulmonary HTN/EF- 55-60%  . Nm myoview ltd  11/30/2007    normal stress nuclear study/EF-83%  . Vaginal delivery    . Inner ear surgery Left     x 3  . Colonoscopy    . Laparoscopic salpingo oopherectomy    . Tonsillectomy    . Abdominal surgery      as a 8 day old baby  . Eye surgery Bilateral     cataract surgery with lens implant  . Total hip arthroplasty Right 06/26/2015    Procedure: RIGHT TOTAL HIP ARTHROPLASTY ANTERIOR APPROACH;  Surgeon: Leandrew Koyanagi, MD;  Location: [facility];  Service: Orthopedics;  Laterality: Right;     Current Outpatient Prescriptions  Medication Sig Dispense Refill  . acetaminophen (TYLENOL) 650 MG CR tablet Take 650 mg by mouth every 8 (eight) hours as needed for pain.    [name] amLODipine (NORVASC) 5 MG tablet Take 5 mg by mouth daily.    [name] aspirin EC 325  MG tablet Take 1 tablet (325 mg total) by mouth 2 (two) times daily. 84 tablet 0  . atorvastatin (LIPITOR) 40 MG tablet Take 40 mg by mouth daily. One tablet in am    . benazepril (LOTENSIN) 20 MG tablet Take 20 mg by mouth daily.    . Blood Glucose Monitoring Suppl (ONE TOUCH ULTRA 2) W/DEVICE KIT     . Calcium Carbonate-Vitamin D3 (CALCIUM 600+D3) 600-400 MG-UNIT TABS Take 1 tablet by mouth daily.    . cholecalciferol (VITAMIN D) 1000 UNITS tablet Take 1,000 Units by mouth daily.    [name] desipramine (NORPRAMIN) 25 MG tablet Take 25 mg by mouth daily.    [name] glucosamine-chondroitin 500-400 MG tablet Take 1 tablet by mouth every other day.     [name] glucose blood (ACCU-CHEK ACTIVE STRIPS) test strip 1 each by Other route as needed for other. Use as instructed    . linaclotide  (LINZESS) 145 MCG CAPS capsule Take 145 mcg by mouth daily before breakfast.     . metoprolol (LOPRESSOR) 50 MG tablet Take 25 mg by mouth 2 (two) times daily. Take 1/2 tablet by mouth twice daily    . Omega-3 Fatty Acids (FISH OIL) 1200 MG CAPS Take 1 capsule by mouth daily.     [name] omeprazole (PRILOSEC) 20 MG capsule Take 20 mg by mouth daily. One tablet daily in mornings    . ondansetron (ZOFRAN) 4 MG tablet Take 1-2 tablets (4-8 mg total) by mouth every 8 (eight) hours as needed for nausea or vomiting. 40 tablet 0  . ONETOUCH DELICA LANCETS 54Y MISC     . senna-docusate (SENOKOT S) 8.6-50 MG tablet Take 1 tablet by mouth at bedtime as needed. 30 tablet 1  . Sennosides (EX-LAX) 15 MG CHEW Chew by mouth. Takes 2 chews as needed    . temazepam (RESTORIL) 7.5 MG capsule Take 7.5 mg by mouth at bedtime.    . vitamin B-12 (CYANOCOBALAMIN) 500 MCG tablet Take 1,000 mcg by mouth daily.      No current facility-administered medications for this visit.    Allergies:   Phenobarbital    Social History:  The patient  reports that she has never smoked. She has never used smokeless tobacco. She reports that she does not drink alcohol or use illicit drugs.   Family History:  The patient's family history includes Cancer in her mother; Diabetes in her sister; Heart attack in her father; Heart failure in her mother and sister; Hypertension in her sister.    ROS:  Please see the history of present illness.   Otherwise, review of systems are positive for none.   All other systems are reviewed and negative.    PHYSICAL EXAM: VS:  BP 117/65 mmHg  Pulse 90  Ht 5' (1.524 m)  Wt 80.468 kg (177 lb 6.4 oz)  BMI 34.65 kg/m2 , BMI Body mass index is 34.65 kg/(m^2). GEN: Well nourished, well developed, in no acute distressshe is seen in a wheelchair. HEENT: normal Neck: no JVD, carotid bruits, or masses Cardiac: RRR; no murmurs, rubs, or gallops,no edema  Respiratory:  clear to auscultation bilaterally, normal  work of breathing GI: soft, nontender, nondistended, + BS MS: no deformity or atrophy Skin: warm and dry, no rash Ext: 1+ RLE edema. Trace on left.  Neuro:  Strength and sensation are intact Psych: euthymic mood, full affect   EKG:  EKG is not ordered today. The ekg 06/24/15 demonstrates NSR with LAD and old septal infarct. I  have personally reviewed and interpreted this study.    Recent Labs: 06/24/2015: ALT 20 06/27/2015: BUN 14; Creatinine, Ser 0.70; Hemoglobin 10.9*; Platelets 131*; Potassium 4.1; Sodium 135    Lipid Panel No results found for: CHOL, TRIG, HDL, CHOLHDL, VLDL, LDLCALC, LDLDIRECT    Wt Readings from Last 3 Encounters:  07/08/15 80.468 kg (177 lb 6.4 oz)  07/01/15 78.926 kg (174 lb)  06/26/15 78.971 kg (174 lb 1.6 oz)      Other studies Reviewed: Additional studies/ records that were reviewed today include: Labs from Dr. Bubba Camp and Dr. Joylene Draft. Review of the above records demonstrates: labs dated 07/02/15: Normal CBC. CMET with glucose 132, protein 5.8. Otherwise normal. Labs 05/19/15: similar. Cholesterol 152, triglycerides 204, HDL 51, LDL 60.  ASSESSMENT AND PLAN:  1.  Dyspnea on exertion. Worsening over the past year. Need to consider cardiac etiology. Will update Echo. Assess for pulmonary HTN. Differential also includes pulmonary disease (asthma, OSA). Doubt PE. Suspect deconditioning is playing a role  2. Atypical chest pain. Will schedule for Lexiscan myoview to evaluated risk of ischemic heart disease.   3. OSA on CPAP  4. S/p right THR.   5. HTN  6. Hyperlipidemia.    Current medicines are reviewed at length with the patient today.  The patient does not have concerns regarding medicines.  The following changes have been made:  no change  Labs/ tests ordered today include:  Orders Placed This Encounter  Procedures  . Myocardial Perfusion Imaging  . ECHOCARDIOGRAM COMPLETE     Disposition:   FU with 2 months post above testing.    Signed, Peter Martinique, MD  07/08/2015 12:56 PM    [facility] [address] [place], [name], [address] Phone [phone], Fax [phone]

## 2015-07-08 NOTE — Patient Instructions (Signed)
We will schedule you for an Echocardiogram and a nuclear stress test   

## 2015-07-11 ENCOUNTER — Encounter: Payer: Self-pay | Admitting: Adult Health

## 2015-07-11 ENCOUNTER — Non-Acute Institutional Stay (SKILLED_NURSING_FACILITY): Payer: Medicare HMO | Admitting: Adult Health

## 2015-07-11 DIAGNOSIS — F329 Major depressive disorder, single episode, unspecified: Secondary | ICD-10-CM

## 2015-07-11 DIAGNOSIS — G4733 Obstructive sleep apnea (adult) (pediatric): Secondary | ICD-10-CM | POA: Diagnosis not present

## 2015-07-11 DIAGNOSIS — K219 Gastro-esophageal reflux disease without esophagitis: Secondary | ICD-10-CM

## 2015-07-11 DIAGNOSIS — E785 Hyperlipidemia, unspecified: Secondary | ICD-10-CM | POA: Diagnosis not present

## 2015-07-11 DIAGNOSIS — G4700 Insomnia, unspecified: Secondary | ICD-10-CM | POA: Diagnosis not present

## 2015-07-11 DIAGNOSIS — K5900 Constipation, unspecified: Secondary | ICD-10-CM

## 2015-07-11 DIAGNOSIS — M1611 Unilateral primary osteoarthritis, right hip: Secondary | ICD-10-CM

## 2015-07-11 DIAGNOSIS — I10 Essential (primary) hypertension: Secondary | ICD-10-CM

## 2015-07-11 DIAGNOSIS — F32A Depression, unspecified: Secondary | ICD-10-CM

## 2015-07-11 DIAGNOSIS — K5909 Other constipation: Secondary | ICD-10-CM

## 2015-07-11 DIAGNOSIS — Z9989 Dependence on other enabling machines and devices: Secondary | ICD-10-CM

## 2015-07-11 NOTE — Progress Notes (Signed)
Patient ID: Melinda [name], female   DOB: 10-05-1931, 82 y.o.   MRN: [id number]    DATE:  07/11/2015   MRN:  [id number]  BIRTHDAY: 11-Jul-1931  Facility:  [facility]ursing Home Location:  [place] Room Number: 829-H  LEVEL OF CARE:  SNF [phone])  Contact Information    Name Relation Home Work Mobile   [place] Sister [phone]  [place] Daughter [phone]    Wray Kearns   [phone]       Code Status History    This patient does not have a recorded code status. Please follow your organizational policy for patients in this situation.       Chief Complaint  Patient presents with  . Discharge Note    HISTORY OF PRESENT ILLNESS:  This is an 82 year old female who is for discharge home with Home health PT, OT and CNA. DME:  Bedside commode and rolling walker.  She has been admitted to [facility] on 06/30/15 from [facility] She has PMH of Acid reflux, OSA, depression, insomnia, premature atrial contractions, hypertension, anxiety and hypercholesterolemia. She has osteoarthritis of the right hip for which she had right total hip arthroplasty on 06/26/15.  Patient was admitted to this facility for short-term rehabilitation after the patient's recent hospitalization.  Patient has completed SNF rehabilitation and therapy has cleared the patient for discharge.   PAST MEDICAL HISTORY:  Past Medical History  Diagnosis Date  . Hypercholesterolemia   . Acid reflux   . Obstructive sleep apnea   . Depression   . Insomnia   . Premature atrial contractions   . Anxiety   . Hypertension   . Cataract     surgery,bilateral  . Pyloric antral stenosis     in childhood,infancy  . Multifactorial gait disorder   . Sleep apnea with use of continuous positive airway pressure (CPAP) 10/19/2012  . Diabetes mellitus without complication ([place])   . Shortness of breath dyspnea     with exertion  . Hx: UTI (urinary  tract infection)   . Arthritis   . Constipation   . Family history of adverse reaction to anesthesia     mother always had n/v     CURRENT MEDICATIONS: Reviewed  Patient's Medications  New Prescriptions   No medications on file  Previous Medications   ACETAMINOPHEN (TYLENOL) 650 MG CR TABLET    Take 650 mg by mouth every 8 (eight) hours as needed for pain.   AMLODIPINE (NORVASC) 5 MG TABLET    Take 5 mg by mouth daily.   ASPIRIN EC 325 MG TABLET    Take 1 tablet (325 mg total) by mouth 2 (two) times daily.   ATORVASTATIN (LIPITOR) 40 MG TABLET    Take 40 mg by mouth daily. One tablet in am   BACLOFEN (LIORESAL) 10 MG TABLET    Take 10 mg by mouth 3 (three) times daily.   BLOOD GLUCOSE MONITORING SUPPL (ONE TOUCH ULTRA 2) W/DEVICE KIT    Reported on 07/11/2015   CALCIUM CARBONATE-VITAMIN D3 (CALCIUM 600+D3) 600-400 MG-UNIT TABS    Take 1 tablet by mouth daily.   CHOLECALCIFEROL (VITAMIN D) 1000 UNITS TABLET    Take 1,000 Units by mouth daily. D3   DESIPRAMINE (NORPRAMIN) 25 MG TABLET    Take 25 mg by mouth daily.   GLUCOSAMINE-CHONDROITIN 500-400 MG TABLET    Take 1 tablet by mouth every other day.    GLUCOSE BLOOD (ACCU-CHEK  ACTIVE STRIPS) TEST STRIP    1 each by Other route as needed for other. Reported on 07/11/2015   LINACLOTIDE (LINZESS) 145 MCG CAPS CAPSULE    Take 145 mcg by mouth daily before breakfast.    LISINOPRIL (PRINIVIL,ZESTRIL) 20 MG TABLET    Take 20 mg by mouth daily.   METOPROLOL (LOPRESSOR) 50 MG TABLET    Take 25 mg by mouth 2 (two) times daily. Take 1/2 tablet by mouth twice daily   OMEGA-3 FATTY ACIDS (FISH OIL) 1200 MG CAPS    Take 1 capsule by mouth daily.    OMEPRAZOLE (PRILOSEC) 20 MG CAPSULE    Take 20 mg by mouth daily. One tablet daily in mornings   ONDANSETRON (ZOFRAN) 4 MG TABLET    Take 1-2 tablets (4-8 mg total) by mouth every 8 (eight) hours as needed for nausea or vomiting.   ONETOUCH DELICA LANCETS 16X MISC    Reported on 07/11/2015   OXYCODONE (OXY  IR/ROXICODONE) 5 MG IMMEDIATE RELEASE TABLET    Take 5-15 mg by mouth every 4 (four) hours as needed for severe pain.   OXYCODONE (OXYCONTIN) 10 MG 12 HR TABLET    Take 10 mg by mouth every 12 (twelve) hours.   POLYETHYLENE GLYCOL (MIRALAX / GLYCOLAX) PACKET    Take 17 g by mouth 2 (two) times daily.   PROTEIN (PROCEL 100) POWD    Take 1 scoop by mouth 2 (two) times daily.   SENNA-DOCUSATE (SENOKOT S) 8.6-50 MG TABLET    Take 1 tablet by mouth at bedtime as needed.   SENNOSIDES (EX-LAX) 15 MG CHEW    Chew by mouth daily as needed. Takes 2 chews as needed   TEMAZEPAM (RESTORIL) 7.5 MG CAPSULE    Take 7.5 mg by mouth at bedtime.   VITAMIN B-12 (CYANOCOBALAMIN) 500 MCG TABLET    Take 1,000 mcg by mouth daily.   Modified Medications   No medications on file  Discontinued Medications   BENAZEPRIL (LOTENSIN) 20 MG TABLET    Take 20 mg by mouth daily.     Allergies  Allergen Reactions  . Phenobarbital Itching     REVIEW OF SYSTEMS:  GENERAL: no change in appetite, no fatigue, no weight changes, no fever, chills or weakness EYES: Denies change in vision, dry eyes, eye pain, itching or discharge EARS: Denies change in hearing, ringing in ears, or earache NOSE: Denies nasal congestion or epistaxis MOUTH and THROAT: Denies oral discomfort, gingival pain or bleeding, pain from teeth or hoarseness   RESPIRATORY: no cough, SOB, DOE, wheezing, hemoptysis CARDIAC: no chest pain, edema or palpitations GI: no abdominal pain, diarrhea, constipation, heart burn, nausea or vomiting GU: Denies dysuria, frequency, hematuria, incontinence, or discharge PSYCHIATRIC: Denies feeling of depression or anxiety. No report of hallucinations, insomnia, paranoia, or agitation    PHYSICAL EXAMINATION  GENERAL APPEARANCE: Well nourished. In no acute distress. Normal body habitus SKIN:  Right hip surgical incision is dry, no redness HEAD: Normal in size and contour. No evidence of trauma EYES: Lids open and  close normally. No blepharitis, entropion or ectropion. PERRL. Conjunctivae are clear and sclerae are white. Lenses are without opacity EARS: Pinnae are normal. Patient hears normal voice tunes of the examiner MOUTH and THROAT: Lips are without lesions. Oral mucosa is moist and without lesions. Tongue is normal in shape, size, and color and without lesions NECK: supple, trachea midline, no neck masses, no thyroid tenderness, no thyromegaly LYMPHATICS: no LAN in the neck, no supraclavicular  LAN RESPIRATORY: breathing is even & unlabored, BS CTAB CARDIAC: RRR, no murmur,no extra heart sounds, no edema GI: abdomen soft, normal BS, no masses, no tenderness, no hepatomegaly, no splenomegaly EXTREMITIES:  Able to move X 4 extremities PSYCHIATRIC: Alert and oriented X 3. Affect and behavior are appropriate  LABS/RADIOLOGY: Labs reviewed: Basic Metabolic Panel:  Recent Labs  06/24/15 1308 06/27/15 06/27/15 0439 07/02/15  NA 139 135* 135 139  K 4.3  --  4.1 4.1  CL 104  --  102  --   CO2 26  --  24  --   GLUCOSE 96  --  148*  --   BUN '16 14 14 15  ' CREATININE 0.85 0.7 0.70 0.6  CALCIUM 10.0  --  8.3*  --    Liver Function Tests:  Recent Labs  06/24/15 1308 07/02/15  AST 20 13  ALT 20 12  ALKPHOS 74 60  BILITOT 0.8  --   PROT 7.1  --   ALBUMIN 4.0  --    CBC:  Recent Labs  06/24/15 1308 06/27/15 06/27/15 0439 07/02/15  WBC 5.6 6.1 6.1 5.9  NEUTROABS 3.2  --   --  3  HGB 13.8  --  10.9* 12.5  HCT 41.2  --  33.0* 38  MCV 92.2  --  91.7  --   PLT 180  --  131* 230   CBG:  Recent Labs  06/30/15 0710 06/30/15 1148 06/30/15 1613  GLUCAP 125* 126* 55      Dg Pelvis Portable  06/30/2015  CLINICAL DATA:  Status post right hip replacement EXAM: PORTABLE PELVIS 1-2 VIEWS COMPARISON:  06/26/2015 FINDINGS: Right hip replacement is again identified and stable. Undisplaced fracture in the medial aspect of the acetabulum is again noted and stable. The left hip is within normal  limits. No other focal abnormality is seen. IMPRESSION: Status post right hip replacement. Stable acetabular fracture is noted. Electronically Signed   By: Inez Catalina M.D.   On: 06/30/2015 08:44   Dg Pelvis Portable  06/26/2015  CLINICAL DATA:  Status post total hip arthroplasty. EXAM: PORTABLE PELVIS 1-2 VIEWS COMPARISON:  Intraoperative films, same date. FINDINGS: Well seated components of a total hip arthroplasty. There is a small suspected acetabular fracture along the medial wall. The pubic symphysis and pubic rami are normal. The left hip is normally located. IMPRESSION: Well seated components of a total right hip arthroplasty. Suspect small medial wall acetabular fracture. These results will be called to the ordering clinician or representative by the Radiologist Assistant, and communication documented in the PACS or zVision Dashboard. Electronically Signed   By: Marijo Sanes M.D.   On: 06/26/2015 16:18   Ct Hip Right Wo Contrast  06/26/2015  CLINICAL DATA:  Status post right total hip arthroplasty. Possible fracture on postop x-ray. EXAM: CT OF THE RIGHT HIP WITHOUT CONTRAST TECHNIQUE: Multidetector CT imaging of the right hip was performed according to the standard protocol. Multiplanar CT image reconstructions were also generated. COMPARISON:  None. FINDINGS: There is interval right total hip arthroplasty. There is a nondisplaced fracture along the posterior medial aspect just peripheral to the acetabular cup component. There is no dislocation. There are postsurgical changes in the surrounding soft tissues with subcutaneous emphysema and mild edema. There is no focal fluid collection or hematoma. There is no aggressive lytic or sclerotic osseous lesion. There is no other fracture or dislocation. IMPRESSION: 1. Interval right total hip arthroplasty. Nondisplaced fracture along the posterior medial aspect of,  just peripheral to, the acetabular cup component. 2. Postsurgical changes in the soft  tissues surrounding the right hip. Electronically Signed   By: Kathreen Devoid   On: 06/26/2015 17:20   Dg Hip Operative Unilat W Or W/o Pelvis Right  06/26/2015  CLINICAL DATA:  Portable imaging from a right hip arthroplasty. EXAM: OPERATIVE RIGHT HIP (WITH PELVIS IF PERFORMED) 2 VIEWS TECHNIQUE: Fluoroscopic spot image(s) were submitted for interpretation post-operatively. COMPARISON:  None. FINDINGS: There is a total right hip arthroplasty. The femoral and acetabular components appear well seated and well aligned on the 2 AP views submitted. There is no acute fracture or evidence of an operative complication. IMPRESSION: Well-aligned right hip total arthroplasty. Electronically Signed   By: Lajean Manes M.D.   On: 06/26/2015 15:24    ASSESSMENT/PLAN:  Osteoarthritis S/P right total hip arthroplasty - for home health PT, OT and CNA; continue oxycodone 5 mg 1-3 tabs by mouth every 4 hours when necessary, Tylenol 325 mg take 2 tabs by mouth every 8 hours when necessary and OxyContin 10 mg 12 hour 1 tab by mouth every 12 hours; baclofen 10 mg 1 tab by mouth 3 times a day when necessary for muscle spasm; aspirin 325 mg 1 tab by mouth twice a day till 08/07/15 for DVT prophylaxis; follow-up with orthopedic surgeon  Chronic Constipation - continue Linzess 145 mcg 1 capsule  Daily, sennosides to chews by mouth daily when necessary, Senokot S1 tab by mouth daily at bedtime when necessary and MiraLAX 17 g by mouth twice a day  Hypertension - continue Norvasc 5 mg 1 tab by mouth daily, metoprolol titrate 25 mg 1 tab by mouth twice a day and lisinopril 20 mg 1 tab by mouth daily  Insomnia - continue temazepam 7.5 mg 1 capsule by mouth daily at bedtime  Hyperlipidemia - continue Lipitor 40 mg 1 tab by mouth daily  GERD - continue omeprazole 20 mg 1 capsule by mouth daily  OSA - continue CPAP at at bedtime  Depression - mood is stable; continue desipramine 25 mg 1 tab by mouth daily     I have filled  out patient's discharge paperwork and written prescriptions.  Patient will receive home health PT, OT and CNA.  DME provided:  Bedside commode and rolling walker  Total discharge time: Greater than 30 minutes  Discharge time involved coordination of the discharge process with social worker, nursing staff and therapy department. Medical justification for home health services/DME verified.     Durenda Age, NP [employer] [phone]

## 2015-07-12 ENCOUNTER — Other Ambulatory Visit: Payer: Self-pay | Admitting: Adult Health

## 2015-07-13 ENCOUNTER — Encounter (HOSPITAL_COMMUNITY): Payer: Self-pay | Admitting: Emergency Medicine

## 2015-07-13 ENCOUNTER — Observation Stay (HOSPITAL_COMMUNITY)
Admission: EM | Admit: 2015-07-13 | Discharge: 2015-07-15 | Disposition: A | Discharge status: Home / Self Care | Payer: Medicare HMO | Attending: Internal Medicine | Admitting: Internal Medicine

## 2015-07-13 DIAGNOSIS — I10 Essential (primary) hypertension: Secondary | ICD-10-CM | POA: Diagnosis not present

## 2015-07-13 DIAGNOSIS — Z7982 Long term (current) use of aspirin: Secondary | ICD-10-CM | POA: Diagnosis not present

## 2015-07-13 DIAGNOSIS — Z96641 Presence of right artificial hip joint: Secondary | ICD-10-CM | POA: Insufficient documentation

## 2015-07-13 DIAGNOSIS — I82409 Acute embolism and thrombosis of unspecified deep veins of unspecified lower extremity: Secondary | ICD-10-CM

## 2015-07-13 DIAGNOSIS — I480 Paroxysmal atrial fibrillation: Secondary | ICD-10-CM | POA: Diagnosis present

## 2015-07-13 DIAGNOSIS — I272 Other secondary pulmonary hypertension: Principal | ICD-10-CM | POA: Insufficient documentation

## 2015-07-13 DIAGNOSIS — R079 Chest pain, unspecified: Secondary | ICD-10-CM | POA: Diagnosis present

## 2015-07-13 DIAGNOSIS — K219 Gastro-esophageal reflux disease without esophagitis: Secondary | ICD-10-CM | POA: Insufficient documentation

## 2015-07-13 DIAGNOSIS — E119 Type 2 diabetes mellitus without complications: Secondary | ICD-10-CM | POA: Insufficient documentation

## 2015-07-13 DIAGNOSIS — I2721 Secondary pulmonary arterial hypertension: Secondary | ICD-10-CM | POA: Insufficient documentation

## 2015-07-13 HISTORY — DX: Paroxysmal atrial fibrillation: I48.0

## 2015-07-13 NOTE — ED Notes (Signed)
Pt presents to ER from home with [facility] for sudden onset LEFT sided CP that radiates to the back; pt denies N/V/D, denies SOB, denies diaphoresis; pt recently D/C from [facility] for s/p RIGHT hip replacement; EMS administered 324mg  ASA and x3 SL NTG; pt has hx of afib

## 2015-07-13 NOTE — ED Provider Notes (Signed)
CSN: [id number]     Arrival date & time 07/13/15  2343 History  By signing my name below, I, Altamease Oiler, attest that this documentation has been prepared under the direction and in the presence of Orpah Greek, MD. Electronically Signed: Altamease Oiler, ED Scribe. 07/14/2015. 12:57 AM   Chief Complaint  Patient presents with  . Chest Pain    The history is provided by the patient. No language interpreter was used.   Brought in by EMS, Melinda [name] is a 82 y.o. female with PMHx of DM, HTN, hypercholesteremia, and acid reflux who presents to the Emergency Department complaining of heavy left-sided chest pain that with onset tonight, approximately 1 hour PTA. She notes some pain radiation to the back. The pain has improved since initial onset. She notes that the pain was 5-6/10 in severity initially but is now 2-3/10 in severity.  This pain is different than the sharp pain that she was recently seen by her cardiologist for. She states that she measured her heart rate during the episode and it was 104-113. Associated symptoms include SOB. Pt denies nausea and sweating.   Past Medical History  Diagnosis Date  . Hypercholesterolemia   . Acid reflux   . Obstructive sleep apnea   . Depression   . Insomnia   . Premature atrial contractions   . Anxiety   . Hypertension   . Cataract     surgery,bilateral  . Pyloric antral stenosis     in childhood,infancy  . Multifactorial gait disorder   . Sleep apnea with use of continuous positive airway pressure (CPAP) 10/19/2012  . Diabetes mellitus without complication ([place])   . Shortness of breath dyspnea     with exertion  . Hx: UTI (urinary tract infection)   . Arthritis   . Constipation   . Family history of adverse reaction to anesthesia     mother always had n/v   Past Surgical History  Procedure Laterality Date  . Abdominal hysterectomy  1970  . Transthoracic echocardiogram  07/23/2009    mild concentric LVH/mild  mitral insufficiency/ moderate tricuspid innsufficiency/mild pulmonary HTN/EF- 55-60%  . Nm myoview ltd  11/30/2007    normal stress nuclear study/EF-83%  . Vaginal delivery    . Inner ear surgery Left     x 3  . Colonoscopy    . Laparoscopic salpingo oopherectomy    . Tonsillectomy    . Abdominal surgery      as a 43 day old baby  . Eye surgery Bilateral     cataract surgery with lens implant  . Total hip arthroplasty Right 06/26/2015    Procedure: RIGHT TOTAL HIP ARTHROPLASTY ANTERIOR APPROACH;  Surgeon: Leandrew Koyanagi, MD;  Location: [facility];  Service: Orthopedics;  Laterality: Right;   Family History  Problem Relation Age of Onset  . Heart attack Father   . Heart failure Mother   . Cancer Mother     colon,kidney  . Diabetes Sister   . Hypertension Sister   . Heart failure Sister    Social History  Substance Use Topics  . Smoking status: Never Smoker   . Smokeless tobacco: Never Used  . Alcohol Use: No     Comment: rare   OB History    No data available     Review of Systems  Constitutional: Negative for diaphoresis.  Respiratory: Positive for shortness of breath.   Cardiovascular: Positive for chest pain.  Gastrointestinal: Negative for nausea.  All other  systems reviewed and are negative.  Allergies  Phenobarbital  Home Medications   Prior to Admission medications   Medication Sig Start Date End Date Taking? Authorizing Provider  acetaminophen (TYLENOL) 650 MG CR tablet Take 650 mg by mouth every 8 (eight) hours as needed for pain.   Yes Historical Provider, MD  amLODipine (NORVASC) 5 MG tablet Take 5 mg by mouth daily.   Yes Historical Provider, MD  aspirin EC 325 MG tablet Take 1 tablet (325 mg total) by mouth 2 (two) times daily. 06/26/15  Yes Leandrew Koyanagi, MD  atorvastatin (LIPITOR) 40 MG tablet Take 40 mg by mouth daily. One tablet in am   Yes Historical Provider, MD  baclofen (LIORESAL) 10 MG tablet Take 10 mg by mouth 3 (three) times daily.   Yes Historical  Provider, MD  Calcium Carbonate-Vitamin D3 (CALCIUM 600+D3) 600-400 MG-UNIT TABS Take 1 tablet by mouth daily.   Yes Historical Provider, MD  cholecalciferol (VITAMIN D) 1000 UNITS tablet Take 1,000 Units by mouth daily. D3   Yes Historical Provider, MD  desipramine (NORPRAMIN) 25 MG tablet Take 25 mg by mouth daily.   Yes Historical Provider, MD  glucosamine-chondroitin 500-400 MG tablet Take 1 tablet by mouth every other day.    Yes Historical Provider, MD  linaclotide (LINZESS) 145 MCG CAPS capsule Take 145 mcg by mouth daily before breakfast.    Yes Historical Provider, MD  lisinopril (PRINIVIL,ZESTRIL) 20 MG tablet Take 20 mg by mouth daily.   Yes Historical Provider, MD  metoprolol (LOPRESSOR) 50 MG tablet Take 25 mg by mouth 2 (two) times daily. Take 1/2 tablet by mouth twice daily   Yes Historical Provider, MD  Omega-3 Fatty Acids (FISH OIL) 1200 MG CAPS Take 1 capsule by mouth daily.    Yes Historical Provider, MD  omeprazole (PRILOSEC) 20 MG capsule Take 20 mg by mouth daily. One tablet daily in mornings   Yes Historical Provider, MD  polyethylene glycol (MIRALAX / GLYCOLAX) packet Take 17 g by mouth 2 (two) times daily.   Yes Historical Provider, MD  Protein (PROCEL 100) POWD Take 1 scoop by mouth 2 (two) times daily.   Yes Historical Provider, MD  temazepam (RESTORIL) 7.5 MG capsule Take 7.5 mg by mouth at bedtime.   Yes Historical Provider, MD  vitamin B-12 (CYANOCOBALAMIN) 500 MCG tablet Take 1,000 mcg by mouth daily.    Yes Historical Provider, MD  Blood Glucose Monitoring Suppl (ONE TOUCH ULTRA 2) W/DEVICE KIT Reported on 07/11/2015 06/21/13   Historical Provider, MD  glucose blood (ACCU-CHEK ACTIVE STRIPS) test strip 1 each by Other route as needed for other. Reported on 07/11/2015    Historical Provider, MD  ondansetron (ZOFRAN) 4 MG tablet Take 1-2 tablets (4-8 mg total) by mouth every 8 (eight) hours as needed for nausea or vomiting. Patient not taking: Reported on 07/14/2015 06/26/15    Leandrew Koyanagi, MD  [facility] DELICA LANCETS 69G MISC Reported on 07/11/2015 06/21/13   Historical Provider, MD  senna-docusate (SENOKOT S) 8.6-50 MG tablet Take 1 tablet by mouth at bedtime as needed. Patient not taking: Reported on 07/14/2015 06/26/15   Leandrew Koyanagi, MD   BP 118/71 mmHg  Pulse 90  Resp 20  SpO2 95% Physical Exam  Constitutional: She is oriented to person, place, and time. She appears well-developed and well-nourished. No distress.  HENT:  Head: Normocephalic and atraumatic.  Right Ear: Hearing normal.  Left Ear: Hearing normal.  Nose: Nose normal.  Mouth/Throat: Oropharynx  is clear and moist and mucous membranes are normal.  Eyes: Conjunctivae and EOM are normal. Pupils are equal, round, and reactive to light.  Neck: Normal range of motion. Neck supple.  Cardiovascular: Regular rhythm, S1 normal and S2 normal.  Exam reveals no gallop and no friction rub.   No murmur heard. Pulmonary/Chest: Effort normal and breath sounds normal. No respiratory distress. She exhibits no tenderness.  Abdominal: Soft. Normal appearance and bowel sounds are normal. There is no hepatosplenomegaly. There is no tenderness. There is no rebound, no guarding, no tenderness at McBurney's point and negative Murphy's sign. No hernia.  Musculoskeletal: Normal range of motion.  Neurological: She is alert and oriented to person, place, and time. She has normal strength. No cranial nerve deficit or sensory deficit. Coordination normal. GCS eye subscore is 4. GCS verbal subscore is 5. GCS motor subscore is 6.  Skin: Skin is warm, dry and intact. No rash noted. No cyanosis.  Psychiatric: She has a normal mood and affect. Her speech is normal and behavior is normal. Thought content normal.  Nursing note and vitals reviewed.   ED Course  Procedures (including critical care time) COORDINATION OF CARE: 11:57 PM Discussed treatment plan which includes lab work, CXR, EKG with pt at bedside and pt agreed to  plan.  Labs Review Labs Reviewed  CBC WITH DIFFERENTIAL/PLATELET - Abnormal; Notable for the following:    RBC 3.25 (*)    Hemoglobin 10.0 (*)    HCT 30.8 (*)    All other components within normal limits  COMPREHENSIVE METABOLIC PANEL - Abnormal; Notable for the following:    Glucose, Bld 164 (*)    Total Protein 5.8 (*)    Albumin 3.2 (*)    All other components within normal limits  BRAIN NATRIURETIC PEPTIDE - Abnormal; Notable for the following:    B Natriuretic Peptide 640.5 (*)    All other components within normal limits  TROPONIN I    Imaging Review Dg Chest 2 View  07/14/2015  CLINICAL DATA:  Lower mid chest pain earlier tonight. EXAM: CHEST  2 VIEW COMPARISON:  07/13/2004 FINDINGS: The lungs are clear. The pulmonary vasculature is normal. Heart size is normal. Hilar and mediastinal contours are unremarkable. There is no pleural effusion. IMPRESSION: No active cardiopulmonary disease. Electronically Signed   By: Andreas Newport M.D.   On: 07/14/2015 01:21   Ct Angio Chest Pe W/cm &/or Wo Cm  07/14/2015  CLINICAL DATA:  Chest pain and dyspnea. EXAM: CT ANGIOGRAPHY CHEST WITH CONTRAST TECHNIQUE: Multidetector CT imaging of the chest was performed using the standard protocol during bolus administration of intravenous contrast. Multiplanar CT image reconstructions and MIPs were obtained to evaluate the vascular anatomy. CONTRAST:  100 mL Isovue 370 intravenous COMPARISON:  Radiographs 07/14/2015 FINDINGS: Cardiovascular: There is good opacification of the pulmonary arteries. There is no pulmonary embolism. The thoracic aorta is normal in caliber and intact. Lungs: Clear Central airways: Beta Effusions: None Lymphadenopathy: None Esophagus: Unremarkable Upper abdomen: Unremarkable Musculoskeletal: No significant abnormality Review of the MIP images confirms the above findings. IMPRESSION: Negative for acute pulmonary embolism.  No significant abnormality. Electronically Signed   By:  Andreas Newport M.D.   On: 07/14/2015 03:47   I have personally reviewed and evaluated these images and lab results as part of my medical decision-making.   EKG Interpretation   Date/Time:  Monday Jul 14 2015 00:17:27 EDT Ventricular Rate:  98 PR Interval:    QRS Duration: 110 QT Interval:  471 QTC Calculation: 601 R Axis:   -48 Text Interpretation:  Normal sinus rhythm Left axis deviation Borderline  abnrm T, anterolateral leads Prolonged QT interval Reconfirmed by Kamryn Gauthier   MD, Karlisha Mathena [phone]) on 07/14/2015 5:02:21 AM      MDM   Final diagnoses:  Chest pain, unspecified chest pain type    Patient presents to the ER for evaluation of chest pain. Patient has been experiencing intermittent episodes of atypical chest pain recently and was seen by cardiology 5 days ago. She was having intermittent episodes of sharp pains in the left chest that occurred for seconds at a time which prompted the evaluation by cardiology. Tonight, however, she had more typical pain. She had a heaviness over her chest that lasted for approximately 1 hour. She had associated shortness of breath. Patient underwent right total hip replacement on April 20. She was in rehabilitation until yesterday. Based on this, is at risk for PE. CT angiography was performed and is negative.  Patient is scheduled to have [place] study and echo performed on May 12. She is having more typical chest pain tonight, however. Her heart score is moderate risk:  HEART Score for Major Cardiac Events from [email or web address]  on 07/14/2015  RESULT SUMMARY: 5 points Moderate Score (4-6 points)  Risk of MACE of 12-16.6%.   INPUTS: History -> 1 = Moderately suspicious EKG -> 0 = Normal Age -> 2 = ? 65 Risk factors -> 2 = ? 3 risk factors or history of atherosclerotic disease Troponin -> 0 = ? normal limit  Based on this, will recommend admission for planned workup. I personally performed the services described in this documentation,  which was scribed in my presence. The recorded information has been reviewed and is accurate.    Orpah Greek, MD 07/14/15 [phone]

## 2015-07-14 ENCOUNTER — Encounter (HOSPITAL_COMMUNITY): Payer: Self-pay | Admitting: *Deleted

## 2015-07-14 ENCOUNTER — Emergency Department (HOSPITAL_COMMUNITY): Payer: Medicare HMO

## 2015-07-14 ENCOUNTER — Other Ambulatory Visit (HOSPITAL_COMMUNITY): Payer: Medicare HMO

## 2015-07-14 ENCOUNTER — Encounter (HOSPITAL_COMMUNITY)
Admission: EM | Disposition: A | Discharge status: Home / Self Care | Payer: Self-pay | Source: Home / Self Care | Attending: Emergency Medicine

## 2015-07-14 DIAGNOSIS — I272 Other secondary pulmonary hypertension: Principal | ICD-10-CM

## 2015-07-14 DIAGNOSIS — I2781 Cor pulmonale (chronic): Secondary | ICD-10-CM

## 2015-07-14 DIAGNOSIS — R079 Chest pain, unspecified: Secondary | ICD-10-CM | POA: Diagnosis not present

## 2015-07-14 DIAGNOSIS — I25110 Atherosclerotic heart disease of native coronary artery with unstable angina pectoris: Secondary | ICD-10-CM

## 2015-07-14 DIAGNOSIS — I10 Essential (primary) hypertension: Secondary | ICD-10-CM

## 2015-07-14 DIAGNOSIS — I480 Paroxysmal atrial fibrillation: Secondary | ICD-10-CM

## 2015-07-14 DIAGNOSIS — I2721 Secondary pulmonary arterial hypertension: Secondary | ICD-10-CM | POA: Insufficient documentation

## 2015-07-14 HISTORY — PX: CARDIAC CATHETERIZATION: SHX172

## 2015-07-14 LAB — POCT I-STAT 3, ART BLOOD GAS (G3+)
BICARBONATE: 24.1 meq/L — AB (ref 20.0–24.0)
O2 Saturation: 88 %
PCO2 ART: 37.7 mmHg (ref 35.0–45.0)
PH ART: 7.413 (ref 7.350–7.450)
PO2 ART: 55 mmHg — AB (ref 80.0–100.0)
TCO2: 25 mmol/L (ref 0–100)

## 2015-07-14 LAB — CBC WITH DIFFERENTIAL/PLATELET
Basophils Absolute: 0 10*3/uL (ref 0.0–0.1)
Basophils Relative: 0 %
EOS ABS: 0.1 10*3/uL (ref 0.0–0.7)
EOS PCT: 1 %
HCT: 30.8 % — ABNORMAL LOW (ref 36.0–46.0)
Hemoglobin: 10 g/dL — ABNORMAL LOW (ref 12.0–15.0)
LYMPHS ABS: 0.7 10*3/uL (ref 0.7–4.0)
Lymphocytes Relative: 11 %
MCH: 30.8 pg (ref 26.0–34.0)
MCHC: 32.5 g/dL (ref 30.0–36.0)
MCV: 94.8 fL (ref 78.0–100.0)
MONO ABS: 0.7 10*3/uL (ref 0.1–1.0)
Monocytes Relative: 10 %
NEUTROS PCT: 78 %
Neutro Abs: 5.2 10*3/uL (ref 1.7–7.7)
PLATELETS: 239 10*3/uL (ref 150–400)
RBC: 3.25 MIL/uL — AB (ref 3.87–5.11)
RDW: 13.3 % (ref 11.5–15.5)
WBC: 6.7 10*3/uL (ref 4.0–10.5)

## 2015-07-14 LAB — PROTIME-INR
INR: 1.26 (ref 0.00–1.49)
PROTHROMBIN TIME: 16 s — AB (ref 11.6–15.2)

## 2015-07-14 LAB — COMPREHENSIVE METABOLIC PANEL
ALT: 14 U/L (ref 14–54)
ANION GAP: 9 (ref 5–15)
AST: 15 U/L (ref 15–41)
Albumin: 3.2 g/dL — ABNORMAL LOW (ref 3.5–5.0)
Alkaline Phosphatase: 87 U/L (ref 38–126)
BUN: 15 mg/dL (ref 6–20)
CHLORIDE: 104 mmol/L (ref 101–111)
CO2: 24 mmol/L (ref 22–32)
Calcium: 9.3 mg/dL (ref 8.9–10.3)
Creatinine, Ser: 0.87 mg/dL (ref 0.44–1.00)
GFR calc non Af Amer: >60 mL/min (ref >60)
Glucose, Bld: 164 mg/dL — ABNORMAL HIGH (ref 65–99)
POTASSIUM: 4.1 mmol/L (ref 3.5–5.1)
SODIUM: 137 mmol/L (ref 135–145)
Total Bilirubin: 0.6 mg/dL (ref 0.3–1.2)
Total Protein: 5.8 g/dL — ABNORMAL LOW (ref 6.5–8.1)

## 2015-07-14 LAB — POCT I-STAT 3, VENOUS BLOOD GAS (G3P V)
Acid-base deficit: 1 mmol/L (ref 0.0–2.0)
Bicarbonate: 24.3 mEq/L — ABNORMAL HIGH (ref 20.0–24.0)
O2 SAT: 58 %
PCO2 VEN: 39.8 mmHg — AB (ref 45.0–50.0)
PO2 VEN: 30 mmHg — AB (ref 31.0–45.0)
TCO2: 26 mmol/L (ref 0–100)
pH, Ven: 7.394 — ABNORMAL HIGH (ref 7.250–7.300)

## 2015-07-14 LAB — TROPONIN I
Troponin I: <0.03 ng/mL (ref <0.031)
Troponin I: <0.03 ng/mL (ref <0.031)

## 2015-07-14 LAB — BRAIN NATRIURETIC PEPTIDE: B NATRIURETIC PEPTIDE 5: 640.5 pg/mL — AB (ref 0.0–100.0)

## 2015-07-14 SURGERY — RIGHT/LEFT HEART CATH AND CORONARY ANGIOGRAPHY

## 2015-07-14 MED ORDER — ONDANSETRON HCL 4 MG/2ML IJ SOLN: 4.0000 mg | Freq: Four times a day (QID) | INTRAMUSCULAR | Status: DC | PRN

## 2015-07-14 MED ORDER — IOPAMIDOL (ISOVUE-370) INJECTION 76%
INTRAVENOUS | Status: AC
  Administered 2015-07-14: 100 mL
  Filled 2015-07-14: qty 100

## 2015-07-14 MED ORDER — AMLODIPINE BESYLATE 5 MG PO TABS
5.0000 mg | ORAL_TABLET | Freq: Every day | ORAL | Status: DC
  Administered 2015-07-14: 5 mg via ORAL
  Filled 2015-07-14 (×2): qty 1

## 2015-07-14 MED ORDER — MIDAZOLAM HCL 2 MG/2ML IJ SOLN
INTRAMUSCULAR | Status: DC | PRN
  Administered 2015-07-14: 1 mg via INTRAVENOUS

## 2015-07-14 MED ORDER — METOPROLOL TARTRATE 25 MG PO TABS
25.0000 mg | ORAL_TABLET | Freq: Two times a day (BID) | ORAL | Status: DC
  Administered 2015-07-14 (×2): 25 mg via ORAL
  Filled 2015-07-14 (×3): qty 1

## 2015-07-14 MED ORDER — HEPARIN SODIUM (PORCINE) 1000 UNIT/ML IJ SOLN
INTRAMUSCULAR | Status: AC
  Filled 2015-07-14: qty 1

## 2015-07-14 MED ORDER — APIXABAN 5 MG PO TABS
5.0000 mg | ORAL_TABLET | Freq: Two times a day (BID) | ORAL | Status: DC
  Administered 2015-07-14 – 2015-07-15 (×2): 5 mg via ORAL
  Filled 2015-07-14 (×2): qty 1

## 2015-07-14 MED ORDER — SODIUM CHLORIDE 0.9% FLUSH
3.0000 mL | INTRAVENOUS | Status: DC | PRN
Start: 2015-07-14 — End: 2015-07-15

## 2015-07-14 MED ORDER — HEPARIN (PORCINE) IN NACL 100-0.45 UNIT/ML-% IJ SOLN
900.0000 [IU]/h | INTRAMUSCULAR | Status: DC
  Administered 2015-07-14: 900 [IU]/h via INTRAVENOUS
  Filled 2015-07-14: qty 250

## 2015-07-14 MED ORDER — ATORVASTATIN CALCIUM 40 MG PO TABS: 40.0000 mg | ORAL_TABLET | Freq: Every day | ORAL | Status: DC

## 2015-07-14 MED ORDER — FENTANYL CITRATE (PF) 100 MCG/2ML IJ SOLN
INTRAMUSCULAR | Status: DC | PRN
  Administered 2015-07-14: 25 ug via INTRAVENOUS

## 2015-07-14 MED ORDER — SODIUM CHLORIDE 0.9 % WEIGHT BASED INFUSION
3.0000 mL/kg/h | INTRAVENOUS | Status: AC
  Administered 2015-07-14: 3 mL/kg/h via INTRAVENOUS

## 2015-07-14 MED ORDER — MIDAZOLAM HCL 2 MG/2ML IJ SOLN
INTRAMUSCULAR | Status: AC
  Filled 2015-07-14: qty 2

## 2015-07-14 MED ORDER — PANTOPRAZOLE SODIUM 40 MG PO TBEC
40.0000 mg | DELAYED_RELEASE_TABLET | Freq: Every day | ORAL | Status: DC
  Administered 2015-07-14 – 2015-07-15 (×2): 40 mg via ORAL
  Filled 2015-07-14 (×2): qty 1

## 2015-07-14 MED ORDER — DESIPRAMINE HCL 25 MG PO TABS
25.0000 mg | ORAL_TABLET | Freq: Every day | ORAL | Status: DC
  Administered 2015-07-14 – 2015-07-15 (×2): 25 mg via ORAL
  Filled 2015-07-14 (×2): qty 1

## 2015-07-14 MED ORDER — HEPARIN SODIUM (PORCINE) 1000 UNIT/ML IJ SOLN
INTRAMUSCULAR | Status: DC | PRN
  Administered 2015-07-14: 4000 [IU] via INTRAVENOUS

## 2015-07-14 MED ORDER — LISINOPRIL 10 MG PO TABS
20.0000 mg | ORAL_TABLET | Freq: Every day | ORAL | Status: DC
  Administered 2015-07-14 – 2015-07-15 (×2): 20 mg via ORAL
  Filled 2015-07-14 (×2): qty 2

## 2015-07-14 MED ORDER — SODIUM CHLORIDE 0.9 % WEIGHT BASED INFUSION: 3.0000 mL/kg/h | INTRAVENOUS | Status: DC

## 2015-07-14 MED ORDER — ASPIRIN EC 325 MG PO TBEC
325.0000 mg | DELAYED_RELEASE_TABLET | Freq: Two times a day (BID) | ORAL | Status: DC
  Administered 2015-07-14 – 2015-07-15 (×3): 325 mg via ORAL
  Filled 2015-07-14 (×3): qty 1

## 2015-07-14 MED ORDER — SODIUM CHLORIDE 0.9 % WEIGHT BASED INFUSION: 1.0000 mL/kg/h | INTRAVENOUS | Status: DC

## 2015-07-14 MED ORDER — FENTANYL CITRATE (PF) 100 MCG/2ML IJ SOLN
INTRAMUSCULAR | Status: AC
  Filled 2015-07-14: qty 2

## 2015-07-14 MED ORDER — TEMAZEPAM 7.5 MG PO CAPS
7.5000 mg | ORAL_CAPSULE | Freq: Every day | ORAL | Status: DC
  Administered 2015-07-14: 7.5 mg via ORAL
  Filled 2015-07-14: qty 1

## 2015-07-14 MED ORDER — ASPIRIN 81 MG PO CHEW
81.0000 mg | CHEWABLE_TABLET | ORAL | Status: AC
  Administered 2015-07-14: 81 mg via ORAL
  Filled 2015-07-14: qty 1

## 2015-07-14 MED ORDER — HEPARIN BOLUS VIA INFUSION
3000.0000 [IU] | Freq: Once | INTRAVENOUS | Status: AC
  Administered 2015-07-14: 3000 [IU] via INTRAVENOUS
  Filled 2015-07-14: qty 3000

## 2015-07-14 MED ORDER — ACETAMINOPHEN 325 MG PO TABS: 650.0000 mg | ORAL_TABLET | ORAL | Status: DC | PRN

## 2015-07-14 MED ORDER — SODIUM CHLORIDE 0.9% FLUSH
3.0000 mL | Freq: Two times a day (BID) | INTRAVENOUS | Status: DC
  Administered 2015-07-14: 3 mL via INTRAVENOUS

## 2015-07-14 MED ORDER — BACLOFEN 10 MG PO TABS
10.0000 mg | ORAL_TABLET | Freq: Three times a day (TID) | ORAL | Status: DC
  Administered 2015-07-14: 10 mg via ORAL
  Filled 2015-07-14 (×5): qty 1

## 2015-07-14 MED ORDER — ACETAMINOPHEN ER 650 MG PO TBCR: 650.0000 mg | EXTENDED_RELEASE_TABLET | Freq: Three times a day (TID) | ORAL | Status: DC | PRN

## 2015-07-14 MED ORDER — IOPAMIDOL (ISOVUE-370) INJECTION 76%
INTRAVENOUS | Status: DC | PRN
  Administered 2015-07-14: 55 mL via INTRA_ARTERIAL

## 2015-07-14 MED ORDER — HEPARIN (PORCINE) IN NACL 2-0.9 UNIT/ML-% IJ SOLN
INTRAMUSCULAR | Status: AC
  Filled 2015-07-14: qty 1000

## 2015-07-14 MED ORDER — VERAPAMIL HCL 2.5 MG/ML IV SOLN
INTRAVENOUS | Status: AC
  Filled 2015-07-14: qty 2

## 2015-07-14 MED ORDER — LIDOCAINE HCL (PF) 1 % IJ SOLN
INTRAMUSCULAR | Status: DC | PRN
  Administered 2015-07-14 (×2): 1 mL

## 2015-07-14 MED ORDER — SODIUM CHLORIDE 0.9% FLUSH: 3.0000 mL | INTRAVENOUS | Status: DC | PRN

## 2015-07-14 MED ORDER — SODIUM CHLORIDE 0.9 % WEIGHT BASED INFUSION: 3.0000 mL/kg/h | INTRAVENOUS | Status: AC

## 2015-07-14 MED ORDER — IOPAMIDOL (ISOVUE-370) INJECTION 76%
INTRAVENOUS | Status: AC
  Filled 2015-07-14: qty 100

## 2015-07-14 MED ORDER — SODIUM CHLORIDE 0.9 % IV SOLN: 250.0000 mL | INTRAVENOUS | Status: DC | PRN

## 2015-07-14 MED ORDER — LINACLOTIDE 145 MCG PO CAPS
145.0000 ug | ORAL_CAPSULE | Freq: Every day | ORAL | Status: DC
  Administered 2015-07-14 – 2015-07-15 (×2): 145 ug via ORAL
  Filled 2015-07-14 (×2): qty 1

## 2015-07-14 MED ORDER — SODIUM CHLORIDE 0.9% FLUSH
3.0000 mL | Freq: Two times a day (BID) | INTRAVENOUS | Status: DC
  Administered 2015-07-14 – 2015-07-15 (×2): 3 mL via INTRAVENOUS

## 2015-07-14 MED ORDER — ATORVASTATIN CALCIUM 40 MG PO TABS
40.0000 mg | ORAL_TABLET | Freq: Every day | ORAL | Status: DC
  Administered 2015-07-14: 40 mg via ORAL
  Filled 2015-07-14: qty 1

## 2015-07-14 MED ORDER — HEPARIN (PORCINE) IN NACL 2-0.9 UNIT/ML-% IJ SOLN
INTRAMUSCULAR | Status: DC | PRN
  Administered 2015-07-14: 1500 mL

## 2015-07-14 MED ORDER — POLYETHYLENE GLYCOL 3350 17 G PO PACK
17.0000 g | PACK | Freq: Two times a day (BID) | ORAL | Status: DC
  Administered 2015-07-14 (×2): 17 g via ORAL
  Filled 2015-07-14 (×3): qty 1

## 2015-07-14 MED ORDER — LIDOCAINE HCL (PF) 1 % IJ SOLN
INTRAMUSCULAR | Status: AC
  Filled 2015-07-14: qty 30

## 2015-07-14 MED ORDER — VERAPAMIL HCL 2.5 MG/ML IV SOLN
INTRAVENOUS | Status: DC | PRN
  Administered 2015-07-14: 3 mL via INTRA_ARTERIAL

## 2015-07-14 MED ORDER — ENOXAPARIN SODIUM 40 MG/0.4ML ~~LOC~~ SOLN
40.0000 mg | SUBCUTANEOUS | Status: DC
Start: 2015-07-14 — End: 2015-07-14
  Administered 2015-07-14: 40 mg via SUBCUTANEOUS
  Filled 2015-07-14: qty 0.4

## 2015-07-14 SURGICAL SUPPLY — 12 items
CATH BALLN WEDGE 5F 110CM (CATHETERS) ×2 IMPLANT
CATH OPTITORQUE TIG 4.0 5F (CATHETERS) ×2 IMPLANT
DEVICE RAD COMP TR BAND LRG (VASCULAR PRODUCTS) ×2 IMPLANT
GLIDESHEATH SLEND A-KIT 6F 22G (SHEATH) ×2 IMPLANT
GLIDESHEATH SLEND SS 6F .021 (SHEATH) ×2 IMPLANT
KIT HEART LEFT (KITS) ×2 IMPLANT
PACK CARDIAC CATHETERIZATION (CUSTOM PROCEDURE TRAY) ×2 IMPLANT
SHEATH FAST CATH BRACH 5F 5CM (SHEATH) ×2 IMPLANT
TRANSDUCER W/STOPCOCK (MISCELLANEOUS) ×2 IMPLANT
TUBING CIL FLEX 10 FLL-RA (TUBING) ×2 IMPLANT
WIRE EMERALD 3MM-J .025X260CM (WIRE) ×2 IMPLANT
WIRE SAFE-T 1.5MM-J .035X260CM (WIRE) ×2 IMPLANT

## 2015-07-14 NOTE — ED Notes (Signed)
Patient ambulated to restroom using home walker for assistance with ambulation, accompanied by family member. No distress noted. Denies pain at this time.

## 2015-07-14 NOTE — H&P (Signed)
History and Physical    Melinda [name] WCH:[id number] DOB: 10-16-1931 DOA: 07/13/2015  Referring MD/NP/PA: Dr. Betsey Holiday PCP: Jerlyn Ly, MD Outpatient Specialists: Dr. Martinique (cards) Patient coming from: Home  Chief Complaint: Chest pain  HPI: Melinda [name] is a 82 y.o. female with medical history significant of HTN, GERD, recently had hip replacement surgery on 4/20.  Patient saw Dr. Martinique on 5/2 for a one year history of DOE and intermittent sharp left sided chest pain.  He scheduled the patient for stress test on 5/12.  Tonight earlier in the evening she developed crushing L sided chest pain that was very different from previous symptoms, associated with SOB.  NTG made it better.  Activity makes it worse.  Patient presents to ED.    ED Course: CTA ruled out PE in light of recent surgery.  Trop negative.  On EKG there does appear to be slight ST segment depressions in lateral leads that wasn't there on 4/18.  Review of Systems: As per HPI otherwise 10 point review of systems negative.    Past Medical History  Diagnosis Date  . Hypercholesterolemia   . Acid reflux   . Obstructive sleep apnea   . Depression   . Insomnia   . Premature atrial contractions   . Anxiety   . Hypertension   . Cataract     surgery,bilateral  . Pyloric antral stenosis     in childhood,infancy  . Multifactorial gait disorder   . Sleep apnea with use of continuous positive airway pressure (CPAP) 10/19/2012  . Diabetes mellitus without complication ([place])   . Shortness of breath dyspnea     with exertion  . Hx: UTI (urinary tract infection)   . Arthritis   . Constipation   . Family history of adverse reaction to anesthesia     mother always had n/v    Past Surgical History  Procedure Laterality Date  . Abdominal hysterectomy  1970  . Transthoracic echocardiogram  07/23/2009    mild concentric LVH/mild mitral insufficiency/ moderate tricuspid innsufficiency/mild pulmonary HTN/EF- 55-60%    . Nm myoview ltd  11/30/2007    normal stress nuclear study/EF-83%  . Vaginal delivery    . Inner ear surgery Left     x 3  . Colonoscopy    . Laparoscopic salpingo oopherectomy    . Tonsillectomy    . Abdominal surgery      as a 46 day old baby  . Eye surgery Bilateral     cataract surgery with lens implant  . Total hip arthroplasty Right 06/26/2015    Procedure: RIGHT TOTAL HIP ARTHROPLASTY ANTERIOR APPROACH;  Surgeon: Leandrew Koyanagi, MD;  Location: [place] [facility];  Service: Orthopedics;  Laterality: Right;     reports that she has never smoked. She has never used smokeless tobacco. She reports that she does not drink alcohol or use illicit drugs.  Allergies  Allergen Reactions  . Phenobarbital Itching    Family History  Problem Relation Age of Onset  . Heart attack Father   . Heart failure Mother   . Cancer Mother     colon,kidney  . Diabetes Sister   . Hypertension Sister   . Heart failure Sister      Prior to Admission medications   Medication Sig Start Date End Date Taking? Authorizing Provider  acetaminophen (TYLENOL) 650 MG CR tablet Take 650 mg by mouth every 8 (eight) hours as needed for pain.   Yes Historical Provider, MD  amLODipine (NORVASC)  5 MG tablet Take 5 mg by mouth daily.   Yes Historical Provider, MD  aspirin EC 325 MG tablet Take 1 tablet (325 mg total) by mouth 2 (two) times daily. 06/26/15  Yes Leandrew Koyanagi, MD  atorvastatin (LIPITOR) 40 MG tablet Take 40 mg by mouth daily. One tablet in am   Yes Historical Provider, MD  baclofen (LIORESAL) 10 MG tablet Take 10 mg by mouth 3 (three) times daily.   Yes Historical Provider, MD  Calcium Carbonate-Vitamin D3 (CALCIUM 600+D3) 600-400 MG-UNIT TABS Take 1 tablet by mouth daily.   Yes Historical Provider, MD  cholecalciferol (VITAMIN D) 1000 UNITS tablet Take 1,000 Units by mouth daily. D3   Yes Historical Provider, MD  desipramine (NORPRAMIN) 25 MG tablet Take 25 mg by mouth daily.   Yes Historical Provider, MD   glucosamine-chondroitin 500-400 MG tablet Take 1 tablet by mouth every other day.    Yes Historical Provider, MD  linaclotide (LINZESS) 145 MCG CAPS capsule Take 145 mcg by mouth daily before breakfast.    Yes Historical Provider, MD  lisinopril (PRINIVIL,ZESTRIL) 20 MG tablet Take 20 mg by mouth daily.   Yes Historical Provider, MD  metoprolol (LOPRESSOR) 50 MG tablet Take 25 mg by mouth 2 (two) times daily. Take 1/2 tablet by mouth twice daily   Yes Historical Provider, MD  Omega-3 Fatty Acids (FISH OIL) 1200 MG CAPS Take 1 capsule by mouth daily.    Yes Historical Provider, MD  omeprazole (PRILOSEC) 20 MG capsule Take 20 mg by mouth daily. One tablet daily in mornings   Yes Historical Provider, MD  polyethylene glycol (MIRALAX / GLYCOLAX) packet Take 17 g by mouth 2 (two) times daily.   Yes Historical Provider, MD  Protein (PROCEL 100) POWD Take 1 scoop by mouth 2 (two) times daily.   Yes Historical Provider, MD  temazepam (RESTORIL) 7.5 MG capsule Take 7.5 mg by mouth at bedtime.   Yes Historical Provider, MD  vitamin B-12 (CYANOCOBALAMIN) 500 MCG tablet Take 1,000 mcg by mouth daily.    Yes Historical Provider, MD  Blood Glucose Monitoring Suppl (ONE TOUCH ULTRA 2) W/DEVICE KIT Reported on 07/11/2015 06/21/13   Historical Provider, MD  glucose blood (ACCU-CHEK ACTIVE STRIPS) test strip 1 each by Other route as needed for other. Reported on 07/11/2015    Historical Provider, MD  Jonetta Speak LANCETS 57D MISC Reported on 07/11/2015 06/21/13   Historical Provider, MD    Physical Exam: Filed Vitals:   07/14/15 0430 07/14/15 0445 07/14/15 0500 07/14/15 0515  BP: 117/59 110/70 121/71 137/88  Pulse: 109 95 93 104  Resp: '28 23 16 17  ' SpO2: 95% 95% 94% 92%      Constitutional: NAD, calm, comfortable Filed Vitals:   07/14/15 0430 07/14/15 0445 07/14/15 0500 07/14/15 0515  BP: 117/59 110/70 121/71 137/88  Pulse: 109 95 93 104  Resp: '28 23 16 17  ' SpO2: 95% 95% 94% 92%   Eyes: PERRL, lids and  conjunctivae normal ENMT: Mucous membranes are moist. Posterior pharynx clear of any exudate or lesions.Normal dentition.  Neck: normal, supple, no masses, no thyromegaly Respiratory: clear to auscultation bilaterally, no wheezing, no crackles. Normal respiratory effort. No accessory muscle use.  Cardiovascular: Regular rate and rhythm, no murmurs / rubs / gallops. No extremity edema. 2+ pedal pulses. No carotid bruits.  Abdomen: no tenderness, no masses palpated. No hepatosplenomegaly. Bowel sounds positive.  Musculoskeletal: no clubbing / cyanosis. No joint deformity upper and lower extremities. Good ROM, no  contractures. Normal muscle tone.  Skin: no rashes, lesions, ulcers. No induration Neurologic: CN 2-12 grossly intact. Sensation intact, DTR normal. Strength 5/5 in all 4.  Psychiatric: Normal judgment and insight. Alert and oriented x 3. Normal mood.    Labs on Admission: I have personally reviewed following labs and imaging studies  CBC:  Recent Labs Lab 07/14/15 0019  WBC 6.7  NEUTROABS 5.2  HGB 10.0*  HCT 30.8*  MCV 94.8  PLT 706   Basic Metabolic Panel:  Recent Labs Lab 07/14/15 0019  NA 137  K 4.1  CL 104  CO2 24  GLUCOSE 164*  BUN 15  CREATININE 0.87  CALCIUM 9.3   GFR: Estimated Creatinine Clearance: 47.5 mL/min (by C-G formula based on Cr of 0.87). Liver Function Tests:  Recent Labs Lab 07/14/15 0019  AST 15  ALT 14  ALKPHOS 87  BILITOT 0.6  PROT 5.8*  ALBUMIN 3.2*   No results for input(s): LIPASE, AMYLASE in the last 168 hours. No results for input(s): AMMONIA in the last 168 hours. Coagulation Profile: No results for input(s): INR, PROTIME in the last 168 hours. Cardiac Enzymes:  Recent Labs Lab 07/14/15 0019  TROPONINI <0.03   BNP (last 3 results) No results for input(s): PROBNP in the last 8760 hours. HbA1C: No results for input(s): HGBA1C in the last 72 hours. CBG: No results for input(s): GLUCAP in the last 168 hours. Lipid  Profile: No results for input(s): CHOL, HDL, LDLCALC, TRIG, CHOLHDL, LDLDIRECT in the last 72 hours. Thyroid Function Tests: No results for input(s): TSH, T4TOTAL, FREET4, T3FREE, THYROIDAB in the last 72 hours. Anemia Panel: No results for input(s): VITAMINB12, FOLATE, FERRITIN, TIBC, IRON, RETICCTPCT in the last 72 hours. Urine analysis:    Component Value Date/Time   COLORURINE YELLOW 06/24/2015 [place] 06/24/2015 1308   LABSPEC 1.010 06/24/2015 1308   PHURINE 7.0 06/24/2015 1308   GLUCOSEU NEGATIVE 06/24/2015 1308   HGBUR NEGATIVE 06/24/2015 1308   [place] 06/24/2015 1308   [place] 06/24/2015 1308   PROTEINUR NEGATIVE 06/24/2015 1308   NITRITE NEGATIVE 06/24/2015 1308   LEUKOCYTESUR MODERATE* 06/24/2015 1308   Sepsis Labs: '@LABRCNTIP' (procalcitonin:4,lacticidven:4) )No results found for this or any previous visit (from the past 240 hour(s)).   Radiological Exams on Admission: Dg Chest 2 View  07/14/2015  CLINICAL DATA:  Lower mid chest pain earlier tonight. EXAM: CHEST  2 VIEW COMPARISON:  07/13/2004 FINDINGS: The lungs are clear. The pulmonary vasculature is normal. Heart size is normal. Hilar and mediastinal contours are unremarkable. There is no pleural effusion. IMPRESSION: No active cardiopulmonary disease. Electronically Signed   By: Andreas Newport M.D.   On: 07/14/2015 01:21   Ct Angio Chest Pe W/cm &/or Wo Cm  07/14/2015  CLINICAL DATA:  Chest pain and dyspnea. EXAM: CT ANGIOGRAPHY CHEST WITH CONTRAST TECHNIQUE: Multidetector CT imaging of the chest was performed using the standard protocol during bolus administration of intravenous contrast. Multiplanar CT image reconstructions and MIPs were obtained to evaluate the vascular anatomy. CONTRAST:  100 mL Isovue 370 intravenous COMPARISON:  Radiographs 07/14/2015 FINDINGS: Cardiovascular: There is good opacification of the pulmonary arteries. There is no pulmonary embolism. The thoracic  aorta is normal in caliber and intact. Lungs: Clear Central airways: Beta Effusions: None Lymphadenopathy: None Esophagus: Unremarkable Upper abdomen: Unremarkable Musculoskeletal: No significant abnormality Review of the MIP images confirms the above findings. IMPRESSION: Negative for acute pulmonary embolism.  No significant abnormality. Electronically Signed   By: Shaune Pascal  Alroy Dust M.D.   On: 07/14/2015 03:47    EKG: Independently reviewed.  Slight ST segment depressions in lateral leads that dont appear to have been there on 4/18.  Patient is in sinus rhythm not A.Fib  Assessment/Plan Principal Problem:   Chest pain, moderate coronary artery risk Active Problems:   Essential hypertension    Chest pain, mod coronary risk -  Ruled out for PE based on negative CTA chest.  Serial trops  NPO  Likely warrants cards consult / eval in AM  HTN - continue home meds   DVT prophylaxis: Lovenox Code Status: Full Family Communication: No family in room Consults called: None Admission status: Admit to obs   [place], [facility] Pager [phone] from 7PM-7AM  If 7AM-7PM, please contact the day physician for the patient [email or web address] Password [email or web address]  07/14/2015, 5:32 AM

## 2015-07-14 NOTE — Progress Notes (Addendum)
Patient ID: [name], female   DOB: 09/20/1931, 82 y.o.   MRN: [id number]  82 y.o. female with medical history significant for HTN, GERD, recently hip replacement surgery on 4/20. Patient saw Dr. Martinique on 5/2 for a one year history of DOE and intermittent sharp left sided chest pain.Pt is scheduled for stress test on 5/12.The night prior to the admission patient developed crushing left sided chest pain, different from previous symptoms, associated with shortness of breath.Pain was better with nitroglycerin. Pt was hemodynamically stale. The 12 lead EKG showed slight ST segment depressions in lateral leads that wasn't there on 4/18. CXR showed no acute cardiopulmonary findings. CT angio chest did not show PE. The troponin levels were WNL. On physical exam, trace lower extremity pitting edema.  Assessment and plan:  Chest pain, left sided - The trop level x 2 negative - The 12 lead EKG showed slight ST segment depressions in lateral leads that wasn't there on 4/18 - Appreciate cardiology consult and recommendations - Was scheduled for stress test 5/12, may possibly be done on this admission  Leisa Lenz [place]

## 2015-07-14 NOTE — Progress Notes (Signed)
ANTICOAGULATION CONSULT NOTE - Initial Consult  Pharmacy Consult for heparin Indication: atrial fibrillation  Allergies  Allergen Reactions  . Phenobarbital Itching    Patient Measurements:   Heparin Dosing Weight: 65kg  Vital Signs: Temp: 97.7 F (36.5 C) (05/08 0623) Temp Source: Oral (05/08 0623) BP: 133/81 mmHg (05/08 0623) Pulse Rate: 100 (05/08 0623)  Labs:  Recent Labs  07/14/15 0019 07/14/15 0556 07/14/15 1014  HGB 10.0*  --   --   HCT 30.8*  --   --   PLT 239  --   --   CREATININE 0.87  --   --   TROPONINI <0.03 <0.03 <0.03    Estimated Creatinine Clearance: 47.5 mL/min (by C-G formula based on Cr of 0.87).  Assessment: 23 YOF with no previous cardiac history who presents with chest pain. Found to be in AFib, CHADSVASC = 5. Plans for cath as heavy calcifications in coronary arteries seen on CTA.  She was on prophylactic Lovenox- dose was given this morning at ~0830.  Baseline Hgb 10, plts 239- no bleeding noted.  Goal of Therapy:  Heparin level 0.3-0.7 units/ml Monitor platelets by anticoagulation protocol: Yes   Plan:  -heparin bolus with 3000 units IV x1, then start infusion at 900 units/hr -heparin level in 8h -daily HL and CBC -follow for long term AC plans post-cath  Kyrstyn Greear D. Shanaye Rief, PharmD, BCPS Clinical Pharmacist Pager: [phone] 07/14/2015 11:41 AM

## 2015-07-14 NOTE — Op Note (Signed)
CARDIAC CATHETERIZATION REPORT   Procedures performed:  1. Right  and Left heart catheterization  2. Selective coronary angiography  3. Left ventriculography   Reason for procedure:  Unexplained dyspnea and chest pain Heavy coronary calcification on CT chest Pulmonary artery hypertension  Procedure performed by: Sanda Klein, MD, [facility]  Complications: none   Estimated blood loss: less than 5 mL   History:  Melinda [name] is a 82 y.o. female with medical history significant of HTN, GERD, recently had hip replacement surgery on 4/20. Patient saw Dr. Martinique on 5/2 for a one year history of DOE and intermittent sharp left sided chest pain. He scheduled the patient for stress test on 5/12. Tonight earlier in the evening she developed crushing L sided chest pain that was very different from previous symptoms, associated with SOB. NTG made it better. Activity makes it worse.CTA ruled out PE in light of recent surgery.  Consent: The risks, benefits, and details of the procedure were explained to the patient. Risks including death, MI, stroke, bleeding, limb ischemia, renal failure and allergy were described and accepted by the patient. Informed written consent was obtained prior to proceeding.  Technique: The patient was brought to the cardiac catheterization laboratory in the fasting state. She was prepped and draped in the usual sterile fashion. Local anesthesia with 1% lidocaine was administered to the left antecubital area and the antecubital IV was exchanged for a short 375F brachial venous sheath. Right heart catheterization was performed with a 375F balloon tipped Swan-Ganz catheter, under fluoroscopic guidance. Local anesthesia with 1% lidocaine was administered to the right wrist area. Using the modified Seldinger technique a 5 French right radial artery sheath was introduced without difficulty. Under fluoroscopic guidance, using a 5 [place] TIG catheters, selective cannulation of the  left coronary artery, right coronary artery and left ventricle were respectively performed. Several coronary angiograms in a variety of projections were recorded. Left ventricular pressure and a pull back to the aorta were recorded. No immediate complications occurred. At the end of the procedure, all catheters were removed. After the procedure, hemostasis will be achieved with manual pressure.  Medications: verapamil 3 mg IA  During this procedure the patient was administered a total of Versed 1 mg and Fentanyl 25 mcg to achieve and maintain moderate conscious sedation.  The patient's heart rate, blood pressure, and oxygen saturation were monitored continuously during the procedure. The period of conscious sedation was 36 minutes, of which I was present face-to-face 100% of this time.  Contrast used: 55 mL Omnipaque    Hemodynamic findings:  Aortic pressure 102/63 (mean 82) mm Hg   Left ventricle [id number] with end-diastolic pressure of 18 mm Hg  PA wedge pressure a wave absent, v wave 20 (mean 17) mm Hg  Pulmonary artery 42/19 (mean 31) mm Hg  Right ventricle 37/4 with an end-diastolic pressure of 9 mm Hg  Right atrium a wave absent, v wave 20 (mean 17) mm Hg  Cardiac output is 5.8 L per minute (cardiac index 3.26 L per minute per meter sq)  Angiographic Findings:  1. The left main coronary artery is heavily calcified and has significant atherosclerosis, but nn more than a 30-40% distal stenosis. It bifurcates in the usual fashion into the left anterior descending artery and left circumflex coronary artery.  2. The left anterior descending artery is a large vessel that reaches the apex and generates 2 major diagonal branches. There is evidence of mild luminal irregularities and moderate calcification. No hemodynamically meaningful  stenoses are seen. 3. The left circumflex coronary artery is a medium-size vessel non dominant vessel that generates 2 major oblique marginal arteries. There is  evidence of mild luminal irregularities and mild calcification. No hemodynamically meaningful stenoses are seen. 4. The right coronary artery is a large-size dominant vessel that generates a two large posterior lateral ventricular branches as well as a bifurcated "twin" posterior descending artery system. There is evidence of mild luminal irregularities and moderate calcification. There is a proximal 30% stenosis, but no hemodynamically meaningful stenoses are seen.  5. The left ventricle is not injected. The ascending aorta appears normal. There is no aortic valve stenosis by pullback. The left ventricular end-diastolic pressure is [id number] mm Hg.    IMPRESSIONS:  Mild coronary atherosclerosis, not hemodynamically significant. Minimally elevated LVEDP at rest, consistent with mild diastolic dysfunction. Mild pulmonary artery hypertension. Transpulmonary gradient >12 mm Hg suggests intrinsic pulmonary arteriolar hypertension, consistent with a pulmonary cause for PAH and dyspnea.   RECOMMENDATION:  Check pulmonary function tests and reevaluate CPAP treatment settings for OSA. Symptoms appear to be non-cardiac in etiology. Statin therapy is probably appropriate. Echocardiogram should be performed, but would be more informative when she is in NSR. Start direct oral anticoagulant 4 hours after cath if no radial site bleeding.     Sanda Klein, MD, [facility] [phone] office [phone] pager

## 2015-07-14 NOTE — Consult Note (Signed)
CARDIOLOGY CONSULT NOTE   Patient ID: Melinda [name] MRN: [id number] DOB/AGE: 1931/08/23 82 y.o.  Admit date: 07/13/2015  Requesting Physician: Dr. Charlies Silvers Primary Physician:   Jerlyn Ly, MD Primary Cardiologist:  Dr. Martinique Reason for Consultation: chest pain  HPI: Melinda [name] is a 82 y.o. female with a history of obesity, OSA on CPAP, HLD, HTN, DMT2 and no previous cardiac history who presented to [facility] on 07/13/15 with chest pain.   She saw Dr. Martinique in 2009. At that time she had a normal stress Myoview study. Echo showed mild LVH with normal EF. Mild pulmonary HTN.   She recently had hip replacement surgery on 06/26/15. She has been walking with a walker after surgery.   She was seen by Dr. Martinique recently on 07/08/15 for DOE and atypical chest pain. 2D ECHO and nuclear stress test was ordered; however, have not been completed yet.   She presented to the ER on 07/13/15 with crushing left chest pain. CTA ruled out PE in light of recent surgery. Trop negative.On EKG felt to have slight ST segment depressions in lateral leads that wasn't there on 4/18 and cardiology was consulted.   Saturday night she was sitting on the side of her bed getting ready for bed when she had significant left sided chest pressure with radiation into her back. It felt like she had an "elephant sitting on her chest." She took her BP which was noted to be elevated. Also, HR was noted to be up and down and >100. She called EMS and was given 3 SL NTG with complete relief. No chest pressure since. She does report a significant history of DOE but no chest pressure with exertion. No LE edema, orthopnea or PND. She has had some slight swelling in right leg from recent hip surgery. No dizziness or syncope. No palpitations.   Her daughter had her first stent in her 58s.  Past Medical History  Diagnosis Date  . Hypercholesterolemia   . Acid reflux   . Obstructive sleep apnea   . Depression   .  Insomnia   . Premature atrial contractions   . Anxiety   . Hypertension   . Cataract     surgery,bilateral  . Pyloric antral stenosis     in childhood,infancy  . Multifactorial gait disorder   . Sleep apnea with use of continuous positive airway pressure (CPAP) 10/19/2012  . Diabetes mellitus without complication ([place])   . Shortness of breath dyspnea     with exertion  . Hx: UTI (urinary tract infection)   . Arthritis   . Constipation   . Family history of adverse reaction to anesthesia     mother always had n/v     Past Surgical History  Procedure Laterality Date  . Abdominal hysterectomy  1970  . Transthoracic echocardiogram  07/23/2009    mild concentric LVH/mild mitral insufficiency/ moderate tricuspid innsufficiency/mild pulmonary HTN/EF- 55-60%  . Nm myoview ltd  11/30/2007    normal stress nuclear study/EF-83%  . Vaginal delivery    . Inner ear surgery Left     x 3  . Colonoscopy    . Laparoscopic salpingo oopherectomy    . Tonsillectomy    . Abdominal surgery      as a 93 day old baby  . Eye surgery Bilateral     cataract surgery with lens implant  . Total hip arthroplasty Right 06/26/2015    Procedure: RIGHT TOTAL HIP ARTHROPLASTY ANTERIOR APPROACH;  Surgeon: Leandrew Koyanagi, MD;  Location: [facility];  Service: Orthopedics;  Laterality: Right;    Allergies  Allergen Reactions  . Phenobarbital Itching    I have reviewed the patient's current medications . amLODipine  5 mg Oral Daily  . aspirin EC  325 mg Oral BID  . atorvastatin  40 mg Oral q1800  . baclofen  10 mg Oral TID  . desipramine  25 mg Oral Daily  . enoxaparin (LOVENOX) injection  40 mg Subcutaneous Q24H  . linaclotide  145 mcg Oral QAC breakfast  . lisinopril  20 mg Oral Daily  . metoprolol  25 mg Oral BID  . pantoprazole  40 mg Oral Daily  . polyethylene glycol  17 g Oral BID  . temazepam  7.5 mg Oral QHS     acetaminophen, ondansetron (ZOFRAN) IV  Prior to Admission medications   Medication Sig  Start Date End Date Taking? Authorizing Provider  acetaminophen (TYLENOL) 650 MG CR tablet Take 650 mg by mouth every 8 (eight) hours as needed for pain.   Yes Historical Provider, MD  amLODipine (NORVASC) 5 MG tablet Take 5 mg by mouth daily.   Yes Historical Provider, MD  aspirin EC 325 MG tablet Take 1 tablet (325 mg total) by mouth 2 (two) times daily. 06/26/15  Yes Leandrew Koyanagi, MD  atorvastatin (LIPITOR) 40 MG tablet Take 40 mg by mouth daily. One tablet in am   Yes Historical Provider, MD  Calcium Carbonate-Vitamin D3 (CALCIUM 600+D3) 600-400 MG-UNIT TABS Take 1 tablet by mouth daily.   Yes Historical Provider, MD  cholecalciferol (VITAMIN D) 1000 UNITS tablet Take 1,000 Units by mouth daily. D3   Yes Historical Provider, MD  desipramine (NORPRAMIN) 25 MG tablet Take 25 mg by mouth daily.   Yes Historical Provider, MD  glucosamine-chondroitin 500-400 MG tablet Take 1 tablet by mouth every other day.    Yes Historical Provider, MD  linaclotide (LINZESS) 145 MCG CAPS capsule Take 145 mcg by mouth daily before breakfast.    Yes Historical Provider, MD  lisinopril (PRINIVIL,ZESTRIL) 20 MG tablet Take 20 mg by mouth daily.   Yes Historical Provider, MD  metoprolol (LOPRESSOR) 50 MG tablet Take 25 mg by mouth 2 (two) times daily. Take 1/2 tablet by mouth twice daily   Yes Historical Provider, MD  Omega-3 Fatty Acids (FISH OIL) 1200 MG CAPS Take 1 capsule by mouth daily.    Yes Historical Provider, MD  omeprazole (PRILOSEC) 20 MG capsule Take 20 mg by mouth daily. One tablet daily in mornings   Yes Historical Provider, MD  polyethylene glycol (MIRALAX / GLYCOLAX) packet Take 17 g by mouth 2 (two) times daily.   Yes Historical Provider, MD  Protein (PROCEL 100) POWD Take 1 scoop by mouth 2 (two) times daily.   Yes Historical Provider, MD  temazepam (RESTORIL) 7.5 MG capsule Take 7.5 mg by mouth at bedtime.   Yes Historical Provider, MD  vitamin B-12 (CYANOCOBALAMIN) 500 MCG tablet Take 1,000 mcg by  mouth daily.    Yes Historical Provider, MD  baclofen (LIORESAL) 10 MG tablet TAKE 1 TABLET BY MOUTH THREE TIMES DAILY AS NEEDED 07/14/15   Monina C Medina-Vargas, NP  Blood Glucose Monitoring Suppl (ONE TOUCH ULTRA 2) W/DEVICE KIT Reported on 07/11/2015 06/21/13   Historical Provider, MD  glucose blood (ACCU-CHEK ACTIVE STRIPS) test strip 1 each by Other route as needed for other. Reported on 07/11/2015    Historical Provider, MD  [facility] DELICA LANCETS 34H  MISC Reported on 07/11/2015 06/21/13   Historical Provider, MD     Social History   Social History  . Marital Status: Married    Spouse Name: N/A  . Number of Children: 1  . Years of Education: 13   Occupational History  . Retired    Social History Main Topics  . Smoking status: Never Smoker   . Smokeless tobacco: Never Used  . Alcohol Use: No     Comment: rare  . Drug Use: No  . Sexual Activity: Not on file   Other Topics Concern  . Not on file   Social History Narrative   Patient lives alone.    Patient is widowed.    Patient retired.    Patient some college.    Patient has one child.    Caffeine 1-2 cups daily avg.    Family Status  Relation Status Death Age  . Father Deceased 39    MI  . Mother Deceased     CHF  . Sister Alive     Palpitations  . Sister Alive    Family History  Problem Relation Age of Onset  . Heart attack Father   . Heart failure Mother   . Cancer Mother     colon,kidney  . Diabetes Sister   . Hypertension Sister   . Heart failure Sister      ROS:  Full 14 point review of systems complete and found to be negative unless listed above.  Physical Exam: Blood pressure 133/81, pulse 100, temperature 97.7 F (36.5 C), temperature source Oral, resp. rate 18, SpO2 98 %.  General: Well developed, well nourished, female in no acute distress obese Head: Eyes PERRLA, No xanthomas.   Normocephalic and atraumatic, oropharynx without edema or exudate.  Lungs: CTAB Heart: HRRR S1 S2, no rub/gallop,  Heart irregular rate and rhythm with S1, S2  murmur. pulses are 2+ extrem.   Neck: No carotid bruits. No lymphadenopathy. No JVD. Abdomen: Bowel sounds present, abdomen soft and non-tender without masses or hernias noted. Msk:  No spine or cva tenderness. No weakness, no joint deformities or effusions. Extremities: No clubbing or cyanosis. No edema in LLE. Mild edema in RLE Neuro: Alert and oriented X 3. No focal deficits noted. Psych:  Good affect, responds appropriately Skin: No rashes or lesions noted.  Labs:   Lab Results  Component Value Date   WBC 6.7 07/14/2015   HGB 10.0* 07/14/2015   HCT 30.8* 07/14/2015   MCV 94.8 07/14/2015   PLT 239 07/14/2015   No results for input(s): INR in the last 72 hours.  Recent Labs Lab 07/14/15 0019  NA 137  K 4.1  CL 104  CO2 24  BUN 15  CREATININE 0.87  CALCIUM 9.3  PROT 5.8*  BILITOT 0.6  ALKPHOS 87  ALT 14  AST 15  GLUCOSE 164*  ALBUMIN 3.2*   No results found for: MG  Recent Labs  07/14/15 0019 07/14/15 0556  TROPONINI <0.03 <0.03    Echo: pending  ECG:  NSR with LAD non specific ST and TW abnormalities  Radiology:  Dg Chest 2 View  07/14/2015  CLINICAL DATA:  Lower mid chest pain earlier tonight. EXAM: CHEST  2 VIEW COMPARISON:  07/13/2004 FINDINGS: The lungs are clear. The pulmonary vasculature is normal. Heart size is normal. Hilar and mediastinal contours are unremarkable. There is no pleural effusion. IMPRESSION: No active cardiopulmonary disease. Electronically Signed   By: Andreas Newport M.D.   On:  07/14/2015 01:21   Ct Angio Chest Pe W/cm &/or Wo Cm  07/14/2015  CLINICAL DATA:  Chest pain and dyspnea. EXAM: CT ANGIOGRAPHY CHEST WITH CONTRAST TECHNIQUE: Multidetector CT imaging of the chest was performed using the standard protocol during bolus administration of intravenous contrast. Multiplanar CT image reconstructions and MIPs were obtained to evaluate the vascular anatomy. CONTRAST:  100 mL Isovue 370  intravenous COMPARISON:  Radiographs 07/14/2015 FINDINGS: Cardiovascular: There is good opacification of the pulmonary arteries. There is no pulmonary embolism. The thoracic aorta is normal in caliber and intact. Lungs: Clear Central airways: Beta Effusions: None Lymphadenopathy: None Esophagus: Unremarkable Upper abdomen: Unremarkable Musculoskeletal: No significant abnormality Review of the MIP images confirms the above findings. IMPRESSION: Negative for acute pulmonary embolism.  No significant abnormality. Electronically Signed   By: Andreas Newport M.D.   On: 07/14/2015 03:47    ASSESSMENT AND PLAN:    Principal Problem:   Chest pain, moderate coronary artery risk Active Problems:   Essential hypertension   Pain in the chest  Melinda [name] is a 82 y.o. female with a history of obesity, OSA on CPAP, HLD, HTN, DMT2 and no previous cardiac history who presented to [facility] on 07/13/15 with chest pain.   Chest pain: troponin neg x2. ECG on admission with NSR with LAD non specific ST and TW abnormalities. She has many RFs for CAD and had crushing left sided chest pain which was relieved by SL NTG. Also, heavy calcifications in coronary arteries on CTA chest done to rule out PE. Will plan for L/RHC with her history of pulmonary HTN and worsening DOE. -- Continue ASA, BB and statin   PAF: ECG on admission with clear P waves; however, telemetry reveals atrial fibrillation today. She seems to be asymptomatic with this. HR with moderate control. She will require long term AC for a CHADSVASC of at least 5 (HTN, DM, age, F sex). Will start heparin gtt for now and will decided on [facility] after cath depending on findings. Continue home lopressor 10m BID for now.   Pulm HTN: repeat 2D ECHO pending. RHC as above.   HTN: BP well controlled on current regimen  DMT2: SSI while here .  HLD: continue statin.   Signed:Eileen Stanford PA-C 07/14/2015 10:23 AM  Pager [phone] Co-Sign MD  I have  seen and examined the patient along with TEileen Stanford PA-C.  I have reviewed the chart, notes and new data.  I agree with PA's note.  Key new complaints: she describes typical exertional angina walking to mailbox (mild), with identical more severe chest pressure at rest leading to the current admission. Also has longstanding and progressive exertional dyspnea. Key examination changes: irregular rhythm - telemetry shows AFib ever since she was hooked up to tele, butED ECG showed SR. Key new findings / data: note extensive coronary calcifications on CT chest; RV is dilated, suggesting cor pulmonale due to OSA  PLAN: I think we should proceed directly to R and L heart cath. She has unstable angina (accelerated and one episode at rest) and there has been conflicting evidence of cardiac versus respiratory cause of her dyspnea. Suspect she has chronic mild pulmonary artery HTN due to OSA, but that her recent symptoms reflect CAD, likely multivessel disease.  Will need long term anticoagulation for embolism prevention due to AFib (CHADSVasc at least 6, if not 7 - she has evidence of vascular disease, probably has diastolic HF). Choice of agent will depend on whether  or not she needs PCI-stent and depends on the choice of antiplatelet agent (if placed on Brilinta, prefer warfarin; if on plavix or just on ASA, prefer DOAC).  The R&L heart cath and possible PCI-stent procedure has been fully reviewed with the patient and written informed consent has been obtained.   Sanda Klein, MD, [place] [phone] 07/14/2015, 11:46 AM

## 2015-07-14 NOTE — Progress Notes (Signed)
Pt is alert and oriented, able to ambulate to bathroom with rolling walker.

## 2015-07-14 NOTE — ED Notes (Signed)
Phlebotomy at bedside at this time.

## 2015-07-15 ENCOUNTER — Encounter (HOSPITAL_COMMUNITY): Payer: Medicare HMO

## 2015-07-15 ENCOUNTER — Observation Stay (HOSPITAL_BASED_OUTPATIENT_CLINIC_OR_DEPARTMENT_OTHER): Payer: Medicare HMO

## 2015-07-15 ENCOUNTER — Encounter (HOSPITAL_COMMUNITY): Payer: Self-pay | Admitting: Cardiovascular Disease

## 2015-07-15 ENCOUNTER — Telehealth: Payer: Self-pay | Admitting: Cardiology

## 2015-07-15 DIAGNOSIS — I10 Essential (primary) hypertension: Secondary | ICD-10-CM | POA: Diagnosis not present

## 2015-07-15 DIAGNOSIS — I4891 Unspecified atrial fibrillation: Secondary | ICD-10-CM

## 2015-07-15 DIAGNOSIS — E785 Hyperlipidemia, unspecified: Secondary | ICD-10-CM | POA: Diagnosis not present

## 2015-07-15 DIAGNOSIS — I480 Paroxysmal atrial fibrillation: Secondary | ICD-10-CM | POA: Diagnosis not present

## 2015-07-15 DIAGNOSIS — R079 Chest pain, unspecified: Secondary | ICD-10-CM | POA: Diagnosis not present

## 2015-07-15 LAB — CBC
HCT: 31.4 % — ABNORMAL LOW (ref 36.0–46.0)
Hemoglobin: 9.9 g/dL — ABNORMAL LOW (ref 12.0–15.0)
MCH: 29.3 pg (ref 26.0–34.0)
MCHC: 31.5 g/dL (ref 30.0–36.0)
MCV: 92.9 fL (ref 78.0–100.0)
PLATELETS: 236 10*3/uL (ref 150–400)
RBC: 3.38 MIL/uL — AB (ref 3.87–5.11)
RDW: 13.4 % (ref 11.5–15.5)
WBC: 4.5 10*3/uL (ref 4.0–10.5)

## 2015-07-15 LAB — ECHOCARDIOGRAM COMPLETE: Weight: 2737 oz

## 2015-07-15 MED ORDER — METOPROLOL TARTRATE 50 MG PO TABS
50.0000 mg | ORAL_TABLET | Freq: Two times a day (BID) | ORAL | Status: DC
  Administered 2015-07-15: 50 mg via ORAL

## 2015-07-15 MED ORDER — APIXABAN 5 MG PO TABS: 5.0000 mg | ORAL_TABLET | Freq: Two times a day (BID) | ORAL | Status: AC

## 2015-07-15 NOTE — Progress Notes (Signed)
PIV removed. Education and appointments reviewed with patient. No questions or concerns at this time.

## 2015-07-15 NOTE — Progress Notes (Signed)
Pt started on Eliquis- insurance check completed- Pt copay will be $100- PA not required   Pt given 30 day free card for use on discharge

## 2015-07-15 NOTE — Care Management Obs Status (Signed)
[facility] NOTIFICATION   Patient Details  Name: Melinda [name] MRN: [id number] Date of Birth: 03-10-1931   Medicare Observation Status Notification Given:  Yes    Dawayne Patricia, RN 07/15/2015, 10:46 AM

## 2015-07-15 NOTE — Discharge Summary (Signed)
Physician Discharge Summary  Melinda [name] AJG:[id number] DOB: [birth year] DOA: 07/13/2015  PCP: Jerlyn Ly, MD  Admit date: 07/13/2015 Discharge date: 07/15/2015  Recommendations for Outpatient Follow-up:  1. F/U with cardio per scheduled appt 2. New medication is apixaban 5 mg twice a day  Discharge Diagnoses:  Principal Problem:   Chest pain, moderate coronary artery risk Active Problems:   Essential hypertension   Pain in the chest   PAF (paroxysmal atrial fibrillation) (HCC)   Pulmonary arterial hypertension ([name])    Discharge Condition: stable   Diet recommendation: as tolerated   History of present illness:  82 y.o. female with medical history significant for HTN, GERD, recently hip replacement surgery on 4/20. Patient saw Dr. Martinique on 5/2 for a one year history of DOE and intermittent sharp left sided chest pain.Pt is scheduled for stress test on 5/12.The night prior to the admission patient developed crushing left sided chest pain, different from previous symptoms, associated with shortness of breath.Pain was better with nitroglycerin. Pt was hemodynamically stale. The 12 lead EKG showed slight ST segment depressions in lateral leads that wasn't there on 4/18. CXR showed no acute cardiopulmonary findings. CT angio chest did not show PE. The troponin levels were WNL. She underwent cardiac cath with findings indicated below.   Hospital Course:   Assessment and plan:  Chest pain, left sided - The trop level x 2 negative - The 12 lead EKG showed slight ST segment depressions in lateral leads that wasn't there on 4/18 - Cardiac cath done 5/8 with findings of mild coronary atherosclerosis, not hemodynamically significant. - Will get PFT's and ECHO prior to discharge - Cardio recommended apixaban 5 mg BID, prescription provided - Continue current home regimen aspirin   Dyslipidemia - Continue statin therapy   Essential hypertension - Continue norvasc,  metoprolol and lisinopril      Procedures: Cardiac cath 07/14/2015 -  Minimally elevated LVEDP at rest, consistent with mild diastolic dysfunction. Mild pulmonary artery hypertension. Transpulmonary gradient >12 mm Hg suggests intrinsic pulmonary arteriolar hypertension, consistent with a pulmonary cause for PAH and dyspnea.  RECOMMENDATION:  Check pulmonary function tests and reevaluate CPAP treatment settings for OSA. Symptoms appear to be non-cardiac in etiology. Statin therapy is probably appropriate. Echocardiogram should be performed, but would be more informative when she is in NSR. Start direct oral anticoagulant 4 hours after cath if no radial site bleeding.  SignedLeisa Lenz, MD  [facility] 07/15/2015, 9:26 AM  Pager #: [phone]  Time spent in minutes: more than 30 minutes   Discharge Exam: Filed Vitals:   07/14/15 2254 07/15/15 0524  BP: 132/66 119/67  Pulse: 94 86  Temp: 98.3 F (36.8 C) 98.2 F (36.8 C)  Resp: 18 18   Filed Vitals:   07/14/15 1832 07/14/15 1846 07/14/15 2254 07/15/15 0524  BP: 145/72 151/75 132/66 119/67  Pulse: 87 92 94 86  Temp:   98.3 F (36.8 C) 98.2 F (36.8 C)  TempSrc:   Oral Oral  Resp:   18 18  Weight:      SpO2: 98% 100% 93% 93%    General: Pt is alert, follows commands appropriately, not in acute distress Cardiovascular: Regular rate and rhythm, S1/S2  Respiratory: Clear to auscultation bilaterally, no wheezing, no crackles, no rhonchi Abdominal: Soft, non tender, non distended, bowel sounds +, no guarding Extremities: trace lower extremity edema, no cyanosis, pulses palpable bilaterally DP and PT Neuro: Grossly nonfocal  Discharge Instructions  Discharge Instructions  Call MD for:  difficulty breathing, headache or visual disturbances    Complete by:  As directed      Call MD for:  persistant dizziness or light-headedness    Complete by:  As directed      Call MD for:  persistant nausea and  vomiting    Complete by:  As directed      Call MD for:  severe uncontrolled pain    Complete by:  As directed      Diet - low sodium heart healthy    Complete by:  As directed      Discharge instructions    Complete by:  As directed   New medication is Apixaban 5 mg twice a day.     Increase activity slowly    Complete by:  As directed             Medication List    TAKE these medications        ACCU-CHEK ACTIVE STRIPS test strip  Generic drug:  glucose blood  1 each by Other route as needed for other. Reported on 07/11/2015     acetaminophen 650 MG CR tablet  Commonly known as:  TYLENOL  Take 650 mg by mouth every 8 (eight) hours as needed for pain.     amLODipine 5 MG tablet  Commonly known as:  NORVASC  Take 5 mg by mouth daily.     apixaban 5 MG Tabs tablet  Commonly known as:  ELIQUIS  Take 1 tablet (5 mg total) by mouth 2 (two) times daily.     aspirin EC 325 MG tablet  Take 1 tablet (325 mg total) by mouth 2 (two) times daily.     atorvastatin 40 MG tablet  Commonly known as:  LIPITOR  Take 40 mg by mouth daily. One tablet in am     baclofen 10 MG tablet  Commonly known as:  LIORESAL  TAKE 1 TABLET BY MOUTH THREE TIMES DAILY AS NEEDED     CALCIUM 600+D3 600-400 MG-UNIT Tabs  Generic drug:  Calcium Carbonate-Vitamin D3  Take 1 tablet by mouth daily.     cholecalciferol 1000 units tablet  Commonly known as:  VITAMIN D  Take 1,000 Units by mouth daily. D3     desipramine 25 MG tablet  Commonly known as:  NORPRAMIN  Take 25 mg by mouth daily.     Fish Oil 1200 MG Caps  Take 1 capsule by mouth daily.     glucosamine-chondroitin 500-400 MG tablet  Take 1 tablet by mouth every other day.     linaclotide 145 MCG Caps capsule  Commonly known as:  LINZESS  Take 145 mcg by mouth daily before breakfast.     lisinopril 20 MG tablet  Commonly known as:  PRINIVIL,ZESTRIL  Take 20 mg by mouth daily.     metoprolol 50 MG tablet  Commonly known as:   LOPRESSOR  Take 25 mg by mouth 2 (two) times daily. Take 1/2 tablet by mouth twice daily     omeprazole 20 MG capsule  Commonly known as:  PRILOSEC  Take 20 mg by mouth daily. One tablet daily in mornings     ONE TOUCH ULTRA 2 w/Device Kit  Reported on 03/13/1094     [facility] DELICA LANCETS 04V Misc  Reported on 07/11/2015     polyethylene glycol packet  Commonly known as:  MIRALAX / GLYCOLAX  Take 17 g by mouth 2 (two) times daily.  PROCEL 100 Powd  Take 1 scoop by mouth 2 (two) times daily.     temazepam 7.5 MG capsule  Commonly known as:  RESTORIL  Take 7.5 mg by mouth at bedtime.     vitamin B-12 500 MCG tablet  Commonly known as:  CYANOCOBALAMIN  Take 1,000 mcg by mouth daily.           Follow-up Information    Follow up with PERINI,MARK A, MD. Schedule an appointment as soon as possible for a visit in 1 week.   Specialty:  Internal Medicine   Why:  Follow up appt after recent hospitalization   Contact information:   [place] [address] [phone]        The results of significant diagnostics from this hospitalization (including imaging, microbiology, ancillary and laboratory) are listed below for reference.    Significant Diagnostic Studies: Dg Chest 2 View  07/14/2015  CLINICAL DATA:  Lower mid chest pain earlier tonight. EXAM: CHEST  2 VIEW COMPARISON:  07/13/2004 FINDINGS: The lungs are clear. The pulmonary vasculature is normal. Heart size is normal. Hilar and mediastinal contours are unremarkable. There is no pleural effusion. IMPRESSION: No active cardiopulmonary disease. Electronically Signed   By: Andreas Newport M.D.   On: 07/14/2015 01:21   Ct Angio Chest Pe W/cm &/or Wo Cm  07/14/2015  CLINICAL DATA:  Chest pain and dyspnea. EXAM: CT ANGIOGRAPHY CHEST WITH CONTRAST TECHNIQUE: Multidetector CT imaging of the chest was performed using the standard protocol during bolus administration of intravenous contrast. Multiplanar CT image  reconstructions and MIPs were obtained to evaluate the vascular anatomy. CONTRAST:  100 mL Isovue 370 intravenous COMPARISON:  Radiographs 07/14/2015 FINDINGS: Cardiovascular: There is good opacification of the pulmonary arteries. There is no pulmonary embolism. The thoracic aorta is normal in caliber and intact. Lungs: Clear Central airways: Beta Effusions: None Lymphadenopathy: None Esophagus: Unremarkable Upper abdomen: Unremarkable Musculoskeletal: No significant abnormality Review of the MIP images confirms the above findings. IMPRESSION: Negative for acute pulmonary embolism.  No significant abnormality. Electronically Signed   By: Andreas Newport M.D.   On: 07/14/2015 03:47   Dg Pelvis Portable  06/30/2015  CLINICAL DATA:  Status post right hip replacement EXAM: PORTABLE PELVIS 1-2 VIEWS COMPARISON:  06/26/2015 FINDINGS: Right hip replacement is again identified and stable. Undisplaced fracture in the medial aspect of the acetabulum is again noted and stable. The left hip is within normal limits. No other focal abnormality is seen. IMPRESSION: Status post right hip replacement. Stable acetabular fracture is noted. Electronically Signed   By: Inez Catalina M.D.   On: 06/30/2015 08:44   Dg Pelvis Portable  06/26/2015  CLINICAL DATA:  Status post total hip arthroplasty. EXAM: PORTABLE PELVIS 1-2 VIEWS COMPARISON:  Intraoperative films, same date. FINDINGS: Well seated components of a total hip arthroplasty. There is a small suspected acetabular fracture along the medial wall. The pubic symphysis and pubic rami are normal. The left hip is normally located. IMPRESSION: Well seated components of a total right hip arthroplasty. Suspect small medial wall acetabular fracture. These results will be called to the ordering clinician or representative by the Radiologist Assistant, and communication documented in the PACS or zVision Dashboard. Electronically Signed   By: Marijo Sanes M.D.   On: 06/26/2015 16:18    Ct Hip Right Wo Contrast  06/26/2015  CLINICAL DATA:  Status post right total hip arthroplasty. Possible fracture on postop x-ray. EXAM: CT OF THE RIGHT HIP WITHOUT CONTRAST TECHNIQUE: Multidetector  CT imaging of the right hip was performed according to the standard protocol. Multiplanar CT image reconstructions were also generated. COMPARISON:  None. FINDINGS: There is interval right total hip arthroplasty. There is a nondisplaced fracture along the posterior medial aspect just peripheral to the acetabular cup component. There is no dislocation. There are postsurgical changes in the surrounding soft tissues with subcutaneous emphysema and mild edema. There is no focal fluid collection or hematoma. There is no aggressive lytic or sclerotic osseous lesion. There is no other fracture or dislocation. IMPRESSION: 1. Interval right total hip arthroplasty. Nondisplaced fracture along the posterior medial aspect of, just peripheral to, the acetabular cup component. 2. Postsurgical changes in the soft tissues surrounding the right hip. Electronically Signed   By: Kathreen Devoid   On: 06/26/2015 17:20   Dg Hip Operative Unilat W Or W/o Pelvis Right  06/26/2015  CLINICAL DATA:  Portable imaging from a right hip arthroplasty. EXAM: OPERATIVE RIGHT HIP (WITH PELVIS IF PERFORMED) 2 VIEWS TECHNIQUE: Fluoroscopic spot image(s) were submitted for interpretation post-operatively. COMPARISON:  None. FINDINGS: There is a total right hip arthroplasty. The femoral and acetabular components appear well seated and well aligned on the 2 AP views submitted. There is no acute fracture or evidence of an operative complication. IMPRESSION: Well-aligned right hip total arthroplasty. Electronically Signed   By: Lajean Manes M.D.   On: 06/26/2015 15:24    Microbiology: No results found for this or any previous visit (from the past 240 hour(s)).   Labs: Basic Metabolic Panel:  Recent Labs Lab 07/14/15 0019  NA 137  K 4.1  CL  104  CO2 24  GLUCOSE 164*  BUN 15  CREATININE 0.87  CALCIUM 9.3   Liver Function Tests:  Recent Labs Lab 07/14/15 0019  AST 15  ALT 14  ALKPHOS 87  BILITOT 0.6  PROT 5.8*  ALBUMIN 3.2*   No results for input(s): LIPASE, AMYLASE in the last 168 hours. No results for input(s): AMMONIA in the last 168 hours. CBC:  Recent Labs Lab 07/14/15 0019 07/15/15 0209  WBC 6.7 4.5  NEUTROABS 5.2  --   HGB 10.0* 9.9*  HCT 30.8* 31.4*  MCV 94.8 92.9  PLT 239 236   Cardiac Enzymes:  Recent Labs Lab 07/14/15 0019 07/14/15 0556 07/14/15 1014 07/14/15 1723  TROPONINI <0.03 <0.03 <0.03 <0.03   BNP: BNP (last 3 results)  Recent Labs  07/14/15 0019  BNP 640.5*    ProBNP (last 3 results) No results for input(s): PROBNP in the last 8760 hours.  CBG: No results for input(s): GLUCAP in the last 168 hours.

## 2015-07-15 NOTE — Progress Notes (Signed)
Echocardiogram 2D Echocardiogram has been performed.  Tresa Res 07/15/2015, 10:26 AM

## 2015-07-15 NOTE — Progress Notes (Signed)
Subjective:  Denies SSCP, palpitations or Dyspnea   Objective:  Filed Vitals:   07/14/15 1832 07/14/15 1846 07/14/15 2254 07/15/15 0524  BP: 145/72 151/75 132/66 119/67  Pulse: 87 92 94 86  Temp:   98.3 F (36.8 C) 98.2 F (36.8 C)  TempSrc:   Oral Oral  Resp:   18 18  Weight:      SpO2: 98% 100% 93% 93%    Intake/Output from previous day:  Intake/Output Summary (Last 24 hours) at 07/15/15 0855 Last data filed at 07/14/15 1300  Gross per 24 hour  Intake      0 ml  Output    600 ml  Net   -600 ml    Physical Exam: Affect appropriate Healthy:  appears stated age HEENT: normal Neck supple with no adenopathy JVP normal no bruits no thyromegaly Lungs clear with no wheezing and good diaphragmatic motion Heart:  S1/S2 no murmur, no rub, gallop or click PMI normal Abdomen: benighn, BS positve, no tenderness, no AAA no bruit.  No HSM or HJR Distal pulses intact with no bruits No edema Neuro non-focal Skin warm and dry No muscular weakness Right radial cath sight A   Lab Results: Basic Metabolic Panel:  Recent Labs  07/14/15 0019  NA 137  K 4.1  CL 104  CO2 24  GLUCOSE 164*  BUN 15  CREATININE 0.87  CALCIUM 9.3   Liver Function Tests:  Recent Labs  07/14/15 0019  AST 15  ALT 14  ALKPHOS 87  BILITOT 0.6  PROT 5.8*  ALBUMIN 3.2*   CBC:  Recent Labs  07/14/15 0019 07/15/15 0209  WBC 6.7 4.5  NEUTROABS 5.2  --   HGB 10.0* 9.9*  HCT 30.8* 31.4*  MCV 94.8 92.9  PLT 239 236   Cardiac Enzymes:  Recent Labs  07/14/15 0556 07/14/15 1014 07/14/15 1723  TROPONINI <0.03 <0.03 <0.03    Imaging: Dg Chest 2 View  07/14/2015  CLINICAL DATA:  Lower mid chest pain earlier tonight. EXAM: CHEST  2 VIEW COMPARISON:  07/13/2004 FINDINGS: The lungs are clear. The pulmonary vasculature is normal. Heart size is normal. Hilar and mediastinal contours are unremarkable. There is no pleural effusion. IMPRESSION: No active cardiopulmonary disease.  Electronically Signed   By: Andreas Newport M.D.   On: 07/14/2015 01:21   Ct Angio Chest Pe W/cm &/or Wo Cm  07/14/2015  CLINICAL DATA:  Chest pain and dyspnea. EXAM: CT ANGIOGRAPHY CHEST WITH CONTRAST TECHNIQUE: Multidetector CT imaging of the chest was performed using the standard protocol during bolus administration of intravenous contrast. Multiplanar CT image reconstructions and MIPs were obtained to evaluate the vascular anatomy. CONTRAST:  100 mL Isovue 370 intravenous COMPARISON:  Radiographs 07/14/2015 FINDINGS: Cardiovascular: There is good opacification of the pulmonary arteries. There is no pulmonary embolism. The thoracic aorta is normal in caliber and intact. Lungs: Clear Central airways: Beta Effusions: None Lymphadenopathy: None Esophagus: Unremarkable Upper abdomen: Unremarkable Musculoskeletal: No significant abnormality Review of the MIP images confirms the above findings. IMPRESSION: Negative for acute pulmonary embolism.  No significant abnormality. Electronically Signed   By: Andreas Newport M.D.   On: 07/14/2015 03:47    Cardiac Studies:  ECG: fib/flutter low voltage no acute ST changes   Telemetry: afib rate 90-100  Echo: pending   Medications:   . amLODipine  5 mg Oral Daily  . apixaban  5 mg Oral BID  . aspirin EC  325 mg Oral BID  . atorvastatin  40 mg Oral q1800  . baclofen  10 mg Oral TID  . desipramine  25 mg Oral Daily  . linaclotide  145 mcg Oral QAC breakfast  . lisinopril  20 mg Oral Daily  . metoprolol  25 mg Oral BID  . pantoprazole  40 mg Oral Daily  . polyethylene glycol  17 g Oral BID  . sodium chloride flush  3 mL Intravenous Q12H  . temazepam  7.5 mg Oral QHS       Assessment/Plan:   Chest PaiN;  Cath yesterday with no CAD  Radial sight healed well PAF:  CHADVASC 5 started on eliquis with no CAD decrease asa to 81 mg stop amlodipine increase lopressor for rate control Pulmonary: mild elevation in PA pressure echo prior to d/c and PFTls    Ok to d/c latter today after echo / PFT;s will arrange outpatient f/u Dr Martinique  [facility] 07/15/2015, 8:55 AM

## 2015-07-15 NOTE — Discharge Instructions (Addendum)
Apixaban oral tablets °What is this medicine? °APIXABAN (a PIX a ban) is an anticoagulant (blood thinner). It is used to lower the chance of stroke in people with a medical condition called atrial fibrillation. It is also used to treat or prevent blood clots in the lungs or in the veins. °This medicine may be used for other purposes; ask your health care provider or pharmacist if you have questions. °What should I tell my health care provider before I take this medicine? °They need to know if you have any of these conditions: °-bleeding disorders °-bleeding in the brain °-blood in your stools (black or tarry stools) or if you have blood in your vomit °-history of stomach bleeding °-kidney disease °-liver disease °-mechanical heart valve °-an unusual or allergic reaction to apixaban, other medicines, foods, dyes, or preservatives °-pregnant or trying to get pregnant °-breast-feeding °How should I use this medicine? °Take this medicine by mouth with a glass of water. Follow the directions on the prescription label. You can take it with or without food. If it upsets your stomach, take it with food. Take your medicine at regular intervals. Do not take it more often than directed. Do not stop taking except on your doctor's advice. Stopping this medicine may increase your risk of a blot clot. Be sure to refill your prescription before you run out of medicine. °Talk to your pediatrician regarding the use of this medicine in children. Special care may be needed. °Overdosage: If you think you have taken too much of this medicine contact a poison control center or emergency room at once. °NOTE: This medicine is only for you. Do not share this medicine with others. °What if I miss a dose? °If you miss a dose, take it as soon as you can. If it is almost time for your next dose, take only that dose. Do not take double or extra doses. °What may interact with this medicine? °This medicine may interact with the following: °-aspirin  and aspirin-like medicines °-certain medicines for fungal infections like ketoconazole and itraconazole °-certain medicines for seizures like carbamazepine and phenytoin °-certain medicines that treat or prevent blood clots like warfarin, enoxaparin, and dalteparin °-clarithromycin °-NSAIDs, medicines for pain and inflammation, like ibuprofen or naproxen °-rifampin °-ritonavir °-St. John's wort °This list may not describe all possible interactions. Give your health care provider a list of all the medicines, herbs, non-prescription drugs, or dietary supplements you use. Also tell them if you smoke, drink alcohol, or use illegal drugs. Some items may interact with your medicine. °What should I watch for while using this medicine? °Notify your doctor or health care professional and seek emergency treatment if you develop breathing problems; changes in vision; chest pain; severe, sudden headache; pain, swelling, warmth in the leg; trouble speaking; sudden numbness or weakness of the face, arm, or leg. These can be signs that your condition has gotten worse. °If you are going to have surgery, tell your doctor or health care professional that you are taking this medicine. °Tell your health care professional that you use this medicine before you have a spinal or epidural procedure. Sometimes people who take this medicine have bleeding problems around the spine when they have a spinal or epidural procedure. This bleeding is very rare. If you have a spinal or epidural procedure while on this medicine, call your health care professional immediately if you have back pain, numbness or tingling (especially in your legs and feet), muscle weakness, paralysis, or loss of bladder or bowel   control. Avoid sports and activities that might cause injury while you are using this medicine. Severe falls or injuries can cause unseen bleeding. Be careful when using sharp tools or knives. Consider using an Copy. Take special care  brushing or flossing your teeth. Report any injuries, bruising, or red spots on the skin to your doctor or health care professional. What side effects may I notice from receiving this medicine? Side effects that you should report to your doctor or health care professional as soon as possible: -allergic reactions like skin rash, itching or hives, swelling of the face, lips, or tongue -signs and symptoms of bleeding such as bloody or black, tarry stools; red or dark-brown urine; spitting up blood or brown material that looks like coffee grounds; red spots on the skin; unusual bruising or bleeding from the eye, gums, or nose This list may not describe all possible side effects. Call your doctor for medical advice about side effects. You may report side effects to FDA at 1-800-FDA-1088. Where should I keep my medicine? Keep out of the reach of children. Store at room temperature between 20 and 25 degrees C (68 and 77 degrees F). Throw away any unused medicine after the expiration date. NOTE: This sheet is a summary. It may not cover all possible information. If you have questions about this medicine, talk to your doctor, pharmacist, or health care provider.    2016, Elsevier/Gold Standard. (2012-10-27 11:59:24)    Information on my medicine - ELIQUIS (apixaban)  This medication education was reviewed with me or my healthcare representative as part of my discharge preparation.    Why was Eliquis prescribed for you? Eliquis was prescribed for you to reduce the risk of a blood clot forming that can cause a stroke if you have a medical condition called atrial fibrillation (a type of irregular heartbeat).  What do You need to know about Eliquis ? Take your Eliquis TWICE DAILY - one tablet in the morning and one tablet in the evening with or without food. If you have difficulty swallowing the tablet whole please discuss with your pharmacist how to take the medication safely.  Take Eliquis  exactly as prescribed by your doctor and DO NOT stop taking Eliquis without talking to the doctor who prescribed the medication.  Stopping may increase your risk of developing a stroke.  Refill your prescription before you run out.  After discharge, you should have regular check-up appointments with your healthcare provider that is prescribing your Eliquis.  In the future your dose may need to be changed if your kidney function or weight changes by a significant amount or as you get older.  What do you do if you miss a dose? If you miss a dose, take it as soon as you remember on the same day and resume taking twice daily.  Do not take more than one dose of ELIQUIS at the same time to make up a missed dose.  Important Safety Information A possible side effect of Eliquis is bleeding. You should call your healthcare provider right away if you experience any of the following: ? Bleeding from an injury or your nose that does not stop. ? Unusual colored urine (red or dark brown) or unusual colored stools (red or black). ? Unusual bruising for unknown reasons. ? A serious fall or if you hit your head (even if there is no bleeding).  Some medicines may interact with Eliquis and might increase your risk of bleeding or clotting while on  Eliquis. To help avoid this, consult your healthcare provider or pharmacist prior to using any new prescription or non-prescription medications, including herbals, vitamins, non-steroidal anti-inflammatory drugs (NSAIDs) and supplements.  This website has more information on Eliquis (apixaban): http://www.eliquis.com/eliquis/home

## 2015-07-16 ENCOUNTER — Telehealth (HOSPITAL_COMMUNITY): Payer: Self-pay

## 2015-07-16 NOTE — Telephone Encounter (Signed)
Encounter complete. 

## 2015-07-17 NOTE — Telephone Encounter (Signed)
Close encounter 

## 2015-07-18 ENCOUNTER — Inpatient Hospital Stay (HOSPITAL_COMMUNITY): Admission: RE | Admit: 2015-07-18 | Payer: Medicare HMO | Source: Ambulatory Visit

## 2015-07-22 ENCOUNTER — Ambulatory Visit (INDEPENDENT_AMBULATORY_CARE_PROVIDER_SITE_OTHER): Payer: Medicare HMO | Admitting: Neurology

## 2015-07-22 ENCOUNTER — Telehealth: Payer: Self-pay | Admitting: Neurology

## 2015-07-22 ENCOUNTER — Encounter: Payer: Self-pay | Admitting: Neurology

## 2015-07-22 VITALS — BP 122/70 | HR 88 | Resp 20 | Ht 60.0 in | Wt 170.0 lb

## 2015-07-22 DIAGNOSIS — Z9989 Dependence on other enabling machines and devices: Secondary | ICD-10-CM

## 2015-07-22 DIAGNOSIS — R0902 Hypoxemia: Secondary | ICD-10-CM

## 2015-07-22 DIAGNOSIS — G4733 Obstructive sleep apnea (adult) (pediatric): Secondary | ICD-10-CM | POA: Diagnosis not present

## 2015-07-22 NOTE — Telephone Encounter (Signed)
I advised pt that we have not found her cell phone after looking in the lobby and the exam rooms but will continue to look for it. Pt verbalized understanding.

## 2015-07-22 NOTE — Telephone Encounter (Signed)
Patient called in and states she lost her cell phone today possibly in the examining room this morning while seeing Dr. Lucretia Roers.  Please call Victorino December @[phone]/

## 2015-07-22 NOTE — Telephone Encounter (Signed)
[name] other sister Melinda [name] called back and left this phone [phone].

## 2015-07-22 NOTE — Progress Notes (Signed)
[facility]  PATIENT: Melinda [name] DOB: Feb 04, 1932   REASON FOR VISIT: Followup for sleep apnea ;   HISTORY OF PRESENT ILLNESS: CM, last note 2015 :  Melinda [name] 82 year old female returns for followup. She patient reports that her gait problem has stabilized she's doing better had not had a fall for the months.  She occasionally can use a cane or walker on bad days but not very often.  She was finally diagnosed officially with lumbar spinal stenosis, she has lost some muscle mass all through the hamstrings and thigh, lost some core muscle strength. She had previously fallen many times onto her left knee which still gives her trouble at times   The patient has more pain in her knee when walking on tiptoes, she does not have foot drop . She says she has been told she has arthritis. She also complains of occasional burning paresthesias in the leg however this is very brief and once or twice a month. She does not wish to be placed on a medication. She remains on CPAP and she brought her machine for download. She does say that her machine is loud and old and she sometimes cuts it off due to the sound. She returns for reevaluation  03-22-14: Melinda [name] is here today for his yearly visit. She is an established CPAP user and endorsed today the Epworth sleepiness score at 4 points and fatigue severity score at 47 points. A download of her CPAP was obtained today in the office,  showing a moderate to high air leak. However her apnea and hypopnea index is still well controlled and the patient is highly compliant. She is using CPAP at 10 cm water with a residual AHI of 6.8 and an average time of daily CPAP use of 7 hours and 17 minutes. She is considered 90% compliant by CMS criteria which is sufficient. The airleak however is some quite significant. The patient states that she notices a noise from the airleak and sometimes it will wake her up her machine is an outer set of  onset machine and I feel that there must be a more comfortable mask for her to use without creating air leaks. She has used a nasal pillow since last year when I suggested the change. She notices that the airleak is worse when the head gear slips up over her scalp and once she pulls it back down the air leak stops. For the airleak seems to be episodic and not a chronic fact. The patient's medication list and allergy list was reviewed with my nurse, before the patient was back to the [place] area she resided on [place] and she have to drive about 4 hours or longer to see her local physicians at that time she used armodafinil or Nuvigil to keep herself awake and safely driving. There is no needs to refill this medication now.  03-27-15  Patient here for a CPAP compliance visit. Melinda [name] has moved to the [facility] area to be closer to her physicians and medical system.  She endorsed the geriatric depression score today at only 1., she endorsed fatigue severity at 46 points and the Epworth sleepiness score at 7 points. She assured me that she has been weak using her CPAP compliantly but the model is no longer supported by a manufacturer and I'm not longer able to get data downloads from the machine. Since I have followed Melinda [name] for about 8 years and this is the age of her  current machine. To assure that she has continued apnea care, I will invite her for a re-titration to CPAP.  I would like one hour of baseline sleep to document her current level of apnea. The patient's CPAP machine also has a no longer functioning heating and humidifier system. For this reason alone should be exchanged. I will asked the CPAP to make an appointment that works for Melinda [name] schedule.  With data from the sleep study we should be able to order a new asleep machine for her covered by her current provided [facility].   I see Melinda [name] today on 07/22/2015 after a recent hip replacement surgery on the  right side. After completing her rehabilitation and returning home she suffered chest pain the very next day was brought to the local emergency room where a cardiac cause was ruled out. Pulmonary embolism was also not found. The chest and has meanwhile resolved but its cause was never identified. Her atrial fibrillation also acted out and she had frequently atrial fibrillation spells, she is not chronically in A. Fib, but paroxysmally. Based on her history of atrial fibrillation and known obstructive sleep apnea she underwent a new polysomnography on 05/08/2015 the AHI was still 17.4, supine AHI 28.9, and the REM AHI was 52.3. No periodic limb movements were noted that the patient had prolonged desaturations 240 minutes of low oxygen with the nadir being at 74%. She was in need of a new CPAP machine ASAP alternative treatment such as dental devices or ENT surgery would not treat hypoxemia. 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 During prolonged toxemia there was no drop of muscle tone during REM sleep. On a pin has been used to treat this condition. The study was followed by a CPAP titration on 05/28/2015 9 cm water pressure seemed to work well for the patient. She still had 198 minutes of desaturations during the CPAP titration study. For this reason we will follow her with a pulse oximetry on CPAP. Her compliance data speak of 80% compliance for over 4 hours of use but she uses the machine 93% of all days. She is using AutoSet right now between 5 and 12 cm water average user time is 6 hours and 26 minutes. The residual AHI is 5.3. Please note that unknown events are 2.3 are likely related to air leaks. Air leaks very greatly between severe and minor day by day she is no longer is tired and has noted more energy and more restorative sleep on a new machine the 91st percentile pressure is 10.2 and the is no need to change the settings. However with her history of paroxysmal A. fib this should be a fingertip  pulse oximetry to follow while on CPAP, she advised me that she just did this test last Thursday night. I do not have results for it yet.       REVIEW OF SYSTEMS: Full 14 system review of systems performed and notable only for those listed, all others are neg:   Hearing loss   cough, shortness of breath, palpitations.  Constipation  Easy bruising Joint pain joint swelling, walking difficulty- on walker   Depression not clinically evident 3/15 on geriatric score.  Epworth 9 and FSS  33 from 47 points, chronic insomnia, has trouble to fall asleep- but improved on new CPAP  Usually reads while  waiting to go to sleep.   ALLERGIES: Allergies  Allergen Reactions  . Phenobarbital Itching    HOME MEDICATIONS: Outpatient Prescriptions Prior to Visit  Medication Sig Dispense Refill  . acetaminophen (TYLENOL) 650 MG CR tablet Take 650 mg by mouth every 8 (eight) hours as needed for pain.    [name] amLODipine (NORVASC) 5 MG tablet Take 5 mg by mouth daily.    [name] apixaban (ELIQUIS) 5 MG TABS tablet Take 1 tablet (5 mg total) by mouth 2 (two) times daily. 60 tablet 0  . aspirin EC 325 MG tablet Take 1 tablet (325 mg total) by mouth 2 (two) times daily. 84 tablet 0  . atorvastatin (LIPITOR) 40 MG tablet Take 40 mg by mouth daily. One tablet in am    . baclofen (LIORESAL) 10 MG tablet TAKE 1 TABLET BY MOUTH THREE TIMES DAILY AS NEEDED 270 tablet 0  . Blood Glucose Monitoring Suppl (ONE TOUCH ULTRA 2) W/DEVICE KIT Reported on 07/11/2015    . Calcium Carbonate-Vitamin D3 (CALCIUM 600+D3) 600-400 MG-UNIT TABS Take 1 tablet by mouth daily.    . cholecalciferol (VITAMIN D) 1000 UNITS tablet Take 1,000 Units by mouth daily. D3    . desipramine (NORPRAMIN) 25 MG tablet Take 25 mg by mouth daily.    [name] glucosamine-chondroitin 500-400 MG tablet Take 1 tablet by mouth every other day.     [name] glucose blood (ACCU-CHEK ACTIVE STRIPS) test strip 1 each by Other route as needed for other. Reported on 07/11/2015    .  linaclotide (LINZESS) 145 MCG CAPS capsule Take 145 mcg by mouth daily before breakfast.     . lisinopril (PRINIVIL,ZESTRIL) 20 MG tablet Take 20 mg by mouth daily.    . metoprolol (LOPRESSOR) 50 MG tablet Take 25 mg by mouth 2 (two) times daily. Take 1/2 tablet by mouth twice daily    . omeprazole (PRILOSEC) 20 MG capsule Take 20 mg by mouth daily. One tablet daily in mornings    . ONETOUCH DELICA LANCETS 41R MISC Reported on 07/11/2015    . polyethylene glycol (MIRALAX / GLYCOLAX) packet Take 17 g by mouth 2 (two) times daily.    . Protein (PROCEL 100) POWD Take 1 scoop by mouth 2 (two) times daily.    . vitamin B-12 (CYANOCOBALAMIN) 500 MCG tablet Take 1,000 mcg by mouth daily.     . Omega-3 Fatty Acids (FISH OIL) 1200 MG CAPS Take 1 capsule by mouth daily.     . temazepam (RESTORIL) 7.5 MG capsule Take 7.5 mg by mouth at bedtime.     No facility-administered medications prior to visit.    PAST MEDICAL HISTORY: Past Medical History  Diagnosis Date  . Hypercholesterolemia   . Acid reflux   . Obstructive sleep apnea   . Depression   . Insomnia   . Premature atrial contractions   . Anxiety   . Hypertension   . Cataract     surgery,bilateral  . Pyloric antral stenosis     in childhood,infancy  . Multifactorial gait disorder   . Sleep apnea with use of continuous positive airway pressure (CPAP) 10/19/2012  . Diabetes mellitus without complication ([place])   . Shortness of breath dyspnea     with exertion  . Hx: UTI (urinary tract infection)   . Arthritis   . Constipation   . Family history of adverse reaction to anesthesia     mother always had n/v  . PAF (paroxysmal atrial fibrillation) ([place])     PAST SURGICAL HISTORY: Past Surgical History  Procedure Laterality Date  . Abdominal hysterectomy  1970  . Transthoracic echocardiogram  07/23/2009    mild concentric LVH/mild  mitral insufficiency/ moderate tricuspid innsufficiency/mild pulmonary HTN/EF- 55-60%  . Nm myoview ltd   11/30/2007    normal stress nuclear study/EF-83%  . Vaginal delivery    . Inner ear surgery Left     x 3  . Colonoscopy    . Laparoscopic salpingo oopherectomy    . Tonsillectomy    . Abdominal surgery      as a 55 day old baby  . Eye surgery Bilateral     cataract surgery with lens implant  . Total hip arthroplasty Right 06/26/2015    Procedure: RIGHT TOTAL HIP ARTHROPLASTY ANTERIOR APPROACH;  Surgeon: Leandrew Koyanagi, MD;  Location: [facility];  Service: Orthopedics;  Laterality: Right;  . Cardiac catheterization N/A 07/14/2015    Procedure: Right/Left Heart Cath and Coronary Angiography;  Surgeon: Sanda Klein, MD;  Location: [facility];  Service: Cardiovascular;  Laterality: N/A;    FAMILY HISTORY: Family History  Problem Relation Age of Onset  . Heart attack Father   . Heart failure Mother   . Cancer Mother     colon,kidney  . Diabetes Sister   . Hypertension Sister   . Heart failure Sister     SOCIAL HISTORY: Social History   Social History  . Marital Status: Married    Spouse Name: N/A  . Number of Children: 1  . Years of Education: 13   Occupational History  . Retired    Social History Main Topics  . Smoking status: Never Smoker   . Smokeless tobacco: Never Used  . Alcohol Use: No     Comment: rare  . Drug Use: No  . Sexual Activity: Not on file   Other Topics Concern  . Not on file   Social History Narrative   Patient lives alone.    Patient is widowed.    Patient retired.    Patient some college.    Patient has one child.    Caffeine 1-2 cups daily avg.     PHYSICAL EXAM  There were no vitals filed for this visit. There is no weight on file to calculate BMI.   Neck circumference :   15.25 inches.  Mallampati: 3, with elongated uvular, red mucosa.    Nasal airflow : congestion, rhinitis . No TMJ pain nor retrognathia, dentures, partial upper.  Generalized: Well developed, obese female in no acute distress  Head: normocephalic and  atraumatic.Oropharynx benign  Neck: Supple, no carotid bruits Cardiac: Regular rate rhythm, no murmur  Neurological examination  Mentation: Alert oriented to time, place, history taking. Follows all commands speech and language fluent. ESS 11  Cranial nerve : Pupils were equal round reactive to light extraocular movements were full, visual field were full on confrontational test. Facial sensation and strength were normal. Hearing was intact to finger rubbing bilaterally. Uvula tongue midline. head turning and shoulder shrug and were normal and symmetric.Tongue protrusion into cheek strength was normal.  Motor: normal bulk and tone, full strength in the BUE, fine finger movements normal, no pronator drift. No focal weakness  Gait and Station: Rising up from seated position with bracing - uses a walker since hip replacement.   DIAGNOSTIC DATA (LABS, IMAGING, TESTING)   The patient describes staggering gait with a higher fall risk but denies feeling dizzy. She has completed a course of physical therapy but it has not necessarily stabilized her gait or reduced her fall risk she feels. We're today here to review her CPAP data, I obtained a download.  Her  machine's heating plate is not longer working, the machine is 80 years old and needs to be replaced.   ASSESSMENT AND PLAN   Obstructive sleep apnea with the use of continuous positive airway pressure; Insomnia;  chronic , yet improved on CPAP-  OSA on CPAP;   Likes her  new machine ,  No changes in settings. Her choice of interface    CC Dr Joylene Draft.    RV after sleep study:  [facility] [address] [place], [place] [address] [phone]

## 2015-07-22 NOTE — Telephone Encounter (Signed)
I checked the exam room and the front desk. No cell phone has been found. I called Christen Butter phone as documented and left a message asking pt to call me back. If pt calls back, please advise her of this information. I will continue to look for it but she should check her house and the car.

## 2015-07-29 ENCOUNTER — Telehealth: Payer: Self-pay

## 2015-07-29 ENCOUNTER — Ambulatory Visit (HOSPITAL_COMMUNITY)
Admit: 2015-07-29 | Discharge: 2015-07-29 | Disposition: A | Discharge status: Home / Self Care | Payer: Medicare HMO | Attending: Cardiovascular Disease | Admitting: Cardiovascular Disease

## 2015-07-29 DIAGNOSIS — R0600 Dyspnea, unspecified: Secondary | ICD-10-CM | POA: Insufficient documentation

## 2015-07-29 LAB — PULMONARY FUNCTION TEST
DL/VA % PRED: 60 %
DL/VA: 2.59 ml/min/mmHg/L
DLCO UNC: 9.31 ml/min/mmHg
DLCO unc % pred: 49 %
FEF 25-75 POST: 1.45 L/s
FEF 25-75 Pre: 1.37 L/sec
FEF2575-%Change-Post: 6 %
FEF2575-%PRED-POST: 134 %
FEF2575-%Pred-Pre: 126 %
FEV1-%CHANGE-POST: 0 %
FEV1-%PRED-PRE: 116 %
FEV1-%Pred-Post: 117 %
FEV1-PRE: 1.74 L
FEV1-Post: 1.76 L
FEV1FVC-%Change-Post: 4 %
FEV1FVC-%PRED-PRE: 103 %
FEV6-%Change-Post: -2 %
FEV6-%PRED-PRE: 120 %
FEV6-%Pred-Post: 117 %
FEV6-PRE: 2.3 L
FEV6-Post: 2.24 L
FEV6FVC-%Change-Post: 0 %
FEV6FVC-%Pred-Post: 106 %
FEV6FVC-%Pred-Pre: 105 %
FVC-%Change-Post: -3 %
FVC-%PRED-POST: 109 %
FVC-%PRED-PRE: 113 %
FVC-POST: 2.24 L
FVC-PRE: 2.31 L
POST FEV1/FVC RATIO: 78 %
PRE FEV6/FVC RATIO: 100 %
Post FEV6/FVC ratio: 100 %
Pre FEV1/FVC ratio: 75 %
RV % pred: 87 %
RV: 1.95 L
TLC % pred: 98 %
TLC: 4.38 L

## 2015-07-29 MED ORDER — ALBUTEROL SULFATE (2.5 MG/3ML) 0.083% IN NEBU
2.5000 mg | INHALATION_SOLUTION | Freq: Once | RESPIRATORY_TRACT | Status: AC
  Administered 2015-07-29: 2.5 mg via RESPIRATORY_TRACT

## 2015-07-29 NOTE — Telephone Encounter (Signed)
Spoke to pt. I advised her that Dr. Brett Fairy reviewed her ONO on CPAP results, and said that it looked ok, but pt may still need O2 but wants Dr. Joylene Draft to make that decision and order the oxygen if warranted. Pt verbalized understanding of results. Pt had no questions at this time but was encouraged to call back if questions arise.

## 2015-07-31 ENCOUNTER — Other Ambulatory Visit: Payer: Self-pay | Admitting: Nurse Practitioner

## 2015-07-31 ENCOUNTER — Encounter: Payer: Self-pay | Admitting: Nurse Practitioner

## 2015-07-31 ENCOUNTER — Ambulatory Visit (INDEPENDENT_AMBULATORY_CARE_PROVIDER_SITE_OTHER): Payer: Medicare HMO | Admitting: Nurse Practitioner

## 2015-07-31 VITALS — BP 120/78 | HR 96 | Ht 60.0 in | Wt 176.0 lb

## 2015-07-31 DIAGNOSIS — I4819 Other persistent atrial fibrillation: Secondary | ICD-10-CM

## 2015-07-31 DIAGNOSIS — R0600 Dyspnea, unspecified: Secondary | ICD-10-CM | POA: Insufficient documentation

## 2015-07-31 DIAGNOSIS — I5032 Chronic diastolic (congestive) heart failure: Secondary | ICD-10-CM | POA: Diagnosis not present

## 2015-07-31 DIAGNOSIS — R0609 Other forms of dyspnea: Secondary | ICD-10-CM

## 2015-07-31 DIAGNOSIS — I481 Persistent atrial fibrillation: Secondary | ICD-10-CM

## 2015-07-31 DIAGNOSIS — I2510 Atherosclerotic heart disease of native coronary artery without angina pectoris: Secondary | ICD-10-CM

## 2015-07-31 DIAGNOSIS — I272 Pulmonary hypertension, unspecified: Secondary | ICD-10-CM | POA: Insufficient documentation

## 2015-07-31 DIAGNOSIS — I119 Hypertensive heart disease without heart failure: Secondary | ICD-10-CM | POA: Insufficient documentation

## 2015-07-31 DIAGNOSIS — E7800 Pure hypercholesterolemia, unspecified: Secondary | ICD-10-CM | POA: Insufficient documentation

## 2015-07-31 LAB — CBC
HCT: 34.8 % — ABNORMAL LOW (ref 35.0–45.0)
HEMOGLOBIN: 11.5 g/dL — AB (ref 11.7–15.5)
MCH: 30.2 pg (ref 27.0–33.0)
MCHC: 33 g/dL (ref 32.0–36.0)
MCV: 91.3 fL (ref 80.0–100.0)
MPV: 10 fL (ref 7.5–12.5)
PLATELETS: 248 10*3/uL (ref 140–400)
RBC: 3.81 MIL/uL (ref 3.80–5.10)
RDW: 14 % (ref 11.0–15.0)
WBC: 6 10*3/uL (ref 3.8–10.8)

## 2015-07-31 LAB — BASIC METABOLIC PANEL
BUN: 14 mg/dL (ref 7–25)
CALCIUM: 9.2 mg/dL (ref 8.6–10.4)
CHLORIDE: 102 mmol/L (ref 98–110)
CO2: 25 mmol/L (ref 20–31)
CREATININE: 0.74 mg/dL (ref 0.60–0.88)
Glucose, Bld: 93 mg/dL (ref 65–99)
Potassium: 4.4 mmol/L (ref 3.5–5.3)
Sodium: 137 mmol/L (ref 135–146)

## 2015-07-31 MED ORDER — FUROSEMIDE 20 MG PO TABS: 20.0000 mg | ORAL_TABLET | Freq: Every day | ORAL | Status: DC

## 2015-07-31 NOTE — Patient Instructions (Signed)
Melinda Bayley, NP, has recommended making the following medication changes: 1. START Furosemide (Lasix) 20 mg - take 1 tablet by mouth every morning  Your physician recommends that you return for lab work [place].  Gerald Stabs recommends that you schedule a follow-up appointment in 2-3 weeks with Dr [place].  If you need a refill on your cardiac medications before your next appointment, please call your pharmacy.

## 2015-07-31 NOTE — Progress Notes (Signed)
Office Visit    Patient Name: Melinda [name] Date of Encounter: 07/31/2015  Primary Care Provider:  Jerlyn Ly, MD Primary Cardiologist:  P. Martinique, MD   Chief Complaint     82 year old female status post recent hospital patient for chest pain and new finding of atrial fibrillation, who presents for follow-up.  Past Medical History    Past Medical History  Diagnosis Date  . Hypercholesterolemia   . Acid reflux   . Depression   . Insomnia   . Premature atrial contractions   . Anxiety   . Hypertensive heart disease   . Cataract     surgery,bilateral  . Pyloric antral stenosis     in childhood,infancy  . Multifactorial gait disorder   . Sleep apnea with use of continuous positive airway pressure (CPAP) 10/19/2012    a. compliant w/ CPAP.  . Diabetes mellitus without complication ([place])   . Hx: UTI (urinary tract infection)   . Arthritis   . Constipation   . Family history of adverse reaction to anesthesia     mother always had n/v  . PAF (paroxysmal atrial fibrillation) ([place])     a. Dx 07/2015, CHA2DS2VASc = 6-->Eliquis.  . Non-obstructive CAD     a. 07/2015 Cath: LM 30-40d, LAD min irregs, LCX min irregs, RCA 30p, RA 17, RV 33/7, PA 42/19, PCWP 17.  . Dyspnea on exertion     a. 07/2015 Echo: EF 55-60%, no rwma, mild MR, mod dil LA/RA/RV, mod TR, triv PR, PASP 46mHg.  .[place]ulmonary hypertension ([place]     a. 07/2015 Echo: PASP 523mg.  . Osteoarthritis     a. 06/2015 s/p R total hip arthroplasty.   Past Surgical History  Procedure Laterality Date  . Abdominal hysterectomy  1970  . Transthoracic echocardiogram  07/23/2009    mild concentric LVH/mild mitral insufficiency/ moderate tricuspid innsufficiency/mild pulmonary HTN/EF- 55-60%  . Nm myoview ltd  11/30/2007    normal stress nuclear study/EF-83%  . Vaginal delivery    . Inner ear surgery Left     x 3  . Colonoscopy    . Laparoscopic salpingo oopherectomy    . Tonsillectomy    . Abdominal surgery     as a 11 20ay old baby  . Eye surgery Bilateral     cataract surgery with lens implant  . Total hip arthroplasty Right 06/26/2015    Procedure: RIGHT TOTAL HIP ARTHROPLASTY ANTERIOR APPROACH;  Surgeon: NaLeandrew KoyanagiMD;  [facility];  Laterality: Right;  . Cardiac catheterization N/A 07/14/2015    Procedure: Right/Left Heart Cath and Coronary Angiography;  Surgeon: MiSanda KleinMD;  [facility];  Service: Cardiovascular;  Laterality: N/A;    Allergies  Allergies  Allergen Reactions  . Phenobarbital Itching    History of Present Illness    [age]ear old female with a prior history of hypertension, hyperlipidemia, diabetes, sleep apnea, and obesity. She was recently seen in clinic in early May secondary to progressive dyspnea. Echocardiogram and stress testing or scheduled with a plan for close follow-up. She had recently undergone right total hip arthroplasty in April, it was felt the dyspnea may be at least partially related to deconditioning. Unfortunately, on May 8, she developed crushing left-sided chest pain that were different from previous symptoms prompting her to present to the emergency department. She was admitted and ruled out for MI. She was seen by cardiology with recommendations for right left heart catheterization. It was  also noted that she was in rate controlled A. Fib/flutter and would require oral anticoagulation. Echocardiogram showed normal LV function with a pulmonary artery systolic pressure of 50 mmHg. Right left heart catheterization showed mild nonobstructive CAD with mild pulmonary hypertension and mildly elevated right heart pressures. She was subsequent discharged home the symptoms were not felt to be cardiac in origin. She underwent pulmonary function testing as an outpatient and these showed good lung volumes with minimal obstruction and decreased diffusion capacity.  Since her discharge, she has had stable, chronic dyspnea  exertion, specifically occurring when walking up inclines or steps. She has not been having any recurrent chest pain. She has noted some swelling in her bilateral lower extremities extending up to the midcalf, which is somewhat more than she is generally accustomed to. She denies palpitations, PND, orthopnea, dizziness, syncope, or early satiety.  Home Medications    Prior to Admission medications   Medication Sig Start Date End Date Taking? Authorizing Provider  acetaminophen (TYLENOL) 650 MG CR tablet Take 650 mg by mouth every 8 (eight) hours as needed for pain.   Yes Historical Provider, MD  amLODipine (NORVASC) 5 MG tablet Take 5 mg by mouth daily.   Yes Historical Provider, MD  apixaban (ELIQUIS) 5 MG TABS tablet Take 1 tablet (5 mg total) by mouth 2 (two) times daily. 07/15/15  Yes Robbie Lis, MD  aspirin EC 325 MG tablet Take 1 tablet (325 mg total) by mouth 2 (two) times daily. 06/26/15  Yes Leandrew Koyanagi, MD  atorvastatin (LIPITOR) 40 MG tablet Take 40 mg by mouth daily. One tablet in am   Yes Historical Provider, MD  baclofen (LIORESAL) 10 MG tablet TAKE 1 TABLET BY MOUTH THREE TIMES DAILY AS NEEDED 07/14/15  Yes Monina C Medina-Vargas, NP  Blood Glucose Monitoring Suppl (ONE TOUCH ULTRA 2) W/DEVICE KIT Reported on 07/11/2015 06/21/13  Yes Historical Provider, MD  Calcium Carbonate-Vitamin D3 (CALCIUM 600+D3) 600-400 MG-UNIT TABS Take 1 tablet by mouth daily.   Yes Historical Provider, MD  cholecalciferol (VITAMIN D) 1000 UNITS tablet Take 1,000 Units by mouth daily. D3   Yes Historical Provider, MD  desipramine (NORPRAMIN) 25 MG tablet Take 25 mg by mouth daily.   Yes Historical Provider, MD  glucosamine-chondroitin 500-400 MG tablet Take 1 tablet by mouth every other day.    Yes Historical Provider, MD  glucose blood (ACCU-CHEK ACTIVE STRIPS) test strip 1 each by Other route as needed for other. Reported on 07/11/2015   Yes Historical Provider, MD  linaclotide (LINZESS) 145 MCG CAPS capsule  Take 145 mcg by mouth daily before breakfast.    Yes Historical Provider, MD  lisinopril (PRINIVIL,ZESTRIL) 20 MG tablet Take 20 mg by mouth daily.   Yes Historical Provider, MD  metoprolol (LOPRESSOR) 50 MG tablet Take 25 mg by mouth 2 (two) times daily. Take 1/2 tablet by mouth twice daily   Yes Historical Provider, MD  omeprazole (PRILOSEC) 20 MG capsule Take 20 mg by mouth daily. One tablet daily in mornings   Yes Historical Provider, MD  [facility] DELICA LANCETS 74M MISC Reported on 07/11/2015 06/21/13  Yes Historical Provider, MD  polyethylene glycol (MIRALAX / GLYCOLAX) packet Take 17 g by mouth 2 (two) times daily.   Yes Historical Provider, MD  Protein (PROCEL 100) POWD Take 1 scoop by mouth 2 (two) times daily.   Yes Historical Provider, MD  vitamin B-12 (CYANOCOBALAMIN) 500 MCG tablet Take 1,000 mcg by mouth daily.    Yes Historical  Provider, MD  furosemide (LASIX) 20 MG tablet Take 1 tablet (20 mg total) by mouth daily. 07/31/15   Rogelia Mire, NP    Review of Systems    As above, she has chronic, stable dyspnea on exertion. She has not been having any chest pain, palpitations, PND, orthopnea, dizziness, syncope, or early satiety. She has noted mild bilateral lower extremity edema.  All other systems reviewed and are otherwise negative except as noted above.  Physical Exam    VS:  BP 120/78 mmHg  Pulse 81  Ht 5' (1.524 m)  Wt 176 lb (79.833 kg)  BMI 34.37 kg/m2 , BMI Body mass index is 34.37 kg/(m^2). GEN: Well nourished, well developed, in no acute distress. HEENT: normal. Neck: Supple, no JVD, carotid bruits, or masses. Cardiac: Irregularly irregular, no murmurs, rubs, or gallops. No clubbing, cyanosis, 1+ bilateral lower extremity edema.  Radials/DP/PT 2+ and equal bilaterally.  Respiratory:  Respirations regular and unlabored, clear to auscultation bilaterally. GI: Soft, nontender, nondistended, BS + x 4. MS: no deformity or atrophy. Skin: warm and dry, no rash. Neuro:   Strength and sensation are intact. Psych: Normal affect.  Accessory Clinical Findings    ECG - Coarse atrial fibrillation, 81, leftward axis, poor R-wave progression, no acute ST or T changes.  Assessment & Plan    1.  Persistent atrial fibrillation/flutter: This was recently found during hospitalization earlier this month and has been well rate controlled on beta blocker therapy. She does not express palpitations. She is chronic dyspnea on exertion over the past few years which was worse over the past month or more which prompted initial evaluation by Dr. Martinique earlier this month. It is difficult to know to what extent rate controlled atrial fibrillation is contributing to her symptoms. We did discuss the role of cardioversion following 4 weeks of therapeutic anticoagulation and Ms. Idler is possibly interested in pursuing this but would like discuss it with Dr. Martinique. She has been on eliquis and tolerating this well. I will check a basic metabolic panel and CBC today to ensure stability and arrange for follow-up with Dr. Martinique in approximately 3 weeks, at which point she'll have been on eliquis for a month and consideration can be given toward cardioversion if she remains in A. Fib.  2. Chronic diastolic CHF/dyspnea on exertion: Patient has been having mild lower extremity edema which is more than what she is accustomed to having. She did have mild elevation of her pulmonary capillary wedge pressure on right heart catheterization. I will add Lasix 20 mg daily to her regimen. I'm checking a basic metabolic panel today. Her heart rate and blood pressure was controlled but as above, it is difficult to know what role the loss of atrial kick is playing in her symptoms. She reads a beta blocker, ACE inhibitor, and calcium channel blocker therapy.  3. Hypertensive heart disease: Stable on calcium channel blocker, beta blocker, and ACE inhibitor therapy.  4. Nonobstructive coronary artery disease:  Status post recent catheterization. She was placed on a statin following catheterization in the setting of mild disease. She will need follow-up lipids and LFTs in approximately 6 weeks.  5. Disposition: Follow-up in approximately 3 weeks for consideration of cardioversion. I will check a CBC and basic met about panel today since she was recently started on eliquis and I am adding low-dose Lasix.   Murray Hodgkins, NP 07/31/2015, 12:27 PM

## 2015-10-28 NOTE — Progress Notes (Signed)
Cardiology Office Note   Date:  10/29/2015   ID:  Melinda [name], DOB Jul 26, 1931, MRN [id number]  PCP:  Jerlyn Ly, MD  Cardiologist:   Jerrik Housholder Martinique, MD   Chief Complaint  Patient presents with  . Shortness of Breath  . Atrial Flutter      History of Present Illness: Melinda [name] is a 82 y.o. female who presents for follow up of Afib and dyspnea.  She was seen by me in 2009. At that time she had a normal stress Myoview study. Echo showed mild LVH with normal EF. Mild pulmonary HTN. She was seen in early May with symptoms of increased dyspnea on exertion. This has increased significantly over the past year. No cough, wheezing, edema. Weight has been stable. She has noted intermittent sharp pains across her chest. On one occasion she walked to her mailbox and developed a heavy weight on her chest. She was s/p a right THR. She does have a history of OSA and is followed by Dr. Brett Fairy and using CPAP. She does stay fatigued a lot.  After my visit in early May she was scheduled for a repeat Echo and a Myoview study. Before these could be done she was admitted May 7-9 with acute chest pain. She was found to be in Atrial flutter with controlled rate (new since April). She ruled out for MI. Echo showed normal LV function with mild LVH, mild MR, moderate LA, RA, and RV enlargement with pulmonary HTN estimated at 50 mm Hg. She underwent right and left heart cath that showed nonobstructive CAD, mild pulmonary HTN- more consistent with pulmonary arterial hypertension. CT of the chest was normal with no evidence of PE. She had subsequent PFTs as an outpatient showing normal volumes with mild obstruction and decreased diffusion capacity. She was started on diuretic therapy and anticoagulated. She is being considered for DCCV.   On follow up today she states she is doing better. Breathing is some improved. She was fitted with a new CPAP mask and is using oxygen at night. She was unable to  tolerate lasix due to marked urticaria. Edema is better. No bleeding problems. She did have a fall in July injuring her right shoulder. Is going to be evaluated by ortho. Cranial CT negative after fall.  Past Medical History:  Diagnosis Date  . Acid reflux   . Anxiety   . Arthritis   . Cataract    surgery,bilateral  . Constipation   . Depression   . Diabetes mellitus without complication ([place])   . Dyspnea on exertion    a. 07/2015 Echo: EF 55-60%, no rwma, mild MR, mod dil LA/RA/RV, mod TR, triv PR, PASP 14mHg.  . Family history of adverse reaction to anesthesia    mother always had n/v  . Hx: UTI (urinary tract infection)   . Hypercholesterolemia   . Hypertensive heart disease   . Insomnia   . Multifactorial gait disorder   . Non-obstructive CAD    a. 07/2015 Cath: LM 30-40d, LAD min irregs, LCX min irregs, RCA 30p, RA 17, RV 33/7, PA 42/19, PCWP 17.  . Osteoarthritis    a. 06/2015 s/p R total hip arthroplasty.  .Marland KitchenPAF (paroxysmal atrial fibrillation) ([place]    a. Dx 07/2015, CHA2DS2VASc = 6-->Eliquis.  . Premature atrial contractions   . Pulmonary hypertension ([place]    a. 07/2015 Echo: PASP 518mg.  . Marland Kitchenyloric antral stenosis    in childhood,infancy  . Sleep apnea with use of  continuous positive airway pressure (CPAP) 10/19/2012   a. compliant w/ CPAP.    Past Surgical History:  Procedure Laterality Date  . ABDOMINAL HYSTERECTOMY  1970  . ABDOMINAL SURGERY     as a 68 day old baby  . CARDIAC CATHETERIZATION N/A 07/14/2015   Procedure: Right/Left Heart Cath and Coronary Angiography;  Surgeon: Sanda Klein, MD;  Location: [facility];  Service: Cardiovascular;  Laterality: N/A;  . COLONOSCOPY    . EYE SURGERY Bilateral    cataract surgery with lens implant  . INNER EAR SURGERY Left    x 3  . LAPAROSCOPIC SALPINGO OOPHERECTOMY    . NM MYOVIEW LTD  11/30/2007   normal stress nuclear study/EF-83%  . TONSILLECTOMY    . TOTAL HIP ARTHROPLASTY Right 06/26/2015   Procedure:  RIGHT TOTAL HIP ARTHROPLASTY ANTERIOR APPROACH;  Surgeon: Leandrew Koyanagi, MD;  Location: [facility];  Service: Orthopedics;  Laterality: Right;  . TRANSTHORACIC ECHOCARDIOGRAM  07/23/2009   mild concentric LVH/mild mitral insufficiency/ moderate tricuspid innsufficiency/mild pulmonary HTN/EF- 55-60%  . VAGINAL DELIVERY       Current Outpatient Prescriptions  Medication Sig Dispense Refill  . acetaminophen (TYLENOL) 650 MG CR tablet Take 650 mg by mouth every 8 (eight) hours as needed for pain.    [name] amLODipine (NORVASC) 5 MG tablet Take 5 mg by mouth daily.    [name] apixaban (ELIQUIS) 5 MG TABS tablet Take 1 tablet (5 mg total) by mouth 2 (two) times daily. 60 tablet 0  . aspirin EC 325 MG tablet Take 1 tablet (325 mg total) by mouth 2 (two) times daily. 84 tablet 0  . atorvastatin (LIPITOR) 40 MG tablet Take 40 mg by mouth daily. One tablet in am    . baclofen (LIORESAL) 10 MG tablet TAKE 1 TABLET BY MOUTH THREE TIMES DAILY AS NEEDED 270 tablet 0  . Blood Glucose Monitoring Suppl (ONE TOUCH ULTRA 2) W/DEVICE KIT Reported on 07/11/2015    . Calcium Carbonate-Vitamin D3 (CALCIUM 600+D3) 600-400 MG-UNIT TABS Take 1 tablet by mouth daily.    . cholecalciferol (VITAMIN D) 1000 UNITS tablet Take 1,000 Units by mouth daily. D3    . desipramine (NORPRAMIN) 25 MG tablet Take 25 mg by mouth daily.    [name] glucosamine-chondroitin 500-400 MG tablet Take 1 tablet by mouth every other day.     [name] glucose blood (ACCU-CHEK ACTIVE STRIPS) test strip 1 each by Other route as needed for other. Reported on 07/11/2015    . linaclotide (LINZESS) 145 MCG CAPS capsule Take 145 mcg by mouth daily before breakfast.     . metoprolol (LOPRESSOR) 50 MG tablet Take 25 mg by mouth 2 (two) times daily. Take 1/2 tablet by mouth twice daily    . omeprazole (PRILOSEC) 20 MG capsule Take 20 mg by mouth daily. One tablet daily in mornings    . ONETOUCH DELICA LANCETS 70Y MISC Reported on 07/11/2015    . polyethylene glycol (MIRALAX / GLYCOLAX)  packet Take 17 g by mouth 2 (two) times daily.    . vitamin B-12 (CYANOCOBALAMIN) 500 MCG tablet Take 1,000 mcg by mouth daily.      No current facility-administered medications for this visit.     Allergies:   Lasix [furosemide] and Phenobarbital    Social History:  The patient  reports that she has never smoked. She has never used smokeless tobacco. She reports that she does not drink alcohol or use drugs.   Family History:  The patient's family  history includes Cancer in her mother; Diabetes in her sister; Heart attack in her father; Heart failure in her mother and sister; Hypertension in her sister.    ROS:  Please see the history of present illness.   Otherwise, review of systems are positive for none.   All other systems are reviewed and negative.    PHYSICAL EXAM: VS:  BP 114/76   Pulse 97   Ht 5' (1.524 m)   Wt 167 lb 3.2 oz (75.8 kg)   BMI 32.65 kg/m  , BMI Body mass index is 32.65 kg/m. GEN: Well nourished, well developed, in no acute distress she is seen in a wheelchair. HEENT: normal  Neck: no JVD, carotid bruits, or masses Cardiac: RRR; no murmurs, rubs, or gallops Respiratory:  clear to auscultation bilaterally, normal work of breathing GI: soft, nontender, nondistended, + BS MS: no deformity or atrophy  Skin: warm and dry, no rash Ext: trace edema Neuro:  Strength and sensation are intact Psych: euthymic mood, full affect   EKG:  EKG is  ordered today. Atrial flutter with rate 94. LAD, possible old septal infarct. I have personally reviewed and interpreted this study.    Recent Labs: 07/14/2015: ALT 14; B Natriuretic Peptide 640.5 07/31/2015: BUN 14; Creat 0.74; Hemoglobin 11.5; Platelets 248; Potassium 4.4; Sodium 137    Lipid Panel No results found for: CHOL, TRIG, HDL, CHOLHDL, VLDL, LDLCALC, LDLDIRECT    Wt Readings from Last 3 Encounters:  10/29/15 167 lb 3.2 oz (75.8 kg)  07/31/15 176 lb (79.8 kg)  07/22/15 170 lb (77.1 kg)      Other  studies Reviewed: Additional studies/ records that were reviewed today include: hospital records from May as noted above.   ASSESSMENT AND PLAN:  1.  Dyspnea on exertion. Based on extensive evaluation this appears to be more related to OSA, deconditioning and impaired diffusion capacity. She has mild pulmonary arterial HTN.  I don't think this is related to her arrhythmia since symptoms predate development of atrial flutter. If symptoms worsen I would recommend formal pulmonary evaluation.  2. Atypical chest pain. Nonobstructive CAD by cath.   3. OSA on CPAP  4. Atrial flutter- persistent. Now on Eliquis > 4 weeks. I have recommended attempt at DCCV. If symptoms improve it may be important to maintain NSR. If no change in symptoms then if Afib/flutter recurs I would just manage with rate control and anticoagulation. At least moderate risk of recurrence with biatrial enlargement.   5. HTN  6. Hyperlipidemia.    Current medicines are reviewed at length with the patient today.  The patient does not have concerns regarding medicines.  The following changes have been made:  no change  Labs/ tests ordered today include:  No orders of the defined types were placed in this encounter.    Disposition:  Arrange elective DCCV. Continue current therapy. If should surgery needed will need to wait at least 4 weeks post DCCV.  Signed, Chayna Surratt Martinique, MD  10/29/2015 8:32 AM    [facility] [address] [place], [name], [address] Phone [phone], Fax [phone]

## 2015-10-29 ENCOUNTER — Encounter: Payer: Self-pay | Admitting: Cardiology

## 2015-10-29 ENCOUNTER — Other Ambulatory Visit: Payer: Self-pay | Admitting: Cardiology

## 2015-10-29 ENCOUNTER — Ambulatory Visit (INDEPENDENT_AMBULATORY_CARE_PROVIDER_SITE_OTHER): Payer: Medicare HMO | Admitting: Cardiology

## 2015-10-29 VITALS — BP 114/76 | HR 94 | Ht 60.0 in | Wt 167.2 lb

## 2015-10-29 DIAGNOSIS — E785 Hyperlipidemia, unspecified: Secondary | ICD-10-CM | POA: Diagnosis not present

## 2015-10-29 DIAGNOSIS — G4733 Obstructive sleep apnea (adult) (pediatric): Secondary | ICD-10-CM

## 2015-10-29 DIAGNOSIS — I2510 Atherosclerotic heart disease of native coronary artery without angina pectoris: Secondary | ICD-10-CM

## 2015-10-29 DIAGNOSIS — G4730 Sleep apnea, unspecified: Secondary | ICD-10-CM

## 2015-10-29 DIAGNOSIS — I119 Hypertensive heart disease without heart failure: Secondary | ICD-10-CM | POA: Diagnosis not present

## 2015-10-29 DIAGNOSIS — I4892 Unspecified atrial flutter: Secondary | ICD-10-CM

## 2015-10-29 DIAGNOSIS — I272 Other secondary pulmonary hypertension: Secondary | ICD-10-CM | POA: Diagnosis not present

## 2015-10-29 DIAGNOSIS — I10 Essential (primary) hypertension: Secondary | ICD-10-CM | POA: Diagnosis not present

## 2015-10-29 DIAGNOSIS — R0600 Dyspnea, unspecified: Secondary | ICD-10-CM

## 2015-10-29 DIAGNOSIS — I2721 Secondary pulmonary arterial hypertension: Secondary | ICD-10-CM

## 2015-10-29 LAB — BASIC METABOLIC PANEL
BUN: 21 mg/dL (ref 7–25)
CHLORIDE: 103 mmol/L (ref 98–110)
CO2: 27 mmol/L (ref 20–31)
Calcium: 9.4 mg/dL (ref 8.6–10.4)
Creat: 0.91 mg/dL — ABNORMAL HIGH (ref 0.60–0.88)
GLUCOSE: 113 mg/dL — AB (ref 65–99)
POTASSIUM: 4.3 mmol/L (ref 3.5–5.3)
SODIUM: 138 mmol/L (ref 135–146)

## 2015-10-29 LAB — CBC WITH DIFFERENTIAL/PLATELET
BASOS PCT: 1 %
Basophils Absolute: 57 cells/uL (ref 0–200)
EOS ABS: 114 {cells}/uL (ref 15–500)
Eosinophils Relative: 2 %
HEMATOCRIT: 41.7 % (ref 35.0–45.0)
HEMOGLOBIN: 13.5 g/dL (ref 11.7–15.5)
LYMPHS ABS: 1368 {cells}/uL (ref 850–3900)
LYMPHS PCT: 24 %
MCH: 28.7 pg (ref 27.0–33.0)
MCHC: 32.4 g/dL (ref 32.0–36.0)
MCV: 88.5 fL (ref 80.0–100.0)
MONO ABS: 456 {cells}/uL (ref 200–950)
MPV: 9.8 fL (ref 7.5–12.5)
Monocytes Relative: 8 %
NEUTROS PCT: 65 %
Neutro Abs: 3705 cells/uL (ref 1500–7800)
Platelets: 222 10*3/uL (ref 140–400)
RBC: 4.71 MIL/uL (ref 3.80–5.10)
RDW: 16 % — AB (ref 11.0–15.0)
WBC: 5.7 10*3/uL (ref 3.8–10.8)

## 2015-10-29 NOTE — Patient Instructions (Signed)
We will schedule you for an elective cardioversion  Continue your current therapy  Avoid salt and keep feet elevated when possible.

## 2015-10-30 LAB — PROTIME-INR
INR: 1.1
Prothrombin Time: 12 s — ABNORMAL HIGH (ref 9.0–11.5)

## 2015-11-04 ENCOUNTER — Encounter (HOSPITAL_COMMUNITY): Payer: Self-pay | Admitting: *Deleted

## 2015-11-04 ENCOUNTER — Ambulatory Visit (HOSPITAL_COMMUNITY): Payer: Medicare HMO | Admitting: Anesthesiology

## 2015-11-04 ENCOUNTER — Ambulatory Visit (HOSPITAL_COMMUNITY)
Admission: RE | Admit: 2015-11-04 | Discharge: 2015-11-04 | Disposition: A | Discharge status: Home / Self Care | Payer: Medicare HMO | Source: Ambulatory Visit | Attending: Cardiology | Admitting: Cardiology

## 2015-11-04 ENCOUNTER — Encounter (HOSPITAL_COMMUNITY)
Admission: RE | Disposition: A | Discharge status: Home / Self Care | Payer: Self-pay | Source: Ambulatory Visit | Attending: Cardiology

## 2015-11-04 DIAGNOSIS — Z79899 Other long term (current) drug therapy: Secondary | ICD-10-CM | POA: Diagnosis not present

## 2015-11-04 DIAGNOSIS — I4892 Unspecified atrial flutter: Secondary | ICD-10-CM | POA: Insufficient documentation

## 2015-11-04 DIAGNOSIS — F329 Major depressive disorder, single episode, unspecified: Secondary | ICD-10-CM | POA: Diagnosis not present

## 2015-11-04 DIAGNOSIS — Z96641 Presence of right artificial hip joint: Secondary | ICD-10-CM | POA: Insufficient documentation

## 2015-11-04 DIAGNOSIS — I480 Paroxysmal atrial fibrillation: Secondary | ICD-10-CM | POA: Diagnosis not present

## 2015-11-04 DIAGNOSIS — Z7982 Long term (current) use of aspirin: Secondary | ICD-10-CM | POA: Insufficient documentation

## 2015-11-04 DIAGNOSIS — K5900 Constipation, unspecified: Secondary | ICD-10-CM | POA: Diagnosis not present

## 2015-11-04 DIAGNOSIS — K219 Gastro-esophageal reflux disease without esophagitis: Secondary | ICD-10-CM | POA: Diagnosis not present

## 2015-11-04 DIAGNOSIS — I119 Hypertensive heart disease without heart failure: Secondary | ICD-10-CM | POA: Diagnosis not present

## 2015-11-04 DIAGNOSIS — Z7901 Long term (current) use of anticoagulants: Secondary | ICD-10-CM | POA: Diagnosis not present

## 2015-11-04 DIAGNOSIS — I2510 Atherosclerotic heart disease of native coronary artery without angina pectoris: Secondary | ICD-10-CM | POA: Diagnosis not present

## 2015-11-04 DIAGNOSIS — E119 Type 2 diabetes mellitus without complications: Secondary | ICD-10-CM | POA: Insufficient documentation

## 2015-11-04 DIAGNOSIS — E785 Hyperlipidemia, unspecified: Secondary | ICD-10-CM | POA: Insufficient documentation

## 2015-11-04 DIAGNOSIS — G4730 Sleep apnea, unspecified: Secondary | ICD-10-CM | POA: Diagnosis not present

## 2015-11-04 DIAGNOSIS — E7800 Pure hypercholesterolemia, unspecified: Secondary | ICD-10-CM | POA: Insufficient documentation

## 2015-11-04 DIAGNOSIS — I4891 Unspecified atrial fibrillation: Secondary | ICD-10-CM | POA: Diagnosis present

## 2015-11-04 HISTORY — PX: CARDIOVERSION: SHX1299

## 2015-11-04 SURGERY — CARDIOVERSION
Anesthesia: Monitor Anesthesia Care

## 2015-11-04 MED ORDER — LIDOCAINE 2% (20 MG/ML) 5 ML SYRINGE
INTRAMUSCULAR | Status: DC | PRN
  Administered 2015-11-04: 80 mg via INTRAVENOUS

## 2015-11-04 MED ORDER — PROPOFOL 10 MG/ML IV BOLUS
INTRAVENOUS | Status: DC | PRN
  Administered 2015-11-04: 60 mg via INTRAVENOUS

## 2015-11-04 MED ORDER — SODIUM CHLORIDE 0.9 % IV SOLN
INTRAVENOUS | Status: DC
  Administered 2015-11-04: 13:00:00 via INTRAVENOUS

## 2015-11-04 NOTE — H&P (Signed)
Melinda [name]  10/29/2015 8:15 AM  Office Visit  MRN:  [id number]  Description: Female DOB: 12/26/1931 Provider: Peter M Martinique, MD Department: [facility]  Vitals   BP  114/76   Pulse  94   Ht  5' (1.524 m)   Wt  167 lb 3.2 oz (75.8 kg)   BMI  32.65 kg/m      Vitals History  Progress Notes   Peter M Martinique, MD at 10/29/2015 8:15 AM   Status: Signed  Expand All Collapse All       Cardiology Office Note   Date:  10/29/2015   ID:  Melinda [name], DOB 02/02/1932, MRN [id number]  PCP:  Jerlyn Ly, MD              Cardiologist:   Peter Martinique, MD      Chief Complaint  Patient presents with  . Shortness of Breath  . Atrial Flutter      History of Present Illness: Melinda [name] is a 82 y.o. female who presents for follow up of Afib and dyspnea.  She was seen by me in 2009. At that time she had a normal stress Myoview study. Echo showed mild LVH with normal EF. Mild pulmonary HTN. She was seen in early May with symptoms of increased dyspnea on exertion. This has increased significantly over the past year. No cough, wheezing, edema. Weight has been stable. She has noted intermittent sharp pains across her chest. On one occasion she walked to her mailbox and developed a heavy weight on her chest. She was s/p a right THR. She does have a history of OSA and is followed by Dr. Brett Fairy and using CPAP. She does stay fatigued a lot.  After my visit in early May she was scheduled for a repeat Echo and a Myoview study. Before these could be done she was admitted May 7-9 with acute chest pain. She was found to be in Atrial flutter with controlled rate (new since April). She ruled out for MI. Echo showed normal LV function with mild LVH, mild MR, moderate LA, RA, and RV enlargement with pulmonary HTN estimated at 50 mm Hg. She underwent right and left heart cath that showed nonobstructive CAD, mild pulmonary HTN- more consistent with pulmonary arterial  hypertension. CT of the chest was normal with no evidence of PE. She had subsequent PFTs as an outpatient showing normal volumes with mild obstruction and decreased diffusion capacity. She was started on diuretic therapy and anticoagulated. She is being considered for DCCV.   On follow up today she states she is doing better. Breathing is some improved. She was fitted with a new CPAP mask and is using oxygen at night. She was unable to tolerate lasix due to marked urticaria. Edema is better. No bleeding problems. She did have a fall in July injuring her right shoulder. Is going to be evaluated by ortho. Cranial CT negative after fall.      Past Medical History:  Diagnosis Date  . Acid reflux   . Anxiety   . Arthritis   . Cataract    surgery,bilateral  . Constipation   . Depression   . Diabetes mellitus without complication ([place])   . Dyspnea on exertion    a. 07/2015 Echo: EF 55-60%, no rwma, mild MR, mod dil LA/RA/RV, mod TR, triv PR, PASP 73mHg.  . Family history of adverse reaction to anesthesia    mother always had n/v  . Hx: UTI (urinary tract infection)   .  Hypercholesterolemia   . Hypertensive heart disease   . Insomnia   . Multifactorial gait disorder   . Non-obstructive CAD    a. 07/2015 Cath: LM 30-40d, LAD min irregs, LCX min irregs, RCA 30p, RA 17, RV 33/7, PA 42/19, PCWP 17.  . Osteoarthritis    a. 06/2015 s/p R total hip arthroplasty.  [name] PAF (paroxysmal atrial fibrillation) ([place])    a. Dx 07/2015, CHA2DS2VASc = 6-->Eliquis.  . Premature atrial contractions   . Pulmonary hypertension ([place])    a. 07/2015 Echo: PASP 53mHg.  .Marland KitchenPyloric antral stenosis    in childhood,infancy  . Sleep apnea with use of continuous positive airway pressure (CPAP) 10/19/2012   a. compliant w/ CPAP.         Past Surgical History:  Procedure Laterality Date  . ABDOMINAL HYSTERECTOMY  1970  . ABDOMINAL SURGERY     as a 957day old baby  . CARDIAC  CATHETERIZATION N/A 07/14/2015   Procedure: Right/Left Heart Cath and Coronary Angiography;  Surgeon: MSanda Klein MD;  [facility];  Service: Cardiovascular;  Laterality: N/A;  . COLONOSCOPY    . EYE SURGERY Bilateral    cataract surgery with lens implant  . INNER EAR SURGERY Left    x 3  . LAPAROSCOPIC SALPINGO OOPHERECTOMY    . NM MYOVIEW LTD  11/30/2007   normal stress nuclear study/EF-83%  . TONSILLECTOMY    . TOTAL HIP ARTHROPLASTY Right 06/26/2015   Procedure: RIGHT TOTAL HIP ARTHROPLASTY ANTERIOR APPROACH;  Surgeon: NLeandrew Koyanagi MD;  Location: [facility]  Service: Orthopedics;  Laterality: Right;  . TRANSTHORACIC ECHOCARDIOGRAM  07/23/2009   mild concentric LVH/mild mitral insufficiency/ moderate tricuspid innsufficiency/mild pulmonary HTN/EF- 55-60%  . VAGINAL DELIVERY             Current Outpatient Prescriptions  Medication Sig Dispense Refill  . acetaminophen (TYLENOL) 650 MG CR tablet Take 650 mg by mouth every 8 (eight) hours as needed for pain.    .Marland KitchenamLODipine (NORVASC) 5 MG tablet Take 5 mg by mouth daily.    .Marland Kitchenapixaban (ELIQUIS) 5 MG TABS tablet Take 1 tablet (5 mg total) by mouth 2 (two) times daily. 60 tablet 0  . aspirin EC 325 MG tablet Take 1 tablet (325 mg total) by mouth 2 (two) times daily. 84 tablet 0  . atorvastatin (LIPITOR) 40 MG tablet Take 40 mg by mouth daily. One tablet in am    . baclofen (LIORESAL) 10 MG tablet TAKE 1 TABLET BY MOUTH THREE TIMES DAILY AS NEEDED 270 tablet 0  . Blood Glucose Monitoring Suppl (ONE TOUCH ULTRA 2) W/DEVICE KIT Reported on 07/11/2015    . Calcium Carbonate-Vitamin D3 (CALCIUM 600+D3) 600-400 MG-UNIT TABS Take 1 tablet by mouth daily.    . cholecalciferol (VITAMIN D) 1000 UNITS tablet Take 1,000 Units by mouth daily. D3    . desipramine (NORPRAMIN) 25 MG tablet Take 25 mg by mouth daily.    .Marland Kitchenglucosamine-chondroitin 500-400 MG tablet Take 1 tablet by mouth every other day.       .Marland Kitchenglucose blood (ACCU-CHEK ACTIVE STRIPS) test strip 1 each by Other route as needed for other. Reported on 07/11/2015    . linaclotide (LINZESS) 145 MCG CAPS capsule Take 145 mcg by mouth daily before breakfast.     . metoprolol (LOPRESSOR) 50 MG tablet Take 25 mg by mouth 2 (two) times daily. Take 1/2 tablet by mouth twice daily    . omeprazole (PRILOSEC) 20  MG capsule Take 20 mg by mouth daily. One tablet daily in mornings    . ONETOUCH DELICA LANCETS 57X MISC Reported on 07/11/2015    . polyethylene glycol (MIRALAX / GLYCOLAX) packet Take 17 g by mouth 2 (two) times daily.    . vitamin B-12 (CYANOCOBALAMIN) 500 MCG tablet Take 1,000 mcg by mouth daily.      No current facility-administered medications for this visit.     Allergies:   Lasix [furosemide] and Phenobarbital    Social History:  The patient  reports that she has never smoked. She has never used smokeless tobacco. She reports that she does not drink alcohol or use drugs.   Family History:  The patient's family history includes Cancer in her mother; Diabetes in her sister; Heart attack in her father; Heart failure in her mother and sister; Hypertension in her sister.    ROS:  Please see the history of present illness.   Otherwise, review of systems are positive for none.   All other systems are reviewed and negative.    PHYSICAL EXAM: VS:  BP 114/76   Pulse 97   Ht 5' (1.524 m)   Wt 167 lb 3.2 oz (75.8 kg)   BMI 32.65 kg/m  , BMI Body mass index is 32.65 kg/m. GEN: Well nourished, well developed, in no acute distress she is seen in a wheelchair. HEENT: normal  Neck: no JVD, carotid bruits, or masses Cardiac: RRR; no murmurs, rubs, or gallops Respiratory:  clear to auscultation bilaterally, normal work of breathing GI: soft, nontender, nondistended, + BS MS: no deformity or atrophy  Skin: warm and dry, no rash Ext: trace edema Neuro:  Strength and sensation are intact Psych: euthymic mood,  full affect   EKG:  EKG is  ordered today. Atrial flutter with rate 94. LAD, possible old septal infarct. I have personally reviewed and interpreted this study.    Recent Labs: 07/14/2015: ALT 14; B Natriuretic Peptide 640.5 07/31/2015: BUN 14; Creat 0.74; Hemoglobin 11.5; Platelets 248; Potassium 4.4; Sodium 137    Lipid Panel Labs (Brief)  No results found for: CHOL, TRIG, HDL, CHOLHDL, VLDL, LDLCALC, LDLDIRECT         Wt Readings from Last 3 Encounters:  10/29/15 167 lb 3.2 oz (75.8 kg)  07/31/15 176 lb (79.8 kg)  07/22/15 170 lb (77.1 kg)      Other studies Reviewed: Additional studies/ records that were reviewed today include: hospital records from May as noted above.   ASSESSMENT AND PLAN:  1.  Dyspnea on exertion. Based on extensive evaluation this appears to be more related to OSA, deconditioning and impaired diffusion capacity. She has mild pulmonary arterial HTN.  I don't think this is related to her arrhythmia since symptoms predate development of atrial flutter. If symptoms worsen I would recommend formal pulmonary evaluation.  2. Atypical chest pain. Nonobstructive CAD by cath.   3. OSA on CPAP  4. Atrial flutter- persistent. Now on Eliquis > 4 weeks. I have recommended attempt at DCCV. If symptoms improve it may be important to maintain NSR. If no change in symptoms then if Afib/flutter recurs I would just manage with rate control and anticoagulation. At least moderate risk of recurrence with biatrial enlargement.   5. HTN  6. Hyperlipidemia.    Current medicines are reviewed at length with the patient today.  The patient does not have concerns regarding medicines.  The following changes have been made:  no change  Labs/ tests ordered today include:  No orders of the defined types were placed in this encounter.    Disposition:  Arrange elective DCCV. Continue current therapy. If should surgery needed will need to wait at least 4  weeks post DCCV.  Signed, Peter Martinique, MD  10/29/2015 8:32 AM    [facility] [address] [place], [name], [address] Phone [phone], Fax [phone]     For DCCV; taking apixaban Kirk Ruths

## 2015-11-04 NOTE — Anesthesia Preprocedure Evaluation (Addendum)
Anesthesia Evaluation  Patient identified by MRN, date of birth, ID band Patient awake    Reviewed: Allergy & Precautions, NPO status , Patient's Chart, lab work & pertinent test results, reviewed documented beta blocker date and time   History of Anesthesia Complications (+) Family history of anesthesia reaction  Airway Mallampati: III  TM Distance: >3 FB Neck ROM: Full    Dental  (+) Teeth Intact, Dental Advisory Given   Pulmonary shortness of breath, sleep apnea and Continuous Positive Airway Pressure Ventilation ,    Pulmonary exam normal breath sounds clear to auscultation       Cardiovascular hypertension, Pt. on medications and Pt. on home beta blockers + CAD (non-obstructive) and + DOE  + dysrhythmias (PACs) Atrial Fibrillation  Rhythm:Irregular Rate:Normal  Pulmonary HTN  TTE 07/15/2015: Study Conclusions  - Left ventricle: The cavity size was normal. There was mild   concentric hypertrophy. Systolic function was normal. The   estimated ejection fraction was in the range of 55% to 60%. Wall   motion was normal; there were no regional wall motion   abnormalities. - Aortic valve: Transvalvular velocity was within the normal range.   There was no stenosis. There was no regurgitation. - Mitral valve: Calcified annulus. Mildly thickened leaflets .   There was mild regurgitation. - Left atrium: The atrium was moderately dilated. - Right ventricle: The cavity size was moderately dilated. Wall   thickness was normal. - Right atrium: The atrium was moderately dilated. - Tricuspid valve: There was moderate regurgitation. - Pulmonic valve: There was trivial regurgitation. - Pulmonary arteries: Systolic pressure was moderately increased.   PA peak pressure: 50 mm Hg (S). - Inferior vena cava: The vessel was dilated. The respirophasic   diameter changes were blunted (< 50%), consistent with elevated   central venous  pressure. - Pericardium, extracardiac: There was no pericardial effusion.  RHC/LHC 07/14/2015: IMPRESSIONS:  Mild coronary atherosclerosis, not hemodynamically significant. Minimally elevated LVEDP at rest, consistent with mild diastolic dysfunction. Mild pulmonary artery hypertension. Transpulmonary gradient >12 mm Hg suggests intrinsic pulmonary arteriolar hypertension, consistent with a pulmonary cause for PAH and dyspnea.   Neuro/Psych PSYCHIATRIC DISORDERS Anxiety Depression    GI/Hepatic GERD  Medicated,  Endo/Other  diabetes, Type 2  Renal/GU      Musculoskeletal  (+) Arthritis , Osteoarthritis,    Abdominal (+) + obese,   Peds  Hematology negative hematology ROS (+)   Anesthesia Other Findings HLD  Reproductive/Obstetrics                           Anesthesia Physical Anesthesia Plan  ASA: III  Anesthesia Plan: MAC   Post-op Pain Management:    Induction: Intravenous  Airway Management Planned: Natural Airway and Nasal Cannula  Additional Equipment:   Intra-op Plan:   Post-operative Plan:   Informed Consent: I have reviewed the patients History and Physical, chart, labs and discussed the procedure including the risks, benefits and alternatives for the proposed anesthesia with the patient or authorized representative who has indicated his/her understanding and acceptance.     Plan Discussed with:   Anesthesia Plan Comments:        Anesthesia Quick Evaluation

## 2015-11-04 NOTE — Discharge Instructions (Signed)

## 2015-11-04 NOTE — Anesthesia Postprocedure Evaluation (Signed)
Anesthesia Post Note  Patient: Melinda [name]  Procedure(s) Performed: Procedure(s) (LRB): CARDIOVERSION (N/A)  Patient location during evaluation: PACU Anesthesia Type: MAC Level of consciousness: awake and alert Pain management: pain level controlled Vital Signs Assessment: post-procedure vital signs reviewed and stable Respiratory status: spontaneous breathing, nonlabored ventilation and respiratory function stable Cardiovascular status: stable and blood pressure returned to baseline Anesthetic complications: no    Last Vitals:  Vitals:   11/04/15 1340 11/04/15 1350  BP: (!) 97/43 (!) 103/50  Pulse: (!) 53 (!) 53  Resp: (!) 22 (!) 22    Last Pain:  Vitals:   11/04/15 1259  TempSrc: Oral                 Nilda Simmer

## 2015-11-04 NOTE — Procedures (Signed)
Electrical Cardioversion Procedure Note Melinda [name] [id number] 02-Oct-1931  Procedure: Electrical Cardioversion Indications:  Atrial Fibrillation  Procedure Details Consent: Risks of procedure as well as the alternatives and risks of each were explained to the (patient/caregiver).  Consent for procedure obtained. Time Out: Verified patient identification, verified procedure, site/side was marked, verified correct patient position, special equipment/implants available, medications/allergies/relevent history reviewed, required imaging and test results available.  Performed  Patient placed on cardiac monitor, pulse oximetry, supplemental oxygen as necessary.  Sedation given: Pt sedated by anesthesia with lidocaine 80 mg and diprovan 60 mg IV. Pacer pads placed anterior and posterior chest.  Cardioverted 1 time(s).  Cardioverted at 120J.  Evaluation Findings: Post procedure EKG shows: Sinus bradycardia Complications: None Patient did tolerate procedure well.   Kirk Ruths 11/04/2015, 1:34 PM

## 2015-11-04 NOTE — Transfer of Care (Signed)
Immediate Anesthesia Transfer of Care Note  Patient: Melinda [name]  Procedure(s) Performed: Procedure(s): CARDIOVERSION (N/A)  Patient Location: PACU  Anesthesia Type:MAC  Level of Consciousness: awake, alert , oriented, sedated and patient cooperative  Airway & Oxygen Therapy: Patient Spontanous Breathing and Patient connected to nasal cannula oxygen  Post-op Assessment: Report given to RN, Post -op Vital signs reviewed and stable and Patient moving all extremities  Post vital signs: Reviewed and stable  Last Vitals:  Vitals:   11/04/15 1339 11/04/15 1340  BP:  (!) 97/43  Pulse: (!) 52 (!) 53  Resp: (!) 21 (!) 22    Last Pain:  Vitals:   11/04/15 1259  TempSrc: Oral         Complications: No apparent anesthesia complications

## 2015-11-09 ENCOUNTER — Other Ambulatory Visit (HOSPITAL_COMMUNITY): Payer: Self-pay | Admitting: Psychiatry

## 2015-11-13 ENCOUNTER — Encounter: Payer: Self-pay | Admitting: Physician Assistant

## 2015-11-13 ENCOUNTER — Ambulatory Visit (INDEPENDENT_AMBULATORY_CARE_PROVIDER_SITE_OTHER): Payer: Medicare HMO | Admitting: Physician Assistant

## 2015-11-13 ENCOUNTER — Other Ambulatory Visit: Payer: Self-pay

## 2015-11-13 VITALS — BP 128/62 | HR 68 | Ht 60.0 in | Wt 163.0 lb

## 2015-11-13 DIAGNOSIS — R0609 Other forms of dyspnea: Secondary | ICD-10-CM

## 2015-11-13 DIAGNOSIS — Z7901 Long term (current) use of anticoagulants: Secondary | ICD-10-CM

## 2015-11-13 DIAGNOSIS — I4892 Unspecified atrial flutter: Secondary | ICD-10-CM

## 2015-11-13 DIAGNOSIS — R0600 Dyspnea, unspecified: Secondary | ICD-10-CM

## 2015-11-13 NOTE — Progress Notes (Addendum)
Cardiology Office Note   Date:  11/13/2015   ID:  Melinda [name], DOB [birth year], MRN [id number]  PCP:  Jerlyn Ly, MD  Cardiologist:  Dr Martinique Barrett, Rhonda, PA-C   Chief Complaint  Patient presents with  . Appointment    has only had SHOB once going to the mailbox, a little sweeling in ankles, some tiredness, no chest discomfort.     History of Present Illness: Melinda [name] is a 82 y.o. female with a history of PAF, OSA on CPAP, EF nl, PAS 50, +LVH by echo, HTN, non-obs CAD & + PAH by cath 07/2015, GERD, HLD  08/29 s/p DCCV after > 4 wks Eliquis, tol well. Concern for DOE being partly 2nd afib (also w/ OSA, deconditioning and impaired diffusion capacity)  Melinda [name] presents for Post hospital follow-up  Since discharge from the hospital, she has felt better. She has not been closely tracking her heart rate and blood pressure, but she definitely feels generally better and feels like doing more. She has had no palpitations, no awareness of any kind of rapid or irregular heartbeat.  She is working in controlling her diabetes, blood sugars running higher in the mornings lately, in the 140s. She is a little concerned about this. She has also a little concerned that she has lost weight. She is not aware that she has been urinating extra since the cardioversion or is eating less, but her weight is down 13 pounds in the last few months and 4 pounds in the last 2 weeks.   She is not having any bleeding issues or any problems tolerating Eliquis. She is no longer on 2 full strength aspirin daily.  She is not having any lower extremity edema. She denies any new dyspnea on exertion, orthopnea, or PND.  She admits that since her hip replacement, she has not been doing. Much improved and very active. She has trouble climbing stairs because of balance and generalized weakness issues.   Past Medical History:  Diagnosis Date  . Acid reflux   . Anxiety   .  Arthritis   . Cataract    surgery,bilateral  . Constipation   . Depression   . Diabetes mellitus without complication ([place])   . Dyspnea on exertion    a. 07/2015 Echo: EF 55-60%, no rwma, mild MR, mod dil LA/RA/RV, mod TR, triv PR, PASP 60mHg.  . Family history of adverse reaction to anesthesia    mother always had n/v  . Hx: UTI (urinary tract infection)   . Hypercholesterolemia   . Hypertensive heart disease   . Insomnia   . Multifactorial gait disorder   . Non-obstructive CAD    a. 07/2015 Cath: LM 30-40d, LAD min irregs, LCX min irregs, RCA 30p, RA 17, RV 33/7, PA 42/19, PCWP 17.  . Osteoarthritis    a. 06/2015 s/p R total hip arthroplasty.  .Marland KitchenPAF (paroxysmal atrial [place]) ([place]    a. Dx 07/2015, CHA2DS2VASc = 6-->Eliquis.  . Premature atrial contractions   . [place]monary hypertension ([place]    a. 07/2015 Echo: PASP 512mg.  . Marland Kitchenyloric antral stenosis    in childhood,infancy  . Sleep apnea with use of continuous positive airway pressure (CPAP) 10/19/2012   a. compliant w/ CPAP.    Past Surgical History:  Procedure Laterality Date  . ABDOMINAL HYSTERECTOMY  1970  . ABDOMINAL SURGERY     as a [age]ay old baby  . CARDIAC CATHETERIZATION N/A 07/14/2015   Procedure: Right/Left  Heart Cath and Coronary Angiography;  Surgeon: Sanda Klein, MD;  Location: [place] [facility];  Service: Cardiovascular;  Laterality: N/A;  . CARDIOVERSION N/A 11/04/2015   Procedure: CARDIOVERSION;  Surgeon: Lelon Perla, MD;  Location: [facility] ENDOSCOPY;  Service: Cardiovascular;  Laterality: N/A;  . COLONOSCOPY    . EYE SURGERY Bilateral    cataract surgery with lens implant  . INNER EAR SURGERY Left    x 3  . LAPAROSCOPIC SALPINGO OOPHERECTOMY    . NM MYOVIEW LTD  11/30/2007   normal stress nuclear study/EF-83%  . TONSILLECTOMY    . TOTAL HIP ARTHROPLASTY Right 06/26/2015   Procedure: RIGHT TOTAL HIP ARTHROPLASTY ANTERIOR APPROACH;  Surgeon: Leandrew Koyanagi, MD;  Location: [place];  Service: Orthopedics;   Laterality: Right;  . TRANSTHORACIC ECHOCARDIOGRAM  07/23/2009   mild concentric LVH/mild mitral insufficiency/ moderate tricuspid innsufficiency/mild pulmonary HTN/EF- 55-60%  . VAGINAL DELIVERY      Current Outpatient Prescriptions  Medication Sig Dispense Refill  . acetaminophen (TYLENOL) 650 MG CR tablet Take 650 mg by mouth every 8 (eight) hours as needed for pain.    [name] amLODipine (NORVASC) 5 MG tablet Take 5 mg by mouth daily.    [name] apixaban (ELIQUIS) 5 MG TABS tablet Take 1 tablet (5 mg total) by mouth 2 (two) times daily. 60 tablet 0  . aspirin EC 325 MG tablet Take 1 tablet (325 mg total) by mouth 2 (two) times daily. 84 tablet 0  . atorvastatin (LIPITOR) 40 MG tablet Take 40 mg by mouth daily. One tablet in am    . baclofen (LIORESAL) 10 MG tablet TAKE 1 TABLET BY MOUTH THREE TIMES DAILY AS NEEDED 270 tablet 0  . Blood Glucose Monitoring Suppl (ONE TOUCH ULTRA 2) W/DEVICE KIT Reported on 07/11/2015    . Calcium Carbonate-Vitamin D3 (CALCIUM 600+D3) 600-400 MG-UNIT TABS Take 1 tablet by mouth daily.    . cholecalciferol (VITAMIN D) 1000 UNITS tablet Take 1,000 Units by mouth daily. D3    . desipramine (NORPRAMIN) 25 MG tablet Take 25 mg by mouth daily.    [name] glucosamine-chondroitin 500-400 MG tablet Take 1 tablet by mouth every other day.     [name] glucose blood (ACCU-CHEK ACTIVE STRIPS) test strip 1 each by Other route as needed for other. Reported on 07/11/2015    . linaclotide (LINZESS) 145 MCG CAPS capsule Take 145 mcg by mouth daily before breakfast.     . metoprolol (LOPRESSOR) 50 MG tablet Take 25 mg by mouth 2 (two) times daily. Take 1/2 tablet by mouth twice daily    . omeprazole (PRILOSEC) 20 MG capsule Take 20 mg by mouth daily. One tablet daily in mornings    . ONETOUCH DELICA LANCETS 55M MISC Reported on 07/11/2015    . polyethylene glycol (MIRALAX / GLYCOLAX) packet Take 17 g by mouth 2 (two) times daily.    . vitamin B-12 (CYANOCOBALAMIN) 500 MCG tablet Take 1,000 mcg by mouth  daily.      No current facility-administered medications for this visit.     Allergies:   Lasix [furosemide] and Phenobarbital    Social History:  The patient  reports that she has never smoked. She has never used smokeless tobacco. She reports that she does not drink alcohol or use drugs.   Family History:  The patient's family history includes Cancer in her mother; Diabetes in her sister; Heart attack in her father; Heart failure in her mother and sister; Hypertension in her sister.  ROS:  Please see the history of present illness. All other systems are reviewed and negative.    PHYSICAL EXAM: VS:  BP 128/62   Pulse 68   Ht 5' (1.524 m)   Wt 163 lb (73.9 kg)   BMI 31.83 kg/m  , BMI Body mass index is 31.83 kg/m. GEN: Well nourished, well developed, female in no acute distress  HEENT: normal for age  Neck: no JVD, no carotid bruit, no masses Cardiac: RRR; no murmur, no rubs, or gallops Respiratory:  clear to auscultation bilaterally, normal work of breathing GI: soft, nontender, nondistended, + BS MS: no deformity or atrophy; no edema; distal pulses are 2+ in all 4 extremities   Skin: warm and dry, no rash Neuro:  Strength and sensation are intact Psych: euthymic mood, full affect   EKG:  EKG is not ordered today. The ECG post-cardioversion is sinus rhythm   Recent Labs: 07/14/2015: ALT 14; B Natriuretic Peptide 640.5 10/29/2015: BUN 21; Creat 0.91; Hemoglobin 13.5; Platelets 222; Potassium 4.3; Sodium 138    Lipid Panel No results found for: CHOL, TRIG, HDL, CHOLHDL, VLDL, LDLCALC, LDLDIRECT   Wt Readings from Last 3 Encounters:  11/13/15 163 lb (73.9 kg)  10/29/15 167 lb 3.2 oz (75.8 kg)  07/31/15 176 lb (79.8 kg)     Other studies Reviewed: Additional studies/ records that were reviewed today include: Office notes and hospital records.  ASSESSMENT AND PLAN:  1.  Persistent atrial flutter: She was cardioverted into sinus rhythm and is maintaining that  based on symptoms and auscultation. Vital signs are stable and symptomatically she is improved. Continue current beta blocker and anticoagulation.  2. Chronic anticoagulation: Continue Eliquis, she is no longer on aspirin, CHA2DS2VASc=5 (HTN, CAD, female,age x 2)  3. Dyspnea on exertion: Her symptoms have improved post cardioversion. She is encouraged to increase her activity gradually and see how tolerated. Of note, she could only go up 4 or 5 stairs without stopping for breath prior to the cardioversion. She can now do an entire floor as long as she goes slowly.  Current medicines are reviewed at length with the patient today.  The patient does not have concerns regarding medicines.  The following changes have been made:  no change  Labs/ tests ordered today include:  No orders of the defined types were placed in this encounter.    Disposition:   FU with Dr. Martinique  Signed, Rosaria Ferries, PA-C  11/13/2015 9:51 AM    [place] Phone: [phone]; Fax: [phone]  This note was written with the assistance of speech recognition software. Please excuse any transcriptional errors.

## 2015-11-13 NOTE — Patient Instructions (Addendum)
Continue same medications    Your physician wants you to follow-up in: 6 months with Dr.Jordan. You will receive a reminder letter in the mail two months in advance. If you don't receive a letter, please call our office to schedule the follow-up appointment.

## 2015-11-18 ENCOUNTER — Other Ambulatory Visit: Payer: Self-pay | Admitting: Orthopaedic Surgery

## 2015-11-18 DIAGNOSIS — M25512 Pain in left shoulder: Secondary | ICD-10-CM

## 2015-11-19 ENCOUNTER — Other Ambulatory Visit: Payer: Self-pay | Admitting: Orthopaedic Surgery

## 2015-11-19 DIAGNOSIS — M25511 Pain in right shoulder: Secondary | ICD-10-CM

## 2015-11-27 ENCOUNTER — Ambulatory Visit
Admission: RE | Admit: 2015-11-27 | Discharge: 2015-11-27 | Disposition: A | Discharge status: Home / Self Care | Payer: Medicare HMO | Source: Ambulatory Visit | Attending: Orthopaedic Surgery | Admitting: Orthopaedic Surgery

## 2015-11-27 DIAGNOSIS — M25511 Pain in right shoulder: Secondary | ICD-10-CM

## 2015-12-17 ENCOUNTER — Encounter: Payer: Self-pay | Admitting: Emergency Medicine

## 2015-12-17 ENCOUNTER — Emergency Department (INDEPENDENT_AMBULATORY_CARE_PROVIDER_SITE_OTHER): Payer: Medicare HMO

## 2015-12-17 ENCOUNTER — Emergency Department
Admission: EM | Admit: 2015-12-17 | Discharge: 2015-12-17 | Disposition: A | Discharge status: Home / Self Care | Payer: Medicare HMO | Source: Home / Self Care | Attending: Family Medicine | Admitting: Family Medicine

## 2015-12-17 DIAGNOSIS — R058 Other specified cough: Secondary | ICD-10-CM

## 2015-12-17 DIAGNOSIS — I700 Atherosclerosis of aorta: Secondary | ICD-10-CM

## 2015-12-17 DIAGNOSIS — R05 Cough: Secondary | ICD-10-CM

## 2015-12-17 DIAGNOSIS — G4733 Obstructive sleep apnea (adult) (pediatric): Secondary | ICD-10-CM

## 2015-12-17 DIAGNOSIS — R918 Other nonspecific abnormal finding of lung field: Secondary | ICD-10-CM | POA: Diagnosis not present

## 2015-12-17 MED ORDER — BENZONATATE 100 MG PO CAPS: 100.0000 mg | ORAL_CAPSULE | Freq: Three times a day (TID) | ORAL | 0 refills | Status: DC

## 2015-12-17 MED ORDER — PREDNISONE 20 MG PO TABS: ORAL_TABLET | ORAL | 0 refills | Status: DC

## 2015-12-17 NOTE — ED Provider Notes (Signed)
CSN: [id number]     Arrival date & time 12/17/15  1116 History   First MD Initiated Contact with Patient 12/17/15 1141     Chief Complaint  Patient presents with  . Cough   (Consider location/radiation/quality/duration/timing/severity/associated sxs/prior Treatment) HPI  Melinda [name] is a 82 y.o. female presenting to UC with c/o moderately productive cough with clear sputum that has been worsening over the last 1 week.  She reports mild nasal congestion.  Symptoms started with a sore throat that lasted 1 night. She gargled saltwater, throat pain was gone by the next morning. Denies fever, chills, n/v/d. Denies chest pain or SOB at this time, mild chest soreness with cough.  She reports being admitted for A-fib about 1 month ago.  Her heart was converted back into a regular rhythm, she has been doing well since.  She has f/u with her cardiologist on 11/13/15 and was doing well. She is currently on Plavix.  Denies leg swelling or pain. Denies hx of blood clots. Hx of DM but notes it is well controlled as her HgbA1c is still below 7.    Past Medical History:  Diagnosis Date  . Acid reflux   . Anxiety   . Arthritis   . Cataract    surgery,bilateral  . Constipation   . Depression   . Diabetes mellitus without complication ([place])   . Dyspnea on exertion    a. 07/2015 Echo: EF 55-60%, no rwma, mild MR, mod dil LA/RA/RV, mod TR, triv PR, PASP 20mHg.  . Family history of adverse reaction to anesthesia    mother always had n/v  . Hx: UTI (urinary tract infection)   . Hypercholesterolemia   . Hypertensive heart disease   . Insomnia   . Multifactorial gait disorder   . Non-obstructive CAD    a. 07/2015 Cath: LM 30-40d, LAD min irregs, LCX min irregs, RCA 30p, RA 17, RV 33/7, PA 42/19, PCWP 17.  . Osteoarthritis    a. 06/2015 s/p R total hip arthroplasty.  .Marland KitchenPAF (paroxysmal atrial fibrillation) ([place]    a. Dx 07/2015, CHA2DS2VASc = 6-->Eliquis.  . Premature atrial contractions   .  Pulmonary hypertension    a. 07/2015 Echo: PASP 562mg.  . Marland Kitchenyloric antral stenosis    in childhood,infancy  . Sleep apnea with use of continuous positive airway pressure (CPAP) 10/19/2012   a. compliant w/ CPAP.   Past Surgical History:  Procedure Laterality Date  . ABDOMINAL HYSTERECTOMY  1970  . ABDOMINAL SURGERY     as a [age]ay old baby  . CARDIAC CATHETERIZATION N/A 07/14/2015   Procedure: Right/Left Heart Cath and Coronary Angiography;  Surgeon: MiSanda KleinMD;  [facility];  Service: Cardiovascular;  Laterality: N/A;  . CARDIOVERSION N/A 11/04/2015   Procedure: CARDIOVERSION;  Surgeon: BrLelon PerlaMD;  [facility];  Service: Cardiovascular;  Laterality: N/A;  . COLONOSCOPY    . EYE SURGERY Bilateral    cataract surgery with lens implant  . INNER EAR SURGERY Left    x 3  . LAPAROSCOPIC SALPINGO OOPHERECTOMY    . NM MYOVIEW LTD  11/30/2007   normal stress nuclear study/EF-83%  . TONSILLECTOMY    . TOTAL HIP ARTHROPLASTY Right 06/26/2015   Procedure: RIGHT TOTAL HIP ARTHROPLASTY ANTERIOR APPROACH;  Surgeon: NaLeandrew KoyanagiMD;  [facility]: [facility];  Laterality: Right;  . TRANSTHORACIC ECHOCARDIOGRAM  07/23/2009   mild concentric LVH/mild mitral insufficiency/ moderate tricuspid innsufficiency/mild pulmonary HTN/EF-  55-60%  . VAGINAL DELIVERY     Family History  Problem Relation Age of Onset  . Heart attack Father   . Heart failure Mother   . Cancer Mother     colon,kidney  . Diabetes Sister   . Hypertension Sister   . Heart failure Sister    Social History  Substance Use Topics  . Smoking status: Never Smoker  . Smokeless tobacco: Never Used  . Alcohol use No     Comment: rare   OB History    No data available     Review of Systems  Constitutional: Negative for chills and fever.  HENT: Positive for congestion and sore throat. Negative for ear pain, trouble swallowing and voice change.   Respiratory: Positive for  cough. Negative for shortness of breath.   Cardiovascular: Negative for chest pain, palpitations and leg swelling.  Gastrointestinal: Negative for abdominal pain, diarrhea, nausea and vomiting.  Musculoskeletal: Negative for arthralgias, back pain and myalgias.  Skin: Negative for rash.    Allergies  Lasix [furosemide] and Phenobarbital  Home Medications   Prior to Admission medications   Medication Sig Start Date End Date Taking? Authorizing Provider  acetaminophen (TYLENOL) 650 MG CR tablet Take 650 mg by mouth every 8 (eight) hours as needed for pain.    Historical Provider, MD  amLODipine (NORVASC) 5 MG tablet Take 5 mg by mouth daily.    Historical Provider, MD  apixaban (ELIQUIS) 5 MG TABS tablet Take 1 tablet (5 mg total) by mouth 2 (two) times daily. 07/15/15   Robbie Lis, MD  atorvastatin (LIPITOR) 40 MG tablet Take 40 mg by mouth daily. One tablet in am    Historical Provider, MD  baclofen (LIORESAL) 10 MG tablet TAKE 1 TABLET BY MOUTH THREE TIMES DAILY AS NEEDED 07/14/15   Monina C Medina-Vargas, NP  benzonatate (TESSALON) 100 MG capsule Take 1-2 capsules (100-200 mg total) by mouth every 8 (eight) hours. 12/17/15   Noland Fordyce, PA-C  Blood Glucose Monitoring Suppl (ONE TOUCH ULTRA 2) W/DEVICE KIT Reported on 07/11/2015 06/21/13   Historical Provider, MD  Calcium Carbonate-Vitamin D3 (CALCIUM 600+D3) 600-400 MG-UNIT TABS Take 1 tablet by mouth daily.    Historical Provider, MD  cholecalciferol (VITAMIN D) 1000 UNITS tablet Take 1,000 Units by mouth daily. D3    Historical Provider, MD  desipramine (NORPRAMIN) 25 MG tablet Take 25 mg by mouth daily.    Historical Provider, MD  glucosamine-chondroitin 500-400 MG tablet Take 1 tablet by mouth every other day.     Historical Provider, MD  glucose blood (ACCU-CHEK ACTIVE STRIPS) test strip 1 each by Other route as needed for other. Reported on 07/11/2015    Historical Provider, MD  linaclotide (LINZESS) 145 MCG CAPS capsule Take 145 mcg  by mouth daily before breakfast.     Historical Provider, MD  metoprolol (LOPRESSOR) 50 MG tablet Take 25 mg by mouth 2 (two) times daily. Take 1/2 tablet by mouth twice daily    Historical Provider, MD  omeprazole (PRILOSEC) 20 MG capsule Take 20 mg by mouth daily. One tablet daily in mornings    Historical Provider, MD  [facility] DELICA LANCETS 67H MISC Reported on 07/11/2015 06/21/13   Historical Provider, MD  polyethylene glycol (MIRALAX / GLYCOLAX) packet Take 17 g by mouth 2 (two) times daily.    Historical Provider, MD  predniSONE (DELTASONE) 20 MG tablet 2 tabs po daily x 3 days, 1 tab for 2 days 12/17/15   Noland Fordyce,  PA-C  vitamin B-12 (CYANOCOBALAMIN) 500 MCG tablet Take 1,000 mcg by mouth daily.     Historical Provider, MD   Meds Ordered and Administered this Visit  Medications - No data to display  BP 102/62 (BP Location: Left Arm)   Pulse 63   Temp 98.3 F (36.8 C) (Oral)   Ht 5' (1.524 m)   Wt 167 lb (75.8 kg)   SpO2 90%   BMI 32.61 kg/m  No data found.   Physical Exam  Constitutional: She appears well-developed and well-nourished. No distress.  Sitting on exam bed, appears well, non-toxic. NAD  HENT:  Head: Normocephalic and atraumatic.  Right Ear: Tympanic membrane normal.  Left Ear: Tympanic membrane normal.  Nose: Nose normal.  Mouth/Throat: Uvula is midline, oropharynx is clear and moist and mucous membranes are normal.  Eyes: Conjunctivae are normal. No scleral icterus.  Neck: Normal range of motion. Neck supple.  Cardiovascular: Normal rate, regular rhythm and normal heart sounds.   Pulmonary/Chest: Effort normal and breath sounds normal. No stridor. No respiratory distress. She has no wheezes. She has no rales. She exhibits no tenderness.  Occasional productive cough during exam. No respiratory distress. Able to speak in full sentences. Lungs: CTAB. No wheeze or rhonchi.  Abdominal: Soft. She exhibits no distension. There is no tenderness.  Musculoskeletal:  Normal range of motion.  Lymphadenopathy:    She has no cervical adenopathy.  Neurological: She is alert.  Skin: Skin is warm and dry. She is not diaphoretic.  Nursing note and vitals reviewed.   Urgent Care Course   Clinical Course  Comment By Time  O2 Sat 94% on RA Noland Fordyce, PA-C 10/11 1149    Procedures (including critical care time)  Labs Review Labs Reviewed - No data to display  Imaging Review Dg Chest 2 View  Result Date: 12/17/2015 CLINICAL DATA:  Worsening cough for the past week ; history of cardiac dysrhythmia. , obstructive sleep apnea, nonsmoker. EXAM: CHEST  2 VIEW COMPARISON:  PA and lateral chest x-ray of December 14, 2015 FINDINGS: The lungs are reasonably well inflated. The right hemidiaphragm is chronically elevated as compared to the left. The heart is mildly enlarged. The pulmonary vascularity is prominent centrally mild cephalization. The interstitial markings are slightly more conspicuous overall today. There is calcification in the wall of the aortic arch. There is no pleural effusion. There is multilevel degenerative disc disease of the thoracic spine. IMPRESSION: Mildly increased prominence of the pulmonary vascularity and pulmonary interstitium is worrisome for low-grade CHF. There is no alveolar pneumonia nor pleural effusion. Aortic atherosclerosis. Electronically Signed   By: David  Martinique M.D.   On: 12/17/2015 12:05     MDM   1. Productive cough    Pt c/o productive cough for about 1 week, gradually worsening. Initially started as a sore throat that only lasted one night. Pt is afebrile, no respiratory distress. O2 Sat 90% on RA initially but increased to 94% on RA after pt coughed some.  Lungs: CTAB, no wheeze or rhonchi.  CXR: worrisome for low-grade CHF w/o alveolar pneumonia or pleural effusion.  Discussed pt with Dr. Georgina Snell. Recommend close f/u with PCP and/or cardiology. Will try trial of prednisone and tessalon for cough in  meantime. Discussed potential of elevated blood glucose on prednisone. Pt understands to monitor. Discussed symptoms that warrant emergent care in the ED. Patient verbalized understanding and agreement with treatment plan.     Noland Fordyce, PA-C 12/17/15 [place],  PA-C 12/17/15 1331

## 2015-12-17 NOTE — ED Triage Notes (Signed)
Productive cough x 1 week, started with a sore throat which resolved after one day, cough persisted. Clear mucus.

## 2015-12-18 ENCOUNTER — Telehealth: Payer: Self-pay | Admitting: Emergency Medicine

## 2015-12-22 ENCOUNTER — Ambulatory Visit (INDEPENDENT_AMBULATORY_CARE_PROVIDER_SITE_OTHER): Payer: Medicare HMO | Admitting: Orthopaedic Surgery

## 2015-12-22 DIAGNOSIS — M25511 Pain in right shoulder: Secondary | ICD-10-CM | POA: Diagnosis not present

## 2015-12-26 ENCOUNTER — Ambulatory Visit: Payer: Medicare HMO | Admitting: Cardiology

## 2016-01-19 ENCOUNTER — Other Ambulatory Visit: Payer: Self-pay | Admitting: Internal Medicine

## 2016-01-19 DIAGNOSIS — Z1231 Encounter for screening mammogram for malignant neoplasm of breast: Secondary | ICD-10-CM

## 2016-01-27 ENCOUNTER — Telehealth: Payer: Self-pay | Admitting: Cardiology

## 2016-01-27 ENCOUNTER — Encounter: Payer: Self-pay | Admitting: Internal Medicine

## 2016-01-27 ENCOUNTER — Ambulatory Visit (INDEPENDENT_AMBULATORY_CARE_PROVIDER_SITE_OTHER): Payer: Medicare HMO | Admitting: Physician Assistant

## 2016-01-27 ENCOUNTER — Encounter: Payer: Self-pay | Admitting: Physician Assistant

## 2016-01-27 VITALS — BP 141/93 | HR 115 | Ht 60.0 in | Wt 168.6 lb

## 2016-01-27 DIAGNOSIS — I10 Essential (primary) hypertension: Secondary | ICD-10-CM

## 2016-01-27 DIAGNOSIS — I4892 Unspecified atrial flutter: Secondary | ICD-10-CM

## 2016-01-27 DIAGNOSIS — Z7901 Long term (current) use of anticoagulants: Secondary | ICD-10-CM | POA: Diagnosis not present

## 2016-01-27 DIAGNOSIS — I480 Paroxysmal atrial fibrillation: Secondary | ICD-10-CM

## 2016-01-27 MED ORDER — METOPROLOL TARTRATE 50 MG PO TABS: 50.0000 mg | ORAL_TABLET | Freq: Two times a day (BID) | ORAL | 1 refills | Status: DC

## 2016-01-27 NOTE — Progress Notes (Signed)
Cardiology Office Note   Date:  01/27/2016   ID:  Melinda [name], DOB 1931/12/31, MRN [id number]  PCP:  Jerlyn Ly, MD  Cardiologist:  Dr Martinique   Kashten Gowin, PA-C 11/13/2015  Chief Complaint  Patient presents with  . Follow-up    pt states no chest pain     History of Present Illness: Melinda [name] is a 82 y.o. female with a history of PAF on Eliquis, non-obs CAD w/ PAH 07/2015 cath, EF nl by echo w/ +LVH but unable to est diast dysf, PAS 50. Hx DM, HTN, HLD, GERD, anxiety, OSA on CPAP with O2, CHA2DS2VASc=5 (HTN, CAD, female,age x 2) .  08/29 DCCV>SR  09/07 ofc visit, in [place], [place] improving 11/21, phone notes w/ pt reporting palpitaitons>>ofc visit scheduled  Melinda [name] presents for evaluation of palpitations.  She was doing well until 11/09. She tracks her BP and her CBG almost every day. Her SBP was as low as 102, but most of the time was >130. Her heart rate was high 50s, low 60s till 11/09, when she noticed her heart rate go up to approximately 100. It has been high ever since.  She has been tired. No chest pain. Not SOB at rest, but has significantly increased DOE. She has slowed down, is managing fairly well. She decided to call the office when her HR started climbing to 110 and above.   No orthopnea or PND, has daytime LE edema, L>R.   No bleeding issues. Missed one dose of Eliquis over the weekend, did not realize she had not taken her morning dose till 5 pm, just took the pm dose. 11/18 or 11/19.    Past Medical History:  Diagnosis Date  . Acid reflux   . Anxiety   . Arthritis   . Cataract    surgery,bilateral  . Constipation   . Depression   . Diabetes mellitus without complication ([place])   . Dyspnea on exertion    a. 07/2015 Echo: EF 55-60%, no rwma, mild MR, mod dil LA/RA/RV, mod TR, triv PR, PASP 59mHg.  . Family history of adverse reaction to anesthesia    mother always had n/v  . Hx: UTI (urinary tract infection)   .  Hypercholesterolemia   . Hypertensive heart disease   . Insomnia   . Multifactorial gait disorder   . Non-obstructive CAD    a. 07/2015 Cath: LM 30-40d, LAD min irregs, LCX min irregs, RCA 30p, RA 17, RV 33/7, PA 42/19, PCWP 17.  . Osteoarthritis    a. 06/2015 s/p R total hip arthroplasty.  .Melinda KitchenPAF (paroxysmal atrial fibrillation) ([place]    a. Dx 07/2015, CHA2DS2VASc = 6-->Eliquis.  . Premature atrial contractions   . Pulmonary hypertension    a. 07/2015 Echo: PASP 512mg.  . Melinda Kitchenyloric antral stenosis    in childhood,infancy  . Sleep apnea with use of continuous positive airway pressure (CPAP) 10/19/2012   a. compliant w/ CPAP.    Past Surgical History:  Procedure Laterality Date  . ABDOMINAL HYSTERECTOMY  1970  . ABDOMINAL SURGERY     as a [age]ay old baby  . CARDIAC CATHETERIZATION N/A 07/14/2015   Procedure: Right/Left Heart Cath and Coronary Angiography;  Surgeon: MiSanda KleinMD;  [facility];  Service: Cardiovascular;  Laterality: N/A;  . CARDIOVERSION N/A 11/04/2015   Procedure: CARDIOVERSION;  Surgeon: BrLelon PerlaMD;  [facility];  Service: Cardiovascular;  Laterality: N/A;  . COLONOSCOPY    .  EYE SURGERY Bilateral    cataract surgery with lens implant  . INNER EAR SURGERY Left    x 3  . LAPAROSCOPIC SALPINGO OOPHERECTOMY    . NM MYOVIEW LTD  11/30/2007   normal stress nuclear study/EF-83%  . TONSILLECTOMY    . TOTAL HIP ARTHROPLASTY Right 06/26/2015   Procedure: RIGHT TOTAL HIP ARTHROPLASTY ANTERIOR APPROACH;  Surgeon: Leandrew Koyanagi, MD;  Location: [facility];  Service: Orthopedics;  Laterality: Right;  . TRANSTHORACIC ECHOCARDIOGRAM  07/23/2009   mild concentric LVH/mild mitral insufficiency/ moderate tricuspid innsufficiency/mild pulmonary HTN/EF- 55-60%  . VAGINAL DELIVERY      Current Outpatient Prescriptions  Medication Sig Dispense Refill  . acetaminophen (TYLENOL) 650 MG CR tablet Take 650 mg by mouth every 8 (eight) hours as needed for  pain.    Melinda [name] amLODipine (NORVASC) 5 MG tablet Take 5 mg by mouth daily.    Melinda [name] apixaban (ELIQUIS) 5 MG TABS tablet Take 1 tablet (5 mg total) by mouth 2 (two) times daily. 60 tablet 0  . atorvastatin (LIPITOR) 40 MG tablet Take 40 mg by mouth daily. One tablet in am    . benzonatate (TESSALON) 100 MG capsule Take 1-2 capsules (100-200 mg total) by mouth every 8 (eight) hours. 21 capsule 0  . Blood Glucose Monitoring Suppl (ONE TOUCH ULTRA 2) W/DEVICE KIT Reported on 07/11/2015    . Calcium Carbonate-Vitamin D3 (CALCIUM 600+D3) 600-400 MG-UNIT TABS Take 1 tablet by mouth daily.    . cholecalciferol (VITAMIN D) 1000 UNITS tablet Take 1,000 Units by mouth daily. D3    . desipramine (NORPRAMIN) 25 MG tablet Take 25 mg by mouth daily.    Melinda [name] glucosamine-chondroitin 500-400 MG tablet Take 1 tablet by mouth every other day.     Melinda [name] glucose blood (ACCU-CHEK ACTIVE STRIPS) test strip 1 each by Other route as needed for other. Reported on 07/11/2015    . linaclotide (LINZESS) 145 MCG CAPS capsule Take 145 mcg by mouth daily before breakfast.     . LORazepam (ATIVAN) 0.5 MG tablet Take 1 tablet by mouth at bedtime.  5  . metoprolol (LOPRESSOR) 50 MG tablet Take 25 mg by mouth 2 (two) times daily. Take 1/2 tablet by mouth twice daily    . omeprazole (PRILOSEC) 20 MG capsule Take 20 mg by mouth daily. One tablet daily in mornings    . ONETOUCH DELICA LANCETS 98P MISC Reported on 07/11/2015    . polyethylene glycol (MIRALAX / GLYCOLAX) packet Take 17 g by mouth 2 (two) times daily.    . predniSONE (DELTASONE) 20 MG tablet 2 tabs po daily x 3 days, 1 tab for 2 days 8 tablet 0  . vitamin B-12 (CYANOCOBALAMIN) 500 MCG tablet Take 1,000 mcg by mouth daily.      No current facility-administered medications for this visit.     Allergies:   Lasix [furosemide] and Phenobarbital    Social History:  The patient  reports that she has never smoked. She has never used smokeless tobacco. She reports that she does not drink alcohol  or use drugs.   Family History:  The patient's family history includes Cancer in her mother; Diabetes in her sister; Heart attack in her father; Heart failure in her mother and sister; Hypertension in her sister.    ROS:  Please see the history of present illness. All other systems are reviewed and negative.    PHYSICAL EXAM: VS:  BP (!) 141/93   Pulse (!) 115   Ht 5' (  1.524 m)   Wt 168 lb 9.6 oz (76.5 kg)   SpO2 95%   BMI 32.93 kg/m  , BMI Body mass index is 32.93 kg/m. GEN: Well nourished, well developed, female in no acute distress  HEENT: normal for age  Neck: minimal JVD, no carotid bruit, no masses Cardiac: Irreg R&R; no murmur, no rubs, or gallops Respiratory:  clear to auscultation bilaterally, normal work of breathing GI: soft, nontender, nondistended, + BS MS: no deformity or atrophy; 1+ edema; distal pulses are 2+ in all 4 extremities   Skin: warm and dry, no rash Neuro:  Strength and sensation are intact Psych: euthymic mood, full affect   EKG:  EKG is ordered today. The ekg ordered today demonstrates Atrial fib, RVR w/ HR 113.   ECHO: 07/2015 Left ventricle: The cavity size was normal. There was mild   concentric hypertrophy. Systolic function was normal. The   estimated ejection fraction was in the range of 55% to 60%. Wall   motion was normal; there were no regional wall motion abnormalities. - Aortic valve: Transvalvular velocity was within the normal range.   There was no stenosis. There was no regurgitation. - Mitral valve: Calcified annulus. Mildly thickened leaflets .   There was mild regurgitation. - Left atrium: The atrium was moderately dilated. - Right ventricle: The cavity size was moderately dilated. Wall   thickness was normal. - Right atrium: The atrium was moderately dilated. - Tricuspid valve: There was moderate regurgitation. - Pulmonic valve: There was trivial regurgitation. - Pulmonary arteries: Systolic pressure was moderately  increased.   PA peak pressure: 50 mm Hg (S). - Inferior vena cava: The vessel was dilated. The respirophasic   diameter changes were blunted (< 50%), consistent with elevated   central venous pressure. - Pericardium, extracardiac: There was no pericardial effusion.   Recent Labs: 07/14/2015: ALT 14; B Natriuretic Peptide 640.5 10/29/2015: BUN 21; Creat 0.91; Hemoglobin 13.5; Platelets 222; Potassium 4.3; Sodium 138    Lipid Panel No results found for: CHOL, TRIG, HDL, CHOLHDL, VLDL, LDLCALC, LDLDIRECT   Wt Readings from Last 3 Encounters:  01/27/16 168 lb 9.6 oz (76.5 kg)  12/17/15 167 lb (75.8 kg)  11/13/15 163 lb (73.9 kg)     Other studies Reviewed: Additional studies/ records that were reviewed today include: Office notes and other testing.  ASSESSMENT AND PLAN:  1.  PAFlutter: There was no precipitating event. No recent illness or problem. Her heart rate is elevated so will increase her BB. However, when in SR, her heart rate is in the high 50s, so she will have to cut back to the previous dose if she goes back into SR.  Plan DCCV in 4 weeks since she missed a dose of Eliquis recently, unless Dr Martinique feels it can be done sooner.   Discuss with MD if pt would be a candidate for Flecainide "pill in pocket" approach for future occurrences.  2. HTN: Management per Dr Joylene Draft, but believe her BP will tolerate an increase in the BB while in atrial flutter  3. Chronic anticoagulation: she did miss a dose of Eliquis over the weekend, she is not sure which day. She is generally compliant and this was emphasized.    Current medicines are reviewed at length with the patient today.  The patient does not have concerns regarding medicines.  The following changes have been made:  Increase metoprolol while in atrial flutter  Labs/ tests ordered today include:   Orders Placed This Encounter  Procedures  . EKG 12-Lead     Disposition:   FU with Dr Martinique  Signed, Rosaria Ferries, PA-C  01/27/2016 3:52 PM    [facility] Phone: [phone]; Fax: [phone]  This note was written with the assistance of speech recognition software. Please excuse any transcriptional errors.

## 2016-01-27 NOTE — Telephone Encounter (Signed)
New Message  Patient c/o Palpitations:  High priority if patient c/o lightheadedness and shortness of breath.  1. How long have you been having palpitations? Per pt states she experienced it on 8/29 and again on 11/9  2. Are you currently experiencing lightheadedness and shortness of breath? Per pt not more than normal  3. Have you checked your BP and heart rate? (document readings) today 11/21 120 beats a minute per pt   4. Are you experiencing any other symptoms? Per pt states she just been very tired

## 2016-01-27 NOTE — Telephone Encounter (Signed)
Spoke with pt states that she is currently having palpitations, pt has AFIB. She states that she definitely has had palpitations since 730am today but she did wake multiple times (at least 3 times) thrughout the night and was experiencing that at that time also, she states that she was able to go back to sleep but she was having palpitations.  She states that her BP today is 130/80 Hr 120; after that it was 116/76 HR 122.  BP yesterday (no Palpitations)109/81 HR 110 then 133/73 HR 96. Scheduled appt with Suanne Marker today at 1130am for evulation/discussion

## 2016-01-27 NOTE — Patient Instructions (Addendum)
Medication Instructions: Increase Metoprolol to 50 mg (1 tab) twice daily. If you convert, go back to old dose--1/2 tab twice daily.   Labwork: To be drawn on the day of procedure.  Testing/Procedures: Your physician has recommended that you have a Cardioversion (DCCV) in 4 weeks with Dr. Martinique.. Electrical Cardioversion uses a jolt of electricity to your heart either through paddles or wired patches attached to your chest. This is a controlled, usually prescheduled, procedure. Defibrillation is done under light anesthesia in the hospital, and you usually go home the day of the procedure. This is done to get your heart back into a normal rhythm. You are not awake for the procedure. Please see the instruction sheet given to you today.  Electrical Cardioversion Electrical cardioversion is the delivery of a jolt of electricity to restore a normal rhythm to the heart. A rhythm that is too fast or is not regular keeps the heart from pumping well. In this procedure, sticky patches or metal paddles are placed on the chest to deliver electricity to the heart from a device. This procedure may be done in an emergency if:  There is low or no blood pressure as a result of the heart rhythm.  Normal rhythm must be restored as fast as possible to protect the brain and heart from further damage.  It may save a life. This procedure may also be done for irregular or fast heart rhythms that are not immediately life-threatening. Tell a health care provider about:  Any allergies you have.  All medicines you are taking, including vitamins, herbs, eye drops, creams, and over-the-counter medicines.  Any problems you or family members have had with anesthetic medicines.  Any blood disorders you have.  Any surgeries you have had.  Any medical conditions you have.  Whether you are pregnant or may be pregnant. What are the risks? Generally, this is a safe procedure. However, problems may occur,  including:  Allergic reactions to medicines.  A blood clot that breaks free and travels to other parts of your body.  The possible return of an abnormal heart rhythm within hours or days after the procedure.  Your heart stopping (cardiac arrest). This is rare. What happens before the procedure? Medicines  Your health care provider may have you start taking:  Blood-thinning medicines (anticoagulants) so your blood does not clot as easily.  Medicines may be given to help stabilize your heart rate and rhythm.  Ask your health care provider about changing or stopping your regular medicines. This is especially important if you are taking diabetes medicines or blood thinners. General instructions  Plan to have someone take you home from the hospital or clinic.  If you will be going home right after the procedure, plan to have someone with you for 24 hours.  Follow instructions from your health care provider about eating or drinking restrictions. What happens during the procedure?  To lower your risk of infection:  Your health care team will wash or sanitize their hands.  Your skin will be washed with soap.  An IV tube will be inserted into one of your veins.  You will be given a medicine to help you relax (sedative).  Sticky patches (electrodes) or metal paddles may be placed on your chest.  An electrical shock will be delivered. The procedure may vary among health care providers and hospitals. What happens after the procedure?  Your blood pressure, heart rate, breathing rate, and blood oxygen level will be monitored until the medicines you  were given have worn off.  Do not drive for 24 hours if you were given a sedative.  Your heart rhythm will be watched to make sure it does not change. This information is not intended to replace advice given to you by your health care provider. Make sure you discuss any questions you have with your health care provider. Document  Released: 02/12/2002 Document Revised: 10/22/2015 Document Reviewed: 08/29/2015 Elsevier Interactive Patient Education  2017 [employer]      Other Instructions:  Melinda [name] recommends wearing compression stockings everyday.   How to Use Compression Stockings Compression stockings are elastic socks that squeeze the legs. They help to increase blood flow to the legs, decrease swelling in the legs, and reduce the chance of developing blood clots in the lower legs. Compression stockings are often used by people who:  Are recovering from surgery.  Have poor circulation in their legs.  Are prone to getting blood clots in their legs.  Have varicose veins.  Sit or stay in bed for long periods of time. How to use compression stockings Before you put on your compression stockings:  Make sure that they are the correct size. If you do not know your size, ask your health care provider.  Make sure that they are clean, dry, and in good condition.  Check them for rips and tears. Do not put them on if they are ripped or torn. Put your stockings on first thing in the morning, before you get out of bed. Keep them on for as long as your health care provider advises. When you are wearing your stockings:  Keep them as smooth as possible. Do not allow them to bunch up. It is especially important to prevent the stockings from bunching up around your toes or behind your knees.  Do not roll the stockings downward and leave them rolled down. This can decrease blood flow to your leg.  Change them right away if they become wet or dirty. When you take off your stockings, inspect your legs and feet. Anything that does not seem normal may require medical attention. Look for:  Open sores.  Red spots.  Swelling. Information and tips  Do not stop wearing your compression stockings without talking to your health care provider first.  Wash your stockings every day with mild detergent in cold or warm  water. Do not use bleach. Air-dry your stockings or dry them in a clothes dryer on low heat.  Replace your stockings every 3-6 months.  If skin moisturizing is part of your treatment plan, apply lotion or cream at night so that your skin will be dry when you put on the stockings in the morning. It is harder to put the stockings on when you have lotion on your legs or feet. Contact a health care provider if: Remove your stockings and seek medical care if:  You have a feeling of pins and needles in your feet or legs.  You have any new changes in your skin.  You have skin lesions that are getting worse.  You have swelling or pain that is getting worse. Get help right away if:  You have numbness or tingling in your lower legs that does not get better right after you take the stockings off.  Your toes or feet become cold and blue.  You develop open sores or red spots on your legs that do not go away.  You see or feel a warm spot on your leg.  You have new  swelling or soreness in your leg.  You are short of breath or you have chest pain for no reason.  You have a rapid or irregular heartbeat.  You feel light-headed or dizzy. This information is not intended to replace advice given to you by your health care provider. Make sure you discuss any questions you have with your health care provider. Document Released: 12/20/2008 Document Revised: 07/23/2015 Document Reviewed: 01/30/2014 Elsevier Interactive Patient Education  2017 [employer].     If you need a refill on your cardiac medications before your next appointment, please call your pharmacy.

## 2016-02-06 ENCOUNTER — Other Ambulatory Visit (HOSPITAL_COMMUNITY): Payer: Self-pay | Admitting: Psychiatry

## 2016-02-19 LAB — CBC
HEMATOCRIT: 41 % (ref 35.0–45.0)
HEMOGLOBIN: 13.4 g/dL (ref 11.7–15.5)
MCH: 31.2 pg (ref 27.0–33.0)
MCHC: 32.7 g/dL (ref 32.0–36.0)
MCV: 95.6 fL (ref 80.0–100.0)
MPV: 10 fL (ref 7.5–12.5)
Platelets: 206 10*3/uL (ref 140–400)
RBC: 4.29 MIL/uL (ref 3.80–5.10)
RDW: 14.2 % (ref 11.0–15.0)
WBC: 5.2 10*3/uL (ref 3.8–10.8)

## 2016-02-19 LAB — BASIC METABOLIC PANEL
BUN: 16 mg/dL (ref 7–25)
CHLORIDE: 103 mmol/L (ref 98–110)
CO2: 28 mmol/L (ref 20–31)
Calcium: 9.6 mg/dL (ref 8.6–10.4)
Creat: 0.82 mg/dL (ref 0.60–0.88)
Glucose, Bld: 85 mg/dL (ref 65–99)
POTASSIUM: 4.1 mmol/L (ref 3.5–5.3)
SODIUM: 139 mmol/L (ref 135–146)

## 2016-02-23 ENCOUNTER — Ambulatory Visit
Admission: RE | Admit: 2016-02-23 | Discharge: 2016-02-23 | Disposition: A | Discharge status: Home / Self Care | Payer: Medicare HMO | Source: Ambulatory Visit | Attending: Internal Medicine | Admitting: Internal Medicine

## 2016-02-23 DIAGNOSIS — Z1231 Encounter for screening mammogram for malignant neoplasm of breast: Secondary | ICD-10-CM

## 2016-02-24 NOTE — H&P (Signed)
Progress Notes Encounter Date: 01/27/2016 11:30 AM Lonn Georgia, PA-C  Cardiology  Expand All Collapse All   _0 Hide copied text     Cardiology Office Note   Date:  01/27/2016   ID:  Melinda [name], DOB [birth year], MRN [id number]  PCP:  Jerlyn Ly, MD    Cardiologist:  Dr Martinique   Malon Branton, PA-C 11/13/2015      Chief Complaint  Patient presents with  . Follow-up    pt states no chest pain     History of Present Illness: Melinda [name] is a 82 y.o. female with a history of PAF on Eliquis, non-obs CAD w/ PAH 07/2015 cath, EF nl by echo w/ +LVH but unable to est diast dysf, PAS 50. Hx DM, HTN, HLD, GERD, anxiety, OSA on CPAP with O2, CHA2DS2VASc=5 (HTN, CAD, female,age x 2) .  08/29 DCCV>SR  09/07 ofc visit, in [place], [place] improving 11/21, phone notes w/ pt reporting palpitaitons>>ofc visit scheduled  Melinda [name] presents for evaluation of palpitations.  She was doing well until 11/09. She tracks her BP and her CBG almost every day. Her SBP was as low as 102, but most of the time was >130. Her heart rate was high 50s, low 60s till 11/09, when she noticed her heart rate go up to approximately 100. It has been high ever since.  She has been tired. No chest pain. Not SOB at rest, but has significantly increased DOE. She has slowed down, is managing fairly well. She decided to call the office when her HR started climbing to 110 and above.   No orthopnea or PND, has daytime LE edema, L>R.   No bleeding issues. Missed one dose of Eliquis over the weekend, did not realize she had not taken her morning dose till 5 pm, just took the pm dose. 11/18 or 11/19.        Past Medical History:  Diagnosis Date  . Acid reflux   . Anxiety   . Arthritis   . Cataract    surgery,bilateral  . Constipation   . Depression   . Diabetes mellitus without complication ([place])   . Dyspnea on exertion    a. 07/2015 Echo: EF 55-60%, no rwma, mild  MR, mod dil LA/RA/RV, mod TR, triv PR, PASP 66mHg.  . Family history of adverse reaction to anesthesia    mother always had n/v  . Hx: UTI (urinary tract infection)   . Hypercholesterolemia   . Hypertensive heart disease   . Insomnia   . Multifactorial gait disorder   . Non-obstructive CAD    a. 07/2015 Cath: LM 30-40d, LAD min irregs, LCX min irregs, RCA 30p, RA 17, RV 33/7, PA 42/19, PCWP 17.  . Osteoarthritis    a. 06/2015 s/p R total hip arthroplasty.  .Marland KitchenPAF (paroxysmal atrial fibrillation) ([facility]    a. Dx 07/2015, CHA2DS2VASc = 6-->Eliquis.  . Premature atrial contractions   . Pulmonary hypertension    a. 07/2015 Echo: PASP 530mg.  . Marland Kitchenyloric antral stenosis    in childhood,infancy  . Sleep apnea with use of continuous positive airway pressure (CPAP) 10/19/2012   a. compliant w/ CPAP.         Past Surgical History:  Procedure Laterality Date  . ABDOMINAL HYSTERECTOMY  1970  . ABDOMINAL SURGERY     as a [age]ay old baby  . CARDIAC CATHETERIZATION N/A 07/14/2015   Procedure: Right/Left Heart Cath and Coronary Angiography;  Surgeon: MiSanda KleinMD;  Location: [facility];  Service: Cardiovascular;  Laterality: N/A;  . CARDIOVERSION N/A 11/04/2015   Procedure: CARDIOVERSION;  Surgeon: Lelon Perla, MD;  Location: [facility] ENDOSCOPY;  Service: Cardiovascular;  Laterality: N/A;  . COLONOSCOPY    . EYE SURGERY Bilateral    cataract surgery with lens implant  . INNER EAR SURGERY Left    x 3  . LAPAROSCOPIC SALPINGO OOPHERECTOMY    . NM MYOVIEW LTD  11/30/2007   normal stress nuclear study/EF-83%  . TONSILLECTOMY    . TOTAL HIP ARTHROPLASTY Right 06/26/2015   Procedure: RIGHT TOTAL HIP ARTHROPLASTY ANTERIOR APPROACH;  Surgeon: Leandrew Koyanagi, MD;  Location: [facility];  Service: Orthopedics;  Laterality: Right;  . TRANSTHORACIC ECHOCARDIOGRAM  07/23/2009   mild concentric LVH/mild mitral insufficiency/ moderate tricuspid innsufficiency/mild  pulmonary HTN/EF- 55-60%  . VAGINAL DELIVERY      Current Outpatient Prescriptions  Medication Sig Dispense Refill  . acetaminophen (TYLENOL) 650 MG CR tablet Take 650 mg by mouth every 8 (eight) hours as needed for pain.    [name] amLODipine (NORVASC) 5 MG tablet Take 5 mg by mouth daily.    [name] apixaban (ELIQUIS) 5 MG TABS tablet Take 1 tablet (5 mg total) by mouth 2 (two) times daily. 60 tablet 0  . atorvastatin (LIPITOR) 40 MG tablet Take 40 mg by mouth daily. One tablet in am    . benzonatate (TESSALON) 100 MG capsule Take 1-2 capsules (100-200 mg total) by mouth every 8 (eight) hours. 21 capsule 0  . Blood Glucose Monitoring Suppl (ONE TOUCH ULTRA 2) W/DEVICE KIT Reported on 07/11/2015    . Calcium Carbonate-Vitamin D3 (CALCIUM 600+D3) 600-400 MG-UNIT TABS Take 1 tablet by mouth daily.    . cholecalciferol (VITAMIN D) 1000 UNITS tablet Take 1,000 Units by mouth daily. D3    . desipramine (NORPRAMIN) 25 MG tablet Take 25 mg by mouth daily.    [name] glucosamine-chondroitin 500-400 MG tablet Take 1 tablet by mouth every other day.     [name] glucose blood (ACCU-CHEK ACTIVE STRIPS) test strip 1 each by Other route as needed for other. Reported on 07/11/2015    . linaclotide (LINZESS) 145 MCG CAPS capsule Take 145 mcg by mouth daily before breakfast.     . LORazepam (ATIVAN) 0.5 MG tablet Take 1 tablet by mouth at bedtime.  5  . metoprolol (LOPRESSOR) 50 MG tablet Take 25 mg by mouth 2 (two) times daily. Take 1/2 tablet by mouth twice daily    . omeprazole (PRILOSEC) 20 MG capsule Take 20 mg by mouth daily. One tablet daily in mornings    . ONETOUCH DELICA LANCETS 93A MISC Reported on 07/11/2015    . polyethylene glycol (MIRALAX / GLYCOLAX) packet Take 17 g by mouth 2 (two) times daily.    . predniSONE (DELTASONE) 20 MG tablet 2 tabs po daily x 3 days, 1 tab for 2 days 8 tablet 0  . vitamin B-12 (CYANOCOBALAMIN) 500 MCG tablet Take 1,000 mcg by mouth daily.      No current  facility-administered medications for this visit.     Allergies:   Lasix [furosemide] and Phenobarbital    Social History:  The patient  reports that she has never smoked. She has never used smokeless tobacco. She reports that she does not drink alcohol or use drugs.   Family History:  The patient's family history includes Cancer in her mother; Diabetes in her sister; Heart attack in her father; Heart failure in her mother and  sister; Hypertension in her sister.    ROS:  Please see the history of present illness. All other systems are reviewed and negative.    PHYSICAL EXAM: VS:  BP (!) 141/93   Pulse (!) 115   Ht 5' (1.524 m)   Wt 168 lb 9.6 oz (76.5 kg)   SpO2 95%   BMI 32.93 kg/m  , BMI Body mass index is 32.93 kg/m. GEN: Well nourished, well developed, female in no acute distress  HEENT: normal for age  Neck: minimal JVD, no carotid bruit, no masses Cardiac: Irreg R&R; no murmur, no rubs, or gallops Respiratory:  clear to auscultation bilaterally, normal work of breathing GI: soft, nontender, nondistended, + BS MS: no deformity or atrophy; 1+ edema; distal pulses are 2+ in all 4 extremities   Skin: warm and dry, no rash Neuro:  Strength and sensation are intact Psych: euthymic mood, full affect   EKG:  EKG is ordered today. The ekg ordered today demonstrates Atrial fib, RVR w/ HR 113.   ECHO: 07/2015 Left ventricle: The cavity size was normal. There was mild concentric hypertrophy. Systolic function was normal. The estimated ejection fraction was in the range of 55% to 60%. Wall motion was normal; there were no regional wall motionabnormalities. - Aortic valve: Transvalvular velocity was within the normal range. There was no stenosis. There was no regurgitation. - Mitral valve: Calcified annulus. Mildly thickened leaflets . There was mild regurgitation. - Left atrium: The atrium was moderately dilated. - Right ventricle: The cavity size was  moderately dilated. Wall thickness was normal. - Right atrium: The atrium was moderately dilated. - Tricuspid valve: There was moderate regurgitation. - Pulmonic valve: There was trivial regurgitation. - Pulmonary arteries: Systolic pressure was moderately increased. PA peak pressure: 50 mm Hg (S). - Inferior vena cava: The vessel was dilated. The respirophasic diameter changes were blunted (<50%), consistent with elevated central venous pressure. - Pericardium, extracardiac: There was no pericardial effusion.   Recent Labs: 07/14/2015: ALT 14; B Natriuretic Peptide 640.5 10/29/2015: BUN 21; Creat 0.91; Hemoglobin 13.5; Platelets 222; Potassium 4.3; Sodium 138    Lipid Panel Labs(Brief)  No results found for: CHOL, TRIG, HDL, CHOLHDL, VLDL, LDLCALC, LDLDIRECT        Wt Readings from Last 3 Encounters:  01/27/16 168 lb 9.6 oz (76.5 kg)  12/17/15 167 lb (75.8 kg)  11/13/15 163 lb (73.9 kg)     Other studies Reviewed: Additional studies/ records that were reviewed today include: Office notes and other testing.  ASSESSMENT AND PLAN:  1.  PAFlutter: There was no precipitating event. No recent illness or problem. Her heart rate is elevated so will increase her BB. However, when in SR, her heart rate is in the high 50s, so she will have to cut back to the previous dose if she goes back into SR.  Plan DCCV in 4 weeks since she missed a dose of Eliquis recently, unless Dr Martinique feels it can be done sooner.   Discuss with MD if pt would be a candidate for Flecainide "pill in pocket" approach for future occurrences.  2. HTN: Management per Dr Joylene Draft, but believe her BP will tolerate an increase in the BB while in atrial flutter  3. Chronic anticoagulation: she did miss a dose of Eliquis over the weekend, she is not sure which day. She is generally compliant and this was emphasized.    Current medicines are reviewed at length with the patient today.  The patient  does not  have concerns regarding medicines.  The following changes have been made:  Increase metoprolol while in atrial flutter  Labs/ tests ordered today include:      Orders Placed This Encounter  Procedures  . EKG 12-Lead     Disposition:   FU with Dr Martinique  Signed, Rosaria Ferries, PA-C  01/27/2016 3:52 PM    [facility] Phone: [phone]; Fax: [phone]  This note was written with the assistance of speech recognition software. Please excuse any transcriptional errors.

## 2016-02-25 ENCOUNTER — Encounter (HOSPITAL_COMMUNITY)
Admission: RE | Disposition: A | Discharge status: Home / Self Care | Payer: Self-pay | Source: Ambulatory Visit | Attending: Internal Medicine

## 2016-02-25 ENCOUNTER — Ambulatory Visit (HOSPITAL_COMMUNITY): Payer: Medicare HMO | Admitting: Anesthesiology

## 2016-02-25 ENCOUNTER — Encounter (HOSPITAL_COMMUNITY): Payer: Self-pay | Admitting: *Deleted

## 2016-02-25 ENCOUNTER — Ambulatory Visit (HOSPITAL_COMMUNITY)
Admission: RE | Admit: 2016-02-25 | Discharge: 2016-02-25 | Disposition: A | Discharge status: Home / Self Care | Payer: Medicare HMO | Source: Ambulatory Visit | Attending: Internal Medicine | Admitting: Internal Medicine

## 2016-02-25 DIAGNOSIS — E785 Hyperlipidemia, unspecified: Secondary | ICD-10-CM | POA: Diagnosis not present

## 2016-02-25 DIAGNOSIS — E119 Type 2 diabetes mellitus without complications: Secondary | ICD-10-CM | POA: Insufficient documentation

## 2016-02-25 DIAGNOSIS — F419 Anxiety disorder, unspecified: Secondary | ICD-10-CM | POA: Diagnosis not present

## 2016-02-25 DIAGNOSIS — I2510 Atherosclerotic heart disease of native coronary artery without angina pectoris: Secondary | ICD-10-CM | POA: Insufficient documentation

## 2016-02-25 DIAGNOSIS — K5900 Constipation, unspecified: Secondary | ICD-10-CM | POA: Insufficient documentation

## 2016-02-25 DIAGNOSIS — G4733 Obstructive sleep apnea (adult) (pediatric): Secondary | ICD-10-CM | POA: Insufficient documentation

## 2016-02-25 DIAGNOSIS — Z96641 Presence of right artificial hip joint: Secondary | ICD-10-CM | POA: Diagnosis not present

## 2016-02-25 DIAGNOSIS — Z79899 Other long term (current) drug therapy: Secondary | ICD-10-CM | POA: Insufficient documentation

## 2016-02-25 DIAGNOSIS — I10 Essential (primary) hypertension: Secondary | ICD-10-CM | POA: Diagnosis not present

## 2016-02-25 DIAGNOSIS — K219 Gastro-esophageal reflux disease without esophagitis: Secondary | ICD-10-CM | POA: Insufficient documentation

## 2016-02-25 DIAGNOSIS — I4892 Unspecified atrial flutter: Secondary | ICD-10-CM | POA: Insufficient documentation

## 2016-02-25 DIAGNOSIS — I4819 Other persistent atrial fibrillation: Secondary | ICD-10-CM

## 2016-02-25 DIAGNOSIS — Z7901 Long term (current) use of anticoagulants: Secondary | ICD-10-CM | POA: Diagnosis not present

## 2016-02-25 HISTORY — PX: CARDIOVERSION: SHX1299

## 2016-02-25 LAB — GLUCOSE, CAPILLARY: Glucose-Capillary: 116 mg/dL — ABNORMAL HIGH (ref 65–99)

## 2016-02-25 SURGERY — CARDIOVERSION
Anesthesia: General

## 2016-02-25 MED ORDER — LIDOCAINE HCL (CARDIAC) 20 MG/ML IV SOLN
INTRAVENOUS | Status: DC | PRN
  Administered 2016-02-25: 30 mg via INTRATRACHEAL

## 2016-02-25 MED ORDER — SODIUM CHLORIDE 0.9 % IV SOLN
INTRAVENOUS | Status: DC
  Administered 2016-02-25: 12:00:00 via INTRAVENOUS

## 2016-02-25 MED ORDER — PROPOFOL 10 MG/ML IV BOLUS
INTRAVENOUS | Status: DC | PRN
  Administered 2016-02-25: 70 mg via INTRAVENOUS

## 2016-02-25 NOTE — Anesthesia Postprocedure Evaluation (Signed)
Anesthesia Post Note  Patient: Fleeta Ames Wilmott  Procedure(s) Performed: Procedure(s) (LRB): CARDIOVERSION (N/A)  Patient location during evaluation: Endoscopy Anesthesia Type: General Level of consciousness: awake and alert, oriented and patient cooperative Pain management: satisfactory to patient Vital Signs Assessment: post-procedure vital signs reviewed and stable Respiratory status: spontaneous breathing and patient connected to nasal cannula oxygen Cardiovascular status: blood pressure returned to baseline and stable Postop Assessment: no headache, no backache, adequate PO intake and no signs of nausea or vomiting Anesthetic complications: no       Last Vitals:  Vitals:   02/25/16 1217 02/25/16 1303  BP: (!) 143/87 (!) 99/53  Pulse: (!) 115 60  Resp: (!) 24 (!) 22    Last Pain:  Vitals:   02/25/16 1217  TempSrc: Oral                 Beyla Loney

## 2016-02-25 NOTE — Anesthesia Preprocedure Evaluation (Signed)
Anesthesia Evaluation  Patient identified by MRN, date of birth, ID band Patient awake    Reviewed: Allergy & Precautions, NPO status   Airway Mallampati: II  TM Distance: >3 FB Neck ROM: Full    Dental  (+) Teeth Intact   Pulmonary sleep apnea ,    breath sounds clear to auscultation       Cardiovascular hypertension, + CAD   Rhythm:Irregular Rate:Tachycardia     Neuro/Psych    GI/Hepatic GERD  ,  Endo/Other  diabetes  Renal/GU      Musculoskeletal   Abdominal   Peds  Hematology   Anesthesia Other Findings   Reproductive/Obstetrics                             Anesthesia Physical Anesthesia Plan  ASA: III  Anesthesia Plan: General   Post-op Pain Management:    Induction: Intravenous  Airway Management Planned: Mask  Additional Equipment:   Intra-op Plan:   Post-operative Plan: Extubation in OR  Informed Consent: I have reviewed the patients History and Physical, chart, labs and discussed the procedure including the risks, benefits and alternatives for the proposed anesthesia with the patient or authorized representative who has indicated his/her understanding and acceptance.     Plan Discussed with:   Anesthesia Plan Comments:         Anesthesia Quick Evaluation

## 2016-02-25 NOTE — CV Procedure (Signed)
    CARDIOVERSION NOTE  Procedure: Electrical Cardioversion Indications:  Atrial Flutter  Procedure Details:  Consent: Risks of procedure as well as the alternatives and risks of each were explained to the (patient/caregiver).  Consent for procedure obtained.  Time Out: Verified patient identification, verified procedure, site/side was marked, verified correct patient position, special equipment/implants available, medications/allergies/relevent history reviewed, required imaging and test results available.  Performed  Patient placed on cardiac monitor, pulse oximetry, supplemental oxygen as necessary.  Sedation given: Propofol per anesthesia Pacer pads placed anterior and posterior chest.  Cardioverted 1 time(s).  Cardioverted at 120J biphasic.  Impression: Findings: Post procedure EKG shows: NSR Complications: None Patient did tolerate procedure well.  Plan: 1. Successful DCCV to NSR with a single 120J biphasic shock.  Time Spent Directly with the Patient:  30 minutes   Pixie Casino, MD, [facility] Attending Cardiologist [place] 02/25/2016, 1:47 PM

## 2016-02-25 NOTE — Discharge Instructions (Signed)
Electrical Cardioversion, Care After This sheet gives you information about how to care for yourself after your procedure. Your health care provider may also give you more specific instructions. If you have problems or questions, contact your health care provider. What can I expect after the procedure? After the procedure, it is common to have:  Some redness on the skin where the shocks were given. Follow these instructions at home:  Do not drive for 24 hours if you were given a medicine to help you relax (sedative).  Take over-the-counter and prescription medicines only as told by your health care provider.  Ask your health care provider how to check your pulse. Check it often.  Rest for 48 hours after the procedure or as told by your health care provider.  Avoid or limit your caffeine use as told by your health care provider. Contact a health care provider if:  You feel like your heart is beating too quickly or your pulse is not regular.  You have a serious muscle cramp that does not go away. Get help right away if:  You have discomfort in your chest.  You are dizzy or you feel faint.  You have trouble breathing or you are short of breath.  Your speech is slurred.  You have trouble moving an arm or leg on one side of your body.  Your fingers or toes turn cold or blue. This information is not intended to replace advice given to you by your health care provider. Make sure you discuss any questions you have with your health care provider. Document Released: 12/13/2012 Document Revised: 09/26/2015 Document Reviewed: 08/29/2015 Elsevier Interactive Patient Education  2017 [place], Care After These instructions provide you with information about caring for yourself after your procedure. Your health care provider may also give you more specific instructions. Your treatment has been planned  according to current medical practices, but problems sometimes occur. Call your health care provider if you have any problems or questions after your procedure. What can I expect after the procedure? After your procedure, it is common to:  Feel sleepy for several hours.  Feel clumsy and have poor balance for several hours.  Feel forgetful about what happened after the procedure.  Have poor judgment for several hours.  Feel nauseous or vomit.  Have a sore throat if you had a breathing tube during the procedure. Follow these instructions at home: For at least 24 hours after the procedure:   Do not:  Participate in activities in which you could fall or become injured.  Drive.  Use heavy machinery.  Drink alcohol.  Take sleeping pills or medicines that cause drowsiness.  Make important decisions or sign legal documents.  Take care of children on your own.  Rest. Eating and drinking  Follow the diet that is recommended by your health care provider.  If you vomit, drink water, juice, or soup when you can drink without vomiting.  Make sure you have little or no nausea before eating solid foods. General instructions  Have a responsible adult stay with you until you are awake and alert.  Take over-the-counter and prescription medicines only as told by your health care provider.  If you smoke, do not smoke without supervision.  Keep all follow-up visits as told by your health care provider. This is important.  Contact a health care provider if:  You keep feeling nauseous or you keep vomiting.  You feel light-headed.  You develop a rash.  You have a fever. Get help right away if:  You have trouble breathing. This information is not intended to replace advice given to you by your health care provider. Make sure you discuss any questions you have with your health care provider. Document Released: 06/15/2015 Document Revised: 10/15/2015 Document Reviewed:  06/15/2015 Elsevier Interactive Patient Education  2017 [employer].

## 2016-02-25 NOTE — Transfer of Care (Signed)
Immediate Anesthesia Transfer of Care Note  Patient: Melinda [name]  Procedure(s) Performed: Procedure(s): CARDIOVERSION (N/A)  Patient Location: Endoscopy Unit  Anesthesia Type:General  Level of Consciousness: awake, alert , oriented and patient cooperative  Airway & Oxygen Therapy: Patient Spontanous Breathing and Patient connected to nasal cannula oxygen  Post-op Assessment: Report given to RN and Post -op Vital signs reviewed and stable  Post vital signs: Reviewed and stable  Last Vitals:  Vitals:   02/25/16 1217 02/25/16 1303  BP: (!) 143/87 (!) 99/53  Pulse: (!) 115 60  Resp: (!) 24 (!) 22    Last Pain:  Vitals:   02/25/16 1217  TempSrc: Oral         Complications: No apparent anesthesia complications

## 2016-02-26 ENCOUNTER — Other Ambulatory Visit: Payer: Self-pay | Admitting: Internal Medicine

## 2016-02-26 DIAGNOSIS — R928 Other abnormal and inconclusive findings on diagnostic imaging of breast: Secondary | ICD-10-CM

## 2016-02-29 ENCOUNTER — Emergency Department (HOSPITAL_COMMUNITY): Payer: Medicare HMO

## 2016-02-29 ENCOUNTER — Inpatient Hospital Stay (HOSPITAL_COMMUNITY)
Admission: EM | Admit: 2016-02-29 | Discharge: 2016-03-02 | DRG: 291 | Disposition: A | Discharge status: Home / Self Care | Payer: Medicare HMO | Attending: Internal Medicine | Admitting: Internal Medicine

## 2016-02-29 ENCOUNTER — Encounter (HOSPITAL_COMMUNITY): Payer: Self-pay | Admitting: Emergency Medicine

## 2016-02-29 DIAGNOSIS — I272 Pulmonary hypertension, unspecified: Secondary | ICD-10-CM | POA: Diagnosis present

## 2016-02-29 DIAGNOSIS — J9601 Acute respiratory failure with hypoxia: Secondary | ICD-10-CM | POA: Diagnosis present

## 2016-02-29 DIAGNOSIS — R0902 Hypoxemia: Secondary | ICD-10-CM | POA: Diagnosis not present

## 2016-02-29 DIAGNOSIS — K219 Gastro-esophageal reflux disease without esophagitis: Secondary | ICD-10-CM | POA: Diagnosis present

## 2016-02-29 DIAGNOSIS — F419 Anxiety disorder, unspecified: Secondary | ICD-10-CM | POA: Diagnosis present

## 2016-02-29 DIAGNOSIS — Z79899 Other long term (current) drug therapy: Secondary | ICD-10-CM

## 2016-02-29 DIAGNOSIS — Z96641 Presence of right artificial hip joint: Secondary | ICD-10-CM | POA: Diagnosis present

## 2016-02-29 DIAGNOSIS — E119 Type 2 diabetes mellitus without complications: Secondary | ICD-10-CM | POA: Diagnosis present

## 2016-02-29 DIAGNOSIS — I10 Essential (primary) hypertension: Secondary | ICD-10-CM

## 2016-02-29 DIAGNOSIS — G4733 Obstructive sleep apnea (adult) (pediatric): Secondary | ICD-10-CM

## 2016-02-29 DIAGNOSIS — F329 Major depressive disorder, single episode, unspecified: Secondary | ICD-10-CM | POA: Diagnosis present

## 2016-02-29 DIAGNOSIS — Z961 Presence of intraocular lens: Secondary | ICD-10-CM | POA: Diagnosis present

## 2016-02-29 DIAGNOSIS — Z9079 Acquired absence of other genital organ(s): Secondary | ICD-10-CM

## 2016-02-29 DIAGNOSIS — I480 Paroxysmal atrial fibrillation: Secondary | ICD-10-CM | POA: Diagnosis present

## 2016-02-29 DIAGNOSIS — G4700 Insomnia, unspecified: Secondary | ICD-10-CM | POA: Diagnosis present

## 2016-02-29 DIAGNOSIS — Z833 Family history of diabetes mellitus: Secondary | ICD-10-CM

## 2016-02-29 DIAGNOSIS — J181 Lobar pneumonia, unspecified organism: Secondary | ICD-10-CM

## 2016-02-29 DIAGNOSIS — Z8249 Family history of ischemic heart disease and other diseases of the circulatory system: Secondary | ICD-10-CM

## 2016-02-29 DIAGNOSIS — Z9989 Dependence on other enabling machines and devices: Secondary | ICD-10-CM

## 2016-02-29 DIAGNOSIS — Z7901 Long term (current) use of anticoagulants: Secondary | ICD-10-CM

## 2016-02-29 DIAGNOSIS — Z90710 Acquired absence of both cervix and uterus: Secondary | ICD-10-CM

## 2016-02-29 DIAGNOSIS — E7800 Pure hypercholesterolemia, unspecified: Secondary | ICD-10-CM | POA: Diagnosis present

## 2016-02-29 DIAGNOSIS — I110 Hypertensive heart disease with heart failure: Principal | ICD-10-CM | POA: Diagnosis present

## 2016-02-29 DIAGNOSIS — Z888 Allergy status to other drugs, medicaments and biological substances status: Secondary | ICD-10-CM

## 2016-02-29 DIAGNOSIS — I2510 Atherosclerotic heart disease of native coronary artery without angina pectoris: Secondary | ICD-10-CM | POA: Diagnosis present

## 2016-02-29 DIAGNOSIS — E785 Hyperlipidemia, unspecified: Secondary | ICD-10-CM | POA: Diagnosis not present

## 2016-02-29 DIAGNOSIS — I5033 Acute on chronic diastolic (congestive) heart failure: Secondary | ICD-10-CM

## 2016-02-29 DIAGNOSIS — J189 Pneumonia, unspecified organism: Secondary | ICD-10-CM | POA: Diagnosis present

## 2016-02-29 DIAGNOSIS — I509 Heart failure, unspecified: Secondary | ICD-10-CM | POA: Diagnosis not present

## 2016-02-29 DIAGNOSIS — Z90722 Acquired absence of ovaries, bilateral: Secondary | ICD-10-CM

## 2016-02-29 LAB — BASIC METABOLIC PANEL
Anion gap: 7 (ref 5–15)
BUN: 16 mg/dL (ref 6–20)
CALCIUM: 8.6 mg/dL — AB (ref 8.9–10.3)
CO2: 25 mmol/L (ref 22–32)
CREATININE: 0.81 mg/dL (ref 0.44–1.00)
Chloride: 104 mmol/L (ref 101–111)
Glucose, Bld: 136 mg/dL — ABNORMAL HIGH (ref 65–99)
Potassium: 4 mmol/L (ref 3.5–5.1)
SODIUM: 136 mmol/L (ref 135–145)

## 2016-02-29 LAB — CBC WITH DIFFERENTIAL/PLATELET
BASOS ABS: 0 10*3/uL (ref 0.0–0.1)
BASOS PCT: 0 %
EOS ABS: 0 10*3/uL (ref 0.0–0.7)
EOS PCT: 0 %
HCT: 36.6 % (ref 36.0–46.0)
Hemoglobin: 12 g/dL (ref 12.0–15.0)
LYMPHS ABS: 0.8 10*3/uL (ref 0.7–4.0)
Lymphocytes Relative: 9 %
MCH: 30.8 pg (ref 26.0–34.0)
MCHC: 32.8 g/dL (ref 30.0–36.0)
MCV: 93.8 fL (ref 78.0–100.0)
Monocytes Absolute: 0.8 10*3/uL (ref 0.1–1.0)
Monocytes Relative: 9 %
Neutro Abs: 7.2 10*3/uL (ref 1.7–7.7)
Neutrophils Relative %: 82 %
PLATELETS: 171 10*3/uL (ref 150–400)
RBC: 3.9 MIL/uL (ref 3.87–5.11)
RDW: 13.2 % (ref 11.5–15.5)
WBC: 8.8 10*3/uL (ref 4.0–10.5)

## 2016-02-29 LAB — PROCALCITONIN

## 2016-02-29 LAB — I-STAT TROPONIN, ED: TROPONIN I, POC: 0.02 ng/mL (ref 0.00–0.08)

## 2016-02-29 LAB — BRAIN NATRIURETIC PEPTIDE: B Natriuretic Peptide: 948.9 pg/mL — ABNORMAL HIGH (ref 0.0–100.0)

## 2016-02-29 LAB — TROPONIN I

## 2016-02-29 MED ORDER — LINACLOTIDE 145 MCG PO CAPS
145.0000 ug | ORAL_CAPSULE | Freq: Every day | ORAL | Status: DC
  Administered 2016-03-01 – 2016-03-02 (×2): 145 ug via ORAL
  Filled 2016-02-29 (×3): qty 1

## 2016-02-29 MED ORDER — VITAMIN B-12 1000 MCG PO TABS
1000.0000 ug | ORAL_TABLET | Freq: Every day | ORAL | Status: DC
  Administered 2016-03-01 – 2016-03-02 (×2): 1000 ug via ORAL
  Filled 2016-02-29 (×2): qty 1

## 2016-02-29 MED ORDER — METOPROLOL TARTRATE 25 MG PO TABS
25.0000 mg | ORAL_TABLET | Freq: Two times a day (BID) | ORAL | Status: DC
Start: 2016-02-29 — End: 2016-03-02
  Administered 2016-02-29 – 2016-03-01 (×3): 25 mg via ORAL
  Filled 2016-02-29 (×3): qty 1

## 2016-02-29 MED ORDER — DEXTROSE 5 % IV SOLN
500.0000 mg | Freq: Every day | INTRAVENOUS | Status: DC
  Administered 2016-03-01 (×2): 500 mg via INTRAVENOUS
  Filled 2016-02-29 (×3): qty 500

## 2016-02-29 MED ORDER — SODIUM CHLORIDE 0.9% FLUSH
3.0000 mL | Freq: Two times a day (BID) | INTRAVENOUS | Status: DC
  Administered 2016-03-01 – 2016-03-02 (×4): 3 mL via INTRAVENOUS

## 2016-02-29 MED ORDER — DESIPRAMINE HCL 25 MG PO TABS
25.0000 mg | ORAL_TABLET | Freq: Every day | ORAL | Status: DC
  Administered 2016-03-01 – 2016-03-02 (×2): 25 mg via ORAL
  Filled 2016-02-29 (×2): qty 1

## 2016-02-29 MED ORDER — SODIUM CHLORIDE 0.9 % IV SOLN: 250.0000 mL | INTRAVENOUS | Status: DC | PRN

## 2016-02-29 MED ORDER — ACETAMINOPHEN 325 MG PO TABS: 650.0000 mg | ORAL_TABLET | ORAL | Status: DC | PRN

## 2016-02-29 MED ORDER — IPRATROPIUM-ALBUTEROL 0.5-2.5 (3) MG/3ML IN SOLN
3.0000 mL | RESPIRATORY_TRACT | Status: AC
  Administered 2016-03-01: 3 mL via RESPIRATORY_TRACT
  Filled 2016-02-29: qty 3

## 2016-02-29 MED ORDER — IPRATROPIUM-ALBUTEROL 0.5-2.5 (3) MG/3ML IN SOLN: 3.0000 mL | RESPIRATORY_TRACT | Status: DC | PRN

## 2016-02-29 MED ORDER — ATORVASTATIN CALCIUM 40 MG PO TABS
40.0000 mg | ORAL_TABLET | Freq: Every day | ORAL | Status: DC
  Administered 2016-03-01 – 2016-03-02 (×2): 40 mg via ORAL
  Filled 2016-02-29 (×2): qty 1

## 2016-02-29 MED ORDER — TORSEMIDE 20 MG PO TABS
20.0000 mg | ORAL_TABLET | Freq: Every day | ORAL | Status: DC
  Administered 2016-02-29 – 2016-03-02 (×3): 20 mg via ORAL
  Filled 2016-02-29 (×3): qty 1

## 2016-02-29 MED ORDER — SODIUM CHLORIDE 0.9% FLUSH: 3.0000 mL | INTRAVENOUS | Status: DC | PRN

## 2016-02-29 MED ORDER — DIPHENHYDRAMINE HCL 50 MG/ML IJ SOLN: 25.0000 mg | Freq: Four times a day (QID) | INTRAMUSCULAR | Status: DC | PRN

## 2016-02-29 MED ORDER — POLYETHYLENE GLYCOL 3350 17 G PO PACK: 17.0000 g | PACK | Freq: Every day | ORAL | Status: DC | PRN

## 2016-02-29 MED ORDER — HYPROMELLOSE (GONIOSCOPIC) 2.5 % OP SOLN
1.0000 [drp] | Freq: Every evening | OPHTHALMIC | Status: DC | PRN
  Filled 2016-02-29: qty 15

## 2016-02-29 MED ORDER — AMLODIPINE BESYLATE 5 MG PO TABS
5.0000 mg | ORAL_TABLET | Freq: Every day | ORAL | Status: DC
  Administered 2016-03-01 – 2016-03-02 (×2): 5 mg via ORAL
  Filled 2016-02-29 (×2): qty 1

## 2016-02-29 MED ORDER — ONDANSETRON HCL 4 MG/2ML IJ SOLN: 4.0000 mg | Freq: Four times a day (QID) | INTRAMUSCULAR | Status: DC | PRN

## 2016-02-29 MED ORDER — GUAIFENESIN ER 600 MG PO TB12
600.0000 mg | ORAL_TABLET | Freq: Two times a day (BID) | ORAL | Status: DC
  Administered 2016-03-01 – 2016-03-02 (×4): 600 mg via ORAL
  Filled 2016-02-29 (×4): qty 1

## 2016-02-29 MED ORDER — BENAZEPRIL HCL 20 MG PO TABS
20.0000 mg | ORAL_TABLET | Freq: Every day | ORAL | Status: DC
  Administered 2016-03-01: 20 mg via ORAL
  Filled 2016-02-29 (×2): qty 1

## 2016-02-29 MED ORDER — DEXTROSE 5 % IV SOLN
1.0000 g | Freq: Every day | INTRAVENOUS | Status: DC
Start: 2016-02-29 — End: 2016-03-02
  Administered 2016-03-01 (×2): 1 g via INTRAVENOUS
  Filled 2016-02-29 (×3): qty 10

## 2016-02-29 MED ORDER — LORAZEPAM 0.5 MG PO TABS
0.5000 mg | ORAL_TABLET | Freq: Every evening | ORAL | Status: DC | PRN
  Administered 2016-02-29 – 2016-03-02 (×2): 0.5 mg via ORAL
  Filled 2016-02-29 (×2): qty 1

## 2016-02-29 MED ORDER — APIXABAN 5 MG PO TABS
5.0000 mg | ORAL_TABLET | Freq: Two times a day (BID) | ORAL | Status: DC
  Administered 2016-02-29 – 2016-03-02 (×4): 5 mg via ORAL
  Filled 2016-02-29 (×4): qty 1

## 2016-02-29 MED ORDER — PANTOPRAZOLE SODIUM 40 MG PO TBEC
40.0000 mg | DELAYED_RELEASE_TABLET | Freq: Every day | ORAL | Status: DC
  Administered 2016-03-01 – 2016-03-02 (×2): 40 mg via ORAL
  Filled 2016-02-29 (×2): qty 1

## 2016-02-29 NOTE — ED Triage Notes (Signed)
Per [facility] patient from home called out for shortness of breath, initial O2 sat in 80s.  After 4L O2 [place], 98%.  324 mg aspirin, patient states after 1 SL nitro shortness of breath has mostly resolved.  20g IV saline lock in left forearm.

## 2016-02-29 NOTE — H&P (Signed)
History and Physical    Melinda [name] Melinda [name]:[id number] DOB: [birth year] DOA: 02/29/2016  Referring MD/NP/PA: Laneta Simmers PCP: Jerlyn Ly, MD  Patient coming from: Home  Chief Complaint: Shortness of breath  HPI: Melinda [name] is a 82 y.o. female with medical history significant of  HTN, HLD, CAD, DM type II, PAF on Eliquis; who presents with a reported several month history of intermittent shortness of breath that acutely worsened over the last few days. She previously noticed shortness of breath symptoms when significantly exerting herself such as walking upstairs or walking long distances. However, over the last few days  she could not walk even 2-3 steps without having to pause catch her breath.  patient denies trying anything to is a symptoms. States that she's only on oxygen with her CPAP mask at night. Associated symptoms included fatigue, 2 pillow orthopnea, lower extremity swelling, wheezing, dry cough, and chills. Patient just underwent cardioversion for atrial fibrillation 4 days ago. This was her second time having this procedure done. She reports taking her Eliquis regularly. She is followed by Dr. Peter Martinique of cardiology in the outpatient setting. She's had no other recent changes to her medication regimen. Denies having any chest pain, fever, dysuria, urgency, abdominal pain, nausea, vomiting, palpitations, or loss of consciousness.  ED Course:  Patient to emergency department patient was found to be afebrile, respirations up to 26, and hypoxic on room air with oxygen saturations as low as 84%. On 2 L of nasal cannula oxygen saturations improved to > 90%. Lab work was remarkable for BNP of 948.9. Chest x-ray show signs of a mild left lower lobe infiltrate. Patient was given 20 mg of furosemide po due to allergy of Lasix.  Review of Systems: As per HPI otherwise 10 point review of systems negative.   Past Medical History:  Diagnosis Date  . Acid reflux   . Anxiety   .  Arthritis   . Cataract    surgery,bilateral  . Constipation   . Depression   . Diabetes mellitus without complication ([place])   . Dyspnea on exertion    a. 07/2015 Echo: EF 55-60%, no rwma, mild MR, mod dil LA/RA/RV, mod TR, triv PR, PASP 34mHg.  . Family history of adverse reaction to anesthesia    mother always had n/v  . Hx: UTI (urinary tract infection)   . Hypercholesterolemia   . Hypertensive heart disease   . Insomnia   . Multifactorial gait disorder   . Non-obstructive CAD    a. 07/2015 Cath: LM 30-40d, LAD min irregs, LCX min irregs, RCA 30p, RA 17, RV 33/7, PA 42/19, PCWP 17.  . Osteoarthritis    a. 06/2015 s/p R total hip arthroplasty.  .Marland KitchenPAF (paroxysmal atrial fibrillation) ([place]    a. Dx 07/2015, CHA2DS2VASc = 6-->Eliquis.  . Premature atrial contractions   . Pulmonary hypertension    a. 07/2015 Echo: PASP 523mg.  . Marland Kitchenyloric antral stenosis    in childhood,infancy  . Sleep apnea with use of continuous positive airway pressure (CPAP) 10/19/2012   a. compliant w/ CPAP.    Past Surgical History:  Procedure Laterality Date  . ABDOMINAL HYSTERECTOMY  1970  . ABDOMINAL SURGERY     as a [age]ay old baby  . CARDIAC CATHETERIZATION N/A 07/14/2015   Procedure: Right/Left Heart Cath and Coronary Angiography;  Surgeon: MiSanda KleinMD;  [facility];  Service: Cardiovascular;  Laterality: N/A;  . CARDIOVERSION N/A 11/04/2015   Procedure: CARDIOVERSION;  Surgeon:  Lelon Perla, MD;  Location: [facility] ENDOSCOPY;  Service: Cardiovascular;  Laterality: N/A;  . CARDIOVERSION N/A 02/25/2016   Procedure: CARDIOVERSION;  Surgeon: Pixie Casino, MD;  Location: [facility] ENDOSCOPY;  Service: Cardiovascular;  Laterality: N/A;  . COLONOSCOPY    . EYE SURGERY Bilateral    cataract surgery with lens implant  . INNER EAR SURGERY Left    x 3  . LAPAROSCOPIC SALPINGO OOPHERECTOMY    . NM MYOVIEW LTD  11/30/2007   normal stress nuclear study/EF-83%  . TONSILLECTOMY    . TOTAL HIP  ARTHROPLASTY Right 06/26/2015   Procedure: RIGHT TOTAL HIP ARTHROPLASTY ANTERIOR APPROACH;  Surgeon: Leandrew Koyanagi, MD;  Location: [place];  Service: Orthopedics;  Laterality: Right;  . TRANSTHORACIC ECHOCARDIOGRAM  07/23/2009   mild concentric LVH/mild mitral insufficiency/ moderate tricuspid innsufficiency/mild pulmonary HTN/EF- 55-60%  . VAGINAL DELIVERY       reports that she has never smoked. She has never used smokeless tobacco. She reports that she does not drink alcohol or use drugs.  Allergies  Allergen Reactions  . Lasix [Furosemide] Itching  . Phenobarbital Itching    Family History  Problem Relation Age of Onset  . Heart attack Father   . Heart failure Mother   . Cancer Mother     colon,kidney  . Diabetes Sister   . Hypertension Sister   . Heart failure Sister     Prior to Admission medications   Medication Sig Start Date End Date Taking? Authorizing Provider  acetaminophen (TYLENOL) 650 MG CR tablet Take 650 mg by mouth every 8 (eight) hours as needed for pain.    Historical Provider, MD  amLODipine (NORVASC) 5 MG tablet Take 5 mg by mouth daily.    Historical Provider, MD  apixaban (ELIQUIS) 5 MG TABS tablet Take 1 tablet (5 mg total) by mouth 2 (two) times daily. 07/15/15   Robbie Lis, MD  atorvastatin (LIPITOR) 40 MG tablet Take 40 mg by mouth daily. One tablet in am    Historical Provider, MD  benzonatate (TESSALON) 100 MG capsule Take 1-2 capsules (100-200 mg total) by mouth every 8 (eight) hours. 12/17/15   Noland Fordyce, PA-C  Blood Glucose Monitoring Suppl (ONE TOUCH ULTRA 2) W/DEVICE KIT Reported on 07/11/2015 06/21/13   Historical Provider, MD  Calcium Carbonate-Vitamin D3 (CALCIUM 600+D3) 600-400 MG-UNIT TABS Take 1 tablet by mouth daily.    Historical Provider, MD  cholecalciferol (VITAMIN D) 1000 UNITS tablet Take 1,000 Units by mouth daily. D3    Historical Provider, MD  desipramine (NORPRAMIN) 25 MG tablet Take 25 mg by mouth daily.    Historical Provider, MD    glucosamine-chondroitin 500-400 MG tablet Take 1 tablet by mouth every other day.     Historical Provider, MD  glucose blood (ACCU-CHEK ACTIVE STRIPS) test strip 1 each by Other route as needed for other. Reported on 07/11/2015    Historical Provider, MD  linaclotide (LINZESS) 145 MCG CAPS capsule Take 145 mcg by mouth daily before breakfast.     Historical Provider, MD  LORazepam (ATIVAN) 0.5 MG tablet Take 1 tablet by mouth at bedtime. 12/26/15   Historical Provider, MD  metoprolol (LOPRESSOR) 50 MG tablet Take 1 tablet (50 mg total) by mouth 2 (two) times daily. As directed. 01/27/16   Rhonda G Barrett, PA-C  omeprazole (PRILOSEC) 20 MG capsule Take 20 mg by mouth daily. One tablet daily in mornings    Historical Provider, MD  [facility] DELICA LANCETS 47S MISC Reported on  07/11/2015 06/21/13   Historical Provider, MD  polyethylene glycol (MIRALAX / GLYCOLAX) packet Take 17 g by mouth 2 (two) times daily.    Historical Provider, MD  predniSONE (DELTASONE) 20 MG tablet 2 tabs po daily x 3 days, 1 tab for 2 days 12/17/15   Noland Fordyce, PA-C  vitamin B-12 (CYANOCOBALAMIN) 500 MCG tablet Take 1,000 mcg by mouth daily.     Historical Provider, MD    Physical Exam:    Constitutional: Elderly female in mild respiratory distress, but nontoxic and able to follow commands Vitals:   02/29/16 1830 02/29/16 2024  BP: 131/80   Pulse: 64   Resp: 26   Temp:  98.2 F (36.8 C)  TempSrc:  Oral  SpO2: 97%    Eyes: PERRL, lids and conjunctivae normal ENMT: Mucous membranes are dry. Posterior pharynx clear of any exudate or lesions..  Neck: normal, supple, no masses, no thyromegaly, and JVD present Respiratory: Tachypnea, decreased aeration, with crackles noted in both lung fields , and wheezes present. Cardiovascular: Regular rate and rhythm, no murmurs / rubs / gallops. 2+ lower extremitypitting  edema. 2+ pedal pulses. No carotid bruits.  Abdomen: no tenderness, no masses palpated. No  hepatosplenomegaly. Bowel sounds positive.  Musculoskeletal: no clubbing / cyanosis. No joint deformity upper and lower extremities. Good ROM, no contractures. Normal muscle tone.  Skin: no rashes, lesions, ulcers. No induration Neurologic: CN 2-12 grossly intact. Sensation intact, DTR normal. Strength 5/5 in all 4.  Psychiatric: Normal judgment and insight. Alert and oriented x 3. Normal mood.     Labs on Admission: I have personally reviewed following labs and imaging studies  CBC:  Recent Labs Lab 02/29/16 1817  WBC 8.8  NEUTROABS 7.2  HGB 12.0  HCT 36.6  MCV 93.8  PLT 161   Basic Metabolic Panel:  Recent Labs Lab 02/29/16 1817  NA 136  K 4.0  CL 104  CO2 25  GLUCOSE 136*  BUN 16  CREATININE 0.81  CALCIUM 8.6*   GFR: Estimated Creatinine Clearance: 48.9 mL/min (by C-G formula based on SCr of 0.81 mg/dL). Liver Function Tests: No results for input(s): AST, ALT, ALKPHOS, BILITOT, PROT, ALBUMIN in the last 168 hours. No results for input(s): LIPASE, AMYLASE in the last 168 hours. No results for input(s): AMMONIA in the last 168 hours. Coagulation Profile: No results for input(s): INR, PROTIME in the last 168 hours. Cardiac Enzymes: No results for input(s): CKTOTAL, CKMB, CKMBINDEX, TROPONINI in the last 168 hours. BNP (last 3 results) No results for input(s): PROBNP in the last 8760 hours. HbA1C: No results for input(s): HGBA1C in the last 72 hours. CBG:  Recent Labs Lab 02/25/16 1229  GLUCAP 116*   Lipid Profile: No results for input(s): CHOL, HDL, LDLCALC, TRIG, CHOLHDL, LDLDIRECT in the last 72 hours. Thyroid Function Tests: No results for input(s): TSH, T4TOTAL, FREET4, T3FREE, THYROIDAB in the last 72 hours. Anemia Panel: No results for input(s): VITAMINB12, FOLATE, FERRITIN, TIBC, IRON, RETICCTPCT in the last 72 hours. Urine analysis:    Component Value Date/Time   COLORURINE YELLOW 06/24/2015 [place] 06/24/2015 1308    LABSPEC 1.010 06/24/2015 1308   PHURINE 7.0 06/24/2015 1308   GLUCOSEU NEGATIVE 06/24/2015 1308   HGBUR NEGATIVE 06/24/2015 1308   [place] 06/24/2015 1308   KETONESUR NEGATIVE 06/24/2015 1308   PROTEINUR NEGATIVE 06/24/2015 1308   NITRITE NEGATIVE 06/24/2015 1308   LEUKOCYTESUR MODERATE (A) 06/24/2015 1308   Sepsis Labs: No results found for this  or any previous visit (from the past 240 hour(s)).   Radiological Exams on Admission: Dg Chest 2 View  Result Date: 02/29/2016 CLINICAL DATA:  Shortness of Breath for 2 months EXAM: CHEST  2 VIEW COMPARISON:  12/17/2015 FINDINGS: Cardiac shadow is stable. Lungs are well aerated bilaterally. Mild increased vascularity is again seen and stable. Increased density is noted in the left lung base posteriorly consistent with left lower lobe infiltrate. No acute bony abnormality is noted. IMPRESSION: Mild left lower lobe infiltrate. Electronically Signed   By: Inez Catalina M.D.   On: 02/29/2016 19:28    EKG: Independently reviewed. Sinus rhythm  Assessment/PlanNormal  Acute respiratory failure with hypoxia secondary to CHF exacerbation: Patient presents with complaints of shortness of breath progressively worsening complaints of lower extremity swelling. BNP elevated at 948.9. Last echo was in 07/2015 after left heart catheterization showed EF of 55-60%. CXR gave concern for possible LLL infiltrate on chest x-ray. Patient was given 20 mg of turosemide orally secondary to history of itching. - Admit to telemetry bed - Heart failure orderset initiated  - Continuous pulse oximetry O2 saturation greater than 90%. - Strict I&O's and daily weights - Trend troponins - check echocardiogram - Continue torsemide 27m po daily, adjust dose as needed - Continue metoprolol and benazepril - DuoNeb's every 4 hours as needed for shortness of breath or wheezing - Will need to Consult cardiology in a.m.   Left lower lobe infiltrate seen on chest x-ray:  Acute. Question possibility of underlying pneumonia. - Check pro-calcitonin - Empiric ceftriaxone and azithromycin, de-escalate when medically appropriate.  Paroxysmal atrial fibrillation status post cardioversion: Patient underwent cardioversion 4 days ago. Currently she is in normal sinus rhythm and reports still being on Eliquis which she takes regularly and has not missed a dose. Chadsvasc = 6. - Continue Eliquis  Essential hypertension: Stable - Continue amlodipine and all other blood pressure medications as listed above   Diabetes mellitus type 2: Diet controlled at this time not currently on any medications. Patient's last hemoglobin A1c was noted to be below 7 in 12/2015. - Continue to monitor  Hyperlipidemia - Continue atorvastatin   OSA on CPAP - RT to supply CPAP mask  Insomnia - Continue desipramine Ativan daily at bedtime  GERD  - Pharmacy substitution for omeprazole   DVT prophylaxis: Eliquis Code Status: Full  Family Communication: Discussed plan of care with the patient and daughter who is present in both Disposition Plan: Discharge home in  Consults called: none  Admission status: Observation  RNorval MortonMD [facility] Pager [phone] If 7PM-7AM, please contact night-coverage [email or web address] Password [email or web address]  02/29/2016, 8:20 PM

## 2016-02-29 NOTE — ED Notes (Signed)
Report attempted, RN to call in 10 min prior to bedside report.

## 2016-02-29 NOTE — ED Notes (Signed)
Pt dropped O2 on RA while resting on bed, Dr. Laneta Simmers notified.

## 2016-02-29 NOTE — ED Provider Notes (Signed)
[facility] DEPT Provider Note   CSN: [id number] Arrival date & time: 02/29/16  1736  By signing my name below, I, Dolores Hoose, attest that this documentation has been prepared under the direction and in the presence of Leo Grosser, MD . Electronically Signed: Dolores Hoose, Scribe. 02/29/2016. 6:09 PM.  History   Chief Complaint Chief Complaint  Patient presents with  . Shortness of Breath   The history is provided by the patient. No language interpreter was used.    HPI Comments:  Melinda [name] is a 82 y.o. female with pmhx of CAD and HTN who presents to the Emergency Department complaining of chronic intermittent worsened SOB which started months ago, but worsened today. She describes her symptoms as a tightness that prevents her from taking a deep inhalation. Pt states that she was walking around at church when her symptoms began. She reports associated lower extremity swelling, and cough. Pt denies any fevers or other symptoms. She is compliant with her daily Eliquis.   Past Medical History:  Diagnosis Date  . Acid reflux   . Anxiety   . Arthritis   . Cataract    surgery,bilateral  . Constipation   . Depression   . Diabetes mellitus without complication ([place])   . Dyspnea on exertion    a. 07/2015 Echo: EF 55-60%, no rwma, mild MR, mod dil LA/RA/RV, mod TR, triv PR, PASP 26mHg.  . Family history of adverse reaction to anesthesia    mother always had n/v  . Hx: UTI (urinary tract infection)   . Hypercholesterolemia   . Hypertensive heart disease   . Insomnia   . Multifactorial gait disorder   . Non-obstructive CAD    a. 07/2015 Cath: LM 30-40d, LAD min irregs, LCX min irregs, RCA 30p, RA 17, RV 33/7, PA 42/19, PCWP 17.  . Osteoarthritis    a. 06/2015 s/p R total hip arthroplasty.  .Marland KitchenPAF (paroxysmal atrial fibrillation) ([facility]    a. Dx 07/2015, CHA2DS2VASc = 6-->Eliquis.  . Premature atrial contractions   . Pulmonary hypertension    a. 07/2015 Echo: PASP  532mg.  . Marland Kitchenyloric antral stenosis    in childhood,infancy  . Sleep apnea with use of continuous positive airway pressure (CPAP) 10/19/2012   a. compliant w/ CPAP.    Patient Active Problem List   Diagnosis Date Noted  . Persistent atrial fibrillation ([place]  . Hypertensive heart disease   . Hypercholesterolemia   . Pulmonary hypertension   . Dyspnea on exertion   . Non-obstructive CAD   . Chest pain, moderate coronary artery risk 07/14/2015  . Pain in the chest   . PAF (paroxysmal atrial fibrillation) (HCC)   . Pulmonary arterial hypertension   . Chest pain with moderate risk for cardiac etiology 07/08/2015  . Dyspnea 07/08/2015  . Essential hypertension 07/08/2015  . Hyperlipidemia 07/08/2015  . Osteoarthritis of right hip 06/26/2015  . Hip joint replacement status 06/26/2015  . OSA on CPAP 03/27/2015  . Ataxia 03/27/2015  . Falls 03/27/2015  . Insomnia, controlled 03/27/2015  . Insomnia with sleep apnea 03/22/2014  . Adjustment disorder with mixed anxiety and depressed mood 03/22/2014  . Sleep apnea with use of continuous positive airway pressure (CPAP) 10/19/2012  . Multifactorial gait disorder     Past Surgical History:  Procedure Laterality Date  . ABDOMINAL HYSTERECTOMY  1970  . ABDOMINAL SURGERY     as a [age]ay old baby  . CARDIAC CATHETERIZATION N/A 07/14/2015   Procedure: Right/Left Heart  Cath and Coronary Angiography;  Surgeon: Sanda Klein, MD;  Location: [facility];  Service: Cardiovascular;  Laterality: N/A;  . CARDIOVERSION N/A 11/04/2015   Procedure: CARDIOVERSION;  Surgeon: Lelon Perla, MD;  Location[facility] ENDOSCOPY;  Service: Cardiovascular;  Laterality: N/A;  . CARDIOVERSION N/A 02/25/2016   Procedure: CARDIOVERSION;  Surgeon: Pixie Casino, MD;  Location: [facility] ENDOSCOPY;  Service: Cardiovascular;  Laterality: N/A;  . COLONOSCOPY    . EYE SURGERY Bilateral    cataract surgery with lens implant  . INNER EAR SURGERY Left    x 3  .  LAPAROSCOPIC SALPINGO OOPHERECTOMY    . NM MYOVIEW LTD  11/30/2007   normal stress nuclear study/EF-83%  . TONSILLECTOMY    . TOTAL HIP ARTHROPLASTY Right 06/26/2015   Procedure: RIGHT TOTAL HIP ARTHROPLASTY ANTERIOR APPROACH;  Surgeon: Leandrew Koyanagi, MD;  Location: [facility];  Service: Orthopedics;  Laterality: Right;  . TRANSTHORACIC ECHOCARDIOGRAM  07/23/2009   mild concentric LVH/mild mitral insufficiency/ moderate tricuspid innsufficiency/mild pulmonary HTN/EF- 55-60%  . VAGINAL DELIVERY      OB History    No data available       Home Medications    Prior to Admission medications   Medication Sig Start Date End Date Taking? Authorizing Provider  acetaminophen (TYLENOL) 650 MG CR tablet Take 650 mg by mouth every 8 (eight) hours as needed for pain.    Historical Provider, MD  amLODipine (NORVASC) 5 MG tablet Take 5 mg by mouth daily.    Historical Provider, MD  apixaban (ELIQUIS) 5 MG TABS tablet Take 1 tablet (5 mg total) by mouth 2 (two) times daily. 07/15/15   Robbie Lis, MD  atorvastatin (LIPITOR) 40 MG tablet Take 40 mg by mouth daily. One tablet in am    Historical Provider, MD  benzonatate (TESSALON) 100 MG capsule Take 1-2 capsules (100-200 mg total) by mouth every 8 (eight) hours. 12/17/15   Noland Fordyce, PA-C  Blood Glucose Monitoring Suppl (ONE TOUCH ULTRA 2) W/DEVICE KIT Reported on 07/11/2015 06/21/13   Historical Provider, MD  Calcium Carbonate-Vitamin D3 (CALCIUM 600+D3) 600-400 MG-UNIT TABS Take 1 tablet by mouth daily.    Historical Provider, MD  cholecalciferol (VITAMIN D) 1000 UNITS tablet Take 1,000 Units by mouth daily. D3    Historical Provider, MD  desipramine (NORPRAMIN) 25 MG tablet Take 25 mg by mouth daily.    Historical Provider, MD  glucosamine-chondroitin 500-400 MG tablet Take 1 tablet by mouth every other day.     Historical Provider, MD  glucose blood (ACCU-CHEK ACTIVE STRIPS) test strip 1 each by Other route as needed for other. Reported on 07/11/2015     Historical Provider, MD  linaclotide (LINZESS) 145 MCG CAPS capsule Take 145 mcg by mouth daily before breakfast.     Historical Provider, MD  LORazepam (ATIVAN) 0.5 MG tablet Take 1 tablet by mouth at bedtime. 12/26/15   Historical Provider, MD  metoprolol (LOPRESSOR) 50 MG tablet Take 1 tablet (50 mg total) by mouth 2 (two) times daily. As directed. 01/27/16   Rhonda G Barrett, PA-C  omeprazole (PRILOSEC) 20 MG capsule Take 20 mg by mouth daily. One tablet daily in mornings    Historical Provider, MD  [facility] DELICA LANCETS 49F MISC Reported on 07/11/2015 06/21/13   Historical Provider, MD  polyethylene glycol (MIRALAX / GLYCOLAX) packet Take 17 g by mouth 2 (two) times daily.    Historical Provider, MD  predniSONE (DELTASONE) 20 MG tablet 2 tabs po daily  x 3 days, 1 tab for 2 days 12/17/15   Noland Fordyce, PA-C  vitamin B-12 (CYANOCOBALAMIN) 500 MCG tablet Take 1,000 mcg by mouth daily.     Historical Provider, MD    Family History Family History  Problem Relation Age of Onset  . Heart attack Father   . Heart failure Mother   . Cancer Mother     colon,kidney  . Diabetes Sister   . Hypertension Sister   . Heart failure Sister     Social History Social History  Substance Use Topics  . Smoking status: Never Smoker  . Smokeless tobacco: Never Used  . Alcohol use No     Comment: rare     Allergies   Lasix [furosemide] and Phenobarbital   Review of Systems Review of Systems  Constitutional: Negative for fever.  Respiratory: Positive for cough and shortness of breath.   Cardiovascular: Positive for leg swelling.  All other systems reviewed and are negative.    Physical Exam Updated Vital Signs There were no vitals taken for this visit.  Physical Exam  Constitutional: She is oriented to person, place, and time. She appears well-developed and well-nourished. No distress.  HENT:  Head: Normocephalic.  Nose: Nose normal.  Eyes: Conjunctivae are normal.  Neck: Neck  supple. No tracheal deviation present.  Cardiovascular: Normal rate, regular rhythm, S1 normal and S2 normal.   Pulmonary/Chest: Effort normal. No respiratory distress. She has rales.  Bibasilar rales. Faint right sided end expiratory wheeze. 3+ bilateral pitting edema  Abdominal: Soft. She exhibits no distension.  Neurological: She is alert and oriented to person, place, and time.  Skin: Skin is warm and dry.  Psychiatric: She has a normal mood and affect.     ED Treatments / Results  COORDINATION OF CARE:  6:19 PM Discussed treatment plan with pt at bedside which includes lab work and pt agreed to plan.  Labs (all labs ordered are listed, but only abnormal results are displayed) Labs Reviewed  BASIC METABOLIC PANEL - Abnormal; Notable for the following:       Result Value   Glucose, Bld 136 (*)    Calcium 8.6 (*)    All other components within normal limits  BRAIN NATRIURETIC PEPTIDE - Abnormal; Notable for the following:    B Natriuretic Peptide 948.9 (*)    All other components within normal limits  BASIC METABOLIC PANEL - Abnormal; Notable for the following:    Potassium 3.3 (*)    Chloride 98 (*)    Glucose, Bld 151 (*)    Calcium 8.7 (*)    GFR calc non Af Amer 56 (*)    All other components within normal limits  BASIC METABOLIC PANEL - Abnormal; Notable for the following:    Chloride 99 (*)    Glucose, Bld 148 (*)    BUN 22 (*)    Creatinine, Ser 1.07 (*)    Calcium 8.6 (*)    GFR calc non Af Amer 47 (*)    GFR calc Af Amer 54 (*)    All other components within normal limits  CBC WITH DIFFERENTIAL/PLATELET - Abnormal; Notable for the following:    RBC 3.67 (*)    Hemoglobin 11.5 (*)    HCT 34.9 (*)    Platelets 147 (*)    All other components within normal limits  CBC WITH DIFFERENTIAL/PLATELET  PROCALCITONIN  TROPONIN I  TROPONIN I  TROPONIN I  PROCALCITONIN  MAGNESIUM  I-STAT TROPOININ, ED  EKG  EKG Interpretation  Date/Time:  'Sunday February 29 2016 17:41:12 EST Ventricular Rate:  68 PR Interval:    QRS Duration: 127 QT Interval:  408 QTC Calculation: 438 R Axis:   -74 Text Interpretation:  Sinus rhythm Nonspecific IVCD with LAD Anteroseptal infarct, old Borderline T abnormalities, inferior leads Baseline wander in lead(s) V3 No significant change since last tracing Confirmed by Dvonte Gatliff MD, Amerika Nourse ([id number]) on 02/29/2016 5:50:31 PM       Radiology No results found.  Procedures Procedures (including critical care time)  Medications Ordered in ED Medications  ipratropium-albuterol (DUONEB) 0.5-2.5 (3) MG/3ML nebulizer solution 3 mL (3 mLs Nebulization Given 03/01/16 0031)  potassium chloride SA (K-DUR,KLOR-CON) CR tablet 40 mEq (40 mEq Oral Given 03/01/16 0600)  potassium chloride SA (K-DUR,KLOR-CON) CR tablet 40 mEq (40 mEq Oral Given 03/01/16 1206)  gi cocktail (Maalox,Lidocaine,Donnatal) (30 mLs Oral Given 03/02/16 1436)     Initial Impression / Assessment and Plan / ED Course  I have reviewed the triage vital signs and the nursing notes.  Pertinent labs & imaging results that were available during my care of the patient were reviewed by me and considered in my medical decision making (see chart for details).  Clinical Course     84'  y.o. female presents with hypoxia and shortness of breath with bilateral leg swelling. Suspect element of heart failure with recent cardioversion. Noted infiltrate on CXR but no leukocytosis or productive cough to clinically indicate CAP. Hospitalist was consulted for admission and will see the patient in the emergency department.   Final Clinical Impressions(s) / ED Diagnoses   Final diagnoses:  Acute on chronic diastolic congestive heart failure (HCC)  Hypoxia    New Prescriptions New Prescriptions   No medications on file  I personally performed the services described in this documentation, which was scribed in my presence. The recorded information has been reviewed and is  accurate.       Leo Grosser, MD 03/03/16 [phone]

## 2016-03-01 DIAGNOSIS — R0902 Hypoxemia: Secondary | ICD-10-CM | POA: Diagnosis present

## 2016-03-01 DIAGNOSIS — Z961 Presence of intraocular lens: Secondary | ICD-10-CM | POA: Diagnosis present

## 2016-03-01 DIAGNOSIS — I509 Heart failure, unspecified: Secondary | ICD-10-CM | POA: Diagnosis not present

## 2016-03-01 DIAGNOSIS — Z90722 Acquired absence of ovaries, bilateral: Secondary | ICD-10-CM | POA: Diagnosis not present

## 2016-03-01 DIAGNOSIS — E785 Hyperlipidemia, unspecified: Secondary | ICD-10-CM | POA: Diagnosis present

## 2016-03-01 DIAGNOSIS — K219 Gastro-esophageal reflux disease without esophagitis: Secondary | ICD-10-CM | POA: Diagnosis present

## 2016-03-01 DIAGNOSIS — I110 Hypertensive heart disease with heart failure: Secondary | ICD-10-CM | POA: Diagnosis present

## 2016-03-01 DIAGNOSIS — I272 Pulmonary hypertension, unspecified: Secondary | ICD-10-CM | POA: Diagnosis present

## 2016-03-01 DIAGNOSIS — Z7901 Long term (current) use of anticoagulants: Secondary | ICD-10-CM | POA: Diagnosis not present

## 2016-03-01 DIAGNOSIS — G4700 Insomnia, unspecified: Secondary | ICD-10-CM | POA: Diagnosis present

## 2016-03-01 DIAGNOSIS — Z90710 Acquired absence of both cervix and uterus: Secondary | ICD-10-CM | POA: Diagnosis not present

## 2016-03-01 DIAGNOSIS — Z79899 Other long term (current) drug therapy: Secondary | ICD-10-CM | POA: Diagnosis not present

## 2016-03-01 DIAGNOSIS — Z8249 Family history of ischemic heart disease and other diseases of the circulatory system: Secondary | ICD-10-CM | POA: Diagnosis not present

## 2016-03-01 DIAGNOSIS — J189 Pneumonia, unspecified organism: Secondary | ICD-10-CM | POA: Diagnosis present

## 2016-03-01 DIAGNOSIS — E119 Type 2 diabetes mellitus without complications: Secondary | ICD-10-CM | POA: Diagnosis present

## 2016-03-01 DIAGNOSIS — F329 Major depressive disorder, single episode, unspecified: Secondary | ICD-10-CM | POA: Diagnosis present

## 2016-03-01 DIAGNOSIS — I5033 Acute on chronic diastolic (congestive) heart failure: Secondary | ICD-10-CM | POA: Diagnosis present

## 2016-03-01 DIAGNOSIS — I4891 Unspecified atrial fibrillation: Secondary | ICD-10-CM | POA: Diagnosis not present

## 2016-03-01 DIAGNOSIS — I2510 Atherosclerotic heart disease of native coronary artery without angina pectoris: Secondary | ICD-10-CM | POA: Diagnosis present

## 2016-03-01 DIAGNOSIS — J9601 Acute respiratory failure with hypoxia: Secondary | ICD-10-CM | POA: Diagnosis present

## 2016-03-01 DIAGNOSIS — E7800 Pure hypercholesterolemia, unspecified: Secondary | ICD-10-CM | POA: Diagnosis present

## 2016-03-01 DIAGNOSIS — Z9079 Acquired absence of other genital organ(s): Secondary | ICD-10-CM | POA: Diagnosis not present

## 2016-03-01 DIAGNOSIS — Z96641 Presence of right artificial hip joint: Secondary | ICD-10-CM | POA: Diagnosis present

## 2016-03-01 DIAGNOSIS — F419 Anxiety disorder, unspecified: Secondary | ICD-10-CM | POA: Diagnosis present

## 2016-03-01 DIAGNOSIS — Z833 Family history of diabetes mellitus: Secondary | ICD-10-CM | POA: Diagnosis not present

## 2016-03-01 DIAGNOSIS — I480 Paroxysmal atrial fibrillation: Secondary | ICD-10-CM | POA: Diagnosis present

## 2016-03-01 DIAGNOSIS — G4733 Obstructive sleep apnea (adult) (pediatric): Secondary | ICD-10-CM | POA: Diagnosis present

## 2016-03-01 LAB — BASIC METABOLIC PANEL
Anion gap: 10 (ref 5–15)
BUN: 15 mg/dL (ref 6–20)
CALCIUM: 8.7 mg/dL — AB (ref 8.9–10.3)
CO2: 28 mmol/L (ref 22–32)
CREATININE: 0.92 mg/dL (ref 0.44–1.00)
Chloride: 98 mmol/L — ABNORMAL LOW (ref 101–111)
GFR calc non Af Amer: 56 mL/min — ABNORMAL LOW (ref >60)
Glucose, Bld: 151 mg/dL — ABNORMAL HIGH (ref 65–99)
Potassium: 3.3 mmol/L — ABNORMAL LOW (ref 3.5–5.1)
SODIUM: 136 mmol/L (ref 135–145)

## 2016-03-01 LAB — TROPONIN I: Troponin I: <0.03 ng/mL (ref <0.03)

## 2016-03-01 MED ORDER — POTASSIUM CHLORIDE CRYS ER 20 MEQ PO TBCR
40.0000 meq | EXTENDED_RELEASE_TABLET | Freq: Once | ORAL | Status: AC
  Administered 2016-03-01: 40 meq via ORAL
  Filled 2016-03-01: qty 2

## 2016-03-01 NOTE — Progress Notes (Signed)
Patient placed on CPAP without complication. RT will monitor as needed. 

## 2016-03-01 NOTE — Progress Notes (Signed)
[facility] Progress Note  Patient: Melinda [name] [id number]   PCP: Jerlyn Ly, MD DOB: [birth year]   DOA: 02/29/2016   DOS: 03/01/2016   Date of Service: the patient was seen and examined on 03/01/2016  Brief hospital course: Pt. with PMH of HTN, HLD, CAD, DM type II, PAF on Eliquis; admitted on 02/29/2016, with complaint of Shortness of breath, was found to have acute hypoxic respiratory failure likely due to acute on chronic diastolic dysfunction. Currently further plan is continue current plan.  Assessment and Plan: Acute respiratory failure with hypoxia secondary to Acute on chronic diastolic dysfunction Patient presents with complaints of shortness of breath progressively worsening complaints of lower extremity swelling. BNP elevated at 948.9. Last echo was in 07/2015 after left heart catheterization showed EF of 55-60%. CXR gave concern for possible LLL infiltrate on chest x-ray.  - Continuous pulse oximetry O2 saturation greater than 90%. - Strict I&O's and daily weights -Negative troponins - check echocardiogram - Continue torsemide 20mg  po daily, adjust dose as needed - Continue metoprolol and benazepril - DuoNeb's every 4 hours as needed for shortness of breath or wheezing   Left lower lobe infiltrate seen on chest x-ray: Acute. Question possibility of underlying pneumonia. - Empiric ceftriaxone and azithromycin. Continue monitoring  Paroxysmal atrial fibrillation status post cardioversion: Patient underwent cardioversion 4 days ago. Currently she is in normal sinus rhythm and reports still being on Eliquis which she takes regularly and has not missed a dose. Chadsvasc = 6. - Continue Eliquis  Essential hypertension: Stable - Continue amlodipine and all other blood pressure medications as listed above   Diabetes mellitus type 2: Diet controlled at this time not currently on any medications. Patient's last hemoglobin A1c was noted to be below 7 in  12/2015. - Continue to monitor  Hyperlipidemia - Continue atorvastatin   OSA on CPAP - RT to supply CPAP mask  Insomnia - Continue desipramine Ativan daily at bedtime  Pain management: When necessary Tylenol Activity: Currently independent so no physical therapy Bowel regimen: last BM prior to admission Diet: Cardiac diet DVT Prophylaxis: on therapeutic anticoagulation.  Advance goals of care discussion: Full code  Family Communication: no family was present at bedside, at the time of interview.  Disposition:  Discharge to home. Expected discharge date: 03/04/2016  Consultants: noen Procedures: Echocardiogram pending  Antibiotics: Anti-infectives    Start     Dose/Rate Route Frequency Ordered Stop   02/29/16 2359  cefTRIAXone (ROCEPHIN) 1 g in dextrose 5 % 50 mL IVPB     1 g 100 mL/hr over 30 Minutes Intravenous Daily at bedtime 02/29/16 2212     02/29/16 2359  azithromycin (ZITHROMAX) 500 mg in dextrose 5 % 250 mL IVPB     500 mg 250 mL/hr over 60 Minutes Intravenous Daily at bedtime 02/29/16 2213          Subjective: Feeling better, no chest pain at present shortness of breath  Objective: Physical Exam: Vitals:   03/01/16 0049 03/01/16 0500 03/01/16 0900 03/01/16 1300  BP: 129/61 138/62 (!) 110/49 (!) 109/43  Pulse: 68 72 68 66  Resp: 20 18 18 18   Temp: 98.7 F (37.1 C) 98.5 F (36.9 C) 99.3 F (37.4 C) 98 F (36.7 C)  TempSrc: Oral Oral Oral Oral  SpO2: 99% 96% 93% 100%  Weight:  75.5 kg (166 lb 6.4 oz)    Height:        Intake/Output Summary (Last 24 hours) at 03/01/16 1732 Last data filed  at 03/01/16 1700  Gross per 24 hour  Intake              780 ml  Output             3652 ml  Net            -2872 ml   Filed Weights   02/29/16 2215 03/01/16 0500  Weight: 77.8 kg (171 lb 9.6 oz) 75.5 kg (166 lb 6.4 oz)    General: Alert, Awake and Oriented to Time, Place and Person. Appear in mild distress, affect appropriate Eyes: PERRL, Conjunctiva  normal ENT: Oral Mucosa clear moist. Neck: no JVD, no Abnormal Mass Or lumps Cardiovascular: S1 and S2 Present, no Murmur, Respiratory: Bilateral Air entry equal and Decreased, no use of accessory muscle, Clear to Auscultation, no Crackles, no wheezes Abdomen: Bowel Sound present, Soft and no tenderness Skin: no redness, no Rash, no induration Extremities: no Pedal edema, no calf tenderness Neurologic: Grossly no focal neuro deficit. Bilaterally Equal motor strength  Data Reviewed: CBC:  Recent Labs Lab 02/29/16 1817  WBC 8.8  NEUTROABS 7.2  HGB 12.0  HCT 36.6  MCV 93.8  PLT [id number]   Basic Metabolic Panel:  Recent Labs Lab 02/29/16 1817 03/01/16 0259  NA 136 136  K 4.0 3.3*  CL 104 98*  CO2 25 28  GLUCOSE 136* 151*  BUN 16 15  CREATININE 0.81 0.92  CALCIUM 8.6* 8.7*    Liver Function Tests: No results for input(s): AST, ALT, ALKPHOS, BILITOT, PROT, ALBUMIN in the last 168 hours. No results for input(s): LIPASE, AMYLASE in the last 168 hours. No results for input(s): AMMONIA in the last 168 hours. Coagulation Profile: No results for input(s): INR, PROTIME in the last 168 hours. Cardiac Enzymes:  Recent Labs Lab 02/29/16 2245 03/01/16 0259 03/01/16 1018  TROPONINI <0.03 <0.03 <0.03   BNP (last 3 results) No results for input(s): PROBNP in the last 8760 hours.  CBG:  Recent Labs Lab 02/25/16 1229  GLUCAP 116*    Studies: Dg Chest 2 View  Result Date: 02/29/2016 CLINICAL DATA:  Shortness of Breath for 2 months EXAM: CHEST  2 VIEW COMPARISON:  12/17/2015 FINDINGS: Cardiac shadow is stable. Lungs are well aerated bilaterally. Mild increased vascularity is again seen and stable. Increased density is noted in the left lung base posteriorly consistent with left lower lobe infiltrate. No acute bony abnormality is noted. IMPRESSION: Mild left lower lobe infiltrate. Electronically Signed   By: Inez Catalina M.D.   On: 02/29/2016 19:28     Scheduled Meds: .  amLODipine  5 mg Oral Daily  . apixaban  5 mg Oral BID  . atorvastatin  40 mg Oral Daily  . azithromycin  500 mg Intravenous QHS  . benazepril  20 mg Oral Daily  . cefTRIAXone (ROCEPHIN)  IV  1 g Intravenous QHS  . desipramine  25 mg Oral Daily  . guaiFENesin  600 mg Oral BID  . linaclotide  145 mcg Oral QAC breakfast  . metoprolol  25 mg Oral BID  . pantoprazole  40 mg Oral Daily  . sodium chloride flush  3 mL Intravenous Q12H  . torsemide  20 mg Oral Daily  . vitamin B-12  1,000 mcg Oral Daily   Continuous Infusions: PRN Meds: sodium chloride, acetaminophen, diphenhydrAMINE, hydroxypropyl methylcellulose / hypromellose, ipratropium-albuterol, LORazepam, ondansetron (ZOFRAN) IV, polyethylene glycol, sodium chloride flush  Time spent: 30 minutes  Author: Berle Mull, MD [facility] Pager: [phone] 03/01/2016  5:32 PM  If 7PM-7AM, please contact night-coverage at [email or web address], password [email or web address]

## 2016-03-02 ENCOUNTER — Inpatient Hospital Stay (HOSPITAL_COMMUNITY): Payer: Medicare HMO

## 2016-03-02 DIAGNOSIS — I4891 Unspecified atrial fibrillation: Secondary | ICD-10-CM

## 2016-03-02 DIAGNOSIS — I5033 Acute on chronic diastolic (congestive) heart failure: Secondary | ICD-10-CM

## 2016-03-02 DIAGNOSIS — I2510 Atherosclerotic heart disease of native coronary artery without angina pectoris: Secondary | ICD-10-CM

## 2016-03-02 DIAGNOSIS — I509 Heart failure, unspecified: Secondary | ICD-10-CM

## 2016-03-02 LAB — CBC WITH DIFFERENTIAL/PLATELET
BASOS ABS: 0 10*3/uL (ref 0.0–0.1)
Basophils Relative: 0 %
EOS ABS: 0.1 10*3/uL (ref 0.0–0.7)
Eosinophils Relative: 1 %
HCT: 34.9 % — ABNORMAL LOW (ref 36.0–46.0)
Hemoglobin: 11.5 g/dL — ABNORMAL LOW (ref 12.0–15.0)
LYMPHS ABS: 1 10*3/uL (ref 0.7–4.0)
LYMPHS PCT: 16 %
MCH: 31.3 pg (ref 26.0–34.0)
MCHC: 33 g/dL (ref 30.0–36.0)
MCV: 95.1 fL (ref 78.0–100.0)
Monocytes Absolute: 0.5 10*3/uL (ref 0.1–1.0)
Monocytes Relative: 8 %
NEUTROS PCT: 75 %
Neutro Abs: 4.8 10*3/uL (ref 1.7–7.7)
Platelets: 147 10*3/uL — ABNORMAL LOW (ref 150–400)
RBC: 3.67 MIL/uL — AB (ref 3.87–5.11)
RDW: 13.5 % (ref 11.5–15.5)
WBC: 6.4 10*3/uL (ref 4.0–10.5)

## 2016-03-02 LAB — BASIC METABOLIC PANEL
Anion gap: 10 (ref 5–15)
BUN: 22 mg/dL — ABNORMAL HIGH (ref 6–20)
CHLORIDE: 99 mmol/L — AB (ref 101–111)
CO2: 27 mmol/L (ref 22–32)
CREATININE: 1.07 mg/dL — AB (ref 0.44–1.00)
Calcium: 8.6 mg/dL — ABNORMAL LOW (ref 8.9–10.3)
GFR calc non Af Amer: 47 mL/min — ABNORMAL LOW (ref >60)
GFR, EST AFRICAN AMERICAN: 54 mL/min — AB (ref >60)
Glucose, Bld: 148 mg/dL — ABNORMAL HIGH (ref 65–99)
POTASSIUM: 3.8 mmol/L (ref 3.5–5.1)
SODIUM: 136 mmol/L (ref 135–145)

## 2016-03-02 LAB — MAGNESIUM: MAGNESIUM: 2 mg/dL (ref 1.7–2.4)

## 2016-03-02 LAB — ECHOCARDIOGRAM COMPLETE
Height: 60 in
Weight: 2633.6 oz

## 2016-03-02 LAB — PROCALCITONIN: Procalcitonin: 0.1 ng/mL

## 2016-03-02 MED ORDER — TORSEMIDE 10 MG PO TABS: 10.0000 mg | ORAL_TABLET | Freq: Every day | ORAL | 0 refills | Status: DC

## 2016-03-02 MED ORDER — AZITHROMYCIN 500 MG PO TABS: 500.0000 mg | ORAL_TABLET | Freq: Every day | ORAL | 0 refills | Status: AC

## 2016-03-02 MED ORDER — DIPHENHYDRAMINE HCL 25 MG PO CAPS: 25.0000 mg | ORAL_CAPSULE | Freq: Four times a day (QID) | ORAL | Status: DC | PRN

## 2016-03-02 MED ORDER — GUAIFENESIN ER 600 MG PO TB12
600.0000 mg | ORAL_TABLET | Freq: Two times a day (BID) | ORAL | 0 refills | Status: DC
Start: 2016-03-02 — End: 2016-03-17

## 2016-03-02 MED ORDER — CEPHALEXIN 500 MG PO CAPS: 500.0000 mg | ORAL_CAPSULE | Freq: Two times a day (BID) | ORAL | 0 refills | Status: AC

## 2016-03-02 MED ORDER — ALUM & MAG HYDROXIDE-SIMETH 200-200-20 MG/5ML PO SUSP
15.0000 mL | ORAL | Status: DC | PRN
  Administered 2016-03-02: 15 mL via ORAL
  Filled 2016-03-02: qty 30

## 2016-03-02 MED ORDER — DIPHENHYDRAMINE HCL 25 MG PO CAPS: 25.0000 mg | ORAL_CAPSULE | Freq: Four times a day (QID) | ORAL | 0 refills | Status: DC | PRN

## 2016-03-02 MED ORDER — BENZONATATE 100 MG PO CAPS: 100.0000 mg | ORAL_CAPSULE | Freq: Three times a day (TID) | ORAL | 0 refills | Status: DC | PRN

## 2016-03-02 MED ORDER — METOPROLOL SUCCINATE ER 25 MG PO TB24: 25.0000 mg | ORAL_TABLET | Freq: Every day | ORAL | 0 refills | Status: DC

## 2016-03-02 MED ORDER — MENTHOL 3 MG MT LOZG: 1.0000 | LOZENGE | OROMUCOSAL | 0 refills | Status: DC | PRN

## 2016-03-02 MED ORDER — MENTHOL 3 MG MT LOZG
1.0000 | LOZENGE | OROMUCOSAL | Status: DC | PRN
  Filled 2016-03-02: qty 9

## 2016-03-02 MED ORDER — GI COCKTAIL ~~LOC~~
30.0000 mL | Freq: Once | ORAL | Status: AC
  Administered 2016-03-02: 30 mL via ORAL
  Filled 2016-03-02: qty 30

## 2016-03-02 MED ORDER — ALUM & MAG HYDROXIDE-SIMETH 200-200-20 MG/5ML PO SUSP: 15.0000 mL | ORAL | 0 refills | Status: DC | PRN

## 2016-03-02 MED ORDER — METOPROLOL SUCCINATE ER 25 MG PO TB24
25.0000 mg | ORAL_TABLET | Freq: Every day | ORAL | Status: DC
  Administered 2016-03-02: 25 mg via ORAL
  Filled 2016-03-02: qty 1

## 2016-03-02 NOTE — Discharge Summary (Signed)
[facility] Discharge Summary   Patient: Melinda [name] IHW:[id number]   PCP: Jerlyn Ly, MD DOB: 18-Jul-1931   Date of admission: 02/29/2016   Date of discharge: 03/02/2016    Discharge Diagnoses:  Principal Problem:   CHF exacerbation ([place]) Active Problems:   OSA on CPAP   Essential hypertension   Hyperlipidemia   PAF (paroxysmal atrial fibrillation) ([facility])   Acute respiratory failure with hypoxia ([place])   Admitted From: home Disposition:  home  Recommendations for Outpatient Follow-up:  1. Follow-up with PCP in one week. 2. Home with oxygen 2 L   Follow-up Information    Jerlyn Ly, MD.   Specialty:  Internal Medicine Why:  I called and left  (DJ), the NP a voice mail to call us back Contact information: [address]  [place] [address] [phone]          Diet recommendation: Cardiac diet  Activity: The patient is advised to gradually reintroduce usual activities.  Discharge Condition: good  Code Status: Full code  History of present illness: As per the H and P dictated on admission, "Melinda [name] is a 82 y.o. female with medical history significant of  HTN, HLD, CAD, DM type II, PAF on Eliquis; who presents with a reported several month history of intermittent shortness of breath that acutely worsened over the last few days. She previously noticed shortness of breath symptoms when significantly exerting herself such as walking upstairs or walking long distances. However, over the last few days  she could not walk even 2-3 steps without having to pause catch her breath.  patient denies trying anything to is a symptoms. States that she's only on oxygen with her CPAP mask at night. Associated symptoms included fatigue, 2 pillow orthopnea, lower extremity swelling, wheezing, dry cough, and chills. Patient just underwent cardioversion for atrial fibrillation 4 days ago. This was her second time having this procedure done. She reports taking  her Eliquis regularly. She is followed by Dr. Peter Martinique of cardiology in the outpatient setting. She's had no other recent changes to her medication regimen. Denies having any chest pain, fever, dysuria, urgency, abdominal pain, nausea, vomiting, palpitations, or loss of consciousness."  Hospital Course:   Summary of her active problems in the hospital is as following. Acute respiratory failure with hypoxia secondary to community acquired pneumonia as well as acute on chronic diastolic dysfunction.  Patient presents with complaints of shortness of breath progressively worsening complaints of lower extremity swelling. BNP elevated at 948.9. Last echo was in 07/2015 after left heart catheterization showed EF of 55-60%. CXR gave concern for possible LLLinfiltrate on chest x-ray.  -Negative troponins - echocardiogram with preserved EF and without any wall motion abnormality - She was started on torsemide 44m po daily, changed to 10 mg daily on discharge - Continue metoprolol  Left lower lobe infiltrate seen on chest x-ray: Acute. Question possibility of underlying pneumonia. Cultures are negative. We'll finish 7 day treatment course. Her Keflex and azithromycin  Paroxysmal atrial fibrillation status post cardioversion:  Patient underwent cardioversion 4 days ago. Currently she is in normal sinus rhythm and reports still being on Eliquis which she takes regularly and has not missed a dose. Chadsvasc = 6. - Continue Eliquis  Essential hypertension: Stable - Continue amlodipine and all other blood pressure medications as listed above   Diabetes mellitus type 2:Diet controlled at this time not currently on any medications. Patient's last hemoglobin A1c was noted to be below 7 in 12/2015.  Hyperlipidemia -  Continue atorvastatin   OSA on CPAP Continue home CPAP  Insomnia - Continue desipramine Ativan daily at bedtime  All other chronic medical condition were stable during the  hospitalization.  Patient was ambulatory without any assistance. On the day of the discharge the patient's vitals were stable, and no other acute medical condition were reported by patient. the patient was felt safe to be discharge at home with family.  Procedures and Results:  Echocardiogram   Consultations:  none  DISCHARGE MEDICATION: Discharge Medication List as of 03/02/2016  4:03 PM    START taking these medications   Details  azithromycin (ZITHROMAX) 500 MG tablet Take 1 tablet (500 mg total) by mouth daily., Starting Tue 03/02/2016, Until Fri 03/05/2016, Normal    cephALEXin (KEFLEX) 500 MG capsule Take 1 capsule (500 mg total) by mouth 2 (two) times daily., Starting Tue 03/02/2016, Until Sun 03/07/2016, Normal    diphenhydrAMINE (BENADRYL) 25 mg capsule Take 1 capsule (25 mg total) by mouth every 6 (six) hours as needed for itching or allergies (sore throat)., Starting Tue 03/02/2016, Normal    guaiFENesin (MUCINEX) 600 MG 12 hr tablet Take 1 tablet (600 mg total) by mouth 2 (two) times daily., Starting Tue 03/02/2016, Normal    menthol-cetylpyridinium (CEPACOL) 3 MG lozenge Take 1 lozenge (3 mg total) by mouth as needed for sore throat., Starting Tue 03/02/2016, Normal    metoprolol succinate (TOPROL-XL) 25 MG 24 hr tablet Take 1 tablet (25 mg total) by mouth daily., Starting Wed 03/03/2016, Normal    torsemide (DEMADEX) 10 MG tablet Take 1 tablet (10 mg total) by mouth daily., Starting Wed 03/03/2016, Normal      CONTINUE these medications which have CHANGED   Details  benzonatate (TESSALON) 100 MG capsule Take 1 capsule (100 mg total) by mouth 3 (three) times daily as needed for cough., Starting Tue 03/02/2016, Normal      CONTINUE these medications which have NOT CHANGED   Details  acetaminophen (TYLENOL) 650 MG CR tablet Take 650 mg by mouth 2 (two) times daily as needed (arthritis pain). , Historical Med    amLODipine (NORVASC) 5 MG tablet Take 5 mg by mouth  daily., Until Discontinued, Historical Med    apixaban (ELIQUIS) 5 MG TABS tablet Take 1 tablet (5 mg total) by mouth 2 (two) times daily., Starting Tue 07/15/2015, Print    atorvastatin (LIPITOR) 40 MG tablet Take 40 mg by mouth daily. , Historical Med    Blood Glucose Monitoring Suppl (ONE TOUCH ULTRA 2) W/DEVICE KIT Reported on 07/11/2015, Starting 06/21/2013, Until Discontinued, Historical Med    Calcium Carbonate-Vitamin D3 (CALCIUM 600+D3) 600-400 MG-UNIT TABS Take 1 tablet by mouth daily., Until Discontinued, Historical Med    cholecalciferol (VITAMIN D) 1000 UNITS tablet Take 1,000 Units by mouth daily. , Historical Med    desipramine (NORPRAMIN) 25 MG tablet Take 25 mg by mouth daily., Until Discontinued, Historical Med    Glucosamine HCl (GLUCOSAMINE PO) Take 1 tablet by mouth daily., Historical Med    glucosamine-chondroitin 500-400 MG tablet Take 1 tablet by mouth every other day. , Until Discontinued, Historical Med    glucose blood (ACCU-CHEK ACTIVE STRIPS) test strip 1 each by Other route as needed for other. Reported on 07/11/2015, Historical Med    linaclotide (LINZESS) 145 MCG CAPS capsule Take 145 mcg by mouth daily before breakfast. , Until Discontinued, Historical Med    LORazepam (ATIVAN) 0.5 MG tablet Take 0.5 mg by mouth at bedtime. , Starting Fri 12/26/2015, Historical  Med    omeprazole (PRILOSEC) 20 MG capsule Take 20 mg by mouth daily. , Historical Med    ONETOUCH DELICA LANCETS 02V MISC Reported on 07/11/2015, Starting 06/21/2013, Until Discontinued, Historical Med    Polyethyl Glycol-Propyl Glycol (SYSTANE) 0.4-0.3 % GEL ophthalmic gel Place 1 application into both eyes at bedtime as needed (dry eyes)., Historical Med    polyethylene glycol (MIRALAX / GLYCOLAX) packet Take 17 g by mouth daily. Mix in 8 oz liquid and drink, Historical Med    PRESCRIPTION MEDICATION Inhale into the lungs at bedtime. Uses CPAP with oxygen, Historical Med    vitamin B-12  (CYANOCOBALAMIN) 1000 MCG tablet Take 1,000 mcg by mouth daily., Historical Med      STOP taking these medications     benazepril (LOTENSIN) 20 MG tablet      metoprolol (LOPRESSOR) 50 MG tablet        Allergies  Allergen Reactions  . Lasix [Furosemide] Itching  . Phenobarbital Itching  . Amitiza [Lubiprostone] Itching   Discharge Instructions    Diet - low sodium heart healthy    Complete by:  As directed    Discharge instructions    Complete by:  As directed    It is important that you read following instructions as well as go over your medication list with RN to help you understand your care after this hospitalization.  Discharge Instructions: Please follow-up with PCP in one week  Please request your primary care physician to go over all Hospital Tests and Procedure/Radiological results at the follow up,  Please get all Hospital records sent to your PCP by signing hospital release before you go home.   Do not take more than prescribed Pain, Sleep and Anxiety Medications. You were cared for by a hospitalist during your hospital stay. If you have any questions about your discharge medications or the care you received while you were in the hospital after you are discharged, you can call the unit and ask to speak with the hospitalist on call if the hospitalist that took care of you is not available.  Once you are discharged, your primary care physician will handle any further medical issues. Please note that NO REFILLS for any discharge medications will be authorized once you are discharged, as it is imperative that you return to your primary care physician (or establish a relationship with a primary care physician if you do not have one) for your aftercare needs so that they can reassess your need for medications and monitor your lab values. You Must read complete instructions/literature along with all the possible adverse reactions/side effects for all the Medicines you take and  that have been prescribed to you. Take any new Medicines after you have completely understood and accept all the possible adverse reactions/side effects. Wear Seat belts while driving. If you have smoked or chewed Tobacco in the last 2 yrs please stop smoking and/or stop any Recreational drug use.   Increase activity slowly    Complete by:  As directed      Discharge Exam: Filed Weights   02/29/16 2215 03/01/16 0500 03/02/16 0614  Weight: 77.8 kg (171 lb 9.6 oz) 75.5 kg (166 lb 6.4 oz) 74.7 kg (164 lb 9.6 oz)   Vitals:   03/02/16 0611 03/02/16 1104  BP: (!) 114/54 140/65  Pulse: 71 72  Resp: 17 18  Temp: 98.3 F (36.8 C) 97.6 F (36.4 C)   General: Appear in no distress, no Rash; Oral Mucosa moist. Cardiovascular: S1  and S2 Present, no Murmur, no JVD Respiratory: Bilateral Air entry present and Clear to Auscultation, no Crackles, no wheezes Abdomen: Bowel Sound present, Soft and no tenderness Extremities: no Pedal edema, no calf tenderness Neurology: Grossly no focal neuro deficit.  The results of significant diagnostics from this hospitalization (including imaging, microbiology, ancillary and laboratory) are listed below for reference.    Significant Diagnostic Studies: Dg Chest 2 View  Result Date: 02/29/2016 CLINICAL DATA:  Shortness of Breath for 2 months EXAM: CHEST  2 VIEW COMPARISON:  12/17/2015 FINDINGS: Cardiac shadow is stable. Lungs are well aerated bilaterally. Mild increased vascularity is again seen and stable. Increased density is noted in the left lung base posteriorly consistent with left lower lobe infiltrate. No acute bony abnormality is noted. IMPRESSION: Mild left lower lobe infiltrate. Electronically Signed   By: Inez Catalina M.D.   On: 02/29/2016 19:28   Mm Screening Breast Tomo Bilateral  Result Date: 02/24/2016 CLINICAL DATA:  Screening. EXAM: 2D DIGITAL SCREENING BILATERAL MAMMOGRAM WITH CAD AND ADJUNCT TOMO COMPARISON:  Previous exam(s). ACR Breast  Density Category b: There are scattered areas of fibroglandular density. FINDINGS: In the left breast, a possible mass warrants further evaluation. In the right breast, no findings suspicious for malignancy. Images were processed with CAD. IMPRESSION: Further evaluation is suggested for possible mass in the left breast. RECOMMENDATION: Diagnostic mammogram and possibly ultrasound of the left breast. (Code:[id number]) The patient will be contacted regarding the findings, and additional imaging will be scheduled. BI-RADS CATEGORY  0: Incomplete. Need additional imaging evaluation and/or prior mammograms for comparison. Electronically Signed   By: Nolon Nations M.D.   On: 02/24/2016 16:40    Microbiology: No results found for this or any previous visit (from the past 240 hour(s)).   Labs: CBC:  Recent Labs Lab 02/29/16 1817 03/02/16 0512  WBC 8.8 6.4  NEUTROABS 7.2 4.8  HGB 12.0 11.5*  HCT 36.6 34.9*  MCV 93.8 95.1  PLT 171 616*   Basic Metabolic Panel:  Recent Labs Lab 02/29/16 1817 03/01/16 0259 03/02/16 0512  NA 136 136 136  K 4.0 3.3* 3.8  CL 104 98* 99*  CO2 '25 28 27  ' GLUCOSE 136* 151* 148*  BUN 16 15 22*  CREATININE 0.81 0.92 1.07*  CALCIUM 8.6* 8.7* 8.6*  MG  --   --  2.0   Cardiac Enzymes:  Recent Labs Lab 02/29/16 2245 03/01/16 0259 03/01/16 1018  TROPONINI <0.03 <0.03 <0.03   BNP (last 3 results)  Recent Labs  07/14/15 0019 02/29/16 1817  BNP 640.5* 948.9*   CBG:  Recent Labs Lab 02/25/16 1229  GLUCAP 116*   Time spent: 30 minutes  Signed:  Phyllicia Dudek  [facility] 03/02/2016  , 6:39 PM

## 2016-03-02 NOTE — Progress Notes (Signed)
SATURATION QUALIFICATIONS: (This note is used to comply with regulatory documentation for home oxygen)  Patient Saturations on Room Air at Rest = 97%  Patient Saturations on Room Air while Ambulating = 83%  Patient Saturations on 2 Liters of oxygen while Ambulating = 93%  Please briefly explain why patient needs home oxygen:

## 2016-03-02 NOTE — Progress Notes (Signed)
Home Oxygen ordered as requested and to be delivered to the room today prior to discharging home. Mindi Slicker [facility] [phone]

## 2016-03-02 NOTE — Progress Notes (Signed)
  Echocardiogram 2D Echocardiogram has been performed.  Jennette Dubin 03/02/2016, 1:24 PM

## 2016-03-02 NOTE — Progress Notes (Signed)
Pt has orders to be discharged. Discharge instructions given and pt has no additional questions at this time. Medication regimen reviewed and pt educated. Pt verbalized understanding and has no additional questions. Telemetry box removed. IV removed and site in good condition. Patient did complain of epigastric pain. MD notified. Patient given maalox with relief.     Pt now stable and waiting for transportation.

## 2016-03-04 ENCOUNTER — Other Ambulatory Visit (HOSPITAL_COMMUNITY): Payer: Self-pay | Admitting: Internal Medicine

## 2016-03-05 ENCOUNTER — Ambulatory Visit
Admission: RE | Admit: 2016-03-05 | Discharge: 2016-03-05 | Disposition: A | Discharge status: Home / Self Care | Payer: Medicare HMO | Source: Ambulatory Visit | Attending: Internal Medicine | Admitting: Internal Medicine

## 2016-03-05 DIAGNOSIS — R928 Other abnormal and inconclusive findings on diagnostic imaging of breast: Secondary | ICD-10-CM

## 2016-03-09 DIAGNOSIS — I2510 Atherosclerotic heart disease of native coronary artery without angina pectoris: Secondary | ICD-10-CM | POA: Diagnosis not present

## 2016-03-09 DIAGNOSIS — I480 Paroxysmal atrial fibrillation: Secondary | ICD-10-CM | POA: Diagnosis not present

## 2016-03-09 DIAGNOSIS — J189 Pneumonia, unspecified organism: Secondary | ICD-10-CM | POA: Diagnosis not present

## 2016-03-09 DIAGNOSIS — J45909 Unspecified asthma, uncomplicated: Secondary | ICD-10-CM | POA: Diagnosis not present

## 2016-03-09 DIAGNOSIS — Z6832 Body mass index (BMI) 32.0-32.9, adult: Secondary | ICD-10-CM | POA: Diagnosis not present

## 2016-03-09 DIAGNOSIS — I10 Essential (primary) hypertension: Secondary | ICD-10-CM | POA: Diagnosis not present

## 2016-03-09 DIAGNOSIS — J9601 Acute respiratory failure with hypoxia: Secondary | ICD-10-CM | POA: Diagnosis not present

## 2016-03-11 ENCOUNTER — Encounter (HOSPITAL_COMMUNITY): Payer: Self-pay | Admitting: Emergency Medicine

## 2016-03-11 ENCOUNTER — Emergency Department (HOSPITAL_COMMUNITY)
Admission: EM | Admit: 2016-03-11 | Discharge: 2016-03-11 | Disposition: A | Discharge status: Home / Self Care | Payer: Medicare HMO | Attending: Emergency Medicine | Admitting: Emergency Medicine

## 2016-03-11 ENCOUNTER — Emergency Department (HOSPITAL_COMMUNITY): Payer: Medicare HMO

## 2016-03-11 DIAGNOSIS — E119 Type 2 diabetes mellitus without complications: Secondary | ICD-10-CM | POA: Insufficient documentation

## 2016-03-11 DIAGNOSIS — I509 Heart failure, unspecified: Secondary | ICD-10-CM | POA: Insufficient documentation

## 2016-03-11 DIAGNOSIS — I481 Persistent atrial fibrillation: Secondary | ICD-10-CM | POA: Diagnosis not present

## 2016-03-11 DIAGNOSIS — R079 Chest pain, unspecified: Secondary | ICD-10-CM

## 2016-03-11 DIAGNOSIS — I110 Hypertensive heart disease with heart failure: Secondary | ICD-10-CM | POA: Insufficient documentation

## 2016-03-11 DIAGNOSIS — Z96641 Presence of right artificial hip joint: Secondary | ICD-10-CM | POA: Insufficient documentation

## 2016-03-11 DIAGNOSIS — Z7901 Long term (current) use of anticoagulants: Secondary | ICD-10-CM | POA: Insufficient documentation

## 2016-03-11 DIAGNOSIS — R0789 Other chest pain: Secondary | ICD-10-CM | POA: Diagnosis not present

## 2016-03-11 DIAGNOSIS — I2510 Atherosclerotic heart disease of native coronary artery without angina pectoris: Secondary | ICD-10-CM | POA: Diagnosis not present

## 2016-03-11 LAB — COMPREHENSIVE METABOLIC PANEL
ALBUMIN: 3.7 g/dL (ref 3.5–5.0)
ALT: 49 U/L (ref 14–54)
ANION GAP: 8 (ref 5–15)
AST: 36 U/L (ref 15–41)
Alkaline Phosphatase: 100 U/L (ref 38–126)
BUN: 18 mg/dL (ref 6–20)
CHLORIDE: 102 mmol/L (ref 101–111)
CO2: 28 mmol/L (ref 22–32)
Calcium: 9.5 mg/dL (ref 8.9–10.3)
Creatinine, Ser: 1.02 mg/dL — ABNORMAL HIGH (ref 0.44–1.00)
GFR calc Af Amer: 58 mL/min — ABNORMAL LOW (ref >60)
GFR calc non Af Amer: 50 mL/min — ABNORMAL LOW (ref >60)
GLUCOSE: 95 mg/dL (ref 65–99)
POTASSIUM: 4.2 mmol/L (ref 3.5–5.1)
Sodium: 138 mmol/L (ref 135–145)
TOTAL PROTEIN: 6.7 g/dL (ref 6.5–8.1)
Total Bilirubin: 0.8 mg/dL (ref 0.3–1.2)

## 2016-03-11 LAB — CBC
HEMATOCRIT: 40.2 % (ref 36.0–46.0)
Hemoglobin: 13.2 g/dL (ref 12.0–15.0)
MCH: 31.4 pg (ref 26.0–34.0)
MCHC: 32.8 g/dL (ref 30.0–36.0)
MCV: 95.7 fL (ref 78.0–100.0)
Platelets: 224 10*3/uL (ref 150–400)
RBC: 4.2 MIL/uL (ref 3.87–5.11)
RDW: 13.2 % (ref 11.5–15.5)
WBC: 5.3 10*3/uL (ref 4.0–10.5)

## 2016-03-11 LAB — TROPONIN I
Troponin I: <0.03 ng/mL (ref <0.03)
Troponin I: <0.03 ng/mL (ref <0.03)

## 2016-03-11 MED ORDER — METOPROLOL SUCCINATE ER 25 MG PO TB24: 12.5000 mg | ORAL_TABLET | Freq: Every evening | ORAL | 0 refills | Status: DC

## 2016-03-11 MED ORDER — NITROGLYCERIN 0.4 MG SL SUBL: 0.4000 mg | SUBLINGUAL_TABLET | SUBLINGUAL | Status: DC | PRN

## 2016-03-11 MED ORDER — IOPAMIDOL (ISOVUE-370) INJECTION 76%
INTRAVENOUS | Status: AC
  Administered 2016-03-11: 100 mL
  Filled 2016-03-11: qty 100

## 2016-03-11 MED ORDER — METOPROLOL SUCCINATE ER 25 MG PO TB24: 25.0000 mg | ORAL_TABLET | ORAL | 0 refills | Status: DC

## 2016-03-11 NOTE — ED Notes (Signed)
Pt resting comfortably with family at bedside.  Denies any pain at this time.

## 2016-03-11 NOTE — ED Provider Notes (Signed)
[place] DEPT Provider Note   CSN: [id number] Arrival date & time: 03/11/16  1402     History   Chief Complaint Chief Complaint  Patient presents with  . Chest Pain    HPI Melinda [name] is a 83 y.o. female.  83 yo F w/ h/o CHF and recent cath with some mild diffuse CAD here with acute onset of central chest pressure that radiated towards her back and up to bilateral jaws and arms. This is associated with diaphoresis and nausea but no vomiting. Some mild lightheadedness but no other symptoms. Pain resolved prior to EMS arrival. EMS gave aspirin and EKG is okay.    Chest Pain   This is a recurrent problem. The current episode started 1 to 2 hours ago. The problem has been resolved. The pain is associated with movement. The pain is present in the substernal region and lateral region. The pain is moderate. The quality of the pain is described as dull and pressure-like. The pain radiates to the upper back, left jaw, right jaw, right arm and left arm. Duration of episode(s) is 15 minutes. Pertinent negatives include no abdominal pain, no cough, no fever and no leg pain. She has tried nothing for the symptoms. The treatment provided no relief. There are no known risk factors.  Her past medical history is significant for CAD and hypertension.    Past Medical History:  Diagnosis Date  . Acid reflux   . Anxiety   . Arthritis   . Cataract    surgery,bilateral  . Constipation   . Depression   . Diabetes mellitus without complication ([place])   . Dyspnea on exertion    a. 07/2015 Echo: EF 55-60%, no rwma, mild MR, mod dil LA/RA/RV, mod TR, triv PR, PASP 94mHg.  . Family history of adverse reaction to anesthesia    mother always had n/v  . Hx: UTI (urinary tract infection)   . Hypercholesterolemia   . Hypertensive heart disease   . Insomnia   . Multifactorial gait disorder   . Non-obstructive CAD    a. 07/2015 Cath: LM 30-40d, LAD min irregs, LCX min irregs, RCA 30p, RA 17,  RV 33/7, PA 42/19, PCWP 17.  . Osteoarthritis    a. 06/2015 s/p R total hip arthroplasty.  .Marland KitchenPAF (paroxysmal atrial fibrillation) ([facility]    a. Dx 07/2015, CHA2DS2VASc = 6-->Eliquis.  . Premature atrial contractions   . Pulmonary hypertension    a. 07/2015 Echo: PASP 596mg.  . Marland Kitchenyloric antral stenosis    in childhood,infancy  . Sleep apnea with use of continuous positive airway pressure (CPAP) 10/19/2012   a. compliant w/ CPAP.    Patient Active Problem List   Diagnosis Date Noted  . CHF exacerbation ([facility]12/24/2017  . Acute respiratory failure with hypoxia ([facility]/24/2017  . Persistent atrial fibrillation ([facility]  . Hypertensive heart disease   . Hypercholesterolemia   . Pulmonary hypertension   . Dyspnea on exertion   . Non-obstructive CAD   . Chest pain, moderate coronary artery risk 07/14/2015  . Pain in the chest   . PAF (paroxysmal atrial fibrillation) ([facility])   . Pulmonary arterial hypertension   . Chest pain with moderate risk for cardiac etiology 07/08/2015  . Dyspnea 07/08/2015  . Essential hypertension 07/08/2015  . Hyperlipidemia 07/08/2015  . Osteoarthritis of right hip 06/26/2015  . Hip joint replacement status 06/26/2015  . OSA on CPAP 03/27/2015  . Ataxia 03/27/2015  . Falls 03/27/2015  . Insomnia, controlled  03/27/2015  . Insomnia with sleep apnea 03/22/2014  . Adjustment disorder with mixed anxiety and depressed mood 03/22/2014  . Sleep apnea with use of continuous positive airway pressure (CPAP) 10/19/2012  . Multifactorial gait disorder     Past Surgical History:  Procedure Laterality Date  . ABDOMINAL HYSTERECTOMY  1970  . ABDOMINAL SURGERY     as a 67 day old baby  . CARDIAC CATHETERIZATION N/A 07/14/2015   Procedure: Right/Left Heart Cath and Coronary Angiography;  Surgeon: Sanda Klein, MD;  Location: [facility] LAB;  Service: Cardiovascular;  Laterality: N/A;  . CARDIOVERSION N/A 11/04/2015   Procedure: CARDIOVERSION;  Surgeon: Lelon Perla,  MD;  Location: [facility] ENDOSCOPY;  Service: Cardiovascular;  Laterality: N/A;  . CARDIOVERSION N/A 02/25/2016   Procedure: CARDIOVERSION;  Surgeon: Pixie Casino, MD;  Location: [facility] ENDOSCOPY;  Service: Cardiovascular;  Laterality: N/A;  . COLONOSCOPY    . EYE SURGERY Bilateral    cataract surgery with lens implant  . INNER EAR SURGERY Left    x 3  . LAPAROSCOPIC SALPINGO OOPHERECTOMY    . NM MYOVIEW LTD  11/30/2007   normal stress nuclear study/EF-83%  . TONSILLECTOMY    . TOTAL HIP ARTHROPLASTY Right 06/26/2015   Procedure: RIGHT TOTAL HIP ARTHROPLASTY ANTERIOR APPROACH;  Surgeon: Leandrew Koyanagi, MD;  Location: [facility];  Service: Orthopedics;  Laterality: Right;  . TRANSTHORACIC ECHOCARDIOGRAM  07/23/2009   mild concentric LVH/mild mitral insufficiency/ moderate tricuspid innsufficiency/mild pulmonary HTN/EF- 55-60%  . VAGINAL DELIVERY      OB History    No data available       Home Medications    Prior to Admission medications   Medication Sig Start Date End Date Taking? Authorizing Provider  acetaminophen (TYLENOL) 650 MG CR tablet Take 650 mg by mouth 2 (two) times daily as needed (arthritis pain).     Historical Provider, MD  alum & mag hydroxide-simeth (MAALOX/MYLANTA) 200-200-20 MG/5ML suspension Take 15 mLs by mouth every 4 (four) hours as needed for indigestion or heartburn. 03/02/16   Lavina Hamman, MD  amLODipine (NORVASC) 5 MG tablet Take 5 mg by mouth daily.    Historical Provider, MD  apixaban (ELIQUIS) 5 MG TABS tablet Take 1 tablet (5 mg total) by mouth 2 (two) times daily. 07/15/15   Robbie Lis, MD  atorvastatin (LIPITOR) 40 MG tablet Take 40 mg by mouth daily.     Historical Provider, MD  benzonatate (TESSALON) 100 MG capsule Take 1 capsule (100 mg total) by mouth 3 (three) times daily as needed for cough. 03/02/16   Lavina Hamman, MD  Blood Glucose Monitoring Suppl (ONE TOUCH ULTRA 2) W/DEVICE KIT Reported on 07/11/2015 06/21/13   Historical Provider, MD  Calcium  Carbonate-Vitamin D3 (CALCIUM 600+D3) 600-400 MG-UNIT TABS Take 1 tablet by mouth daily.    Historical Provider, MD  cholecalciferol (VITAMIN D) 1000 UNITS tablet Take 1,000 Units by mouth daily.     Historical Provider, MD  desipramine (NORPRAMIN) 25 MG tablet Take 25 mg by mouth daily.    Historical Provider, MD  diphenhydrAMINE (BENADRYL) 25 mg capsule Take 1 capsule (25 mg total) by mouth every 6 (six) hours as needed for itching or allergies (sore throat). 03/02/16   Lavina Hamman, MD  Glucosamine HCl (GLUCOSAMINE PO) Take 1 tablet by mouth daily.    Historical Provider, MD  glucosamine-chondroitin 500-400 MG tablet Take 1 tablet by mouth every other day.     Historical Provider, MD  glucose blood (ACCU-CHEK ACTIVE STRIPS) test strip 1 each by Other route as needed for other. Reported on 07/11/2015    Historical Provider, MD  guaiFENesin (MUCINEX) 600 MG 12 hr tablet Take 1 tablet (600 mg total) by mouth 2 (two) times daily. 03/02/16   Lavina Hamman, MD  linaclotide (LINZESS) 145 MCG CAPS capsule Take 145 mcg by mouth daily before breakfast.     Historical Provider, MD  LORazepam (ATIVAN) 0.5 MG tablet Take 0.5 mg by mouth at bedtime.  12/26/15   Historical Provider, MD  menthol-cetylpyridinium (CEPACOL) 3 MG lozenge Take 1 lozenge (3 mg total) by mouth as needed for sore throat. 03/02/16   Lavina Hamman, MD  metoprolol succinate (TOPROL-XL) 25 MG 24 hr tablet Take 1 tablet (25 mg total) by mouth daily. 03/03/16   Lavina Hamman, MD  omeprazole (PRILOSEC) 20 MG capsule Take 20 mg by mouth daily.     Historical Provider, MD  Jonetta Speak LANCETS [address] Reported on 07/11/2015 06/21/13   Historical Provider, MD  Polyethyl Glycol-Propyl Glycol (SYSTANE) 0.4-0.3 % GEL ophthalmic gel Place 1 application into both eyes at bedtime as needed (dry eyes).    Historical Provider, MD  polyethylene glycol (MIRALAX / GLYCOLAX) packet Take 17 g by mouth daily. Mix in 8 oz liquid and drink    Historical  Provider, MD  [place] into the lungs at bedtime. Uses CPAP with oxygen    Historical Provider, MD  torsemide (DEMADEX) 10 MG tablet Take 1 tablet (10 mg total) by mouth daily. 03/03/16   Lavina Hamman, MD  vitamin B-12 (CYANOCOBALAMIN) 1000 MCG tablet Take 1,000 mcg by mouth daily.    Historical Provider, MD    Family History Family History  Problem Relation Age of Onset  . Heart attack Father   . Heart failure Mother   . Cancer Mother     colon,kidney  . Diabetes Sister   . Hypertension Sister   . Heart failure Sister     Social History Social History  Substance Use Topics  . Smoking status: Never Smoker  . Smokeless tobacco: Never Used  . Alcohol use No     Comment: rare     Allergies   Lasix [furosemide]; Phenobarbital; and Amitiza [lubiprostone]   Review of Systems Review of Systems  Constitutional: Negative for fever.  Respiratory: Negative for cough.   Cardiovascular: Positive for chest pain.  Gastrointestinal: Negative for abdominal pain.  All other systems reviewed and are negative.    Physical Exam Updated Vital Signs BP 110/62   Pulse 92   Temp 98.1 F (36.7 C) (Oral)   Resp (!) 28   Ht 5' (1.524 m)   Wt 164 lb (74.4 kg)   SpO2 98%   BMI 32.03 kg/m   Physical Exam  Constitutional: She is oriented to person, place, and time. She appears well-developed and well-nourished.  HENT:  Head: Normocephalic and atraumatic.  Eyes: Conjunctivae and EOM are normal.  Neck: Normal range of motion.  Cardiovascular: Normal rate and regular rhythm.   Pulmonary/Chest: No stridor. No respiratory distress. She has no wheezes. She has no rales.  Abdominal: Soft. Bowel sounds are normal. She exhibits no distension.  Neurological: She is alert and oriented to person, place, and time. No cranial nerve deficit. Coordination normal.  Skin: Skin is warm and dry.  Nursing note and vitals reviewed.    ED Treatments / Results  Labs (all labs  ordered are listed, but only  abnormal results are displayed) Labs Reviewed  COMPREHENSIVE METABOLIC PANEL - Abnormal; Notable for the following:       Result Value   Creatinine, Ser 1.02 (*)    GFR calc non Af Amer 50 (*)    GFR calc Af Amer 58 (*)    All other components within normal limits  CBC  TROPONIN I    EKG  EKG Interpretation  Date/Time:  Thursday March 11 2016 14:54:20 EST Ventricular Rate:  102 PR Interval:    QRS Duration: 105 QT Interval:  375 QTC Calculation: 489 R Axis:   -63 Text Interpretation:  Atrial fibrillation LAD, consider left anterior fascicular block Anterior infarct, old nsclt earlier in day afib new since december Confirmed by [facility] MD, Corene Cornea [phone]) on 03/11/2016 3:16:05 PM       Radiology No results found.  Procedures Procedures (including critical care time)  Medications Ordered in ED Medications  nitroGLYCERIN (NITROSTAT) SL tablet 0.4 mg (not administered)     Initial Impression / Assessment and Plan / ED Course  I have reviewed the triage vital signs and the nursing notes.  Pertinent labs & imaging results that were available during my care of the patient were reviewed by me and considered in my medical decision making (see chart for details).  Clinical Course    Good story for typical sounding chest pain. ASA given by EMS. No NTG needed as synmptoms resolvedInitial ecg and troponin engative. Plan for cardiology consult for recommendations.    Final Clinical Impressions(s) / ED Diagnoses   Final diagnoses:  None    New Prescriptions New Prescriptions   No medications on file     Merrily Pew, MD 03/12/16 [phone]

## 2016-03-11 NOTE — ED Notes (Signed)
Cards at bedside

## 2016-03-11 NOTE — ED Notes (Signed)
Patient transported to X-ray 

## 2016-03-11 NOTE — ED Notes (Signed)
Patient transported to CT 

## 2016-03-11 NOTE — ED Notes (Signed)
Signature pad  Not working

## 2016-03-11 NOTE — ED Triage Notes (Signed)
Pt to ED via  [facility] from home with c/o sudden onset back pain that radiated to jaw on right and left, then started having chest pain. C/o feeling nauseated, diaphoretic at that time also.  Pt is a/o x 4, w/d. Pain free at present. Received ASA 324mg  po enroute

## 2016-03-11 NOTE — ED Provider Notes (Addendum)
Please see previous physicians note regarding patient's presenting history and physical, initial ED course, and associated medical decision making.  Pending cardiology evaluation in the emergency department. Was seen by cardiology who felt that patient would be safely discharged home if serial troponin was negative. Also recommending CTA of the aorta to rule out dissection. Both of these studies are performed. Her serial troponin is negative. CT angio does not show signs of dissection. Cardiology will set up appropriate follow-up for patient. Strict return and follow-up instructions reviewed. She expressed understanding of all discharge instructions and felt comfortable with the plan of care.    Forde Dandy, MD 03/11/16 2036  Per cardiology, patient to take toprol xl 25 mg in AM and 12.5 mg in PM. This is prescribed for patient.   Forde Dandy, MD 03/11/16 2041

## 2016-03-11 NOTE — Discharge Instructions (Addendum)
Your heart work-up is reassuring today and your CT scan of the aorta was reassuring. Our cardiologist felt you can be discharged home.   They would like for you to take Toprol 25 mg in the morning and 12.5 mg in the afternoon.   You will be notified about follow-up appointment with cardiology. Please call if you do not hear back.  Return without fail for worsening symptoms, including recurrent chest pain, difficulty breathing, passing out, or any other symptoms concerning to you.

## 2016-03-11 NOTE — ED Notes (Signed)
Pt ambulated to the BR with steady gait.   

## 2016-03-11 NOTE — Consult Note (Signed)
Patient ID: Melinda [name] MRN: [id number], DOB/AGE: Feb 11, 1932   Admit date: 03/11/2016  Requesting Physician: Dr. Oleta Mouse Primary Physician: Jerlyn Ly, MD Primary Cardiologist: Dr. Martinique  Reason for admission: chest pain   Pt. Profile:  Melinda [name] is a 83 y.o. female with a history of 83 year old female with a prior history of hypertension, hyperlipidemia, diabetes, OSA on CPAP, obesity, PAFib/flutter, chronic diastolic CHF and non obst CAD who presented to [facility] today with chest pain.   She was initially seen in early May by Dr. Martinique for progressive dyspnea. ECHO and stress test was recommended. However, later that month she presented to the ER with crushing chest pain. She ruled out for MI. It was noted that she was in rate controlled A. Fib/flutter and would require oral anticoagulation. Echocardiogram showed normal LV function with a pulmonary artery systolic pressure of 50 mmHg. Right left heart catheterization showed mild nonobstructive CAD with mild pulmonary hypertension and mildly elevated right heart pressures. She was subsequent discharged home the symptoms were not felt to be cardiac in origin. She was stared on Eliquis.  She underwent pulmonary function testing as an outpatient and these showed good lung volumes with minimal obstruction and decreased diffusion capacity.   She was seen in the office for follow up. She was started on lasix for some LE edema, this was later stopped due to severe pruritis. She continued to have DOE and Dr. Martinique felt this was related to OSA, deconditioning and impaired diffusion capacity vs afib. However, she was set up for a DCCV to see if this would help which was performed on 11/04/15. This was successful until 01/27/16 when she noted palpiations and was noted to back in atrial flutter. She underwent a second DCCV on 02/2016 which was successful. She was then admitted 97/74-14/23/95 with A/C diastolic CHF and CAP. She was treated  with torsemide (given allergy to lasix) and Abx. Discharged on torsemide 98m daily and home 02. She was noted to remain in NSR that admission.   She was feeling quite well until earlier today when she has sudden onset of back pain, teeth pain and chest pain. She had associated SOB, diaphoresis and nausea. She called EMS but by the time she got to the hospital her sx had resolved. Now chest pain free. She reports feeling much better in terms of her dyspnea. No LE edema, orthopnea or PND. No dizziness or syncope.     Problem List  Past Medical History:  Diagnosis Date  . Acid reflux   . Anxiety   . Arthritis   . Cataract    surgery,bilateral  . Constipation   . Depression   . Diabetes mellitus without complication ([facility]   . Dyspnea on exertion    a. 07/2015 Echo: EF 55-60%, no rwma, mild MR, mod dil LA/RA/RV, mod TR, triv PR, PASP 524mg.  . Family history of adverse reaction to anesthesia    mother always had n/v  . Hx: UTI (urinary tract infection)   . Hypercholesterolemia   . Hypertensive heart disease   . Insomnia   . Multifactorial gait disorder   . Non-obstructive CAD    a. 07/2015 Cath: LM 30-40d, LAD min irregs, LCX min irregs, RCA 30p, RA 17, RV 33/7, PA 42/19, PCWP 17.  . Osteoarthritis    a. 06/2015 s/p R total hip arthroplasty.  . Marland KitchenAF (paroxysmal atrial fibrillation) ([place]   a. Dx 07/2015, CHA2DS2VASc = 6-->Eliquis.  . Premature atrial contractions   .  Pulmonary hypertension    a. 07/2015 Echo: PASP 54mHg.  .Marland KitchenPyloric antral stenosis    in childhood,infancy  . Sleep apnea with use of continuous positive airway pressure (CPAP) 10/19/2012   a. compliant w/ CPAP.    Past Surgical History:  Procedure Laterality Date  . ABDOMINAL HYSTERECTOMY  1970  . ABDOMINAL SURGERY     as a 971day old baby  . CARDIAC CATHETERIZATION N/A 07/14/2015   Procedure: Right/Left Heart Cath and Coronary Angiography;  Surgeon: MSanda Klein MD;  [facility];  Service:  Cardiovascular;  Laterality: N/A;  . CARDIOVERSION N/A 11/04/2015   Procedure: CARDIOVERSION;  Surgeon: BLelon Perla MD;  [facility];  Service: Cardiovascular;  Laterality: N/A;  . CARDIOVERSION N/A 02/25/2016   Procedure: CARDIOVERSION;  Surgeon: KPixie Casino MD;  [facility];  Service: Cardiovascular;  Laterality: N/A;  . COLONOSCOPY    . EYE SURGERY Bilateral    cataract surgery with lens implant  . INNER EAR SURGERY Left    x 3  . LAPAROSCOPIC SALPINGO OOPHERECTOMY    . NM MYOVIEW LTD  11/30/2007   normal stress nuclear study/EF-83%  . TONSILLECTOMY    . TOTAL HIP ARTHROPLASTY Right 06/26/2015   Procedure: RIGHT TOTAL HIP ARTHROPLASTY ANTERIOR APPROACH;  Surgeon: NLeandrew Koyanagi MD;  [place] [facility]  Service: Orthopedics;  Laterality: Right;  . TRANSTHORACIC ECHOCARDIOGRAM  07/23/2009   mild concentric LVH/mild mitral insufficiency/ moderate tricuspid innsufficiency/mild pulmonary HTN/EF- 55-60%  . VAGINAL DELIVERY       Allergies  Allergies  Allergen Reactions  . Lasix [Furosemide] Itching  . Phenobarbital Itching  . Amitiza [Lubiprostone] Itching     Home Medications  Prior to Admission medications   Medication Sig Start Date End Date Taking? Authorizing Provider  acetaminophen (TYLENOL) 650 MG CR tablet Take 650 mg by mouth 2 (two) times daily as needed (arthritis pain).     Historical Provider, MD  alum & mag hydroxide-simeth (MAALOX/MYLANTA) 200-200-20 MG/5ML suspension Take 15 mLs by mouth every 4 (four) hours as needed for indigestion or heartburn. 03/02/16   PLavina Hamman MD  amLODipine (NORVASC) 5 MG tablet Take 5 mg by mouth daily.    Historical Provider, MD  apixaban (ELIQUIS) 5 MG TABS tablet Take 1 tablet (5 mg total) by mouth 2 (two) times daily. 07/15/15   ARobbie Lis MD  atorvastatin (LIPITOR) 40 MG tablet Take 40 mg by mouth daily.     Historical Provider, MD  benzonatate (TESSALON) 100 MG capsule Take 1 capsule (100 mg total)  by mouth 3 (three) times daily as needed for cough. 03/02/16   PLavina Hamman MD  Blood Glucose Monitoring Suppl (ONE TOUCH ULTRA 2) W/DEVICE KIT Reported on 07/11/2015 06/21/13   Historical Provider, MD  Calcium Carbonate-Vitamin D3 (CALCIUM 600+D3) 600-400 MG-UNIT TABS Take 1 tablet by mouth daily.    Historical Provider, MD  cholecalciferol (VITAMIN D) 1000 UNITS tablet Take 1,000 Units by mouth daily.     Historical Provider, MD  desipramine (NORPRAMIN) 25 MG tablet Take 25 mg by mouth daily.    Historical Provider, MD  diphenhydrAMINE (BENADRYL) 25 mg capsule Take 1 capsule (25 mg total) by mouth every 6 (six) hours as needed for itching or allergies (sore throat). 03/02/16   PLavina Hamman MD  Glucosamine HCl (GLUCOSAMINE PO) Take 1 tablet by mouth daily.    Historical Provider, MD  glucosamine-chondroitin 500-400 MG tablet Take 1 tablet by mouth every  other day.     Historical Provider, MD  glucose blood (ACCU-CHEK ACTIVE STRIPS) test strip 1 each by Other route as needed for other. Reported on 07/11/2015    Historical Provider, MD  guaiFENesin (MUCINEX) 600 MG 12 hr tablet Take 1 tablet (600 mg total) by mouth 2 (two) times daily. 03/02/16   Lavina Hamman, MD  linaclotide (LINZESS) 145 MCG CAPS capsule Take 145 mcg by mouth daily before breakfast.     Historical Provider, MD  LORazepam (ATIVAN) 0.5 MG tablet Take 0.5 mg by mouth at bedtime.  12/26/15   Historical Provider, MD  menthol-cetylpyridinium (CEPACOL) 3 MG lozenge Take 1 lozenge (3 mg total) by mouth as needed for sore throat. 03/02/16   Lavina Hamman, MD  metoprolol succinate (TOPROL-XL) 25 MG 24 hr tablet Take 1 tablet (25 mg total) by mouth daily. 03/03/16   Lavina Hamman, MD  omeprazole (PRILOSEC) 20 MG capsule Take 20 mg by mouth daily.     Historical Provider, MD  Jonetta Speak LANCETS 65H [place] Reported on 07/11/2015 06/21/13   Historical Provider, MD  Polyethyl Glycol-Propyl Glycol (SYSTANE) 0.4-0.3 % GEL ophthalmic gel Place 1  application into both eyes at bedtime as needed (dry eyes).    Historical Provider, MD  polyethylene glycol (MIRALAX / GLYCOLAX) packet Take 17 g by mouth daily. Mix in 8 oz liquid and drink    Historical Provider, MD  Percy into the lungs at bedtime. Uses CPAP with oxygen    Historical Provider, MD  torsemide (DEMADEX) 10 MG tablet Take 1 tablet (10 mg total) by mouth daily. 03/03/16   Lavina Hamman, MD  vitamin B-12 (CYANOCOBALAMIN) 1000 MCG tablet Take 1,000 mcg by mouth daily.    Historical Provider, MD    Family History  Family History  Problem Relation Age of Onset  . Heart attack Father   . Heart failure Mother   . Cancer Mother     colon,kidney  . Diabetes Sister   . Hypertension Sister   . Heart failure Sister    Family Status  Relation Status  . Father Deceased at age 26   MI  . Mother Deceased   CHF  . Sister Alive   Palpitations  . Sister Alive     Social History  Social History   Social History  . Marital status: Married    Spouse name: N/A  . Number of children: 1  . Years of education: 75   Occupational History  . Retired    Social History Main Topics  . Smoking status: Never Smoker  . Smokeless tobacco: Never Used  . Alcohol use No     Comment: rare  . Drug use: No  . Sexual activity: Not on file   Other Topics Concern  . Not on file   Social History Narrative   Patient lives alone.    Patient is widowed.    Patient retired.    Patient some college.    Patient has one child.    Caffeine 1-2 cups daily avg.     Review of Systems General:  No chills, fever, night sweats or weight changes.  Cardiovascular:+++ chest pain, dyspnea on exertion, NO edema, orthopnea, palpitations, paroxysmal nocturnal dyspnea. Dermatological: No rash, lesions/masses Respiratory: No cough, dyspnea Urologic: No hematuria, dysuria Abdominal:   No nausea, vomiting, diarrhea, bright red blood per rectum, melena, or  hematemesis Neurologic:  No visual changes, wkns, changes in mental status. All other systems reviewed  and are otherwise negative except as noted above.  Physical Exam  Blood pressure 110/62, pulse 92, temperature 98.1 F (36.7 C), temperature source Oral, resp. rate (!) 28, height 5' (1.524 m), weight 164 lb (74.4 kg), SpO2 98 %.  General: Pleasant, NAD, overweight  Psych: Normal affect. Neuro: Alert and oriented X 3. Moves all extremities spontaneously. HEENT: Normal  Neck: Supple without bruits or JVD. Lungs:  Resp regular and unlabored, CTA. Heart: irreg irreg no s3, s4, or murmurs. Abdomen: Soft, non-tender, non-distended, BS + x 4.  Extremities: No clubbing, cyanosis or edema. DP/PT/Radials 2+ and equal bilaterally.  Labs   Recent Labs  03/11/16 1535  TROPONINI <0.03   Lab Results  Component Value Date   WBC 5.3 03/11/2016   HGB 13.2 03/11/2016   HCT 40.2 03/11/2016   MCV 95.7 03/11/2016   PLT 224 03/11/2016    Recent Labs Lab 03/11/16 1535  NA 138  K 4.2  CL 102  CO2 28  BUN 18  CREATININE 1.02*  CALCIUM 9.5  PROT 6.7  BILITOT 0.8  ALKPHOS 100  ALT 49  AST 36  GLUCOSE 95   No results found for: CHOL, HDL, LDLCALC, TRIG No results found for: DDIMER   Radiology/Studies  Dg Chest 2 View  Result Date: 03/11/2016 CLINICAL DATA:  Chest pain EXAM: CHEST  2 VIEW COMPARISON:  02/29/2016 FINDINGS: Borderline cardiomegaly. No infiltrate or pleural effusion. No pulmonary edema. Osteopenia and degenerative changes thoracic spine. IMPRESSION: No active cardiopulmonary disease. Electronically Signed   By: Lahoma Crocker M.D.   On: 03/11/2016 16:35   Dg Chest 2 View  Result Date: 02/29/2016 CLINICAL DATA:  Shortness of Breath for 2 months EXAM: CHEST  2 VIEW COMPARISON:  12/17/2015 FINDINGS: Cardiac shadow is stable. Lungs are well aerated bilaterally. Mild increased vascularity is again seen and stable. Increased density is noted in the left lung base posteriorly  consistent with left lower lobe infiltrate. No acute bony abnormality is noted. IMPRESSION: Mild left lower lobe infiltrate. Electronically Signed   By: Inez Catalina M.D.   On: 02/29/2016 19:28   Mm Diag Breast Tomo Uni Left  Result Date: 03/05/2016 CLINICAL DATA:  Recall from screening mammography with tomosynthesis, possible mass or focal asymmetry in the upper inner left breast at middle depth. Benign core needle biopsy x 2 of the inner left breast in December, 2016. EXAM: 2D DIGITAL DIAGNOSTIC LEFT MAMMOGRAM WITH ADJUNCT TOMO COMPARISON:  02/23/2016, 02/24/2015 and earlier. ACR Breast Density Category b: There are scattered areas of fibroglandular density. FINDINGS: Standard and tomosynthesis spot-compression CC and MLO views of the area of concern in the upper inner left breast were obtained. No persistent mass, architectural distortion or suspicious calcification in the area of concern on screening mammogram. IMPRESSION: No mammographic evidence of malignancy, left breast. The area of concern on the screening mammogram is consistent with a summation of overlapping fibroglandular tissue. RECOMMENDATION: Screening mammogram in one year.(Code:SM-B-01Y) I have discussed the findings and recommendations with the patient. Results were also provided in writing at the conclusion of the visit. If applicable, a reminder letter will be sent to the patient regarding the next appointment. BI-RADS CATEGORY  1: Negative. Electronically Signed   By: Evangeline Dakin M.D.   On: 03/05/2016 16:22   Mm Screening Breast Tomo Bilateral  Result Date: 02/24/2016 CLINICAL DATA:  Screening. EXAM: 2D DIGITAL SCREENING BILATERAL MAMMOGRAM WITH CAD AND ADJUNCT TOMO COMPARISON:  Previous exam(s). ACR Breast Density Category b: There are scattered areas  of fibroglandular density. FINDINGS: In the left breast, a possible mass warrants further evaluation. In the right breast, no findings suspicious for malignancy. Images were  processed with CAD. IMPRESSION: Further evaluation is suggested for possible mass in the left breast. RECOMMENDATION: Diagnostic mammogram and possibly ultrasound of the left breast. (Code:FI-L-40M) The patient will be contacted regarding the findings, and additional imaging will be scheduled. BI-RADS CATEGORY  0: Incomplete. Need additional imaging evaluation and/or prior mammograms for comparison. Electronically Signed   By: Nolon Nations M.D.   On: 02/24/2016 16:40   Cath 07/14/15 IMPRESSIONS:  Mild coronary atherosclerosis, not hemodynamically significant. Minimally elevated LVEDP at rest, consistent with mild diastolic dysfunction. Mild pulmonary artery hypertension. Transpulmonary gradient >12 mm Hg suggests intrinsic pulmonary arteriolar hypertension, consistent with a pulmonary cause for PAH and dyspnea. RECOMMENDATION:  Check pulmonary function tests and reevaluate CPAP treatment settings for OSA. Symptoms appear to be non-cardiac in etiology. Statin therapy is probably appropriate. Echocardiogram should be performed, but would be more informative when she is in NSR. Start direct oral anticoagulant 4 hours after cath if no radial site bleeding.  ECG  Atrial flutter HR 102.   ASSESSMENT AND PLAN  Melinda [name] is a 83 y.o. female with a history of 83 year old female with a prior history of hypertension, hyperlipidemia, diabetes, OSA on CPAP, obesity, PAFib/flutter, chronic diastolic CHF and non obst CAD who presented to [facility] today with chest pain.   Chest pain: no objective signs of ischemia. She had a cath ~ 7 months ago with mild non obst CAD.  Will get CTA to rule out dissection given back pain and delta troponin. If both are negative, we will plan for discharge home with close follow up.   Persistent atrial fib/flutter: back in atrial flutter with mildly elevated rates. Will increase Toprol from 20m daily to 280min the AM and 12.56m29mn the PM. She has now failed 2 DCCVs.  Will set her up with the afib clinic for further management. She may need antiarrythmic therapy or pursue a rate control strategy. Continue Eliquis 56mg26mD for CHADSVAC of at least 6 (CHF, age, DM, vasc78 F sex).  Chronic diasolic CHF: appears euvolemic. Continue Torsemide 10 mg daily.   HTN: BP well controlled currently   HLD: continue statin   DMT2: diet controlled   OSA: cont CPAP   Signed, KathAngelena Form-C 03/11/2016, 4:50 PM  Pager [phone]

## 2016-03-13 DIAGNOSIS — R079 Chest pain, unspecified: Secondary | ICD-10-CM | POA: Insufficient documentation

## 2016-03-13 DIAGNOSIS — G4733 Obstructive sleep apnea (adult) (pediatric): Secondary | ICD-10-CM | POA: Diagnosis not present

## 2016-03-17 ENCOUNTER — Encounter (HOSPITAL_COMMUNITY): Payer: Self-pay | Admitting: Nurse Practitioner

## 2016-03-17 ENCOUNTER — Ambulatory Visit (HOSPITAL_COMMUNITY)
Admission: RE | Admit: 2016-03-17 | Discharge: 2016-03-17 | Disposition: A | Discharge status: Home / Self Care | Payer: Medicare HMO | Source: Ambulatory Visit | Attending: Nurse Practitioner | Admitting: Nurse Practitioner

## 2016-03-17 VITALS — BP 106/68 | HR 97 | Ht 60.0 in | Wt 169.0 lb

## 2016-03-17 DIAGNOSIS — G4730 Sleep apnea, unspecified: Secondary | ICD-10-CM | POA: Diagnosis not present

## 2016-03-17 DIAGNOSIS — Z8249 Family history of ischemic heart disease and other diseases of the circulatory system: Secondary | ICD-10-CM | POA: Insufficient documentation

## 2016-03-17 DIAGNOSIS — I272 Pulmonary hypertension, unspecified: Secondary | ICD-10-CM | POA: Diagnosis not present

## 2016-03-17 DIAGNOSIS — Z7902 Long term (current) use of antithrombotics/antiplatelets: Secondary | ICD-10-CM | POA: Insufficient documentation

## 2016-03-17 DIAGNOSIS — E119 Type 2 diabetes mellitus without complications: Secondary | ICD-10-CM | POA: Insufficient documentation

## 2016-03-17 DIAGNOSIS — M1990 Unspecified osteoarthritis, unspecified site: Secondary | ICD-10-CM | POA: Insufficient documentation

## 2016-03-17 DIAGNOSIS — I480 Paroxysmal atrial fibrillation: Secondary | ICD-10-CM | POA: Insufficient documentation

## 2016-03-17 DIAGNOSIS — I2510 Atherosclerotic heart disease of native coronary artery without angina pectoris: Secondary | ICD-10-CM | POA: Insufficient documentation

## 2016-03-17 DIAGNOSIS — I119 Hypertensive heart disease without heart failure: Secondary | ICD-10-CM | POA: Insufficient documentation

## 2016-03-17 DIAGNOSIS — Z87440 Personal history of urinary (tract) infections: Secondary | ICD-10-CM | POA: Diagnosis not present

## 2016-03-17 DIAGNOSIS — Z888 Allergy status to other drugs, medicaments and biological substances status: Secondary | ICD-10-CM | POA: Diagnosis not present

## 2016-03-17 DIAGNOSIS — E7800 Pure hypercholesterolemia, unspecified: Secondary | ICD-10-CM | POA: Diagnosis not present

## 2016-03-17 DIAGNOSIS — I481 Persistent atrial fibrillation: Secondary | ICD-10-CM | POA: Diagnosis not present

## 2016-03-17 DIAGNOSIS — K219 Gastro-esophageal reflux disease without esophagitis: Secondary | ICD-10-CM | POA: Insufficient documentation

## 2016-03-17 DIAGNOSIS — Z800 Family history of malignant neoplasm of digestive organs: Secondary | ICD-10-CM | POA: Diagnosis not present

## 2016-03-17 DIAGNOSIS — F329 Major depressive disorder, single episode, unspecified: Secondary | ICD-10-CM | POA: Insufficient documentation

## 2016-03-17 DIAGNOSIS — Z90710 Acquired absence of both cervix and uterus: Secondary | ICD-10-CM | POA: Diagnosis not present

## 2016-03-17 DIAGNOSIS — Z8051 Family history of malignant neoplasm of kidney: Secondary | ICD-10-CM | POA: Diagnosis not present

## 2016-03-17 DIAGNOSIS — Z96641 Presence of right artificial hip joint: Secondary | ICD-10-CM | POA: Diagnosis not present

## 2016-03-17 DIAGNOSIS — I4819 Other persistent atrial fibrillation: Secondary | ICD-10-CM

## 2016-03-17 DIAGNOSIS — I491 Atrial premature depolarization: Secondary | ICD-10-CM | POA: Diagnosis not present

## 2016-03-17 DIAGNOSIS — F419 Anxiety disorder, unspecified: Secondary | ICD-10-CM | POA: Insufficient documentation

## 2016-03-17 DIAGNOSIS — Z833 Family history of diabetes mellitus: Secondary | ICD-10-CM | POA: Diagnosis not present

## 2016-03-17 DIAGNOSIS — Z7984 Long term (current) use of oral hypoglycemic drugs: Secondary | ICD-10-CM | POA: Insufficient documentation

## 2016-03-17 LAB — TSH: TSH: 3.78 u[IU]/mL (ref 0.350–4.500)

## 2016-03-17 NOTE — Progress Notes (Signed)
Primary Care Physician: Jerlyn Ly, MD Referring Physician: [employer] [facility] f/u Cardiologist: Dr. Martinique   Melinda [name] is a 83 y.o. female with a h/o  HTN, HLD, CAD, DM type II, PAF on Eliquis; who presented to [facility], 12/24, with a reportedseveral month history of intermittent shortness of breath that acutely worsened over the last few days. She previously noticed shortness of breath symptoms when significantly exerting herself such as walking upstairs or walking long distances. However,over the last few days prior to admission, she could not walk even 2-3 steps without having to pause catch her breath. States that she's only on oxygen with her CPAP mask at night. Associated symptoms included fatigue, 2 pillow orthopnea, lower extremity swelling, wheezing,dry cough, and chills. Patient just underwent cardioversion, 12/20, for atrial fibrillation 4 days ago. This was her second time having this procedure done, previous was in May of this year.She reports taking her Eliquis regularly.   She's had no other recent changes to her medication regimen.    She left the hospital 12/24 and was in [place]. She was in the kitchen 1/4 and started having chest/neck/jaw pain. She was taken back to the ER and found to be back in afib with rvr.. She was discharged to home with f/u in the afib clinic. She is feeling at her baseline today and afib is controlled.  HR at home is controlled by reviewing pt's HR/BP log.. When afib first returned, her rates were higher. Echo shows severely enlarged rt/left atrium and with severe pulmonary HTN.   Today, she denies symptoms of palpitations, chest pain,, orthopnea, PND, lower extremity edema, dizziness, presyncope, syncope, or neurologic sequela. Chronic shortness of breath The patient is tolerating medications without difficulties and is otherwise without complaint today.   Past Medical History:  Diagnosis Date  . Acid reflux   . Anxiety   . Arthritis   . Cataract    surgery,bilateral  . Constipation   . Depression   . Diabetes mellitus without complication ([place])   . Dyspnea on exertion    a. 07/2015 Echo: EF 55-60%, no rwma, mild MR, mod dil LA/RA/RV, mod TR, triv PR, PASP 61mHg.  . Family history of adverse reaction to anesthesia    mother always had n/v  . Hx: UTI (urinary tract infection)   . Hypercholesterolemia   . Hypertensive heart disease   . Insomnia   . Multifactorial gait disorder   . Non-obstructive CAD    a. 07/2015 Cath: LM 30-40d, LAD min irregs, LCX min irregs, RCA 30p, RA 17, RV 33/7, PA 42/19, PCWP 17.  . Osteoarthritis    a. 06/2015 s/p R total hip arthroplasty.  .Marland KitchenPAF (paroxysmal atrial fibrillation) ([place]    a. Dx 07/2015, CHA2DS2VASc = 6-->Eliquis.  . Premature atrial contractions   . Pulmonary hypertension    a. 07/2015 Echo: PASP 525mg.  . Marland Kitchenyloric antral stenosis    in childhood,infancy  . Sleep apnea with use of continuous positive airway pressure (CPAP) 10/19/2012   a. compliant w/ CPAP.   Past Surgical History:  Procedure Laterality Date  . ABDOMINAL HYSTERECTOMY  1970  . ABDOMINAL SURGERY     as a [age]ay old baby  . CARDIAC CATHETERIZATION N/A 07/14/2015   Procedure: Right/Left Heart Cath and Coronary Angiography;  Surgeon: MiSanda KleinMD;  [facility];  Service: Cardiovascular;  Laterality: N/A;  . CARDIOVERSION N/A 11/04/2015   Procedure: CARDIOVERSION;  Surgeon: BrLelon PerlaMD;  [facility];  Service: Cardiovascular;  Laterality: N/A;  . CARDIOVERSION N/A 02/25/2016   Procedure: CARDIOVERSION;  Surgeon: Pixie Casino, MD;  Location: [facility]OSCOPY;  Service: Cardiovascular;  Laterality: N/A;  . COLONOSCOPY    . EYE SURGERY Bilateral    cataract surgery with lens implant  . INNER EAR SURGERY Left    x 3  . LAPAROSCOPIC SALPINGO OOPHERECTOMY    . NM MYOVIEW LTD  11/30/2007   normal stress nuclear study/EF-83%  . TONSILLECTOMY    . TOTAL HIP ARTHROPLASTY Right 06/26/2015    Procedure: RIGHT TOTAL HIP ARTHROPLASTY ANTERIOR APPROACH;  Surgeon: Leandrew Koyanagi, MD;  Location: [facility];  Service: Orthopedics;  Laterality: Right;  . TRANSTHORACIC ECHOCARDIOGRAM  07/23/2009   mild concentric LVH/mild mitral insufficiency/ moderate tricuspid innsufficiency/mild pulmonary HTN/EF- 55-60%  . VAGINAL DELIVERY      Current Outpatient Prescriptions  Medication Sig Dispense Refill  . acetaminophen (TYLENOL) 650 MG CR tablet Take 650 mg by mouth 2 (two) times daily as needed (arthritis pain).     [name] amLODipine (NORVASC) 5 MG tablet Take 5 mg by mouth daily.    [name] apixaban (ELIQUIS) 5 MG TABS tablet Take 1 tablet (5 mg total) by mouth 2 (two) times daily. 60 tablet 0  . atorvastatin (LIPITOR) 40 MG tablet Take 40 mg by mouth daily.     . Blood Glucose Monitoring Suppl (ONE TOUCH ULTRA 2) W/DEVICE KIT Reported on 07/11/2015    . Calcium Carbonate-Vitamin D3 (CALCIUM 600+D3) 600-400 MG-UNIT TABS Take 1 tablet by mouth daily.    . cholecalciferol (VITAMIN D) 1000 UNITS tablet Take 1,000 Units by mouth daily.     [name] desipramine (NORPRAMIN) 25 MG tablet Take 25 mg by mouth daily.    . diphenhydrAMINE (BENADRYL) 25 mg capsule Take 1 capsule (25 mg total) by mouth every 6 (six) hours as needed for itching or allergies (sore throat). 30 capsule 0  . Glucosamine HCl (GLUCOSAMINE PO) Take 1 tablet by mouth daily.    [name] glucosamine-chondroitin 500-400 MG tablet Take 1 tablet by mouth every other day.     [name] glucose blood (ACCU-CHEK ACTIVE STRIPS) test strip 1 each by Other route as needed for other. Reported on 07/11/2015    . linaclotide (LINZESS) 145 MCG CAPS capsule Take 145 mcg by mouth daily before breakfast.     . LORazepam (ATIVAN) 0.5 MG tablet Take 0.5 mg by mouth at bedtime.   5  . metoprolol succinate (TOPROL-XL) 25 MG 24 hr tablet Take 1 tablet (25 mg total) by mouth every morning. 30 tablet 0  . metoprolol succinate (TOPROL-XL) 25 MG 24 hr tablet Take 0.5 tablets (12.5 mg total) by mouth  every evening. 60 tablet 0  . omeprazole (PRILOSEC) 20 MG capsule Take 20 mg by mouth daily.     Glory Rosebush DELICA LANCETS 45W MISC Reported on 07/11/2015    . Polyethyl Glycol-Propyl Glycol (SYSTANE) 0.4-0.3 % GEL ophthalmic gel Place 1 application into both eyes at bedtime as needed (dry eyes).    . polyethylene glycol (MIRALAX / GLYCOLAX) packet Take 17 g by mouth daily. Mix in 8 oz liquid and drink    . PRESCRIPTION MEDICATION Inhale into the lungs at bedtime. Uses CPAP with oxygen    . torsemide (DEMADEX) 10 MG tablet Take 1 tablet (10 mg total) by mouth daily. 30 tablet 0  . vitamin B-12 (CYANOCOBALAMIN) 1000 MCG tablet Take 1,000 mcg by mouth daily.     No current facility-administered medications for this  encounter.     Allergies  Allergen Reactions  . Lasix [Furosemide] Itching  . Phenobarbital Itching  . Amitiza [Lubiprostone] Itching    Social History   Social History  . Marital status: Married    Spouse name: N/A  . Number of children: 1  . Years of education: 23   Occupational History  . Retired    Social History Main Topics  . Smoking status: Never Smoker  . Smokeless tobacco: Never Used  . Alcohol use No     Comment: rare  . Drug use: No  . Sexual activity: Not on file   Other Topics Concern  . Not on file   Social History Narrative   Patient lives alone.    Patient is widowed.    Patient retired.    Patient some college.    Patient has one child.    Caffeine 1-2 cups daily avg.    Family History  Problem Relation Age of Onset  . Heart attack Father   . Heart failure Mother   . Cancer Mother     colon,kidney  . Diabetes Sister   . Hypertension Sister   . Heart failure Sister     ROS- All systems are reviewed and negative except as per the HPI above  Physical Exam: Vitals:   03/17/16 1140  BP: 106/68  Pulse: 97  Weight: 169 lb (76.7 kg)  Height: 5' (1.524 m)   Wt Readings from Last 3 Encounters:  03/17/16 169 lb (76.7 kg)  03/11/16  164 lb (74.4 kg)  03/02/16 164 lb 9.6 oz (74.7 kg)    Labs: Lab Results  Component Value Date   NA 138 03/11/2016   K 4.2 03/11/2016   CL 102 03/11/2016   CO2 28 03/11/2016   GLUCOSE 95 03/11/2016   BUN 18 03/11/2016   CREATININE 1.02 (H) 03/11/2016   CALCIUM 9.5 03/11/2016   MG 2.0 03/02/2016   Lab Results  Component Value Date   INR 1.1 10/29/2015   No results found for: CHOL, HDL, LDLCALC, TRIG   GEN- The patient is well appearing, alert and oriented x 3 today.   Head- normocephalic, atraumatic Eyes-  Sclera clear, conjunctiva pink Ears- hearing intact Oropharynx- clear Neck- supple, no JVP Lymph- no cervical lymphadenopathy Lungs- Clear to ausculation bilaterally, normal work of breathing Heart- irregular rate and rhythm, no murmurs, rubs or gallops, PMI not laterally displaced GI- soft, NT, ND, + BS Extremities- no clubbing, cyanosis, or edema MS- no significant deformity or atrophy Skin- no rash or lesion Psych- euthymic mood, full affect Neuro- strength and sensation are intact  EKG- Echo-Study Conclusions  - Left ventricle: The cavity size was normal. Systolic function was   normal. The estimated ejection fraction was in the range of 60%   to 65%. Wall motion was normal; there were no regional wall   motion abnormalities. The study is not technically sufficient to   allow evaluation of LV diastolic function. - Aortic valve: Transvalvular velocity was within the normal range.   There was no stenosis. There was no regurgitation. - Mitral valve: Calcified annulus. Transvalvular velocity was   within the normal range. There was no evidence for stenosis.   There was moderate regurgitation directed centrally. - Left atrium: The atrium was severely dilated. - Right ventricle: The cavity size was normal. Wall thickness was   normal. Systolic function was normal. - Right atrium: The atrium was severely dilated. - Tricuspid valve: There was moderate  regurgitation. -  Pulmonic valve: There was moderate regurgitation. - Pulmonary arteries: Systolic pressure was severely increased. PA   peak pressure: 71 mm Hg (S). - Pericardium, extracardiac: A small pericardial effusion was   identified. Features were not consistent with tamponade   physiology.   Assessment and Plan: 1. Persistent afib Pt has had successful cardioversion's in recnt past but with ERAF With severely enlarged atrium, I doubt success of long term restoring SR Pt cannot afford tikoyn, it would a tier 4 drug for her Amiodarone best to avoid with her lung issues Currently, her breathing/fluid status is stable with rate controlled afib and she feels well I asked Dr. Martinique his opinion and he thinks it would be best to rate control her She will continue current toprol dose  Continue apixaban  F/u with Dr. Martinique 05/12/16   Geroge Baseman. Erlene Devita, [facility] [address] [place], [place] [address] [phone]

## 2016-04-03 DIAGNOSIS — G4733 Obstructive sleep apnea (adult) (pediatric): Secondary | ICD-10-CM | POA: Diagnosis not present

## 2016-04-03 DIAGNOSIS — R0902 Hypoxemia: Secondary | ICD-10-CM | POA: Diagnosis not present

## 2016-04-03 DIAGNOSIS — I5043 Acute on chronic combined systolic (congestive) and diastolic (congestive) heart failure: Secondary | ICD-10-CM | POA: Diagnosis not present

## 2016-04-03 DIAGNOSIS — I272 Pulmonary hypertension, unspecified: Secondary | ICD-10-CM | POA: Diagnosis not present

## 2016-04-07 DIAGNOSIS — K5900 Constipation, unspecified: Secondary | ICD-10-CM | POA: Diagnosis not present

## 2016-04-07 DIAGNOSIS — H3530 Unspecified macular degeneration: Secondary | ICD-10-CM | POA: Diagnosis not present

## 2016-04-07 DIAGNOSIS — E785 Hyperlipidemia, unspecified: Secondary | ICD-10-CM | POA: Diagnosis not present

## 2016-04-07 DIAGNOSIS — H9111 Presbycusis, right ear: Secondary | ICD-10-CM | POA: Diagnosis not present

## 2016-04-07 DIAGNOSIS — R69 Illness, unspecified: Secondary | ICD-10-CM | POA: Diagnosis not present

## 2016-04-07 DIAGNOSIS — I10 Essential (primary) hypertension: Secondary | ICD-10-CM | POA: Diagnosis not present

## 2016-04-07 DIAGNOSIS — M62838 Other muscle spasm: Secondary | ICD-10-CM | POA: Diagnosis not present

## 2016-04-07 DIAGNOSIS — E669 Obesity, unspecified: Secondary | ICD-10-CM | POA: Diagnosis not present

## 2016-04-07 DIAGNOSIS — L509 Urticaria, unspecified: Secondary | ICD-10-CM | POA: Diagnosis not present

## 2016-04-07 DIAGNOSIS — R233 Spontaneous ecchymoses: Secondary | ICD-10-CM | POA: Diagnosis not present

## 2016-04-07 DIAGNOSIS — M159 Polyosteoarthritis, unspecified: Secondary | ICD-10-CM | POA: Diagnosis not present

## 2016-04-07 DIAGNOSIS — M545 Low back pain: Secondary | ICD-10-CM | POA: Diagnosis not present

## 2016-04-07 DIAGNOSIS — Z0000 Encounter for general adult medical examination without abnormal findings: Secondary | ICD-10-CM | POA: Diagnosis not present

## 2016-04-07 DIAGNOSIS — R002 Palpitations: Secondary | ICD-10-CM | POA: Diagnosis not present

## 2016-04-07 DIAGNOSIS — M7592 Shoulder lesion, unspecified, left shoulder: Secondary | ICD-10-CM | POA: Diagnosis not present

## 2016-04-07 DIAGNOSIS — H918X2 Other specified hearing loss, left ear: Secondary | ICD-10-CM | POA: Diagnosis not present

## 2016-04-07 DIAGNOSIS — G4730 Sleep apnea, unspecified: Secondary | ICD-10-CM | POA: Diagnosis not present

## 2016-04-07 DIAGNOSIS — R0602 Shortness of breath: Secondary | ICD-10-CM | POA: Diagnosis not present

## 2016-04-13 DIAGNOSIS — G4733 Obstructive sleep apnea (adult) (pediatric): Secondary | ICD-10-CM | POA: Diagnosis not present

## 2016-04-20 DIAGNOSIS — R69 Illness, unspecified: Secondary | ICD-10-CM | POA: Diagnosis not present

## 2016-04-27 NOTE — Progress Notes (Signed)
Cardiology Office Note   Date:  04/28/2016   ID:  Melinda [name], DOB 18-Nov-1931, MRN [id number]  PCP:  Jerlyn Ly, MD  Cardiologist:   Emmanuel Ercole Martinique, MD   Chief Complaint  Patient presents with  . Atrial Fibrillation  . Congestive Heart Failure      History of Present Illness: Melinda [name] is a 83 y.o. female who presents for follow up of Afib and dyspnea.  She was seen by me in 2009. At that time she had a normal stress Myoview study. Echo showed mild LVH with normal EF. Mild pulmonary HTN.   She was seen in early May 2017 with symptoms of increased dyspnea on exertion. No cough, wheezing, edema. Weight has been stable.  On one occasion she walked to her mailbox and developed a heavy weight on her chest. She is s/p a right THR. She does have a history of OSA and is followed by Dr. Brett Fairy and using CPAP. She does stay fatigued a lot.   After my visit in early May 2017she was scheduled for a repeat Echo and a Myoview study. Before these could be done she was admitted May 7-9 with acute chest pain. She was found to be in Atrial flutter with controlled rate (new since April). She ruled out for MI. Echo showed normal LV function with mild LVH, mild MR, moderate LA, RA, and RV enlargement with pulmonary HTN estimated at 50 mm Hg. She underwent right and left heart cath that showed nonobstructive CAD, mild pulmonary HTN- more consistent with pulmonary arterial hypertension. CT of the chest was normal with no evidence of PE. She had subsequent PFTs as an outpatient showing normal volumes with mild obstruction and decreased diffusion capacity. She was started on diuretic therapy and anticoagulated. She underwent successful DCCV on August 29.   When seen again in November she had recurrent Afib. Beta blocker dose was increased and she had repeat DCCV on December 20. She was admitted 4 days later with hypoxia and acute respiratory distress. ? LLL PNA. Treated with antibiotics and  gently diuresed. Echo showed normal LV function. She had severe biatrial enlargement and pulmonary HTN. Seen back in ED on 03/11/16 with recurrent Afib. Rate controlled. CXR showed no infiltrates. CT chest without acute findings.  Seen for follow up in Afib clinic. It was felt that she was not a candidate for Tikosyn due to cost. Tier 4.  After discussion it was felt that it would be difficult to maintain NSR given her marked atrial enlargement and rate control strategy was recommended. She is still on eliquis.  On follow up today she states she is doing better. Breathing is improved. She states that after metoprolol dose was increased her HR dropped back down and she felt much better. She was fitted with a new CPAP mask and is using oxygen at night.  Edema is better. No bleeding problems. She states that when in Afib she felt more wiped out.  Past Medical History:  Diagnosis Date  . Acid reflux   . Anxiety   . Arthritis   . Cataract    surgery,bilateral  . Constipation   . Depression   . Diabetes mellitus without complication ([place])   . Dyspnea on exertion    a. 07/2015 Echo: EF 55-60%, no rwma, mild MR, mod dil LA/RA/RV, mod TR, triv PR, PASP 59mHg.  . Family history of adverse reaction to anesthesia    mother always had n/v  . Hx: UTI (urinary  tract infection)   . Hypercholesterolemia   . Hypertensive heart disease   . Insomnia   . Multifactorial gait disorder   . Non-obstructive CAD    a. 07/2015 Cath: LM 30-40d, LAD min irregs, LCX min irregs, RCA 30p, RA 17, RV 33/7, PA 42/19, PCWP 17.  . Osteoarthritis    a. 06/2015 s/p R total hip arthroplasty.  [name] PAF (paroxysmal atrial fibrillation) ([place])    a. Dx 07/2015, CHA2DS2VASc = 6-->Eliquis.  . Premature atrial contractions   . Pulmonary hypertension    a. 07/2015 Echo: PASP 17mHg.  .Marland KitchenPyloric antral stenosis    in childhood,infancy  . Sleep apnea with use of continuous positive airway pressure (CPAP) 10/19/2012   a. compliant w/ CPAP.     Past Surgical History:  Procedure Laterality Date  . ABDOMINAL HYSTERECTOMY  1970  . ABDOMINAL SURGERY     as a 982day old baby  . CARDIAC CATHETERIZATION N/A 07/14/2015   Procedure: Right/Left Heart Cath and Coronary Angiography;  Surgeon: MSanda Klein MD;  [facility];  Service: Cardiovascular;  Laterality: N/A;  . CARDIOVERSION N/A 11/04/2015   Procedure: CARDIOVERSION;  Surgeon: BLelon Perla MD;  [facility];  Service: Cardiovascular;  Laterality: N/A;  . CARDIOVERSION N/A 02/25/2016   Procedure: CARDIOVERSION;  Surgeon: KPixie Casino MD;  [facility];  Service: Cardiovascular;  Laterality: N/A;  . COLONOSCOPY    . EYE SURGERY Bilateral    cataract surgery with lens implant  . INNER EAR SURGERY Left    x 3  . LAPAROSCOPIC SALPINGO OOPHERECTOMY    . NM MYOVIEW LTD  11/30/2007   normal stress nuclear study/EF-83%  . TONSILLECTOMY    . TOTAL HIP ARTHROPLASTY Right 06/26/2015   Procedure: RIGHT TOTAL HIP ARTHROPLASTY ANTERIOR APPROACH;  Surgeon: NLeandrew Koyanagi MD;  [facility]  Service: Orthopedics;  Laterality: Right;  . TRANSTHORACIC ECHOCARDIOGRAM  07/23/2009   mild concentric LVH/mild mitral insufficiency/ moderate tricuspid innsufficiency/mild pulmonary HTN/EF- 55-60%  . VAGINAL DELIVERY       Current Outpatient Prescriptions  Medication Sig Dispense Refill  . acetaminophen (TYLENOL) 650 MG CR tablet Take 650 mg by mouth 2 (two) times daily as needed (arthritis pain).     .Marland KitchenamLODipine (NORVASC) 5 MG tablet Take 5 mg by mouth daily.    .Marland Kitchenapixaban (ELIQUIS) 5 MG TABS tablet Take 1 tablet (5 mg total) by mouth 2 (two) times daily. 60 tablet 0  . atorvastatin (LIPITOR) 40 MG tablet Take 40 mg by mouth daily.     . Blood Glucose Monitoring Suppl (ONE TOUCH ULTRA 2) W/DEVICE KIT Reported on 07/11/2015    . Calcium Carbonate-Vitamin D3 (CALCIUM 600+D3) 600-400 MG-UNIT TABS Take 1 tablet by mouth daily.    . cholecalciferol (VITAMIN  D) 1000 UNITS tablet Take 1,000 Units by mouth daily.     .Marland Kitchendesipramine (NORPRAMIN) 25 MG tablet Take 25 mg by mouth daily.    . diphenhydrAMINE (BENADRYL) 25 mg capsule Take 1 capsule (25 mg total) by mouth every 6 (six) hours as needed for itching or allergies (sore throat). 30 capsule 0  . famotidine (PEPCID) 20 MG tablet Take 20 mg by mouth daily.    . Glucosamine HCl (GLUCOSAMINE PO) Take 1 tablet by mouth daily.    .Marland Kitchenglucosamine-chondroitin 500-400 MG tablet Take 1 tablet by mouth every other day.     .Marland Kitchenglucose blood (ACCU-CHEK ACTIVE STRIPS) test strip 1 each by Other route as  needed for other. Reported on 07/11/2015    . linaclotide (LINZESS) 145 MCG CAPS capsule Take 145 mcg by mouth daily before breakfast.     . LORazepam (ATIVAN) 0.5 MG tablet Take 0.5 mg by mouth at bedtime.   5  . metoprolol succinate (TOPROL-XL) 25 MG 24 hr tablet Take 1 tablet (25 mg total) by mouth every morning. 30 tablet 0  . metoprolol succinate (TOPROL-XL) 25 MG 24 hr tablet Take 0.5 tablets (12.5 mg total) by mouth every evening. 60 tablet 0  . ONETOUCH DELICA LANCETS 29J MISC Reported on 07/11/2015    . Polyethyl Glycol-Propyl Glycol (SYSTANE) 0.4-0.3 % GEL ophthalmic gel Place 1 application into both eyes at bedtime as needed (dry eyes).    . polyethylene glycol (MIRALAX / GLYCOLAX) packet Take 17 g by mouth daily. Mix in 8 oz liquid and drink    . PRESCRIPTION MEDICATION Inhale into the lungs at bedtime. Uses CPAP with oxygen    . torsemide (DEMADEX) 10 MG tablet Take 1 tablet (10 mg total) by mouth daily. 30 tablet 0  . vitamin B-12 (CYANOCOBALAMIN) 1000 MCG tablet Take 1,000 mcg by mouth daily.     No current facility-administered medications for this visit.     Allergies:   Lasix [furosemide]; Phenobarbital; and Amitiza [lubiprostone]    Social History:  The patient  reports that she has never smoked. She has never used smokeless tobacco. She reports that she does not drink alcohol or use drugs.    Family History:  The patient's family history includes Cancer in her mother; Diabetes in her sister; Heart attack in her father; Heart failure in her mother and sister; Hypertension in her sister.    ROS:  Please see the history of present illness.   Otherwise, review of systems are positive for none.   All other systems are reviewed and negative.    PHYSICAL EXAM: VS:  BP 140/62   Pulse 62   Ht 5' (1.524 m)   Wt 165 lb (74.8 kg)   BMI 32.22 kg/m  , BMI Body mass index is 32.22 kg/m. GEN: Well nourished, well developed, in no acute distress she is seen in a wheelchair. HEENT: normal  Neck: no JVD, carotid bruits, or masses Cardiac: RRR; no murmurs, rubs, or gallops. No edema. Respiratory:  clear to auscultation bilaterally, normal work of breathing GI: soft, nontender, nondistended, + BS MS: no deformity or atrophy  Skin: warm and dry, no rash Ext: trace edema Neuro:  Strength and sensation are intact Psych: euthymic mood, full affect   EKG:  EKG is not ordered today.   Recent Labs: 02/29/2016: B Natriuretic Peptide 948.9 03/02/2016: Magnesium 2.0 03/11/2016: ALT 49; BUN 18; Creatinine, Ser 1.02; Hemoglobin 13.2; Platelets 224; Potassium 4.2; Sodium 138 03/17/2016: TSH 3.780    Lipid Panel No results found for: CHOL, TRIG, HDL, CHOLHDL, VLDL, LDLCALC, LDLDIRECT    Wt Readings from Last 3 Encounters:  04/28/16 165 lb (74.8 kg)  03/17/16 169 lb (76.7 kg)  03/11/16 164 lb (74.4 kg)      Other studies Reviewed: Echo 03/02/16: Study Conclusions  - Left ventricle: The cavity size was normal. Systolic function was   normal. The estimated ejection fraction was in the range of 60%   to 65%. Wall motion was normal; there were no regional wall   motion abnormalities. The study is not technically sufficient to   allow evaluation of LV diastolic function. - Aortic valve: Transvalvular velocity was within the normal range.  There was no stenosis. There was no  regurgitation. - Mitral valve: Calcified annulus. Transvalvular velocity was   within the normal range. There was no evidence for stenosis.   There was moderate regurgitation directed centrally. - Left atrium: The atrium was severely dilated. - Right ventricle: The cavity size was normal. Wall thickness was   normal. Systolic function was normal. - Right atrium: The atrium was severely dilated. - Tricuspid valve: There was moderate regurgitation. - Pulmonic valve: There was moderate regurgitation. - Pulmonary arteries: Systolic pressure was severely increased. PA   peak pressure: 71 mm Hg (S). - Pericardium, extracardiac: A small pericardial effusion was   identified. Features were not consistent with tamponade   physiology.  ASSESSMENT AND PLAN:  1. Atrial fibrillation paroxysmal. S/p DCCV x 2 with early return of Afib. Now appears on exam to have converted spontaneously to NSR.  Now on Eliquis for anticoagulation. on metoprolol for  rate control strategy. Clearly she is symptomatic with her Afib and if it recurs we will need to reconsider options for rhythm control. Tikosyn would seem to be the best option but may not be affordable. Amiodarone is not very attractive given pulmonary issues and reduced diffusion capacity. ? If she would be a candidate for Sotalol or Multaq. Probably not a great candidate for ablation given marked atrial enlargement but I think if Afib recurs we will need to get EP to weigh in on this. I will see her back in 2 months.   2. Atypical chest pain. Nonobstructive CAD by cath.   3. OSA on CPAP  4. Pulmonary HTN  5. HTN  6. Hyperlipidemia.    Current medicines are reviewed at length with the patient today.  The patient does not have concerns regarding medicines.  The following changes have been made:  no change  Labs/ tests ordered today include:  No orders of the defined types were placed in this encounter.  Follow up in 2 months.  Signed, Melinda Charles  Martinique, MD  04/28/2016 12:40 PM    [facility] [address] [place], [name], [address] Phone [phone], Fax [phone]

## 2016-04-28 ENCOUNTER — Ambulatory Visit (INDEPENDENT_AMBULATORY_CARE_PROVIDER_SITE_OTHER): Payer: Medicare HMO | Admitting: Cardiology

## 2016-04-28 ENCOUNTER — Encounter: Payer: Self-pay | Admitting: Cardiology

## 2016-04-28 VITALS — BP 140/62 | HR 62 | Ht 60.0 in | Wt 165.0 lb

## 2016-04-28 DIAGNOSIS — I10 Essential (primary) hypertension: Secondary | ICD-10-CM | POA: Diagnosis not present

## 2016-04-28 DIAGNOSIS — I480 Paroxysmal atrial fibrillation: Secondary | ICD-10-CM | POA: Diagnosis not present

## 2016-04-28 DIAGNOSIS — Z7901 Long term (current) use of anticoagulants: Secondary | ICD-10-CM | POA: Diagnosis not present

## 2016-04-28 DIAGNOSIS — I119 Hypertensive heart disease without heart failure: Secondary | ICD-10-CM | POA: Diagnosis not present

## 2016-04-28 DIAGNOSIS — I2510 Atherosclerotic heart disease of native coronary artery without angina pectoris: Secondary | ICD-10-CM

## 2016-04-28 NOTE — Patient Instructions (Signed)
Continue your current therapy  I will see you in 2 months.   

## 2016-04-30 ENCOUNTER — Telehealth (HOSPITAL_COMMUNITY): Payer: Self-pay | Admitting: *Deleted

## 2016-04-30 NOTE — Telephone Encounter (Signed)
lmom for pt to call and schedule appt with afib clinic to discuss antiarrhythmic.

## 2016-05-04 DIAGNOSIS — G4733 Obstructive sleep apnea (adult) (pediatric): Secondary | ICD-10-CM | POA: Diagnosis not present

## 2016-05-04 DIAGNOSIS — I5043 Acute on chronic combined systolic (congestive) and diastolic (congestive) heart failure: Secondary | ICD-10-CM | POA: Diagnosis not present

## 2016-05-04 DIAGNOSIS — R0902 Hypoxemia: Secondary | ICD-10-CM | POA: Diagnosis not present

## 2016-05-04 DIAGNOSIS — I272 Pulmonary hypertension, unspecified: Secondary | ICD-10-CM | POA: Diagnosis not present

## 2016-05-06 ENCOUNTER — Emergency Department (HOSPITAL_COMMUNITY)
Admission: EM | Admit: 2016-05-06 | Discharge: 2016-05-07 | Disposition: A | Discharge status: Home / Self Care | Payer: Medicare HMO | Attending: Emergency Medicine | Admitting: Emergency Medicine

## 2016-05-06 DIAGNOSIS — Z7901 Long term (current) use of anticoagulants: Secondary | ICD-10-CM | POA: Diagnosis not present

## 2016-05-06 DIAGNOSIS — Z23 Encounter for immunization: Secondary | ICD-10-CM | POA: Diagnosis not present

## 2016-05-06 DIAGNOSIS — I2510 Atherosclerotic heart disease of native coronary artery without angina pectoris: Secondary | ICD-10-CM | POA: Insufficient documentation

## 2016-05-06 DIAGNOSIS — S51811A Laceration without foreign body of right forearm, initial encounter: Secondary | ICD-10-CM | POA: Diagnosis present

## 2016-05-06 DIAGNOSIS — S5002XA Contusion of left elbow, initial encounter: Secondary | ICD-10-CM | POA: Diagnosis not present

## 2016-05-06 DIAGNOSIS — E119 Type 2 diabetes mellitus without complications: Secondary | ICD-10-CM | POA: Diagnosis not present

## 2016-05-06 DIAGNOSIS — Y929 Unspecified place or not applicable: Secondary | ICD-10-CM | POA: Diagnosis not present

## 2016-05-06 DIAGNOSIS — M25522 Pain in left elbow: Secondary | ICD-10-CM | POA: Diagnosis not present

## 2016-05-06 DIAGNOSIS — S0990XA Unspecified injury of head, initial encounter: Secondary | ICD-10-CM | POA: Diagnosis not present

## 2016-05-06 DIAGNOSIS — S51011A Laceration without foreign body of right elbow, initial encounter: Secondary | ICD-10-CM | POA: Diagnosis not present

## 2016-05-06 DIAGNOSIS — Y999 Unspecified external cause status: Secondary | ICD-10-CM | POA: Diagnosis not present

## 2016-05-06 DIAGNOSIS — W01198A Fall on same level from slipping, tripping and stumbling with subsequent striking against other object, initial encounter: Secondary | ICD-10-CM | POA: Diagnosis not present

## 2016-05-06 DIAGNOSIS — S098XXA Other specified injuries of head, initial encounter: Secondary | ICD-10-CM | POA: Diagnosis not present

## 2016-05-06 DIAGNOSIS — Y939 Activity, unspecified: Secondary | ICD-10-CM | POA: Insufficient documentation

## 2016-05-06 DIAGNOSIS — S0003XA Contusion of scalp, initial encounter: Secondary | ICD-10-CM | POA: Diagnosis not present

## 2016-05-06 DIAGNOSIS — S41111A Laceration without foreign body of right upper arm, initial encounter: Secondary | ICD-10-CM

## 2016-05-06 DIAGNOSIS — S59902A Unspecified injury of left elbow, initial encounter: Secondary | ICD-10-CM | POA: Diagnosis not present

## 2016-05-06 DIAGNOSIS — G4489 Other headache syndrome: Secondary | ICD-10-CM | POA: Diagnosis not present

## 2016-05-06 DIAGNOSIS — S199XXA Unspecified injury of neck, initial encounter: Secondary | ICD-10-CM | POA: Diagnosis not present

## 2016-05-06 NOTE — ED Triage Notes (Signed)
Per EMS, pt from home with c/o fall tonight at 2220. Pt states her feet got "tangled" while getting up and hit her head on base of the couch. Pt has hematoma to head, denies LOC. Pt takes eliquis daily. A&Ox 4. BP-156/72, HR-72, RR-20, SpO2-96% ra, CBG-218

## 2016-05-07 ENCOUNTER — Emergency Department (HOSPITAL_COMMUNITY): Payer: Medicare HMO

## 2016-05-07 ENCOUNTER — Encounter (HOSPITAL_COMMUNITY): Payer: Self-pay | Admitting: Emergency Medicine

## 2016-05-07 DIAGNOSIS — W01198A Fall on same level from slipping, tripping and stumbling with subsequent striking against other object, initial encounter: Secondary | ICD-10-CM | POA: Diagnosis not present

## 2016-05-07 DIAGNOSIS — Z23 Encounter for immunization: Secondary | ICD-10-CM | POA: Diagnosis not present

## 2016-05-07 DIAGNOSIS — S51811A Laceration without foreign body of right forearm, initial encounter: Secondary | ICD-10-CM | POA: Diagnosis not present

## 2016-05-07 DIAGNOSIS — I2510 Atherosclerotic heart disease of native coronary artery without angina pectoris: Secondary | ICD-10-CM | POA: Diagnosis not present

## 2016-05-07 DIAGNOSIS — S0003XA Contusion of scalp, initial encounter: Secondary | ICD-10-CM | POA: Diagnosis not present

## 2016-05-07 DIAGNOSIS — S0990XA Unspecified injury of head, initial encounter: Secondary | ICD-10-CM | POA: Diagnosis not present

## 2016-05-07 DIAGNOSIS — S5002XA Contusion of left elbow, initial encounter: Secondary | ICD-10-CM | POA: Diagnosis not present

## 2016-05-07 DIAGNOSIS — E119 Type 2 diabetes mellitus without complications: Secondary | ICD-10-CM | POA: Diagnosis not present

## 2016-05-07 DIAGNOSIS — S199XXA Unspecified injury of neck, initial encounter: Secondary | ICD-10-CM | POA: Diagnosis not present

## 2016-05-07 DIAGNOSIS — S59902A Unspecified injury of left elbow, initial encounter: Secondary | ICD-10-CM | POA: Diagnosis not present

## 2016-05-07 DIAGNOSIS — Z7901 Long term (current) use of anticoagulants: Secondary | ICD-10-CM | POA: Diagnosis not present

## 2016-05-07 DIAGNOSIS — M25522 Pain in left elbow: Secondary | ICD-10-CM | POA: Diagnosis not present

## 2016-05-07 MED ORDER — ACETAMINOPHEN 500 MG PO TABS
1000.0000 mg | ORAL_TABLET | Freq: Once | ORAL | Status: AC
  Administered 2016-05-07: 1000 mg via ORAL
  Filled 2016-05-07: qty 2

## 2016-05-07 MED ORDER — BACITRACIN ZINC 500 UNIT/GM EX OINT
TOPICAL_OINTMENT | Freq: Once | CUTANEOUS | Status: AC
  Administered 2016-05-07: 02:00:00 via TOPICAL

## 2016-05-07 MED ORDER — TETANUS-DIPHTH-ACELL PERTUSSIS 5-2.5-18.5 LF-MCG/0.5 IM SUSP
0.5000 mL | Freq: Once | INTRAMUSCULAR | Status: AC
  Administered 2016-05-07: 0.5 mL via INTRAMUSCULAR
  Filled 2016-05-07: qty 0.5

## 2016-05-07 NOTE — Discharge Instructions (Signed)
You may take Tylenol 1000 mg every 6 hours as needed for pain. °

## 2016-05-07 NOTE — ED Notes (Signed)
Pt departed in NAD.  

## 2016-05-07 NOTE — ED Notes (Signed)
Patient transported to X-ray 

## 2016-05-07 NOTE — ED Notes (Signed)
Bacitracin applied to R forearm skin tear and covered with non-adherent bandage. Secured with Kerlix wrap.

## 2016-05-07 NOTE — ED Provider Notes (Signed)
By signing my name below, I, Hansel Feinstein, attest that this documentation has been prepared under the direction and in the presence of [place], DO. Electronically Signed: Hansel Feinstein, ED Scribe. 05/07/16. 12:24 AM.   TIME SEEN: 12:15 AM   CHIEF COMPLAINT:  Chief Complaint  Patient presents with  . Fall     HPI:  HPI Comments: Melinda [name] is a 83 y.o. female with h/o PAF on Eliquis who presents to the Emergency Department complaining of a moderate posterior HA s/p fall that occurred last night. Pt states she believes she tripped and fell backward, striking her posterior head on the couch. She denies LOC. Pt also denies lightheadedness, CP or SOB prior to falling. Pt also notes bruising/swelling to the left elbow and a skin tear to the right forearm as a result of the fall. No worsening or alleviating factors noted. Tdap unknown. She denies back pain, arm pain, abdominal pain, numbness, additional injuries.   ROS: See HPI Constitutional: no fever  Eyes: no drainage  ENT: no runny nose   Cardiovascular:  no chest pain  Resp: no SOB  GI: no vomiting GU: no dysuria Integumentary: no rash. +skin tear Allergy: no hives  Musculoskeletal: no leg swelling. +ecchymosis and swelling to the left elbow   Neurological: no slurred speech. +HA ROS otherwise negative  PAST MEDICAL HISTORY/PAST SURGICAL HISTORY:  Past Medical History:  Diagnosis Date  . Acid reflux   . Anxiety   . Arthritis   . Cataract    surgery,bilateral  . Constipation   . Depression   . Diabetes mellitus without complication ([place])   . Dyspnea on exertion    a. 07/2015 Echo: EF 55-60%, no rwma, mild MR, mod dil LA/RA/RV, mod TR, triv PR, PASP 42mHg.  . Family history of adverse reaction to anesthesia    mother always had n/v  . Hx: UTI (urinary tract infection)   . Hypercholesterolemia   . Hypertensive heart disease   . Insomnia   . Multifactorial gait disorder   . Non-obstructive CAD    a. 07/2015  Cath: LM 30-40d, LAD min irregs, LCX min irregs, RCA 30p, RA 17, RV 33/7, PA 42/19, PCWP 17.  . Osteoarthritis    a. 06/2015 s/p R total hip arthroplasty.  .Marland KitchenPAF (paroxysmal atrial fibrillation) ([place]    a. Dx 07/2015, CHA2DS2VASc = 6-->Eliquis.  . Premature atrial contractions   . Pulmonary hypertension    a. 07/2015 Echo: PASP 573mg.  . Marland Kitchenyloric antral stenosis    in childhood,infancy  . Sleep apnea with use of continuous positive airway pressure (CPAP) 10/19/2012   a. compliant w/ CPAP.    MEDICATIONS:  Prior to Admission medications   Medication Sig Start Date End Date Taking? Authorizing Provider  acetaminophen (TYLENOL) 650 MG CR tablet Take 650 mg by mouth 2 (two) times daily as needed (arthritis pain).     Historical Provider, MD  amLODipine (NORVASC) 5 MG tablet Take 5 mg by mouth daily.    Historical Provider, MD  apixaban (ELIQUIS) 5 MG TABS tablet Take 1 tablet (5 mg total) by mouth 2 (two) times daily. 07/15/15   AlRobbie LisMD  atorvastatin (LIPITOR) 40 MG tablet Take 40 mg by mouth daily.     Historical Provider, MD  Blood Glucose Monitoring Suppl (ONE TOUCH ULTRA 2) W/DEVICE KIT Reported on 07/11/2015 06/21/13   Historical Provider, MD  Calcium Carbonate-Vitamin D3 (CALCIUM 600+D3) 600-400 MG-UNIT TABS Take 1 tablet by mouth daily.  Historical Provider, MD  cholecalciferol (VITAMIN D) 1000 UNITS tablet Take 1,000 Units by mouth daily.     Historical Provider, MD  desipramine (NORPRAMIN) 25 MG tablet Take 25 mg by mouth daily.    Historical Provider, MD  diphenhydrAMINE (BENADRYL) 25 mg capsule Take 1 capsule (25 mg total) by mouth every 6 (six) hours as needed for itching or allergies (sore throat). 03/02/16   Lavina Hamman, MD  famotidine (PEPCID) 20 MG tablet Take 20 mg by mouth daily.    Historical Provider, MD  Glucosamine HCl (GLUCOSAMINE PO) Take 1 tablet by mouth daily.    Historical Provider, MD  glucosamine-chondroitin 500-400 MG tablet Take 1 tablet by mouth every  other day.     Historical Provider, MD  glucose blood (ACCU-CHEK ACTIVE STRIPS) test strip 1 each by Other route as needed for other. Reported on 07/11/2015    Historical Provider, MD  linaclotide (LINZESS) 145 MCG CAPS capsule Take 145 mcg by mouth daily before breakfast.     Historical Provider, MD  LORazepam (ATIVAN) 0.5 MG tablet Take 0.5 mg by mouth at bedtime.  12/26/15   Historical Provider, MD  metoprolol succinate (TOPROL-XL) 25 MG 24 hr tablet Take 1 tablet (25 mg total) by mouth every morning. 03/11/16   Forde Dandy, MD  metoprolol succinate (TOPROL-XL) 25 MG 24 hr tablet Take 0.5 tablets (12.5 mg total) by mouth every evening. 03/11/16   Forde Dandy, MD  [facility] DELICA LANCETS 67H MISC Reported on 07/11/2015 06/21/13   Historical Provider, MD  Polyethyl Glycol-Propyl Glycol (SYSTANE) 0.4-0.3 % GEL ophthalmic gel Place 1 application into both eyes at bedtime as needed (dry eyes).    Historical Provider, MD  polyethylene glycol (MIRALAX / GLYCOLAX) packet Take 17 g by mouth daily. Mix in 8 oz liquid and drink    Historical Provider, MD  Annetta North into the lungs at bedtime. Uses CPAP with oxygen    Historical Provider, MD  torsemide (DEMADEX) 10 MG tablet Take 1 tablet (10 mg total) by mouth daily. 03/03/16   Lavina Hamman, MD  vitamin B-12 (CYANOCOBALAMIN) 1000 MCG tablet Take 1,000 mcg by mouth daily.    Historical Provider, MD    ALLERGIES:  Allergies  Allergen Reactions  . Lasix [Furosemide] Itching  . Phenobarbital Itching  . Amitiza [Lubiprostone] Itching    SOCIAL HISTORY:  Social History  Substance Use Topics  . Smoking status: Never Smoker  . Smokeless tobacco: Never Used  . Alcohol use No     Comment: rare    FAMILY HISTORY: Family History  Problem Relation Age of Onset  . Heart attack Father   . Heart failure Mother   . Cancer Mother     colon,kidney  . Diabetes Sister   . Hypertension Sister   . Heart failure Sister     EXAM: BP 139/60  (BP Location: Right Arm)   Pulse 65   Temp 98.4 F (36.9 C) (Oral)   Resp 14   Ht 5' (1.524 m)   Wt 165 lb (74.8 kg)   SpO2 96%   BMI 32.22 kg/m  CONSTITUTIONAL: Alert and oriented and responds appropriately to questions. Well-appearing; well-nourished; GCS 15. Elderly.  HEAD: Normocephalic. Posterior scalp hematoma. EYES: Conjunctivae clear, PERRL, EOMI ENT: normal nose; no rhinorrhea; moist mucous membranes; pharynx without lesions noted; no dental injury; no septal hematoma,.   NECK: Supple, no meningismus, no LAD; no midline spinal tenderness, step-off or deformity; trachea midline CARD: RRR; S1  and S2 appreciated; no murmurs, no clicks, no rubs, no gallops RESP: Normal chest excursion without splinting or tachypnea; breath sounds clear and equal bilaterally; no wheezes, no rhonchi, no rales; no hypoxia or respiratory distress CHEST:  chest wall stable, no crepitus or ecchymosis or deformity, nontender to palpation; no flail chest ABD/GI: Normal bowel sounds; non-distended; soft, non-tender, no rebound, no guarding; no ecchymosis or other lesions noted PELVIS:  stable, nontender to palpation BACK:  The back appears normal and is non-tender to palpation, there is no CVA tenderness; no midline spinal tenderness, step-off or deformity EXT: Normal ROM in all joints; non-tender to palpation; no edema; normal capillary refill; no cyanosis, no bony tenderness or bony deformity of patient's extremities, no joint effusion, compartments are soft, extremities are warm and well-perfused. Swelling and ecchymosis to the left elbow, but FROM in that joint SKIN: Normal color for age and race; warm. 2 cm skin tear to the lateral proximal right forearm.  NEURO: Moves all extremities equally, sensation to light touch intact diffusely, cranial nerves II through XII intact PSYCH: The patient's mood and manner are appropriate. Grooming and personal hygiene are appropriate.  MEDICAL DECISION MAKING: Patient  here after a mechanical fall. Patient is on our request. We'll obtain CT of the head and cervical spine. Appears to have some swelling to the left elbow as well. Will obtain an x-ray of the she denies any significant pain in this area has full range of motion. Neurologically intact without complaints except for mild headache. Will give dose of Tylenol. Patient also has a skin tear to the right arm that we will clean and dress. Will update his tetanus vaccination.  ED PROGRESS: Patient's imaging shows no skull fracture or trauma hemorrhage. She does have a moderate scalp hematoma. No acute fracture or displacement of cervical spine that is acute. She does have some severe neural foraminal narrowing but no neurologic deficits today. X-ray of the left elbow shows no bony abnormality. She reports feeling better after Tylenol. I feel she is safe to be discharged with her friends at bedside. Discussed head injury return precautions. She is comfortable with this plan.   At this time, I do not feel there is any life-threatening condition present. I have reviewed and discussed all results (EKG, imaging, lab, urine as appropriate) and exam findings with patient/family. I have reviewed nursing notes and appropriate previous records.  I feel the patient is safe to be discharged home without further emergent workup and can continue workup as an outpatient as needed. Discussed usual and customary return precautions. Patient/family verbalize understanding and are comfortable with this plan.  Outpatient follow-up has been provided. All questions have been answered.       I personally performed the services described in this documentation, which was scribed in my presence. The recorded information has been reviewed and is accurate.    [place], DO 05/07/16 [phone]

## 2016-05-12 ENCOUNTER — Ambulatory Visit: Payer: Medicare HMO | Admitting: Cardiology

## 2016-05-13 ENCOUNTER — Encounter (HOSPITAL_COMMUNITY): Payer: Self-pay | Admitting: Nurse Practitioner

## 2016-05-13 ENCOUNTER — Ambulatory Visit (HOSPITAL_COMMUNITY)
Admission: RE | Admit: 2016-05-13 | Discharge: 2016-05-13 | Disposition: A | Discharge status: Home / Self Care | Payer: Medicare HMO | Source: Ambulatory Visit | Attending: Nurse Practitioner | Admitting: Nurse Practitioner

## 2016-05-13 VITALS — BP 140/70 | HR 72 | Ht 60.0 in | Wt 171.6 lb

## 2016-05-13 DIAGNOSIS — I272 Pulmonary hypertension, unspecified: Secondary | ICD-10-CM | POA: Diagnosis not present

## 2016-05-13 DIAGNOSIS — E119 Type 2 diabetes mellitus without complications: Secondary | ICD-10-CM | POA: Diagnosis not present

## 2016-05-13 DIAGNOSIS — Z9889 Other specified postprocedural states: Secondary | ICD-10-CM | POA: Insufficient documentation

## 2016-05-13 DIAGNOSIS — G4730 Sleep apnea, unspecified: Secondary | ICD-10-CM | POA: Insufficient documentation

## 2016-05-13 DIAGNOSIS — Z800 Family history of malignant neoplasm of digestive organs: Secondary | ICD-10-CM | POA: Diagnosis not present

## 2016-05-13 DIAGNOSIS — G4700 Insomnia, unspecified: Secondary | ICD-10-CM | POA: Insufficient documentation

## 2016-05-13 DIAGNOSIS — I119 Hypertensive heart disease without heart failure: Secondary | ICD-10-CM | POA: Insufficient documentation

## 2016-05-13 DIAGNOSIS — Z90722 Acquired absence of ovaries, bilateral: Secondary | ICD-10-CM | POA: Diagnosis not present

## 2016-05-13 DIAGNOSIS — I480 Paroxysmal atrial fibrillation: Secondary | ICD-10-CM | POA: Insufficient documentation

## 2016-05-13 DIAGNOSIS — Z888 Allergy status to other drugs, medicaments and biological substances status: Secondary | ICD-10-CM | POA: Insufficient documentation

## 2016-05-13 DIAGNOSIS — Z7902 Long term (current) use of antithrombotics/antiplatelets: Secondary | ICD-10-CM | POA: Diagnosis not present

## 2016-05-13 DIAGNOSIS — Z90710 Acquired absence of both cervix and uterus: Secondary | ICD-10-CM | POA: Diagnosis not present

## 2016-05-13 DIAGNOSIS — I2510 Atherosclerotic heart disease of native coronary artery without angina pectoris: Secondary | ICD-10-CM | POA: Insufficient documentation

## 2016-05-13 DIAGNOSIS — Z96641 Presence of right artificial hip joint: Secondary | ICD-10-CM | POA: Insufficient documentation

## 2016-05-13 DIAGNOSIS — Z8249 Family history of ischemic heart disease and other diseases of the circulatory system: Secondary | ICD-10-CM | POA: Diagnosis not present

## 2016-05-13 DIAGNOSIS — Z833 Family history of diabetes mellitus: Secondary | ICD-10-CM | POA: Insufficient documentation

## 2016-05-13 DIAGNOSIS — Z87440 Personal history of urinary (tract) infections: Secondary | ICD-10-CM | POA: Insufficient documentation

## 2016-05-13 DIAGNOSIS — I491 Atrial premature depolarization: Secondary | ICD-10-CM | POA: Diagnosis not present

## 2016-05-13 DIAGNOSIS — E7800 Pure hypercholesterolemia, unspecified: Secondary | ICD-10-CM | POA: Insufficient documentation

## 2016-05-13 DIAGNOSIS — M1990 Unspecified osteoarthritis, unspecified site: Secondary | ICD-10-CM | POA: Insufficient documentation

## 2016-05-13 DIAGNOSIS — I481 Persistent atrial fibrillation: Secondary | ICD-10-CM | POA: Insufficient documentation

## 2016-05-13 DIAGNOSIS — K219 Gastro-esophageal reflux disease without esophagitis: Secondary | ICD-10-CM | POA: Diagnosis not present

## 2016-05-13 LAB — BASIC METABOLIC PANEL
Anion gap: 6 (ref 5–15)
BUN: 16 mg/dL (ref 6–20)
CHLORIDE: 100 mmol/L — AB (ref 101–111)
CO2: 31 mmol/L (ref 22–32)
CREATININE: 0.82 mg/dL (ref 0.44–1.00)
Calcium: 9.7 mg/dL (ref 8.9–10.3)
GFR calc non Af Amer: >60 mL/min (ref >60)
Glucose, Bld: 156 mg/dL — ABNORMAL HIGH (ref 65–99)
Potassium: 3.6 mmol/L (ref 3.5–5.1)
Sodium: 137 mmol/L (ref 135–145)

## 2016-05-13 LAB — MAGNESIUM: Magnesium: 2.2 mg/dL (ref 1.7–2.4)

## 2016-05-13 NOTE — Patient Instructions (Signed)
Plan to be admitted on 3/26 for 3 day stay to be loaded on sotalol

## 2016-05-14 ENCOUNTER — Telehealth: Payer: Self-pay | Admitting: Pharmacist

## 2016-05-14 ENCOUNTER — Other Ambulatory Visit (HOSPITAL_COMMUNITY): Payer: Self-pay | Admitting: *Deleted

## 2016-05-14 DIAGNOSIS — R69 Illness, unspecified: Secondary | ICD-10-CM | POA: Diagnosis not present

## 2016-05-14 MED ORDER — POTASSIUM CHLORIDE ER 10 MEQ PO TBCR: 10.0000 meq | EXTENDED_RELEASE_TABLET | Freq: Every day | ORAL | 6 refills | Status: DC

## 2016-05-14 NOTE — Telephone Encounter (Signed)
Pt to be admitted for sotalol initiation in the coming weeks. Medication list reviewed for contraindicated medications or QTc prolonging medications. Pt has both Benadryl and desipramine on her med list, both of which can cause QTc prolongation. Would advise pt to d/c Benadryl and f/u with his PCP regarding desipramine. This is not an absolute contraindication and pt's QTc was <471msec on EKG from yesterday, but would still prefer other medication instead of a tricyclic antidepressant.   Pt is properly anticoagulated on Eliquis 5mg  BID for afib (wt > 60kg, SCr < 1.5). Will need to ensure she has not missed any doses of Eliquis in the 3 weeks leading up to sotalol initiation.

## 2016-05-14 NOTE — Progress Notes (Signed)
Primary Care Physician: Jerlyn Ly, MD Referring Physician: [name] f/u Cardiologist: Dr. [place]   Melinda [name] is a 83 y.o. female with a h/o HTN, HLD, CAD, DM type II, PAF on Eliquis; who presented to [facility] ER, 12/24, with a reportedseveral month history of intermittent shortness of breath that acutely worsened over the last few days. She previously noticed shortness of breath symptoms when significantly exerting herself such as walking upstairs or walking long distances. However,over the last few days prior to admission, she could not walk even 2-3 steps without having to pause to catch her breath. States that she's only on oxygen with her CPAP mask at night. Associated symptoms included fatigue, 2 pillow orthopnea, lower extremity swelling, wheezing,dry cough, and chills. She was diagnosed with pneumonia, with hypoxia and acute on chronic diastolic dysfunction with afib with RVR and  Patient just underwent cardioversion, 12/20, for atrial fibrillation 4 days ago. This was her second time having this procedure done, previous was in May of this year.She reports taking her Eliquis regularly.   She's had no other recent changes to her medication regimen.    She left the hospital 12/24 and was in [place]. She was in the [name] 1/4 and started having chest/neck/jaw pain. She was taken back to the ER and found to be back in afib with rvr.. She was discharged to home with f/u in the afib clinic. She is feeling at her baseline today and afib is controlled.  HR at home is controlled by reviewing pt's HR/BP log.. When afib first returned, her rates were higher. Echo shows severely enlarged rt/left atrium and with severe pulmonary HTN.   She returns to afib clinic 05/14/16 to further discuss antiarrythmic's. She stated when I saw her last, after antiarrythmic's were discussed in detail, that she was tolerating rate controlled afib and deferred AAD therapy. However, by the time that she f/u with Dr.  [place], she spontaneously converted to SR, and realized that she did feel much better. She continues in SR today and is back to discuss AAD therapy.   Today, she denies symptoms of palpitations, chest pain,, orthopnea, PND, lower extremity edema, dizziness, presyncope, syncope, or neurologic sequela. Chronic shortness of breath The patient is tolerating medications without difficulties and is otherwise without complaint today.   Past Medical History:  Diagnosis Date  . Acid reflux   . Anxiety   . Arthritis   . Cataract    surgery,bilateral  . Constipation   . Depression   . Diabetes mellitus without complication ([place])   . Dyspnea on exertion    a. 07/2015 Echo: EF 55-60%, no rwma, mild MR, mod dil LA/RA/RV, mod TR, triv PR, PASP 35mHg.  . Family history of adverse reaction to anesthesia    mother always had n/v  . Hx: UTI (urinary tract infection)   . Hypercholesterolemia   . Hypertensive heart disease   . Insomnia   . Multifactorial gait disorder   . Non-obstructive CAD    a. 07/2015 Cath: LM 30-40d, LAD min irregs, LCX min irregs, RCA 30p, RA 17, RV 33/7, PA 42/19, PCWP 17.  . Osteoarthritis    a. 06/2015 s/p R total hip arthroplasty.  .Melinda KitchenPAF (paroxysmal atrial fibrillation) ([place]    a. Dx 07/2015, CHA2DS2VASc = 6-->Eliquis.  . Premature atrial contractions   . Pulmonary hypertension    a. 07/2015 Echo: PASP 548mg.  . Melinda Kitchenyloric antral stenosis    in childhood,infancy  . Sleep apnea with use of continuous  positive airway pressure (CPAP) 10/19/2012   a. compliant w/ CPAP.   Past Surgical History:  Procedure Laterality Date  . ABDOMINAL HYSTERECTOMY  1970  . ABDOMINAL SURGERY     as a 64 day old baby  . CARDIAC CATHETERIZATION N/A 07/14/2015   Procedure: Right/Left Heart Cath and Coronary Angiography;  Surgeon: Melinda Klein, MD;  Location: [facility];  Service: Cardiovascular;  Laterality: N/A;  . CARDIOVERSION N/A 11/04/2015   Procedure: CARDIOVERSION;  Surgeon: Melinda Perla, MD;  Location: [facility] ENDOSCOPY;  Service: Cardiovascular;  Laterality: N/A;  . CARDIOVERSION N/A 02/25/2016   Procedure: CARDIOVERSION;  Surgeon: Melinda Casino, MD;  Location: [facility] ENDOSCOPY;  Service: Cardiovascular;  Laterality: N/A;  . COLONOSCOPY    . EYE SURGERY Bilateral    cataract surgery with lens implant  . INNER EAR SURGERY Left    x 3  . LAPAROSCOPIC SALPINGO OOPHERECTOMY    . NM MYOVIEW LTD  11/30/2007   normal stress nuclear study/EF-83%  . TONSILLECTOMY    . TOTAL HIP ARTHROPLASTY Right 06/26/2015   Procedure: RIGHT TOTAL HIP ARTHROPLASTY ANTERIOR APPROACH;  Surgeon: Melinda Koyanagi, MD;  Location: [place];  Service: Orthopedics;  Laterality: Right;  . TRANSTHORACIC ECHOCARDIOGRAM  07/23/2009   mild concentric LVH/mild mitral insufficiency/ moderate tricuspid innsufficiency/mild pulmonary HTN/EF- 55-60%  . VAGINAL DELIVERY      Current Outpatient Prescriptions  Medication Sig Dispense Refill  . acetaminophen (TYLENOL) 650 MG CR tablet Take 650 mg by mouth 2 (two) times daily as needed (arthritis pain).     Melinda [name] amLODipine (NORVASC) 5 MG tablet Take 5 mg by mouth daily.    Melinda [name] apixaban (ELIQUIS) 5 MG TABS tablet Take 1 tablet (5 mg total) by mouth 2 (two) times daily. 60 tablet 0  . atorvastatin (LIPITOR) 40 MG tablet Take 40 mg by mouth daily.     . Blood Glucose Monitoring Suppl (ONE TOUCH ULTRA 2) W/DEVICE KIT Reported on 07/11/2015    . Calcium Carbonate-Vitamin D3 (CALCIUM 600+D3) 600-400 MG-UNIT TABS Take 1 tablet by mouth daily.    . cholecalciferol (VITAMIN D) 1000 UNITS tablet Take 1,000 Units by mouth daily.     Melinda [name] desipramine (NORPRAMIN) 25 MG tablet Take 25 mg by mouth daily.    . diphenhydrAMINE (BENADRYL) 25 mg capsule Take 1 capsule (25 mg total) by mouth every 6 (six) hours as needed for itching or allergies (sore throat). 30 capsule 0  . famotidine (PEPCID) 20 MG tablet Take 20 mg by mouth daily.    Melinda [name] glucosamine-chondroitin 500-400 MG tablet Take 1 tablet by  mouth every other day.     Melinda [name] glucose blood (ACCU-CHEK ACTIVE STRIPS) test strip 1 each by Other route as needed for other. Reported on 07/11/2015    . linaclotide (LINZESS) 145 MCG CAPS capsule Take 145 mcg by mouth daily before breakfast.     . LORazepam (ATIVAN) 0.5 MG tablet Take 0.5 mg by mouth at bedtime.   5  . metoprolol succinate (TOPROL-XL) 25 MG 24 hr tablet Take 1 tablet (25 mg total) by mouth every morning. (Patient taking differently: Take 37.5 mg by mouth every morning. Take one tablet in the morning and one half tablet in the evening) 30 tablet 0  . ONETOUCH DELICA LANCETS 42P MISC Reported on 07/11/2015    . Polyethyl Glycol-Propyl Glycol (SYSTANE) 0.4-0.3 % GEL ophthalmic gel Place 1 application into both eyes at bedtime as needed (dry eyes).    . polyethylene glycol (MIRALAX /  GLYCOLAX) packet Take 17 g by mouth daily. Mix in 8 oz liquid and drink    . PRESCRIPTION MEDICATION Inhale into the lungs at bedtime. Uses CPAP with oxygen    . torsemide (DEMADEX) 10 MG tablet Take 1 tablet (10 mg total) by mouth daily. 30 tablet 0  . vitamin B-12 (CYANOCOBALAMIN) 1000 MCG tablet Take 1,000 mcg by mouth daily.     No current facility-administered medications for this encounter.     Allergies  Allergen Reactions  . Lasix [Furosemide] Itching  . Phenobarbital Itching  . Amitiza [Lubiprostone] Itching    Social History   Social History  . Marital status: Married    Spouse name: N/A  . Number of children: 1  . Years of education: 48   Occupational History  . Retired    Social History Main Topics  . Smoking status: Never Smoker  . Smokeless tobacco: Never Used  . Alcohol use No     Comment: rare  . Drug use: No  . Sexual activity: Not on file   Other Topics Concern  . Not on file   Social History Narrative   Patient lives alone.    Patient is widowed.    Patient retired.    Patient some college.    Patient has one child.    Caffeine 1-2 cups daily avg.    Family  History  Problem Relation Age of Onset  . Heart attack Father   . Heart failure Mother   . Cancer Mother     colon,kidney  . Diabetes Sister   . Hypertension Sister   . Heart failure Sister     ROS- All systems are reviewed and negative except as per the HPI above  Physical Exam: Vitals:   05/13/16 1411  BP: 140/70  Pulse: 72  Weight: 171 lb 9.6 oz (77.8 kg)  Height: 5' (1.524 m)   Wt Readings from Last 3 Encounters:  05/13/16 171 lb 9.6 oz (77.8 kg)  05/07/16 165 lb (74.8 kg)  04/28/16 165 lb (74.8 kg)    Labs: Lab Results  Component Value Date   NA 137 05/13/2016   K 3.6 05/13/2016   CL 100 (L) 05/13/2016   CO2 31 05/13/2016   GLUCOSE 156 (H) 05/13/2016   BUN 16 05/13/2016   CREATININE 0.82 05/13/2016   CALCIUM 9.7 05/13/2016   MG 2.2 05/13/2016   Lab Results  Component Value Date   INR 1.1 10/29/2015   No results found for: CHOL, HDL, LDLCALC, TRIG   GEN- The patient is well appearing, alert and oriented x 3 today.   Head- normocephalic, atraumatic Eyes-  Sclera clear, conjunctiva pink Ears- hearing intact Oropharynx- clear Neck- supple, no JVP Lymph- no cervical lymphadenopathy Lungs- Clear to ausculation bilaterally, normal work of breathing Heart- Regular rate and rhythm, no murmurs, rubs or gallops, PMI not laterally displaced GI- soft, NT, ND, + BS Extremities- no clubbing, cyanosis, or edema MS- no significant deformity or atrophy Skin- no rash or lesion Psych- euthymic mood, full affect Neuro- strength and sensation are intact  EKG- NSR at 72 bpm, pr int 142 ms, qrs int 88 ms, qtc 422 ms Echo-Study Conclusions  - Left ventricle: The cavity size was normal. Systolic function was   normal. The estimated ejection fraction was in the range of 60%   to 65%. Wall motion was normal; there were no regional wall   motion abnormalities. The study is not technically sufficient to   allow evaluation of  LV diastolic function. - Aortic valve:  Transvalvular velocity was within the normal range.   There was no stenosis. There was no regurgitation. - Mitral valve: Calcified annulus. Transvalvular velocity was   within the normal range. There was no evidence for stenosis.   There was moderate regurgitation directed centrally. - Left atrium: The atrium was severely dilated. - Right ventricle: The cavity size was normal. Wall thickness was   normal. Systolic function was normal. - Right atrium: The atrium was severely dilated. - Tricuspid valve: There was moderate regurgitation. - Pulmonic valve: There was moderate regurgitation. - Pulmonary arteries: Systolic pressure was severely increased. PA   peak pressure: 71 mm Hg (S). - Pericardium, extracardiac: A small pericardial effusion was   identified. Features were not consistent with tamponade   physiology.   Assessment and Plan: 1. Persistent afib Pt has had successful cardioversion's in recent past but with ERAF She spontaneously returned to SR and feels better and would like to stay in SR if possible Pt cannot afford tikoyn, it would a tier 4 drug for her She is not excited re amiodarone and potential side effects With h/o of diastolic heart failure, multaq may not be best option and she feels that she may not be able to afford, usually running around 80$ a month in the medicare population Pt thinks she would like to try sotalol, denies asthma, understands that she would have to be hospitalized for this.  She will not be able to use OTC benadryl, norpramin amy be a problem.  Will message Fuller [place], PharmD, to review other meds for possible qtc prolonging drugs.  She will continue current toprol dose for now, may have to decrease dose when sotalol is started  Do not miss doses of apixaban. She would like to defer hospitalization until 3/26. QTC at baseline good at 422 ms Creatinine cl cal 64.89, but K+ at 3.6, will need to replace to get above 4. Start k+ at 10 meq a  day  F/u 3/26, for possible hospitalization that day after labs repeated for K+ over 4 and mag over 2  Butch Penny C. Ziad Maye, [facility] [address] [place], [place] [address] [phone]

## 2016-05-19 DIAGNOSIS — E119 Type 2 diabetes mellitus without complications: Secondary | ICD-10-CM | POA: Diagnosis not present

## 2016-05-19 DIAGNOSIS — E784 Other hyperlipidemia: Secondary | ICD-10-CM | POA: Diagnosis not present

## 2016-05-19 DIAGNOSIS — I10 Essential (primary) hypertension: Secondary | ICD-10-CM | POA: Diagnosis not present

## 2016-05-26 DIAGNOSIS — Z0000 Encounter for general adult medical examination without abnormal findings: Secondary | ICD-10-CM | POA: Diagnosis not present

## 2016-05-26 DIAGNOSIS — I10 Essential (primary) hypertension: Secondary | ICD-10-CM | POA: Diagnosis not present

## 2016-05-26 DIAGNOSIS — E119 Type 2 diabetes mellitus without complications: Secondary | ICD-10-CM | POA: Diagnosis not present

## 2016-05-26 DIAGNOSIS — G4733 Obstructive sleep apnea (adult) (pediatric): Secondary | ICD-10-CM | POA: Diagnosis not present

## 2016-05-26 DIAGNOSIS — M48061 Spinal stenosis, lumbar region without neurogenic claudication: Secondary | ICD-10-CM | POA: Diagnosis not present

## 2016-05-26 DIAGNOSIS — I480 Paroxysmal atrial fibrillation: Secondary | ICD-10-CM | POA: Diagnosis not present

## 2016-05-26 DIAGNOSIS — E784 Other hyperlipidemia: Secondary | ICD-10-CM | POA: Diagnosis not present

## 2016-05-26 DIAGNOSIS — I2510 Atherosclerotic heart disease of native coronary artery without angina pectoris: Secondary | ICD-10-CM | POA: Diagnosis not present

## 2016-05-26 DIAGNOSIS — Z1231 Encounter for screening mammogram for malignant neoplasm of breast: Secondary | ICD-10-CM | POA: Diagnosis not present

## 2016-05-26 DIAGNOSIS — K5909 Other constipation: Secondary | ICD-10-CM | POA: Diagnosis not present

## 2016-05-31 ENCOUNTER — Other Ambulatory Visit: Payer: Self-pay

## 2016-05-31 ENCOUNTER — Ambulatory Visit (HOSPITAL_COMMUNITY)
Admission: RE | Admit: 2016-05-31 | Discharge: 2016-05-31 | Disposition: A | Discharge status: Home / Self Care | Payer: Medicare HMO | Source: Ambulatory Visit | Attending: Nurse Practitioner | Admitting: Nurse Practitioner

## 2016-05-31 ENCOUNTER — Inpatient Hospital Stay (HOSPITAL_COMMUNITY)
Admission: AD | Admit: 2016-05-31 | Discharge: 2016-06-02 | DRG: 309 | Disposition: A | Discharge status: Home / Self Care | Payer: Medicare HMO | Source: Ambulatory Visit | Attending: Internal Medicine | Admitting: Internal Medicine

## 2016-05-31 ENCOUNTER — Encounter (HOSPITAL_COMMUNITY): Payer: Self-pay | Admitting: Nurse Practitioner

## 2016-05-31 VITALS — BP 136/68 | HR 65 | Ht 60.0 in | Wt 173.6 lb

## 2016-05-31 DIAGNOSIS — E785 Hyperlipidemia, unspecified: Secondary | ICD-10-CM | POA: Diagnosis present

## 2016-05-31 DIAGNOSIS — I110 Hypertensive heart disease with heart failure: Secondary | ICD-10-CM | POA: Diagnosis present

## 2016-05-31 DIAGNOSIS — Z79899 Other long term (current) drug therapy: Secondary | ICD-10-CM

## 2016-05-31 DIAGNOSIS — Z90710 Acquired absence of both cervix and uterus: Secondary | ICD-10-CM | POA: Diagnosis not present

## 2016-05-31 DIAGNOSIS — I272 Pulmonary hypertension, unspecified: Secondary | ICD-10-CM | POA: Diagnosis present

## 2016-05-31 DIAGNOSIS — Z888 Allergy status to other drugs, medicaments and biological substances status: Secondary | ICD-10-CM | POA: Diagnosis not present

## 2016-05-31 DIAGNOSIS — R269 Unspecified abnormalities of gait and mobility: Secondary | ICD-10-CM | POA: Diagnosis present

## 2016-05-31 DIAGNOSIS — Z96641 Presence of right artificial hip joint: Secondary | ICD-10-CM | POA: Diagnosis present

## 2016-05-31 DIAGNOSIS — Z8249 Family history of ischemic heart disease and other diseases of the circulatory system: Secondary | ICD-10-CM

## 2016-05-31 DIAGNOSIS — I2510 Atherosclerotic heart disease of native coronary artery without angina pectoris: Secondary | ICD-10-CM | POA: Diagnosis present

## 2016-05-31 DIAGNOSIS — E7800 Pure hypercholesterolemia, unspecified: Secondary | ICD-10-CM | POA: Diagnosis not present

## 2016-05-31 DIAGNOSIS — F419 Anxiety disorder, unspecified: Secondary | ICD-10-CM | POA: Diagnosis not present

## 2016-05-31 DIAGNOSIS — Z5181 Encounter for therapeutic drug level monitoring: Secondary | ICD-10-CM

## 2016-05-31 DIAGNOSIS — I5032 Chronic diastolic (congestive) heart failure: Secondary | ICD-10-CM | POA: Diagnosis present

## 2016-05-31 DIAGNOSIS — Z7901 Long term (current) use of anticoagulants: Secondary | ICD-10-CM | POA: Diagnosis not present

## 2016-05-31 DIAGNOSIS — K219 Gastro-esophageal reflux disease without esophagitis: Secondary | ICD-10-CM | POA: Diagnosis not present

## 2016-05-31 DIAGNOSIS — F329 Major depressive disorder, single episode, unspecified: Secondary | ICD-10-CM | POA: Diagnosis present

## 2016-05-31 DIAGNOSIS — Z961 Presence of intraocular lens: Secondary | ICD-10-CM | POA: Diagnosis present

## 2016-05-31 DIAGNOSIS — G4733 Obstructive sleep apnea (adult) (pediatric): Secondary | ICD-10-CM | POA: Diagnosis not present

## 2016-05-31 DIAGNOSIS — I480 Paroxysmal atrial fibrillation: Secondary | ICD-10-CM | POA: Diagnosis not present

## 2016-05-31 DIAGNOSIS — Z809 Family history of malignant neoplasm, unspecified: Secondary | ICD-10-CM | POA: Diagnosis not present

## 2016-05-31 DIAGNOSIS — I5043 Acute on chronic combined systolic (congestive) and diastolic (congestive) heart failure: Secondary | ICD-10-CM | POA: Diagnosis not present

## 2016-05-31 DIAGNOSIS — E119 Type 2 diabetes mellitus without complications: Secondary | ICD-10-CM | POA: Diagnosis not present

## 2016-05-31 DIAGNOSIS — R001 Bradycardia, unspecified: Secondary | ICD-10-CM | POA: Diagnosis present

## 2016-05-31 DIAGNOSIS — R69 Illness, unspecified: Secondary | ICD-10-CM | POA: Diagnosis not present

## 2016-05-31 DIAGNOSIS — G4700 Insomnia, unspecified: Secondary | ICD-10-CM | POA: Diagnosis present

## 2016-05-31 DIAGNOSIS — R0902 Hypoxemia: Secondary | ICD-10-CM | POA: Diagnosis not present

## 2016-05-31 DIAGNOSIS — Z833 Family history of diabetes mellitus: Secondary | ICD-10-CM | POA: Diagnosis not present

## 2016-05-31 LAB — BASIC METABOLIC PANEL
Anion gap: 10 (ref 5–15)
BUN: 23 mg/dL — AB (ref 6–20)
CHLORIDE: 102 mmol/L (ref 101–111)
CO2: 28 mmol/L (ref 22–32)
Calcium: 9.5 mg/dL (ref 8.9–10.3)
Creatinine, Ser: 1.04 mg/dL — ABNORMAL HIGH (ref 0.44–1.00)
GFR calc Af Amer: 56 mL/min — ABNORMAL LOW (ref >60)
GFR calc non Af Amer: 49 mL/min — ABNORMAL LOW (ref >60)
Glucose, Bld: 146 mg/dL — ABNORMAL HIGH (ref 65–99)
POTASSIUM: 3.8 mmol/L (ref 3.5–5.1)
SODIUM: 140 mmol/L (ref 135–145)

## 2016-05-31 LAB — MAGNESIUM: MAGNESIUM: 2 mg/dL (ref 1.7–2.4)

## 2016-05-31 MED ORDER — FAMOTIDINE 20 MG PO TABS
20.0000 mg | ORAL_TABLET | Freq: Every day | ORAL | Status: DC
  Administered 2016-05-31 – 2016-06-02 (×3): 20 mg via ORAL
  Filled 2016-05-31 (×3): qty 1

## 2016-05-31 MED ORDER — DESIPRAMINE HCL 25 MG PO TABS
25.0000 mg | ORAL_TABLET | Freq: Every day | ORAL | Status: DC
  Administered 2016-05-31 – 2016-06-02 (×3): 25 mg via ORAL
  Filled 2016-05-31 (×4): qty 1

## 2016-05-31 MED ORDER — POLYETHYLENE GLYCOL 3350 17 G PO PACK: 17.0000 g | PACK | Freq: Every day | ORAL | Status: DC

## 2016-05-31 MED ORDER — METOPROLOL SUCCINATE ER 25 MG PO TB24: 25.0000 mg | ORAL_TABLET | Freq: Every morning | ORAL | Status: DC

## 2016-05-31 MED ORDER — SOTALOL HCL 80 MG PO TABS
160.0000 mg | ORAL_TABLET | Freq: Every day | ORAL | Status: DC
  Administered 2016-05-31: 160 mg via ORAL
  Filled 2016-05-31: qty 2

## 2016-05-31 MED ORDER — CALCIUM CARBONATE-VITAMIN D 500-200 MG-UNIT PO TABS
1.0000 | ORAL_TABLET | Freq: Every day | ORAL | Status: DC
  Administered 2016-06-01 – 2016-06-02 (×2): 1 via ORAL
  Filled 2016-05-31 (×2): qty 1

## 2016-05-31 MED ORDER — ACETAMINOPHEN 325 MG PO TABS
650.0000 mg | ORAL_TABLET | ORAL | Status: DC | PRN
  Administered 2016-06-01: 650 mg via ORAL
  Filled 2016-05-31: qty 2

## 2016-05-31 MED ORDER — APIXABAN 5 MG PO TABS
5.0000 mg | ORAL_TABLET | Freq: Two times a day (BID) | ORAL | Status: DC
  Administered 2016-05-31 – 2016-06-02 (×4): 5 mg via ORAL
  Filled 2016-05-31 (×5): qty 1

## 2016-05-31 MED ORDER — CALCIUM CARBONATE-VITAMIN D3 600-400 MG-UNIT PO TABS: 1.0000 | ORAL_TABLET | Freq: Every day | ORAL | Status: DC

## 2016-05-31 MED ORDER — TORSEMIDE 20 MG PO TABS
20.0000 mg | ORAL_TABLET | Freq: Every day | ORAL | Status: DC
  Administered 2016-05-31 – 2016-06-02 (×3): 20 mg via ORAL
  Filled 2016-05-31 (×3): qty 1

## 2016-05-31 MED ORDER — VITAMIN B-12 1000 MCG PO TABS
1000.0000 ug | ORAL_TABLET | Freq: Every day | ORAL | Status: DC
  Administered 2016-05-31 – 2016-06-02 (×3): 1000 ug via ORAL
  Filled 2016-05-31 (×3): qty 1

## 2016-05-31 MED ORDER — METOPROLOL SUCCINATE ER 25 MG PO TB24
12.5000 mg | ORAL_TABLET | Freq: Every evening | ORAL | Status: DC
  Administered 2016-05-31 – 2016-06-01 (×2): 12.5 mg via ORAL
  Filled 2016-05-31 (×2): qty 1

## 2016-05-31 MED ORDER — POLYETHYLENE GLYCOL 3350 17 G PO PACK
17.0000 g | PACK | Freq: Every day | ORAL | Status: DC | PRN
  Filled 2016-05-31: qty 1

## 2016-05-31 MED ORDER — ATORVASTATIN CALCIUM 40 MG PO TABS
40.0000 mg | ORAL_TABLET | Freq: Every day | ORAL | Status: DC
  Administered 2016-05-31 – 2016-06-02 (×3): 40 mg via ORAL
  Filled 2016-05-31 (×3): qty 1

## 2016-05-31 MED ORDER — LINACLOTIDE 145 MCG PO CAPS
145.0000 ug | ORAL_CAPSULE | Freq: Every day | ORAL | Status: DC
  Administered 2016-06-01 – 2016-06-02 (×2): 145 ug via ORAL
  Filled 2016-05-31 (×2): qty 1

## 2016-05-31 MED ORDER — POTASSIUM CHLORIDE CRYS ER 20 MEQ PO TBCR: 40.0000 meq | EXTENDED_RELEASE_TABLET | Freq: Once | ORAL | Status: DC

## 2016-05-31 MED ORDER — POTASSIUM CHLORIDE ER 10 MEQ PO TBCR
10.0000 meq | EXTENDED_RELEASE_TABLET | Freq: Every day | ORAL | Status: DC
  Administered 2016-06-01 – 2016-06-02 (×2): 10 meq via ORAL
  Filled 2016-05-31 (×4): qty 1

## 2016-05-31 MED ORDER — POLYETHYL GLYCOL-PROPYL GLYCOL 0.4-0.3 % OP GEL: 1.0000 "application " | Freq: Every evening | OPHTHALMIC | Status: DC | PRN

## 2016-05-31 MED ORDER — VITAMIN D 1000 UNITS PO TABS
1000.0000 [IU] | ORAL_TABLET | Freq: Every day | ORAL | Status: DC
  Administered 2016-05-31 – 2016-06-02 (×3): 1000 [IU] via ORAL
  Filled 2016-05-31 (×3): qty 1

## 2016-05-31 MED ORDER — AMLODIPINE BESYLATE 5 MG PO TABS
5.0000 mg | ORAL_TABLET | Freq: Every day | ORAL | Status: DC
Start: 2016-05-31 — End: 2016-06-02
  Administered 2016-05-31 – 2016-06-02 (×3): 5 mg via ORAL
  Filled 2016-05-31 (×3): qty 1

## 2016-05-31 MED ORDER — NITROGLYCERIN 0.4 MG SL SUBL: 0.4000 mg | SUBLINGUAL_TABLET | SUBLINGUAL | Status: DC | PRN

## 2016-05-31 MED ORDER — LORAZEPAM 1 MG PO TABS
1.0000 mg | ORAL_TABLET | Freq: Every day | ORAL | Status: DC
  Administered 2016-05-31 – 2016-06-01 (×2): 1 mg via ORAL
  Filled 2016-05-31 (×2): qty 1

## 2016-05-31 NOTE — H&P (Signed)
H&P    Patient ID: Melinda [name] MRN: [id number], DOB/AGE: 11-Oct-1931 83 y.o.  Admit date: 05/31/2016 Date of Admission: 05/31/2016  Primary Physician: Jerlyn Ly, MD Primary Cardiologist: Dr. [place] AF clinic: Roderic Palau, NP  Reason for Visit: Sotalol initiation  HPI: Melinda [name] is a 83 y.o. female with PMHx of HTN, HLD, non-obsrtuctive CAD, DM, and paroxysmal AF associated with a hospital stay 02/25/16 with CHF felt secondary to her AFib w/RVR and then again 02/29/16 with CP associated with rapid AFib, she was referred to the AF clinic by Dr. Martinique to discuss rhythm control strategies given she is very symptomatic when in AF and wanted to purse AAD tx.  In d/w Roderic Palau, evaluation into [facility] noted the cost would be prohibitive for the patient and was decided to purse Sotalol.   The patient feels well, denies any kind of recent illness,no N/V/D, or fever.  Denies any CP, palpitations or SOB.  Says she knows when she is in AF because it makes her feel exhausted.   She denies any missed dosed of her Eliquis since her DCCV in December.  Past Medical History:  Diagnosis Date  . Acid reflux   . Anxiety   . Arthritis   . Cataract    surgery,bilateral  . Constipation   . Depression   . Diabetes mellitus without complication ([place])   . Dyspnea on exertion    a. 07/2015 Echo: EF 55-60%, no rwma, mild MR, mod dil LA/RA/RV, mod TR, triv PR, PASP 16mmHg.  . Family history of adverse reaction to anesthesia    mother always had n/v  . Hx: UTI (urinary tract infection)   . Hypercholesterolemia   . Hypertensive heart disease   . Insomnia   . Multifactorial gait disorder   . Non-obstructive CAD    a. 07/2015 Cath: LM 30-40d, LAD min irregs, LCX min irregs, RCA 30p, RA 17, RV 33/7, PA 42/19, PCWP 17.  . Osteoarthritis    a. 06/2015 s/p R total hip arthroplasty.  [name] PAF (paroxysmal atrial fibrillation) ([place])    a. Dx 07/2015, CHA2DS2VASc = 6-->Eliquis.  . Premature  atrial contractions   . Pulmonary hypertension    a. 07/2015 Echo: PASP 23mmHg.  [name] Pyloric antral stenosis    in childhood,infancy  . Sleep apnea with use of continuous positive airway pressure (CPAP) 10/19/2012   a. compliant w/ CPAP.     Surgical History:  Past Surgical History:  Procedure Laterality Date  . ABDOMINAL HYSTERECTOMY  1970  . ABDOMINAL SURGERY     as a 28 day old baby  . CARDIAC CATHETERIZATION N/A 07/14/2015   Procedure: Right/Left Heart Cath and Coronary Angiography;  Surgeon: Sanda Klein, MD;  Location: [facility];  Service: Cardiovascular;  Laterality: N/A;  . CARDIOVERSION N/A 11/04/2015   Procedure: CARDIOVERSION;  Surgeon: Lelon Perla, MD;  Location: [facility] ENDOSCOPY;  Service: Cardiovascular;  Laterality: N/A;  . CARDIOVERSION N/A 02/25/2016   Procedure: CARDIOVERSION;  Surgeon: Pixie Casino, MD;  Location: [facility] ENDOSCOPY;  Service: Cardiovascular;  Laterality: N/A;  . COLONOSCOPY    . EYE SURGERY Bilateral    cataract surgery with lens implant  . INNER EAR SURGERY Left    x 3  . LAPAROSCOPIC SALPINGO OOPHERECTOMY    . NM MYOVIEW LTD  11/30/2007   normal stress nuclear study/EF-83%  . TONSILLECTOMY    . TOTAL HIP ARTHROPLASTY Right 06/26/2015   Procedure: RIGHT TOTAL HIP ARTHROPLASTY ANTERIOR APPROACH;  Surgeon:  Leandrew Koyanagi, MD;  Location: [facility];  Service: Orthopedics;  Laterality: Right;  . TRANSTHORACIC ECHOCARDIOGRAM  07/23/2009   mild concentric LVH/mild mitral insufficiency/ moderate tricuspid innsufficiency/mild pulmonary HTN/EF- 55-60%  . VAGINAL DELIVERY       Prescriptions Prior to Admission  Medication Sig Dispense Refill Last Dose  . acetaminophen (TYLENOL) 650 MG CR tablet Take 650-1,300 mg by mouth 2 (two) times daily as needed for pain (arthritis pain).    Past Month at Unknown time  . amLODipine (NORVASC) 5 MG tablet Take 5 mg by mouth daily.   05/30/2016 at Unknown time  . apixaban (ELIQUIS) 5 MG TABS tablet Take 1 tablet (5 mg  total) by mouth 2 (two) times daily. 60 tablet 0 05/30/2016 at 2230  . atorvastatin (LIPITOR) 40 MG tablet Take 40 mg by mouth daily.    05/30/2016 at Unknown time  . Calcium Carbonate-Vitamin D3 (CALCIUM 600+D3) 600-400 MG-UNIT TABS Take 1 tablet by mouth daily.   05/30/2016 at Unknown time  . cholecalciferol (VITAMIN D) 1000 UNITS tablet Take 1,000 Units by mouth daily.    05/30/2016 at Unknown time  . desipramine (NORPRAMIN) 25 MG tablet Take 25 mg by mouth daily.   05/30/2016 at Unknown time  . diphenhydrAMINE (BENADRYL) 25 mg capsule Take 1 capsule (25 mg total) by mouth every 6 (six) hours as needed for itching or allergies (sore throat). 30 capsule 0 Unknown at Unknown  . famotidine (PEPCID) 20 MG tablet Take 20 mg by mouth daily.   05/30/2016 at Unknown time  . glucosamine-chondroitin 500-400 MG tablet Take 1 tablet by mouth every other day.    05/30/2016 at Unknown time  . linaclotide (LINZESS) 145 MCG CAPS capsule Take 145 mcg by mouth daily before breakfast.    05/30/2016 at Unknown time  . LORazepam (ATIVAN) 0.5 MG tablet Take 1 mg by mouth at bedtime.   5 05/30/2016 at Unknown time  . metoprolol succinate (TOPROL-XL) 25 MG 24 hr tablet Take 1 tablet (25 mg total) by mouth every morning. (Patient taking differently: Take 12.5-25 mg by mouth See admin instructions. Take 25 mg by mouth in the morning and take 12.5 mg by mouth in the evening.) 30 tablet 0 05/30/2016 at 2230  . Multiple Vitamins-Minerals (OCUVITE PO) Take 1 tablet by mouth daily.   05/30/2016 at Unknown time  . polyethylene glycol (MIRALAX / GLYCOLAX) packet Take 17 g by mouth daily. Mix in 8 oz liquid and drink   05/30/2016 at Unknown time  . potassium chloride (K-DUR) 10 MEQ tablet Take 1 tablet (10 mEq total) by mouth daily. 30 tablet 6 05/30/2016 at Unknown time  . PRESCRIPTION MEDICATION Inhale into the lungs at bedtime. Uses CPAP with oxygen   05/30/2016 at Unknown time  . torsemide (DEMADEX) 10 MG tablet Take 1 tablet (10 mg total)  by mouth daily. (Patient taking differently: Take 20 mg by mouth daily. ) 30 tablet 0 05/30/2016 at Unknown time  . vitamin B-12 (CYANOCOBALAMIN) 1000 MCG tablet Take 1,000 mcg by mouth daily.   05/30/2016 at Unknown time  . Polyethyl Glycol-Propyl Glycol (SYSTANE) 0.4-0.3 % GEL ophthalmic gel Place 1 application into both eyes at bedtime as needed (dry eyes).   Taking    Inpatient Medications:   Allergies:  Allergies  Allergen Reactions  . Lasix [Furosemide] Itching  . Phenobarbital Itching  . Amitiza [Lubiprostone] Itching    Social History   Social History  . Marital status: Married    Spouse name: N/A  .  Number of children: 1  . Years of education: 25   Occupational History  . Retired    Social History Main Topics  . Smoking status: Never Smoker  . Smokeless tobacco: Never Used  . Alcohol use No     Comment: rare  . Drug use: No  . Sexual activity: Not on file   Other Topics Concern  . Not on file   Social History Narrative   Patient lives alone.    Patient is widowed.    Patient retired.    Patient some college.    Patient has one child.    Caffeine 1-2 cups daily avg.     Family History  Problem Relation Age of Onset  . Heart attack Father   . Heart failure Mother   . Cancer Mother     colon,kidney  . Diabetes Sister   . Hypertension Sister   . Heart failure Sister      Review of Systems: All other systems reviewed and are otherwise negative except as noted above.  Physical Exam: Vitals:   05/31/16 1200  BP: (!) 143/58  Pulse: 63  Resp: 18  SpO2: 98%  Weight: 171 lb 11.2 oz (77.9 kg)  Height: 5' (1.524 m)    GEN- The patient is well appearing, alert and oriented x 3 today.   HEENT: normocephalic, atraumatic; sclera clear, conjunctiva pink; hearing intact; oropharynx clear; neck supple, no JVP Lymph- no cervical lymphadenopathy Lungs- CTA b/l, normal work of breathing.  No wheezes, rales, rhonchi Heart- RRR, no murmurs, rubs or gallops,  PMI not laterally displaced GI- soft, non-tender, non-distended Extremities- no clubbing, cyanosis, trace edema MS- no significant deformity or atrophy Skin- warm and dry, no rash or lesion Psych- euthymic mood, full affect Neuro- no gross deficits observed  Labs:   Lab Results  Component Value Date   WBC 5.3 03/11/2016   HGB 13.2 03/11/2016   HCT 40.2 03/11/2016   MCV 95.7 03/11/2016   PLT 224 03/11/2016    Recent Labs Lab 05/31/16 0947  NA 140  K 3.8  CL 102  CO2 28  BUN 23*  CREATININE 1.04*  CALCIUM 9.5  GLUCOSE 146*      Radiology/Studies:  None      EKG: SR, 65bpm, PR 157ms, QRS 158ms, QTc 344ms TELEMETRY: SR 60's  03/02/16: TTE Study Conclusions - Left ventricle: The cavity size was normal. Systolic function was   normal. The estimated ejection fraction was in the range of 60%   to 65%. Wall motion was normal; there were no regional wall   motion abnormalities. The study is not technically sufficient to   allow evaluation of LV diastolic function. - Aortic valve: Transvalvular velocity was within the normal range.   There was no stenosis. There was no regurgitation. - Mitral valve: Calcified annulus. Transvalvular velocity was   within the normal range. There was no evidence for stenosis.   There was moderate regurgitation directed centrally. - Left atrium: The atrium was severely dilated. (79mm) - Right ventricle: The cavity size was normal. Wall thickness was   normal. Systolic function was normal. - Right atrium: The atrium was severely dilated. - Tricuspid valve: There was moderate regurgitation. - Pulmonic valve: There was moderate regurgitation. - Pulmonary arteries: Systolic pressure was severely increased. PA   peak pressure: 71 mm Hg (S). - Pericardium, extracardiac: A small pericardial effusion was   identified. Features were not consistent with tamponade   physiology.  07/14/15: LHC Mild coronary atherosclerosis,  not hemodynamically  significant. Minimally elevated LVEDP at rest, consistent with mild diastolic dysfunction. Mild pulmonary artery hypertension. Transpulmonary gradient >12 mm Hg suggests intrinsic pulmonary arteriolar hypertension, consistent with a pulmonary cause for PAH and dyspnea.  Assessment and Plan:   1. Paroxysmal AFib     CHA2DS2Vasc is at least 5, on Eliquis     K+ 3.8     Mag 2.0     Creat 1.04 (cal Cr. Cl = 51)     discussed dosing regime with Dr. Lovena Le, will start 160mg  daily     QTc 331ms  RPH note is appreciated, recommends stop use of PRN benadryl, and while the desipramine is not an absolute contraindication, recommended the patient d/w her PMD an alternative to a TCA. Patient is aware of the recommendation to stop using benadryl which she takes for occasional itching she attributes to her torsemide (had severe itching with lasix).  She reports being on the TCA for many years, an not inclined to want to change it, this has worked well for her.  I have discussed with Dr. Lovena Le, [facility] to proceed  2. OSA     Compliant w/CPAP  3. HTN     Resume her home meds  4. DM     Diet controlled        Signed, Tommye Standard, PA-C 05/31/2016 12:28 PM  EP Attending Patient seen and examined. Agree with the findings as documented above by Dillon Bjork, PA-C. The patient is an 83 yo woman with PAF who is admitted for initiation of sotalol. She has a RRR on exam and clear lungs and no edema. Her neuro exam is normal. Tele demonstrates NSR and ECG reveals NSR and QT is not prolonged.  A/P 1. PAF - she will be admitted for initiation of sotalol. Will plan to monitor for 2-3 days.  2. HTN - her blood pressure will be followed closely 3. Sleep apnea - she will continue CPAP.  Mikle Bosworth.D.

## 2016-05-31 NOTE — Procedures (Signed)
Placed patient on CPAP for the night.  Patient is tolerating well at this time. 

## 2016-05-31 NOTE — Progress Notes (Signed)
Notification to [facility] started for direct admission. Reference #[id number]. Utilization review should fax clinical documents to fax # [phone] attention precertification.

## 2016-05-31 NOTE — Progress Notes (Signed)
Patient arrived as a direct admit to 2W room 13.  Telemetry monitor applied and CCMD notified.  Patient oriented to unit and room to include call light and phone.  Will continue to monitor.

## 2016-05-31 NOTE — Progress Notes (Signed)
Primary Care Physician: Jerlyn Ly, MD Referring Physician: [facility] f/u Cardiologist: Dr. Martinique   Melinda [name] is a 83 y.o. female with a h/o HTN, HLD, CAD, DM type II, PAF on Eliquis; who presented to [facility] ER, 12/24, with a reportedseveral month history of intermittent shortness of breath that acutely worsened over the last few days. She previously noticed shortness of breath symptoms when significantly exerting herself such as walking upstairs or walking long distances. However,over the last few days prior to admission, she could not walk even 2-3 steps without having to pause to catch her breath. States that she's only on oxygen with her CPAP mask at night. Associated symptoms included fatigue, 2 pillow orthopnea, lower extremity swelling, wheezing,dry cough, and chills. She was diagnosed with pneumonia, with hypoxia and acute on chronic diastolic dysfunction with afib with RVR and  Patient just underwent cardioversion, 12/20, for atrial fibrillation 4 days ago. This was her second time having this procedure done, previous was in May of this year.She reports taking her Eliquis regularly.   She's had no other recent changes to her medication regimen.    She left the hospital 12/24 and was in [place]. She was in the kitchen 1/4 and started having chest/neck/jaw pain. She was taken back to the ER and found to be back in afib with rvr.. She was discharged to home with f/u in the afib clinic. She is feeling at her baseline today and afib is controlled.  HR at home is controlled by reviewing pt's HR/BP log.. When afib first returned, her rates were higher. Echo shows severely enlarged rt/left atrium and with severe pulmonary HTN.   She returns to afib clinic 05/14/16 to further discuss antiarrythmic's. She stated when I saw her last, after antiarrythmic's were discussed in detail, that she was tolerating rate controlled afib and deferred AAD therapy. However, by the time that she f/u with Dr.  Martinique, she spontaneously converted to SR, and realized that she did feel much better. Dr. Martinique as well wanted her to stay in [place] and felt she would require an antiarrythmic. She continues in SR today and is back to discuss AAD therapy.   Returns to [place] clinic 3/26 for planned admission for sotalol. She continues in afib but is very symptomatic when afib occurs  with RVR. She did not take any am meds but a dose of eliquis  5 mg was given to prevent interruption of DOAC. Labs show a K+ of 3.8 and mag of 2.0. Crcl cal is 51.66.  Today, she denies symptoms of palpitations, chest pain,, orthopnea, PND, lower extremity edema, dizziness, presyncope, syncope, or neurologic sequela. Chronic shortness of breath The patient is tolerating medications without difficulties and is otherwise without complaint today.   Past Medical History:  Diagnosis Date  . Acid reflux   . Anxiety   . Arthritis   . Cataract    surgery,bilateral  . Constipation   . Depression   . Diabetes mellitus without complication ([place])   . Dyspnea on exertion    a. 07/2015 Echo: EF 55-60%, no rwma, mild MR, mod dil LA/RA/RV, mod TR, triv PR, PASP 15mHg.  . Family history of adverse reaction to anesthesia    mother always had n/v  . Hx: UTI (urinary tract infection)   . Hypercholesterolemia   . Hypertensive heart disease   . Insomnia   . Multifactorial gait disorder   . Non-obstructive CAD    a. 07/2015 Cath: LM 30-40d, LAD min irregs,  LCX min irregs, RCA 30p, RA 17, RV 33/7, PA 42/19, PCWP 17.  . Osteoarthritis    a. 06/2015 s/p R total hip arthroplasty.  [name] PAF (paroxysmal atrial fibrillation) ([place])    a. Dx 07/2015, CHA2DS2VASc = 6-->Eliquis.  . Premature atrial contractions   . Pulmonary hypertension    a. 07/2015 Echo: PASP 45mHg.  .Marland KitchenPyloric antral stenosis    in childhood,infancy  . Sleep apnea with use of continuous positive airway pressure (CPAP) 10/19/2012   a. compliant w/ CPAP.   Past Surgical History:  Procedure  Laterality Date  . ABDOMINAL HYSTERECTOMY  1970  . ABDOMINAL SURGERY     as a 974day old baby  . CARDIAC CATHETERIZATION N/A 07/14/2015   Procedure: Right/Left Heart Cath and Coronary Angiography;  Surgeon: MSanda Klein MD;  [facility];  Service: Cardiovascular;  Laterality: N/A;  . CARDIOVERSION N/A 11/04/2015   Procedure: CARDIOVERSION;  Surgeon: BLelon Perla MD;  [facility];  Service: Cardiovascular;  Laterality: N/A;  . CARDIOVERSION N/A 02/25/2016   Procedure: CARDIOVERSION;  Surgeon: KPixie Casino MD;  [facility];  Service: Cardiovascular;  Laterality: N/A;  . COLONOSCOPY    . EYE SURGERY Bilateral    cataract surgery with lens implant  . INNER EAR SURGERY Left    x 3  . LAPAROSCOPIC SALPINGO OOPHERECTOMY    . NM MYOVIEW LTD  11/30/2007   normal stress nuclear study/EF-83%  . TONSILLECTOMY    . TOTAL HIP ARTHROPLASTY Right 06/26/2015   Procedure: RIGHT TOTAL HIP ARTHROPLASTY ANTERIOR APPROACH;  Surgeon: NLeandrew Koyanagi MD;  [facility]  Service: Orthopedics;  Laterality: Right;  . TRANSTHORACIC ECHOCARDIOGRAM  07/23/2009   mild concentric LVH/mild mitral insufficiency/ moderate tricuspid innsufficiency/mild pulmonary HTN/EF- 55-60%  . VAGINAL DELIVERY      Current Outpatient Prescriptions  Medication Sig Dispense Refill  . acetaminophen (TYLENOL) 650 MG CR tablet Take 650 mg by mouth 2 (two) times daily as needed (arthritis pain).     .Marland KitchenamLODipine (NORVASC) 5 MG tablet Take 5 mg by mouth daily.    .Marland Kitchenapixaban (ELIQUIS) 5 MG TABS tablet Take 1 tablet (5 mg total) by mouth 2 (two) times daily. 60 tablet 0  . atorvastatin (LIPITOR) 40 MG tablet Take 40 mg by mouth daily.     . Blood Glucose Monitoring Suppl (ONE TOUCH ULTRA 2) W/DEVICE KIT Reported on 07/11/2015    . Calcium Carbonate-Vitamin D3 (CALCIUM 600+D3) 600-400 MG-UNIT TABS Take 1 tablet by mouth daily.    . cholecalciferol (VITAMIN D) 1000 UNITS tablet Take 1,000 Units by  mouth daily.     .Marland Kitchendesipramine (NORPRAMIN) 25 MG tablet Take 25 mg by mouth daily.    . diphenhydrAMINE (BENADRYL) 25 mg capsule Take 1 capsule (25 mg total) by mouth every 6 (six) hours as needed for itching or allergies (sore throat). 30 capsule 0  . famotidine (PEPCID) 20 MG tablet Take 20 mg by mouth daily.    .Marland Kitchenglucosamine-chondroitin 500-400 MG tablet Take 1 tablet by mouth every other day.     .Marland Kitchenglucose blood (ACCU-CHEK ACTIVE STRIPS) test strip 1 each by Other route as needed for other. Reported on 07/11/2015    . linaclotide (LINZESS) 145 MCG CAPS capsule Take 145 mcg by mouth daily before breakfast.     . LORazepam (ATIVAN) 0.5 MG tablet Take 0.5 mg by mouth at bedtime.   5  . metoprolol succinate (TOPROL-XL) 25 MG 24 hr tablet  Take 1 tablet (25 mg total) by mouth every morning. (Patient taking differently: Take 37.5 mg by mouth every morning. Take one tablet in the morning and one half tablet in the evening) 30 tablet 0  . ONETOUCH DELICA LANCETS 24M MISC Reported on 07/11/2015    . Polyethyl Glycol-Propyl Glycol (SYSTANE) 0.4-0.3 % GEL ophthalmic gel Place 1 application into both eyes at bedtime as needed (dry eyes).    . polyethylene glycol (MIRALAX / GLYCOLAX) packet Take 17 g by mouth daily. Mix in 8 oz liquid and drink    . potassium chloride (K-DUR) 10 MEQ tablet Take 1 tablet (10 mEq total) by mouth daily. 30 tablet 6  . PRESCRIPTION MEDICATION Inhale into the lungs at bedtime. Uses CPAP with oxygen    . torsemide (DEMADEX) 10 MG tablet Take 1 tablet (10 mg total) by mouth daily. 30 tablet 0  . vitamin B-12 (CYANOCOBALAMIN) 1000 MCG tablet Take 1,000 mcg by mouth daily.     Current Facility-Administered Medications  Medication Dose Route Frequency Provider Last Rate Last Dose  . potassium chloride SA (K-DUR,KLOR-CON) CR tablet 40 mEq  40 mEq Oral Once Sherran Needs, NP        Allergies  Allergen Reactions  . Lasix [Furosemide] Itching  . Phenobarbital Itching  . Amitiza  [Lubiprostone] Itching    Social History   Social History  . Marital status: Married    Spouse name: N/A  . Number of children: 1  . Years of education: 65   Occupational History  . Retired    Social History Main Topics  . Smoking status: Never Smoker  . Smokeless tobacco: Never Used  . Alcohol use No     Comment: rare  . Drug use: No  . Sexual activity: Not on file   Other Topics Concern  . Not on file   Social History Narrative   Patient lives alone.    Patient is widowed.    Patient retired.    Patient some college.    Patient has one child.    Caffeine 1-2 cups daily avg.    Family History  Problem Relation Age of Onset  . Heart attack Father   . Heart failure Mother   . Cancer Mother     colon,kidney  . Diabetes Sister   . Hypertension Sister   . Heart failure Sister     ROS- All systems are reviewed and negative except as per the HPI above  Physical Exam: Vitals:   05/31/16 0951  BP: 136/68  Pulse: 65  Weight: 173 lb 9.6 oz (78.7 kg)  Height: 5' (1.524 m)   Wt Readings from Last 3 Encounters:  05/31/16 173 lb 9.6 oz (78.7 kg)  05/13/16 171 lb 9.6 oz (77.8 kg)  05/07/16 165 lb (74.8 kg)    Labs: Lab Results  Component Value Date   NA 140 05/31/2016   K 3.8 05/31/2016   CL 102 05/31/2016   CO2 28 05/31/2016   GLUCOSE 146 (H) 05/31/2016   BUN 23 (H) 05/31/2016   CREATININE 1.04 (H) 05/31/2016   CALCIUM 9.5 05/31/2016   MG 2.0 05/31/2016   Lab Results  Component Value Date   INR 1.1 10/29/2015   No results found for: CHOL, HDL, LDLCALC, TRIG   GEN- The patient is well appearing, alert and oriented x 3 today.   Head- normocephalic, atraumatic Eyes-  Sclera clear, conjunctiva pink Ears- hearing intact Oropharynx- clear Neck- supple, no JVP Lymph- no cervical lymphadenopathy  Lungs- Clear to ausculation bilaterally, normal work of breathing Heart- Regular rate and rhythm, no murmurs, rubs or gallops, PMI not laterally  displaced GI- soft, NT, ND, + BS Extremities- no clubbing, cyanosis, or edema MS- no significant deformity or atrophy Skin- no rash or lesion Psych- euthymic mood, full affect Neuro- strength and sensation are intact  EKG- NSR at 65 bpm, pr int 144 ms, qrs int 100 ms, qtc 374 ms Echo-Study Conclusions  - Left ventricle: The cavity size was normal. Systolic function was   normal. The estimated ejection fraction was in the range of 60%   to 65%. Wall motion was normal; there were no regional wall   motion abnormalities. The study is not technically sufficient to   allow evaluation of LV diastolic function. - Aortic valve: Transvalvular velocity was within the normal range.   There was no stenosis. There was no regurgitation. - Mitral valve: Calcified annulus. Transvalvular velocity was   within the normal range. There was no evidence for stenosis.   There was moderate regurgitation directed centrally. - Left atrium: The atrium was severely dilated. - Right ventricle: The cavity size was normal. Wall thickness was   normal. Systolic function was normal. - Right atrium: The atrium was severely dilated. - Tricuspid valve: There was moderate regurgitation. - Pulmonic valve: There was moderate regurgitation. - Pulmonary arteries: Systolic pressure was severely increased. PA   peak pressure: 71 mm Hg (S). - Pericardium, extracardiac: A small pericardial effusion was   identified. Features were not consistent with tamponade   physiology.   Assessment and Plan: 1. H/o persistent symptomatic afib Pt has had successful cardioversion's in recent past(dec/jan) but with ERAF She spontaneously returned to SR and feels better and would like to stay in SR if possible Pt cannot afford tikoyn, it would a tier 4 drug for her She is not excited re amiodarone and potential side effects With h/o of diastolic heart failure, multaq may not be best option and she feels that she may not be able to  afford, usually running around 80$ a month in the medicare population Pt is here to be admitted for sotalol She will not be able to use OTC benadryl for intermittent itching Norpramine is not a hard stop but can contribute to Qt prolongation, pt states it helps her mood and she does not think she can do without it. QTc is very acceptable today at 374 ms Pt currently at 65 bpm, without any toprol this am, may have to watch for brady with sotalol bing added and decrease/stop BB if so. K+ at 3.8 and will need replacement when admitted to unit. She has been taking 10 meq daily, none this am. Mag at 2.0. Crcl cal at 51.77,so renal dosing may be necessary at one tab per 24 hours. Creatinine today 1.04, 2 weeks ago at 0.82.  afib clinic as needed  Butch Penny C. Drevon Plog, [facility] [address] [place], [place] [address] [phone]

## 2016-06-01 ENCOUNTER — Encounter (HOSPITAL_COMMUNITY): Payer: Self-pay | Admitting: *Deleted

## 2016-06-01 LAB — BASIC METABOLIC PANEL
ANION GAP: 11 (ref 5–15)
BUN: 21 mg/dL — ABNORMAL HIGH (ref 6–20)
CO2: 30 mmol/L (ref 22–32)
Calcium: 9.2 mg/dL (ref 8.9–10.3)
Chloride: 98 mmol/L — ABNORMAL LOW (ref 101–111)
Creatinine, Ser: 1.01 mg/dL — ABNORMAL HIGH (ref 0.44–1.00)
GFR calc Af Amer: 58 mL/min — ABNORMAL LOW (ref >60)
GFR calc non Af Amer: 50 mL/min — ABNORMAL LOW (ref >60)
Glucose, Bld: 145 mg/dL — ABNORMAL HIGH (ref 65–99)
POTASSIUM: 3.9 mmol/L (ref 3.5–5.1)
SODIUM: 139 mmol/L (ref 135–145)

## 2016-06-01 LAB — MAGNESIUM: MAGNESIUM: 2.3 mg/dL (ref 1.7–2.4)

## 2016-06-01 MED ORDER — SOTALOL HCL 80 MG PO TABS
160.0000 mg | ORAL_TABLET | Freq: Every day | ORAL | Status: DC
  Administered 2016-06-01 – 2016-06-02 (×2): 160 mg via ORAL
  Filled 2016-06-01 (×2): qty 2

## 2016-06-01 NOTE — Discharge Summary (Signed)
ELECTROPHYSIOLOGY PROCEDURE DISCHARGE SUMMARY    Patient ID: Melinda [name],  MRN: [id number], DOB/AGE: 06-Sep-1931 83 y.o.  Admit date: 05/31/2016 Discharge date: 06/02/16  Primary Care Physician: Jerlyn Ly, MD  Primary Cardiologist: Dr. Martinique   Primary Discharge Diagnosis:  1.  Paroxysmal atrial fibrillation status post Sotalol loading this admission      CHA2DS2Vasc is 5, on Eliquis  Secondary Discharge Diagnosis:  1. HTN 2. HLD 3. DM (diet controlled)  Allergies  Allergen Reactions  . Lasix [Furosemide] Itching  . Phenobarbital Itching  . Amitiza [Lubiprostone] Itching     Procedures This Admission:  1.  Sotalol loading   Brief HPI: Melinda [name] is a 83 y.o. female with a past medical history as noted above.  She was referred to the AFib clinic in the outpatient setting for treatment options of atrial fibrillation.  Risks, benefits, and alternatives to Sotalol were reviewed with the patient who wished to proceed.    Hospital Course:  The patient was admitted and Sotalol was initiated.  Renal function and electrolytes were followed during the hospitalization.  Her QTc remained stable.  She arrived in and mantained sinus rhythm monitored on telemetry throughout her stay.  On the day of discharge, she was examined by Dr Lovena Le who considered her stable for discharge to home.  Will stop her AM dose of metoprolol and continue her evening dose with the sotalol in the AM. Creat today is 1.48, clearance 35, still OK for her Sotalol, it appears the patient had initially reported taking 20mg  daily though was only taking 10mg , I have asked her to skip her torsemide and K+ tomorrow, and resume her 10mg  dose as usual on Friday. Follow-up has been arranged with the AFib clnic on Monday for a visit, EKG and BMET, and with Dr Martinique in 4 weeks.   Physical Exam: Vitals:   06/01/16 0448 06/01/16 1431 06/01/16 2012 06/02/16 0531  BP: (!) 124/58 (!) 102/50 116/68  111/65  Pulse: (!) 53 (!) 45 (!) 56 62  Resp: 18 18 18 20   Temp: 97.7 F (36.5 C)  97.5 F (36.4 C) 97.8 F (36.6 C)  TempSrc: Oral  Oral Oral  SpO2: 98% 100% 97% 94%  Weight: 171 lb 1.6 oz (77.6 kg)   168 lb 11.2 oz (76.5 kg)  Height:        GEN- The patient is well appearing, alert and oriented x 3 today.   HEENT: normocephalic, atraumatic; sclera clear, conjunctiva pink; hearing intact; oropharynx clear; neck supple, no JVP Lymph- no cervical lymphadenopathy Lungs- CTA b/l, normal work of breathing.  No wheezes, rales, rhonchi Heart- RRR, no murmurs, rubs or gallops, PMI not laterally displaced GI- soft, non-tender, non-distended Extremities- no clubbing, cyanosis, or edema MS- no significant deformity or atrophy Skin- warm and dry, no rash or lesion Psych- euthymic mood, full affect Neuro- strength and sensation are intact   Labs:   Lab Results  Component Value Date   WBC 5.3 03/11/2016   HGB 13.2 03/11/2016   HCT 40.2 03/11/2016   MCV 95.7 03/11/2016   PLT 224 03/11/2016     Recent Labs Lab 06/02/16 0240  NA 137  K 4.0  CL 97*  CO2 29  BUN 34*  CREATININE 1.48*  CALCIUM 9.3  GLUCOSE 136*     Discharge Medications:  Allergies as of 06/02/2016      Reactions   Lasix [furosemide] Itching   Phenobarbital Itching   Amitiza [lubiprostone] Itching  Medication List    TAKE these medications   acetaminophen 650 MG CR tablet Commonly known as:  TYLENOL Take 650-1,300 mg by mouth 2 (two) times daily as needed for pain (arthritis pain).   amLODipine 5 MG tablet Commonly known as:  NORVASC Take 5 mg by mouth daily.   apixaban 5 MG Tabs tablet Commonly known as:  ELIQUIS Take 1 tablet (5 mg total) by mouth 2 (two) times daily.   atorvastatin 40 MG tablet Commonly known as:  LIPITOR Take 40 mg by mouth daily.   CALCIUM 600+D3 600-400 MG-UNIT Tabs Generic drug:  Calcium Carbonate-Vitamin D3 Take 1 tablet by mouth daily.   cholecalciferol 1000  units tablet Commonly known as:  VITAMIN D Take 1,000 Units by mouth daily.   desipramine 25 MG tablet Commonly known as:  NORPRAMIN Take 25 mg by mouth daily.   famotidine 20 MG tablet Commonly known as:  PEPCID Take 20 mg by mouth daily.   glucosamine-chondroitin 500-400 MG tablet Take 1 tablet by mouth every other day.   linaclotide 145 MCG Caps capsule Commonly known as:  LINZESS Take 145 mcg by mouth daily before breakfast.   LORazepam 0.5 MG tablet Commonly known as:  ATIVAN Take 1 mg by mouth at bedtime.   metoprolol succinate 25 MG 24 hr tablet Commonly known as:  TOPROL-XL Take 0.5 tablets (12.5 mg total) by mouth every evening. What changed:  how much to take  when to take this   OCUVITE PO Take 1 tablet by mouth daily.   polyethylene glycol packet Commonly known as:  MIRALAX / GLYCOLAX Take 17 g by mouth daily. Mix in 8 oz liquid and drink   potassium chloride 10 MEQ tablet Commonly known as:  K-DUR Take 1 tablet (10 mEq total) by mouth daily.   PRESCRIPTION MEDICATION Inhale into the lungs at bedtime. Uses CPAP with oxygen   sotalol 160 MG tablet Commonly known as:  BETAPACE Take 1 tablet (160 mg total) by mouth daily. Take in the morning   SYSTANE 0.4-0.3 % Gel ophthalmic gel Generic drug:  Polyethyl Glycol-Propyl Glycol Place 1 application into both eyes at bedtime as needed (dry eyes).   torsemide 10 MG tablet Commonly known as:  DEMADEX Take 1 tablet (10 mg total) by mouth daily. What changed:  how much to take   vitamin B-12 1000 MCG tablet Commonly known as:  CYANOCOBALAMIN Take 1,000 mcg by mouth daily.       Disposition:  Home Discharge Instructions    Diet - low sodium heart healthy    Complete by:  As directed    Increase activity slowly    Complete by:  As directed      Follow-up Information    [facility] [place] Follow up on 06/07/2016.   Specialty:  Cardiology Why:  10:30AM Contact  information: [address] [id number] mc [place] [phone]       Peter Martinique, MD Follow up on 07/05/2016.   Specialty:  Cardiology Why:  11:20AM Contact information: [place] [address] [phone]           Duration of Discharge Encounter: Greater than 30 minutes including physician time.  SignedTommye Standard, PA-C 06/02/2016 2:28 PM  EP Attending  Patient seen and examined. Agree with above. She is stable for DC home.  Mikle Bosworth.D.

## 2016-06-01 NOTE — Progress Notes (Signed)
Patient had RT check to water chamber for the CPAP.  Patient was able to apply the CPAP with her personal nasal pillows and turn machine on with no assistance.  RT advised patient that if she needed any assistance later to have RN contact respiratory.    06/01/16 2127  BiPAP/CPAP/SIPAP  BiPAP/CPAP/SIPAP Pt Type Adult  Mask Type Nasal pillows (patient's own equip)  Respiratory Rate 18 breaths/min  EPAP 8 cmH2O  Oxygen Percent 21 %  BiPAP/CPAP/SIPAP CPAP  Patient Home Equipment No (pt's nasal pillows)  Auto Titrate No  BiPAP/CPAP /SiPAP Vitals  Bilateral Breath Sounds Clear;Diminished

## 2016-06-01 NOTE — Progress Notes (Signed)
    SUBJECTIVE: The patient is doing well today.  At this time, she denies chest pain, shortness of breath, or any new concerns.  [name] amLODipine  5 mg Oral Daily  . apixaban  5 mg Oral BID  . atorvastatin  40 mg Oral Daily  . calcium-vitamin D  1 tablet Oral Q breakfast  . cholecalciferol  1,000 Units Oral Daily  . desipramine  25 mg Oral Daily  . famotidine  20 mg Oral Daily  . linaclotide  145 mcg Oral QAC breakfast  . LORazepam  1 mg Oral QHS  . metoprolol succinate  12.5 mg Oral QPM  . potassium chloride  10 mEq Oral Daily  . sotalol  160 mg Oral Daily  . torsemide  20 mg Oral Daily  . vitamin B-12  1,000 mcg Oral Daily     OBJECTIVE: Physical Exam: Vitals:   05/31/16 2100 05/31/16 2109 05/31/16 2231 06/01/16 0448  BP: (!) 122/56   (!) 124/58  Pulse: (!) 54  88 (!) 53  Resp: 18  18 18   Temp:  97.4 F (36.3 C)  97.7 F (36.5 C)  TempSrc:  Oral  Oral  SpO2: 97%  96% 98%  Weight:    171 lb 1.6 oz (77.6 kg)  Height:       No intake or output data in the 24 hours ending 06/01/16 3212  Telemetry is reviewed by myself: SB/SR 50's-60's  GEN- The patient is well appearing, alert and oriented x 3 today.   Head- normocephalic, atraumatic Eyes-  Sclera clear, conjunctiva pink Ears- hearing intact Oropharynx- clear Neck- supple, no JVP Lungs- CTA b/l, normal work of breathing Heart- RRR, no significant murmurs, no rubs or gallops GI- soft, NT, ND Extremities- no clubbing, cyanosis, trace edema Skin- no rash or lesion Psych- euthymic mood, full affect Neuro- no gross deficits appreciated  LABS: Basic Metabolic Panel:  Recent Labs  05/31/16 0947 06/01/16 0431  NA 140 139  K 3.8 3.9  CL 102 98*  CO2 28 30  GLUCOSE 146* 145*  BUN 23* 21*  CREATININE 1.04* 1.01*  CALCIUM 9.5 9.2  MG 2.0 2.3     ASSESSMENT AND PLAN:  1. Paroxysmal AFib     CHA2DS2Vasc is at least 5, on Eliquis     K+ 3.9     Mag 2.3     Creat 1.01 (cal Cr. Cl = 53)     QTc  stable Remains in SR Continue daily dosing given creat. Clearance, QT stable, will stop AM metoprolol, continue PM dose, anticipate discharge after tomorrow's dose of sotalol.  2. OSA     Compliant w/CPAP  3. HTN     no need for adjustment  4. DM     Diet controlled  Tommye Standard, PA-C 06/01/2016 8:08 AM  EP Attending  Patient seen and examined. Agree with above. She is stable with initiation of Sotalol for atrial fib. She denies chest pain or sob. No significant arrhythmias on tele and her ECG shows a QT interval which is not appreciably increased on sotalol 160 mg daily. If her QT is ok, will likely dc home tomorrow 2 hours after her dose of Sotalol. She will need a 12 lead ECG as an outpatient in 3-5 days.  Mikle Bosworth.D.

## 2016-06-02 ENCOUNTER — Telehealth: Payer: Self-pay | Admitting: Physician Assistant

## 2016-06-02 LAB — BASIC METABOLIC PANEL
Anion gap: 11 (ref 5–15)
BUN: 34 mg/dL — ABNORMAL HIGH (ref 6–20)
CO2: 29 mmol/L (ref 22–32)
CREATININE: 1.48 mg/dL — AB (ref 0.44–1.00)
Calcium: 9.3 mg/dL (ref 8.9–10.3)
Chloride: 97 mmol/L — ABNORMAL LOW (ref 101–111)
GFR calc Af Amer: 37 mL/min — ABNORMAL LOW (ref >60)
GFR, EST NON AFRICAN AMERICAN: 32 mL/min — AB (ref >60)
GLUCOSE: 136 mg/dL — AB (ref 65–99)
Potassium: 4 mmol/L (ref 3.5–5.1)
Sodium: 137 mmol/L (ref 135–145)

## 2016-06-02 LAB — MAGNESIUM: Magnesium: 2.2 mg/dL (ref 1.7–2.4)

## 2016-06-02 MED ORDER — SOTALOL HCL 160 MG PO TABS: 160.0000 mg | ORAL_TABLET | Freq: Every day | ORAL | 30 refills | Status: DC

## 2016-06-02 MED ORDER — METOPROLOL SUCCINATE ER 25 MG PO TB24: 12.5000 mg | ORAL_TABLET | Freq: Every evening | ORAL | 3 refills | Status: DC

## 2016-06-02 NOTE — Progress Notes (Signed)
Progress Note  Patient Name: Melinda [name] Date of Encounter: 06/02/2016  Primary Cardiologist: Peter Martinique  Subjective   No complaints. Denies chest pain or sob or palpitations.   Inpatient Medications    Scheduled Meds: . amLODipine  5 mg Oral Daily  . apixaban  5 mg Oral BID  . atorvastatin  40 mg Oral Daily  . calcium-vitamin D  1 tablet Oral Q breakfast  . cholecalciferol  1,000 Units Oral Daily  . desipramine  25 mg Oral Daily  . famotidine  20 mg Oral Daily  . linaclotide  145 mcg Oral QAC breakfast  . LORazepam  1 mg Oral QHS  . metoprolol succinate  12.5 mg Oral QPM  . potassium chloride  10 mEq Oral Daily  . sotalol  160 mg Oral Daily  . torsemide  20 mg Oral Daily  . vitamin B-12  1,000 mcg Oral Daily   Continuous Infusions:  PRN Meds: acetaminophen, nitroGLYCERIN, polyethylene glycol, Polyethyl Glycol-Propyl Glycol   Vital Signs    Vitals:   06/01/16 0448 06/01/16 1431 06/01/16 2012 06/02/16 0531  BP: (!) 124/58 (!) 102/50 116/68 111/65  Pulse: (!) 53 (!) 45 (!) 56 62  Resp: 18 18 18 20   Temp: 97.7 F (36.5 C)  97.5 F (36.4 C) 97.8 F (36.6 C)  TempSrc: Oral  Oral Oral  SpO2: 98% 100% 97% 94%  Weight: 171 lb 1.6 oz (77.6 kg)   168 lb 11.2 oz (76.5 kg)  Height:        Intake/Output Summary (Last 24 hours) at 06/02/16 0755 Last data filed at 06/01/16 0900  Gross per 24 hour  Intake              240 ml  Output                0 ml  Net              240 ml   Filed Weights   05/31/16 1200 06/01/16 0448 06/02/16 0531  Weight: 171 lb 11.2 oz (77.9 kg) 171 lb 1.6 oz (77.6 kg) 168 lb 11.2 oz (76.5 kg)    Telemetry    nsr - Personally Reviewed  ECG    nsr with satisfactory QTC - Personally Reviewed  Physical Exam   GEN: overweight but No acute distress.   Neck: 6 cm JVD Cardiac: RRR, no murmurs, rubs, or gallops.  Respiratory: Clear to auscultation bilaterally. GI: Soft, nontender, non-distended  MS: No edema; No  deformity. Neuro:  Nonfocal  Psych: Normal affect   Labs    Chemistry Recent Labs Lab 05/31/16 0947 06/01/16 0431 06/02/16 0240  NA 140 139 137  K 3.8 3.9 4.0  CL 102 98* 97*  CO2 28 30 29   GLUCOSE 146* 145* 136*  BUN 23* 21* 34*  CREATININE 1.04* 1.01* 1.48*  CALCIUM 9.5 9.2 9.3  GFRNONAA 49* 50* 32*  GFRAA 56* 58* 37*  ANIONGAP 10 11 11      HematologyNo results for input(s): WBC, RBC, HGB, HCT, MCV, MCH, MCHC, RDW, PLT in the last 168 hours.  Cardiac EnzymesNo results for input(s): TROPONINI in the last 168 hours. No results for input(s): TROPIPOC in the last 168 hours.   BNPNo results for input(s): BNP, PROBNP in the last 168 hours.   DDimer No results for input(s): DDIMER in the last 168 hours.   Radiology    No results found.  Cardiac Studies   none  Patient Profile  83 y.o. female admitted for initiation of sotalol  Assessment & Plan    1. PAF - she is maintaining NSR on sotalol and feels well. Her QT is ok so far. 2. Sinus bradycardia - she has had some on sotalol and low dose metoprolol. I considered stopping the metoprolol but as she is asymptomatic and sedentary, I will continue bot sotalol and metoprolol. 3. CAD - she is asymptomatic 4. HTN - stable.  5. Disp. - will plan to dc home later today if QT is ok. She will need to return early next week for a 12 lead ecg in the office. Long term followup with Dr. Martinique.   Signed, [name], MD  06/02/2016, 7:55 AM  Patient ID: [name], female   DOB: 20-Jan-1932, 83 y.o.   MRN: [id number]

## 2016-06-02 NOTE — Care Management Note (Addendum)
Case Management Note Marvetta Gibbons RN, BSN Unit 2W-Case Manager [phone]  Patient Details  Name: Melinda [name] MRN: [id number] Date of Birth: 1931/09/02  Subjective/Objective:   Pt admitted with afib for sotalol load                 Action/Plan: PTA pt lived at home alone-independent- anticipate return home- CM to follow  Expected Discharge Date:                  Expected Discharge Plan:  Home/Self Care  In-House Referral:     Discharge planning Services  CM Consult  Post Acute Care Choice:  NA Choice offered to:  NA  DME Arranged:    DME Agency:     HH Arranged:    [place] Agency:     Status of Service:  In process, will continue to follow  If discussed at Long Length of Stay Meetings, dates discussed:    Additional Comments:  Dawayne Patricia, RN 06/02/2016, 12:15 PM

## 2016-06-02 NOTE — Discharge Instructions (Signed)

## 2016-06-02 NOTE — Progress Notes (Signed)
Order received to discharge patient.  Telemetry monitor removed and CCMD notified.  PIV access removed.  Discharge instructions, follow up, medications and instructions for their use were discussed with patient. 

## 2016-06-02 NOTE — Telephone Encounter (Signed)
I noticed the patient was discharged without having included her Sotalol on her discharge medicines.  I updated her medicine list and ensured the pharmacy had it in their system.  I spoke with the patient and she was able to report to me the medicine name and instructions.  She wrote it in on her home list of medicines and will be picking up her sotalol tomorrow morning.  She understands the Sotalol is in the morning her metoprolol 12.5mg  is evening.  Tommye Standard, PA-C

## 2016-06-07 ENCOUNTER — Other Ambulatory Visit (HOSPITAL_COMMUNITY): Payer: Self-pay | Admitting: *Deleted

## 2016-06-07 ENCOUNTER — Ambulatory Visit (HOSPITAL_COMMUNITY)
Admission: RE | Admit: 2016-06-07 | Discharge: 2016-06-07 | Disposition: A | Discharge status: Home / Self Care | Payer: Medicare HMO | Source: Ambulatory Visit | Attending: Nurse Practitioner | Admitting: Nurse Practitioner

## 2016-06-07 ENCOUNTER — Encounter (HOSPITAL_COMMUNITY): Payer: Self-pay | Admitting: Nurse Practitioner

## 2016-06-07 VITALS — BP 136/64 | HR 61 | Ht 60.0 in | Wt 173.0 lb

## 2016-06-07 DIAGNOSIS — G4730 Sleep apnea, unspecified: Secondary | ICD-10-CM | POA: Diagnosis not present

## 2016-06-07 DIAGNOSIS — K219 Gastro-esophageal reflux disease without esophagitis: Secondary | ICD-10-CM | POA: Diagnosis not present

## 2016-06-07 DIAGNOSIS — I119 Hypertensive heart disease without heart failure: Secondary | ICD-10-CM | POA: Insufficient documentation

## 2016-06-07 DIAGNOSIS — Z96641 Presence of right artificial hip joint: Secondary | ICD-10-CM | POA: Insufficient documentation

## 2016-06-07 DIAGNOSIS — Z955 Presence of coronary angioplasty implant and graft: Secondary | ICD-10-CM | POA: Diagnosis not present

## 2016-06-07 DIAGNOSIS — Z780 Asymptomatic menopausal state: Secondary | ICD-10-CM | POA: Diagnosis not present

## 2016-06-07 DIAGNOSIS — I480 Paroxysmal atrial fibrillation: Secondary | ICD-10-CM | POA: Diagnosis not present

## 2016-06-07 DIAGNOSIS — G4700 Insomnia, unspecified: Secondary | ICD-10-CM | POA: Insufficient documentation

## 2016-06-07 DIAGNOSIS — Z8249 Family history of ischemic heart disease and other diseases of the circulatory system: Secondary | ICD-10-CM | POA: Diagnosis not present

## 2016-06-07 DIAGNOSIS — Z7902 Long term (current) use of antithrombotics/antiplatelets: Secondary | ICD-10-CM | POA: Diagnosis not present

## 2016-06-07 DIAGNOSIS — M1990 Unspecified osteoarthritis, unspecified site: Secondary | ICD-10-CM | POA: Insufficient documentation

## 2016-06-07 DIAGNOSIS — Z90710 Acquired absence of both cervix and uterus: Secondary | ICD-10-CM | POA: Diagnosis not present

## 2016-06-07 DIAGNOSIS — Z888 Allergy status to other drugs, medicaments and biological substances status: Secondary | ICD-10-CM | POA: Insufficient documentation

## 2016-06-07 DIAGNOSIS — E7800 Pure hypercholesterolemia, unspecified: Secondary | ICD-10-CM | POA: Insufficient documentation

## 2016-06-07 DIAGNOSIS — I491 Atrial premature depolarization: Secondary | ICD-10-CM | POA: Diagnosis not present

## 2016-06-07 DIAGNOSIS — Z87440 Personal history of urinary (tract) infections: Secondary | ICD-10-CM | POA: Diagnosis not present

## 2016-06-07 DIAGNOSIS — I272 Pulmonary hypertension, unspecified: Secondary | ICD-10-CM | POA: Diagnosis not present

## 2016-06-07 DIAGNOSIS — Z833 Family history of diabetes mellitus: Secondary | ICD-10-CM | POA: Insufficient documentation

## 2016-06-07 DIAGNOSIS — Z800 Family history of malignant neoplasm of digestive organs: Secondary | ICD-10-CM | POA: Diagnosis not present

## 2016-06-07 DIAGNOSIS — E119 Type 2 diabetes mellitus without complications: Secondary | ICD-10-CM | POA: Diagnosis not present

## 2016-06-07 DIAGNOSIS — I2510 Atherosclerotic heart disease of native coronary artery without angina pectoris: Secondary | ICD-10-CM | POA: Insufficient documentation

## 2016-06-07 DIAGNOSIS — Z8051 Family history of malignant neoplasm of kidney: Secondary | ICD-10-CM | POA: Insufficient documentation

## 2016-06-07 DIAGNOSIS — Z885 Allergy status to narcotic agent status: Secondary | ICD-10-CM | POA: Diagnosis not present

## 2016-06-07 LAB — BASIC METABOLIC PANEL
Anion gap: 8 (ref 5–15)
BUN: 18 mg/dL (ref 6–20)
CALCIUM: 9.4 mg/dL (ref 8.9–10.3)
CO2: 28 mmol/L (ref 22–32)
Chloride: 104 mmol/L (ref 101–111)
Creatinine, Ser: 1.03 mg/dL — ABNORMAL HIGH (ref 0.44–1.00)
GFR calc Af Amer: 57 mL/min — ABNORMAL LOW (ref >60)
GFR, EST NON AFRICAN AMERICAN: 49 mL/min — AB (ref >60)
GLUCOSE: 180 mg/dL — AB (ref 65–99)
Potassium: 3.8 mmol/L (ref 3.5–5.1)
Sodium: 140 mmol/L (ref 135–145)

## 2016-06-07 LAB — MAGNESIUM: Magnesium: 2.1 mg/dL (ref 1.7–2.4)

## 2016-06-07 MED ORDER — POTASSIUM CHLORIDE ER 10 MEQ PO TBCR: 20.0000 meq | EXTENDED_RELEASE_TABLET | Freq: Every day | ORAL | 6 refills | Status: DC

## 2016-06-07 NOTE — Progress Notes (Signed)
Primary Care Physician: Jerlyn Ly, MD Referring Physician: [facility] ER f/u Cardiologist: Dr. Martinique   Melinda [name] is a 83 y.o. female with a h/o HTN, HLD, CAD, DM type II, PAF on Eliquis; who presented to [facility] ER, 12/24, with a reportedseveral month history of intermittent shortness of breath that acutely worsened over the last few days. She previously noticed shortness of breath symptoms when significantly exerting herself such as walking upstairs or walking long distances. However,over the last few days prior to admission, she could not walk even 2-3 steps without having to pause to catch her breath. States that she's only on oxygen with her CPAP mask at night. Associated symptoms included fatigue, 2 pillow orthopnea, lower extremity swelling, wheezing,dry cough, and chills. She was diagnosed with pneumonia, with hypoxia and acute on chronic diastolic dysfunction with afib with RVR and  Patient just underwent cardioversion, 12/20, for atrial fibrillation 4 days ago. This was her second time having this procedure done, previous was in May of this year.She reports taking her Eliquis regularly.   She's had no other recent changes to her medication regimen.    She left the hospital 12/24 and was in [place]. She was in the kitchen 1/4 and started having chest/neck/jaw pain. She was taken back to the ER and found to be back in afib with rvr.. She was discharged to home with f/u in the afib clinic. She is feeling at her baseline today and afib is controlled.  HR at home is controlled by reviewing pt's HR/BP log.. When afib first returned, her rates were higher. Echo shows severely enlarged rt/left atrium and with severe pulmonary HTN.   She returns to afib clinic 05/14/16 to further discuss antiarrythmic's. She stated when I saw her last, after antiarrythmic's were discussed in detail, that she was tolerating rate controlled afib and deferred AAD therapy. However, by the time that she f/u with Dr.  Martinique, she spontaneously converted to SR, and realized that she did feel much better. Dr. Martinique as well wanted her to stay in [place] and felt she would require an antiarrythmic. She continues in SR today and is back to discuss AAD therapy.   Returns to [place] clinic 3/26 for planned admission for sotalol. She continues in afib but is very symptomatic when afib occurs  with RVR. She did not take any am meds but a dose of eliquis  5 mg was given to prevent interruption of DOAC. Labs show a K+ of 3.8 and mag of 2.0. Crcl cal is 51.66.  F/u afib clinic. She is post hospitalization x one week for sotalol loading. D/C on 160 mg daily. She is staying in SR. QTc 374 ms. She feels well. Bmet, mag drawn.  Today, she denies symptoms of palpitations, chest pain,orthopnea, PND, lower extremity edema, dizziness, presyncope, syncope, or neurologic sequela. Chronic shortness of breath The patient is tolerating medications without difficulties and is otherwise without complaint today.   Past Medical History:  Diagnosis Date  . Acid reflux   . Anxiety   . Arthritis   . Cataract    surgery,bilateral  . Constipation   . Depression   . Diabetes mellitus without complication ([place])   . Dyspnea on exertion    a. 07/2015 Echo: EF 55-60%, no rwma, mild MR, mod dil LA/RA/RV, mod TR, triv PR, PASP 72mmHg.  . Family history of adverse reaction to anesthesia    mother always had n/v  . Hx: UTI (urinary tract infection)   . Hypercholesterolemia   .  Hypertensive heart disease   . Insomnia   . Multifactorial gait disorder   . Non-obstructive CAD    a. 07/2015 Cath: LM 30-40d, LAD min irregs, LCX min irregs, RCA 30p, RA 17, RV 33/7, PA 42/19, PCWP 17.  . Osteoarthritis    a. 06/2015 s/p R total hip arthroplasty.  [name] PAF (paroxysmal atrial fibrillation) ([place])    a. Dx 07/2015, CHA2DS2VASc = 6-->Eliquis.  . Premature atrial contractions   . Pulmonary hypertension    a. 07/2015 Echo: PASP 8mmHg.  [name] Pyloric antral stenosis     in childhood,infancy  . Sleep apnea with use of continuous positive airway pressure (CPAP) 10/19/2012   a. compliant w/ CPAP.   Past Surgical History:  Procedure Laterality Date  . ABDOMINAL HYSTERECTOMY  1970  . ABDOMINAL SURGERY     as a 59 day old baby  . CARDIAC CATHETERIZATION N/A 07/14/2015   Procedure: Right/Left Heart Cath and Coronary Angiography;  Surgeon: Sanda Klein, MD;  Location: [place] CV LAB;  Service: Cardiovascular;  Laterality: N/A;  . CARDIOVERSION N/A 11/04/2015   Procedure: CARDIOVERSION;  Surgeon: Lelon Perla, MD;  Location: [facility] ENDOSCOPY;  Service: Cardiovascular;  Laterality: N/A;  . CARDIOVERSION N/A 02/25/2016   Procedure: CARDIOVERSION;  Surgeon: Pixie Casino, MD;  Location: [facility] ENDOSCOPY;  Service: Cardiovascular;  Laterality: N/A;  . COLONOSCOPY    . EYE SURGERY Bilateral    cataract surgery with lens implant  . INNER EAR SURGERY Left    x 3  . LAPAROSCOPIC SALPINGO OOPHERECTOMY    . NM MYOVIEW LTD  11/30/2007   normal stress nuclear study/EF-83%  . TONSILLECTOMY    . TOTAL HIP ARTHROPLASTY Right 06/26/2015   Procedure: RIGHT TOTAL HIP ARTHROPLASTY ANTERIOR APPROACH;  Surgeon: Leandrew Koyanagi, MD;  Location: [place];  Service: Orthopedics;  Laterality: Right;  . TRANSTHORACIC ECHOCARDIOGRAM  07/23/2009   mild concentric LVH/mild mitral insufficiency/ moderate tricuspid innsufficiency/mild pulmonary HTN/EF- 55-60%  . VAGINAL DELIVERY      Current Outpatient Prescriptions  Medication Sig Dispense Refill  . acetaminophen (TYLENOL) 650 MG CR tablet Take 650-1,300 mg by mouth 2 (two) times daily as needed for pain (arthritis pain).     [name] amLODipine (NORVASC) 5 MG tablet Take 5 mg by mouth daily.    [name] apixaban (ELIQUIS) 5 MG TABS tablet Take 1 tablet (5 mg total) by mouth 2 (two) times daily. 60 tablet 0  . atorvastatin (LIPITOR) 40 MG tablet Take 40 mg by mouth daily.     . Calcium Carbonate-Vitamin D3 (CALCIUM 600+D3) 600-400 MG-UNIT TABS Take 1 tablet  by mouth daily.    . cholecalciferol (VITAMIN D) 1000 UNITS tablet Take 1,000 Units by mouth daily.     [name] desipramine (NORPRAMIN) 25 MG tablet Take 25 mg by mouth daily.    . famotidine (PEPCID) 20 MG tablet Take 20 mg by mouth daily.    [name] glucosamine-chondroitin 500-400 MG tablet Take 1 tablet by mouth every other day.     . linaclotide (LINZESS) 145 MCG CAPS capsule Take 145 mcg by mouth daily before breakfast.     . LORazepam (ATIVAN) 0.5 MG tablet Take 1 mg by mouth at bedtime.   5  . metoprolol succinate (TOPROL-XL) 25 MG 24 hr tablet Take 0.5 tablets (12.5 mg total) by mouth every evening. 15 tablet 3  . Multiple Vitamins-Minerals (OCUVITE PO) Take 1 tablet by mouth daily.    [name] Glycol-Propyl Glycol (SYSTANE) 0.4-0.3 % GEL ophthalmic gel Place  1 application into both eyes at bedtime as needed (dry eyes).    . polyethylene glycol (MIRALAX / GLYCOLAX) packet Take 17 g by mouth daily. Mix in 8 oz liquid and drink    . potassium chloride (K-DUR) 10 MEQ tablet Take 1 tablet (10 mEq total) by mouth daily. 30 tablet 6  . PRESCRIPTION MEDICATION Inhale into the lungs at bedtime. Uses CPAP with oxygen    . sotalol (BETAPACE) 160 MG tablet Take 1 tablet (160 mg total) by mouth daily. Take in the morning 30 tablet 30  . torsemide (DEMADEX) 10 MG tablet Take 1 tablet (10 mg total) by mouth daily. (Patient taking differently: Take 20 mg by mouth daily. ) 30 tablet 0  . vitamin B-12 (CYANOCOBALAMIN) 1000 MCG tablet Take 1,000 mcg by mouth daily.     No current facility-administered medications for this encounter.     Allergies  Allergen Reactions  . Lasix [Furosemide] Itching  . Phenobarbital Itching  . Amitiza [Lubiprostone] Itching    Social History   Social History  . Marital status: Married    Spouse name: N/A  . Number of children: 1  . Years of education: 16   Occupational History  . Retired    Social History Main Topics  . Smoking status: Never Smoker  . Smokeless  tobacco: Never Used  . Alcohol use No     Comment: rare  . Drug use: No  . Sexual activity: Not on file   Other Topics Concern  . Not on file   Social History Narrative   Patient lives alone.    Patient is widowed.    Patient retired.    Patient some college.    Patient has one child.    Caffeine 1-2 cups daily avg.    Family History  Problem Relation Age of Onset  . Heart attack Father   . Heart failure Mother   . Cancer Mother     colon,kidney  . Diabetes Sister   . Hypertension Sister   . Heart failure Sister     ROS- All systems are reviewed and negative except as per the HPI above  Physical Exam: Vitals:   06/07/16 1021  BP: 136/64  Pulse: 61  Weight: 173 lb (78.5 kg)  Height: 5' (1.524 m)   Wt Readings from Last 3 Encounters:  06/07/16 173 lb (78.5 kg)  06/02/16 168 lb 11.2 oz (76.5 kg)  05/31/16 173 lb 9.6 oz (78.7 kg)    Labs: Lab Results  Component Value Date   NA 137 06/02/2016   K 4.0 06/02/2016   CL 97 (L) 06/02/2016   CO2 29 06/02/2016   GLUCOSE 136 (H) 06/02/2016   BUN 34 (H) 06/02/2016   CREATININE 1.48 (H) 06/02/2016   CALCIUM 9.3 06/02/2016   MG 2.2 06/02/2016   Lab Results  Component Value Date   INR 1.1 10/29/2015   No results found for: CHOL, HDL, LDLCALC, TRIG   GEN- The patient is well appearing, alert and oriented x 3 today.   Head- normocephalic, atraumatic Eyes-  Sclera clear, conjunctiva pink Ears- hearing intact Oropharynx- clear Neck- supple, no JVP Lymph- no cervical lymphadenopathy Lungs- Clear to ausculation bilaterally, normal work of breathing Heart- Regular rate and rhythm, no murmurs, rubs or gallops, PMI not laterally displaced GI- soft, NT, ND, + BS Extremities- no clubbing, cyanosis, or edema MS- no significant deformity or atrophy Skin- no rash or lesion Psych- euthymic mood, full affect Neuro- strength and sensation are  intact  EKG- NSR at 61 bpm, pr int 126 ms, qrs int 104 ms, qtc 374  ms Echo-Study Conclusions  - Left ventricle: The cavity size was normal. Systolic function was   normal. The estimated ejection fraction was in the range of 60%   to 65%. Wall motion was normal; there were no regional wall   motion abnormalities. The study is not technically sufficient to   allow evaluation of LV diastolic function. - Aortic valve: Transvalvular velocity was within the normal range.   There was no stenosis. There was no regurgitation. - Mitral valve: Calcified annulus. Transvalvular velocity was   within the normal range. There was no evidence for stenosis.   There was moderate regurgitation directed centrally. - Left atrium: The atrium was severely dilated. - Right ventricle: The cavity size was normal. Wall thickness was   normal. Systolic function was normal. - Right atrium: The atrium was severely dilated. - Tricuspid valve: There was moderate regurgitation. - Pulmonic valve: There was moderate regurgitation. - Pulmonary arteries: Systolic pressure was severely increased. PA   peak pressure: 71 mm Hg (S). - Pericardium, extracardiac: A small pericardial effusion was   identified. Features were not consistent with tamponade   physiology.   Assessment and Plan: 1. H/o persistent symptomatic afib Pt has had successful cardioversion's in recent past(dec/jan) but with ERAF She spontaneously returned to SR and feels better and would like to stay in SR if possible Pt could not afford tikoyn, it would a tier 4 drug for her She was not excited re amiodarone and potential side effects With h/o of diastolic heart failure, multaq was not  the best option and she feels that she may not be able to afford, usually running around 80$ a month in the medicare population Pt was successfully admitted for sotalol Staying in SR BMET/mag today  F/u with Dr. Martinique 4/9 as scheduled  afib clinic as needed  [place]. Valecia Beske, [facility] [address] [place], [place] [address] [phone]

## 2016-06-11 ENCOUNTER — Ambulatory Visit (HOSPITAL_COMMUNITY): Payer: Medicare HMO | Admitting: Nurse Practitioner

## 2016-06-14 DIAGNOSIS — Z6833 Body mass index (BMI) 33.0-33.9, adult: Secondary | ICD-10-CM | POA: Diagnosis not present

## 2016-06-14 DIAGNOSIS — Z1389 Encounter for screening for other disorder: Secondary | ICD-10-CM | POA: Diagnosis not present

## 2016-06-14 DIAGNOSIS — I10 Essential (primary) hypertension: Secondary | ICD-10-CM | POA: Diagnosis not present

## 2016-06-14 DIAGNOSIS — I480 Paroxysmal atrial fibrillation: Secondary | ICD-10-CM | POA: Diagnosis not present

## 2016-06-14 DIAGNOSIS — M859 Disorder of bone density and structure, unspecified: Secondary | ICD-10-CM | POA: Diagnosis not present

## 2016-06-22 ENCOUNTER — Encounter (INDEPENDENT_AMBULATORY_CARE_PROVIDER_SITE_OTHER): Payer: Self-pay | Admitting: Orthopaedic Surgery

## 2016-06-22 ENCOUNTER — Ambulatory Visit (INDEPENDENT_AMBULATORY_CARE_PROVIDER_SITE_OTHER): Payer: Medicare HMO

## 2016-06-22 ENCOUNTER — Ambulatory Visit (INDEPENDENT_AMBULATORY_CARE_PROVIDER_SITE_OTHER): Payer: Medicare HMO | Admitting: Orthopaedic Surgery

## 2016-06-22 DIAGNOSIS — M25551 Pain in right hip: Secondary | ICD-10-CM

## 2016-06-22 DIAGNOSIS — Z96641 Presence of right artificial hip joint: Secondary | ICD-10-CM

## 2016-06-22 NOTE — Progress Notes (Signed)
Office Visit Note   Patient: Melinda [name]           Date of Birth: 25-Apr-1931           MRN: [id number] Visit Date: 06/22/2016              Requested by: Crist Infante, MD [address] [place], [place] [address] PCP: Jerlyn Ly, MD   Assessment & Plan: Visit Diagnoses:  1. Pain in right hip   2. Status post right hip replacement     Plan: Impression is iliopsoas strain. Recommended giving it more time and resting it. She is Eliquis so therefore should not take NSAIDs. Her hip implant is stable. I reminded her of the abx prophylaxis. Follow-up in a year with repeat AP pelvis.  Follow-Up Instructions: Return in about 1 year (around 06/22/2017).   Orders:  Orders Placed This Encounter  Procedures  . XR Pelvis 1-2 Views   No orders of the defined types were placed in this encounter.     Procedures: No procedures performed   Clinical Data: No additional findings.   Subjective: Chief Complaint  Patient presents with  . Right Hip - Pain    Patient is one year status post right total hip replacement. She is doing well from that standpoint. She strained her hip while try and get into a lawnmower. She is growing pain. She is also been recently diagnosed with atrial fibrillation and she is on Eliquis. Otherwise she is doing well.    Review of Systems   Objective: Vital Signs: There were no vitals taken for this visit.  Physical Exam  Ortho Exam Right hip exam shows painless range of motion. She has pain with resisted straight leg raise. This localizes to the groin. Specialty Comments:  No specialty comments available.  Imaging: Xr Pelvis 1-2 Views  Result Date: 06/22/2016 Stable right total hip replacement in good alignment    PMFS History: Patient Active Problem List   Diagnosis Date Noted  . Pain in right hip 06/22/2016  . Encounter for monitoring sotalol therapy 05/31/2016  . Chest pain   . CHF exacerbation ([place]) 02/29/2016  . Acute  respiratory failure with hypoxia ([place]) 02/29/2016  . Persistent atrial fibrillation ([place])   . Hypertensive heart disease   . Hypercholesterolemia   . Pulmonary hypertension ([place])   . Dyspnea on exertion   . Non-obstructive CAD   . Chest pain, moderate coronary artery risk 07/14/2015  . Pain in the chest   . PAF (paroxysmal atrial fibrillation) ([facility])   . Pulmonary arterial hypertension ([place])   . Chest pain with moderate risk for cardiac etiology 07/08/2015  . Dyspnea 07/08/2015  . Essential hypertension 07/08/2015  . Hyperlipidemia 07/08/2015  . Osteoarthritis of right hip 06/26/2015  . Hip joint replacement status 06/26/2015  . OSA on CPAP 03/27/2015  . Ataxia 03/27/2015  . Falls 03/27/2015  . Insomnia, controlled 03/27/2015  . Insomnia with sleep apnea 03/22/2014  . Adjustment disorder with mixed anxiety and depressed mood 03/22/2014  . Sleep apnea with use of continuous positive airway pressure (CPAP) 10/19/2012  . Multifactorial gait disorder    Past Medical History:  Diagnosis Date  . Acid reflux   . Anxiety   . Arthritis   . Cataract    surgery,bilateral  . Constipation   . Depression   . Diabetes mellitus without complication ([place])   . Dyspnea on exertion    a. 07/2015 Echo: EF 55-60%, no rwma, mild MR, mod dil  LA/RA/RV, mod TR, triv PR, PASP 39mmHg.  . Family history of adverse reaction to anesthesia    mother always had n/v  . Hx: UTI (urinary tract infection)   . Hypercholesterolemia   . Hypertensive heart disease   . Insomnia   . Multifactorial gait disorder   . Non-obstructive CAD    a. 07/2015 Cath: LM 30-40d, LAD min irregs, LCX min irregs, RCA 30p, RA 17, RV 33/7, PA 42/19, PCWP 17.  . Osteoarthritis    a. 06/2015 s/p R total hip arthroplasty.  [name] PAF (paroxysmal atrial fibrillation) ([place])    a. Dx 07/2015, CHA2DS2VASc = 6-->Eliquis.  . Premature atrial contractions   . Pulmonary hypertension ([place])    a. 07/2015 Echo: PASP 32mmHg.  [name] Pyloric antral  stenosis    in childhood,infancy  . Sleep apnea with use of continuous positive airway pressure (CPAP) 10/19/2012   a. compliant w/ CPAP.    Family History  Problem Relation Age of Onset  . Heart attack Father   . Heart failure Mother   . Cancer Mother     colon,kidney  . Diabetes Sister   . Hypertension Sister   . Heart failure Sister     Past Surgical History:  Procedure Laterality Date  . ABDOMINAL HYSTERECTOMY  1970  . ABDOMINAL SURGERY     as a 44 day old baby  . CARDIAC CATHETERIZATION N/A 07/14/2015   Procedure: Right/Left Heart Cath and Coronary Angiography;  Surgeon: Sanda Klein, MD;  Location: [facility];  Service: Cardiovascular;  Laterality: N/A;  . CARDIOVERSION N/A 11/04/2015   Procedure: CARDIOVERSION;  Surgeon: Lelon Perla, MD;  Location: [facility] ENDOSCOPY;  Service: Cardiovascular;  Laterality: N/A;  . CARDIOVERSION N/A 02/25/2016   Procedure: CARDIOVERSION;  Surgeon: Pixie Casino, MD;  Location: [facility] ENDOSCOPY;  Service: Cardiovascular;  Laterality: N/A;  . COLONOSCOPY    . EYE SURGERY Bilateral    cataract surgery with lens implant  . INNER EAR SURGERY Left    x 3  . LAPAROSCOPIC SALPINGO OOPHERECTOMY    . NM MYOVIEW LTD  11/30/2007   normal stress nuclear study/EF-83%  . TONSILLECTOMY    . TOTAL HIP ARTHROPLASTY Right 06/26/2015   Procedure: RIGHT TOTAL HIP ARTHROPLASTY ANTERIOR APPROACH;  Surgeon: Leandrew Koyanagi, MD;  Location: [place];  Service: Orthopedics;  Laterality: Right;  . TRANSTHORACIC ECHOCARDIOGRAM  07/23/2009   mild concentric LVH/mild mitral insufficiency/ moderate tricuspid innsufficiency/mild pulmonary HTN/EF- 55-60%  . VAGINAL DELIVERY     Social History   Occupational History  . Retired    Social History Main Topics  . Smoking status: Never Smoker  . Smokeless tobacco: Never Used  . Alcohol use No     Comment: rare  . Drug use: No  . Sexual activity: Not on file

## 2016-06-23 ENCOUNTER — Other Ambulatory Visit: Payer: Self-pay | Admitting: Physician Assistant

## 2016-07-02 DIAGNOSIS — R0902 Hypoxemia: Secondary | ICD-10-CM | POA: Diagnosis not present

## 2016-07-02 DIAGNOSIS — G4733 Obstructive sleep apnea (adult) (pediatric): Secondary | ICD-10-CM | POA: Diagnosis not present

## 2016-07-02 DIAGNOSIS — I5043 Acute on chronic combined systolic (congestive) and diastolic (congestive) heart failure: Secondary | ICD-10-CM | POA: Diagnosis not present

## 2016-07-03 NOTE — Progress Notes (Signed)
Cardiology Office Note   Date:  07/05/2016   ID:  MIKINZIE MACIEJEWSKI, DOB October 04, 1931, MRN [id number]  PCP:  Jerlyn Ly, MD  Cardiologist:   Peter Martinique, MD   Chief Complaint  Patient presents with  . Follow-up    2 months  . Headache  . Atrial Fibrillation      History of Present Illness: Melinda [name] is a 83 y.o. female who presents for follow up of Afib and dyspnea.  She was seen by me in 2009. At that time she had a normal stress Myoview study. Echo showed mild LVH with normal EF. Mild pulmonary HTN.   She was seen in early May 2017 with symptoms of increased dyspnea on exertion. No cough, wheezing, edema. Weight has been stable.  On one occasion she walked to her mailbox and developed a heavy weight on her chest. She is s/p a right THR. She does have a history of OSA and is followed by Dr. Brett Fairy and using CPAP.    After my visit in early May 2017she was scheduled for a repeat Echo and a Myoview study. Before these could be done she was admitted May 7-9 with acute chest pain. She was found to be in Atrial flutter with controlled rate (new since April). She ruled out for MI. Echo showed normal LV function with mild LVH, mild MR, moderate LA, RA, and RV enlargement with pulmonary HTN estimated at 50 mm Hg. She underwent right and left heart cath that showed nonobstructive CAD, mild pulmonary HTN- more consistent with pulmonary arterial hypertension. CT of the chest was normal with no evidence of PE. She had subsequent PFTs as an outpatient showing normal volumes with mild obstruction and decreased diffusion capacity. She was started on diuretic therapy and anticoagulated. She underwent successful DCCV on November 04, 2015.   When seen again in November 2017 she had recurrent Afib. Beta blocker dose was increased and she had repeat DCCV on December 20. She was admitted 4 days later with hypoxia and acute respiratory distress. ? LLL PNA. Treated with antibiotics and gently  diuresed. Echo showed normal LV function. She had severe biatrial enlargement and pulmonary HTN. Seen back in ED on 03/11/16 with recurrent Afib. Rate controlled. CXR showed no infiltrates. CT chest without acute findings.  Seen for follow up in Afib clinic. It was felt that she was not a candidate for Tikosyn due to cost. Tier 4. Due to the fact that she was significantly symptomatic with Afib she was admitted in March for initiation of sotalol therapy. DC on 160 mg bid.  She is still on eliquis.  On follow up today she states she is doing much better. States she has no Afib since starting sotalol.  Breathing is improved. She still notes dyspnea if she gets in a hurry. Doesn't do any regular walking but does work in her flower beds. She is only taking 12.5 mg Toprol XL now.  Edema is better on torsemide 20 mg daily. No bleeding problems.   Past Medical History:  Diagnosis Date  . Acid reflux   . Anxiety   . Arthritis   . Cataract    surgery,bilateral  . Constipation   . Depression   . Diabetes mellitus without complication ([place])   . Dyspnea on exertion    a. 07/2015 Echo: EF 55-60%, no rwma, mild MR, mod dil LA/RA/RV, mod TR, triv PR, PASP 64mmHg.  . Family history of adverse reaction to anesthesia  mother always had n/v  . Hx: UTI (urinary tract infection)   . Hypercholesterolemia   . Hypertensive heart disease   . Insomnia   . Multifactorial gait disorder   . Non-obstructive CAD    a. 07/2015 Cath: LM 30-40d, LAD min irregs, LCX min irregs, RCA 30p, RA 17, RV 33/7, PA 42/19, PCWP 17.  . Osteoarthritis    a. 06/2015 s/p R total hip arthroplasty.  [name] PAF (paroxysmal atrial fibrillation) ([place])    a. Dx 07/2015, CHA2DS2VASc = 6-->Eliquis.  . Premature atrial contractions   . Pulmonary hypertension ([place])    a. 07/2015 Echo: PASP 44mmHg.  [name] Pyloric antral stenosis    in childhood,infancy  . Sleep apnea with use of continuous positive airway pressure (CPAP) 10/19/2012   a. compliant w/ CPAP.      Past Surgical History:  Procedure Laterality Date  . ABDOMINAL HYSTERECTOMY  1970  . ABDOMINAL SURGERY     as a 56 day old baby  . CARDIAC CATHETERIZATION N/A 07/14/2015   Procedure: Right/Left Heart Cath and Coronary Angiography;  Surgeon: Sanda Klein, MD;  Location: [facility];  Service: Cardiovascular;  Laterality: N/A;  . CARDIOVERSION N/A 11/04/2015   Procedure: CARDIOVERSION;  Surgeon: Lelon Perla, MD;  Location: [facility] ENDOSCOPY;  Service: Cardiovascular;  Laterality: N/A;  . CARDIOVERSION N/A 02/25/2016   Procedure: CARDIOVERSION;  Surgeon: Pixie Casino, MD;  Location: [facility] ENDOSCOPY;  Service: Cardiovascular;  Laterality: N/A;  . COLONOSCOPY    . EYE SURGERY Bilateral    cataract surgery with lens implant  . INNER EAR SURGERY Left    x 3  . LAPAROSCOPIC SALPINGO OOPHERECTOMY    . NM MYOVIEW LTD  11/30/2007   normal stress nuclear study/EF-83%  . TONSILLECTOMY    . TOTAL HIP ARTHROPLASTY Right 06/26/2015   Procedure: RIGHT TOTAL HIP ARTHROPLASTY ANTERIOR APPROACH;  Surgeon: Leandrew Koyanagi, MD;  Location: [facility];  Service: Orthopedics;  Laterality: Right;  . TRANSTHORACIC ECHOCARDIOGRAM  07/23/2009   mild concentric LVH/mild mitral insufficiency/ moderate tricuspid innsufficiency/mild pulmonary HTN/EF- 55-60%  . VAGINAL DELIVERY       Current Outpatient Prescriptions  Medication Sig Dispense Refill  . acetaminophen (TYLENOL) 650 MG CR tablet Take 650-1,300 mg by mouth 2 (two) times daily as needed for pain (arthritis pain).     [name] amLODipine (NORVASC) 5 MG tablet Take 5 mg by mouth daily.    [name] apixaban (ELIQUIS) 5 MG TABS tablet Take 1 tablet (5 mg total) by mouth 2 (two) times daily. 60 tablet 0  . atorvastatin (LIPITOR) 40 MG tablet Take 40 mg by mouth daily.     . Calcium Carbonate-Vitamin D3 (CALCIUM 600+D3) 600-400 MG-UNIT TABS Take 1 tablet by mouth daily.    . cholecalciferol (VITAMIN D) 1000 UNITS tablet Take 1,000 Units by mouth daily.     [name] desipramine  (NORPRAMIN) 25 MG tablet Take 25 mg by mouth daily.    . famotidine (PEPCID) 20 MG tablet Take 20 mg by mouth daily.    [name] glucosamine-chondroitin 500-400 MG tablet Take 1 tablet by mouth every other day.     . linaclotide (LINZESS) 145 MCG CAPS capsule Take 145 mcg by mouth daily before breakfast.     . LORazepam (ATIVAN) 0.5 MG tablet Take 1 mg by mouth at bedtime.   5  . metoprolol succinate (TOPROL-XL) 25 MG 24 hr tablet Take 0.5 tablets (12.5 mg total) by mouth every evening. 15 tablet 3  . Multiple Vitamins-Minerals (  OCUVITE PO) Take 1 tablet by mouth daily.    [name] Glycol-Propyl Glycol (SYSTANE) 0.4-0.3 % GEL ophthalmic gel Place 1 application into both eyes at bedtime as needed (dry eyes).    . polyethylene glycol (MIRALAX / GLYCOLAX) packet Take 17 g by mouth daily. Mix in 8 oz liquid and drink    . potassium chloride (K-DUR) 10 MEQ tablet Take 2 tablets (20 mEq total) by mouth daily. 60 tablet 6  . PRESCRIPTION MEDICATION Inhale into the lungs at bedtime. Uses CPAP with oxygen    . sotalol (BETAPACE) 160 MG tablet TAKE 1 TABLET BY MOUTH IN THE MORNING 30 tablet 6  . torsemide (DEMADEX) 10 MG tablet Take 1 tablet (10 mg total) by mouth daily. (Patient taking differently: Take 20 mg by mouth daily. ) 30 tablet 0  . vitamin B-12 (CYANOCOBALAMIN) 1000 MCG tablet Take 1,000 mcg by mouth daily.     No current facility-administered medications for this visit.     Allergies:   Lasix [furosemide]; Phenobarbital; and Amitiza [lubiprostone]    Social History:  The patient  reports that she has never smoked. She has never used smokeless tobacco. She reports that she does not drink alcohol or use drugs.   Family History:  The patient's family history includes Cancer in her mother; Diabetes in her sister; Heart attack in her father; Heart failure in her mother and sister; Hypertension in her sister.    ROS:  Please see the history of present illness.   Otherwise, review of systems are  positive for none.   All other systems are reviewed and negative.    PHYSICAL EXAM: VS:  BP 124/64   Pulse (!) 56   Ht 5' (1.524 m)   Wt 171 lb (77.6 kg)   BMI 33.40 kg/m  , BMI Body mass index is 33.4 kg/m. GEN: Well nourished, well developed, in no acute distress HEENT: normal  Neck: no JVD, carotid bruits, or masses Cardiac: RRR; no murmurs, rubs, or gallops. No edema. Respiratory:  clear to auscultation bilaterally, normal work of breathing GI: soft, nontender, nondistended, + BS MS: no deformity or atrophy  Skin: warm and dry, no rash Ext: No edema Neuro:  Strength and sensation are intact Psych: euthymic mood, full affect   EKG:  EKG is not ordered today.   Recent Labs: 02/29/2016: B Natriuretic Peptide 948.9 03/11/2016: ALT 49; Hemoglobin 13.2; Platelets 224 03/17/2016: TSH 3.780 06/07/2016: BUN 18; Creatinine, Ser 1.03; Magnesium 2.1; Potassium 3.8; Sodium 140    Lipid Panel No results found for: CHOL, TRIG, HDL, CHOLHDL, VLDL, LDLCALC, LDLDIRECT    Wt Readings from Last 3 Encounters:  07/05/16 171 lb (77.6 kg)  06/07/16 173 lb (78.5 kg)  06/02/16 168 lb 11.2 oz (76.5 kg)      Other studies Reviewed:  Labs dated 05/19/16: Cholesterol 170, triglycerides 151, HDL 59, LDL 81. A1c 6.9%. TSH normal.   Echo 03/02/16: Study Conclusions  - Left ventricle: The cavity size was normal. Systolic function was   normal. The estimated ejection fraction was in the range of 60%   to 65%. Wall motion was normal; there were no regional wall   motion abnormalities. The study is not technically sufficient to   allow evaluation of LV diastolic function. - Aortic valve: Transvalvular velocity was within the normal range.   There was no stenosis. There was no regurgitation. - Mitral valve: Calcified annulus. Transvalvular velocity was   within the normal range. There was no evidence for  stenosis.   There was moderate regurgitation directed centrally. - Left atrium: The atrium  was severely dilated. - Right ventricle: The cavity size was normal. Wall thickness was   normal. Systolic function was normal. - Right atrium: The atrium was severely dilated. - Tricuspid valve: There was moderate regurgitation. - Pulmonic valve: There was moderate regurgitation. - Pulmonary arteries: Systolic pressure was severely increased. PA   peak pressure: 71 mm Hg (S). - Pericardium, extracardiac: A small pericardial effusion was   identified. Features were not consistent with tamponade   physiology.  ASSESSMENT AND PLAN:  1. Atrial fibrillation paroxysmal. S/p DCCV x 2 with early return of Afib. Now well controlled so far on Sotalol. Tolerating this well.  Now on Eliquis for anticoagulation. On very low dose metoprolol.  Follow up in Afib clinic April 2 showed NSR with normal QT.   2. Atypical chest pain. Nonobstructive CAD by cath.   3. OSA on CPAP  4. Pulmonary HTN  5. HTN  6. Hyperlipidemia.   7. Dyspnea secondary to #1,3,4, and deconditioning. Recommend she walk some daily to improve her conditioning.    Current medicines are reviewed at length with the patient today.  The patient does not have concerns regarding medicines.  The following changes have been made:  no change  Labs/ tests ordered today include:  No orders of the defined types were placed in this encounter.  Follow up in 6 months.  Signed, Peter Martinique, MD  07/05/2016 11:39 AM    [facility] [address], [place], [name], [address] Phone [phone], Fax [phone]

## 2016-07-05 ENCOUNTER — Encounter: Payer: Self-pay | Admitting: Cardiology

## 2016-07-05 ENCOUNTER — Ambulatory Visit (INDEPENDENT_AMBULATORY_CARE_PROVIDER_SITE_OTHER): Payer: Medicare HMO | Admitting: Cardiology

## 2016-07-05 VITALS — BP 124/64 | HR 56 | Ht 60.0 in | Wt 171.0 lb

## 2016-07-05 DIAGNOSIS — I10 Essential (primary) hypertension: Secondary | ICD-10-CM | POA: Diagnosis not present

## 2016-07-05 DIAGNOSIS — R0609 Other forms of dyspnea: Secondary | ICD-10-CM | POA: Diagnosis not present

## 2016-07-05 DIAGNOSIS — R0600 Dyspnea, unspecified: Secondary | ICD-10-CM

## 2016-07-05 DIAGNOSIS — I480 Paroxysmal atrial fibrillation: Secondary | ICD-10-CM

## 2016-07-05 DIAGNOSIS — Z7901 Long term (current) use of anticoagulants: Secondary | ICD-10-CM | POA: Diagnosis not present

## 2016-07-05 NOTE — Patient Instructions (Addendum)
Continue your current therapy  Work on walking every day to build up your conditioning.  I will see you in 6 months.

## 2016-07-21 ENCOUNTER — Ambulatory Visit (INDEPENDENT_AMBULATORY_CARE_PROVIDER_SITE_OTHER): Payer: Medicare HMO | Admitting: Adult Health

## 2016-07-21 ENCOUNTER — Encounter: Payer: Self-pay | Admitting: Adult Health

## 2016-07-21 VITALS — BP 142/63 | HR 57 | Ht 60.0 in | Wt 170.8 lb

## 2016-07-21 DIAGNOSIS — G4733 Obstructive sleep apnea (adult) (pediatric): Secondary | ICD-10-CM | POA: Diagnosis not present

## 2016-07-21 DIAGNOSIS — Z9989 Dependence on other enabling machines and devices: Secondary | ICD-10-CM | POA: Diagnosis not present

## 2016-07-21 NOTE — Patient Instructions (Signed)
Continue using CPAP nightly If XS small mask continues to leak please call and we will have you come in for a mask refitting If your symptoms worsen or you develop new symptoms please let us know.

## 2016-07-21 NOTE — Progress Notes (Signed)
I agree with the assessment and plan as directed by NP .The patient is known to me .   Lania Zawistowski, MD  

## 2016-07-21 NOTE — Progress Notes (Signed)
PATIENT: Melinda [name] DOB: December 09, 1931  REASON FOR VISIT: follow up- OSA on CPAP HISTORY FROM: patient  HISTORY OF PRESENT ILLNESS: Today 07/21/2016: Melinda [name] is an 83 year old female with a history of obstructive sleep apnea on CPAP. She returns today for a compliance download. Her download indicates that she uses her machine nightly for a compliance of 100%. She uses her machine greater than 4 hours every night. On average she uses her machine 9 hours and 13 minutes. She is on AutoSet with a minimum pressure of 5 cm water pressure  And maximum pressure of 12 cm of water. Her residual AHI is 1.9. She does have a leak in the 95th percentile at 35.8 L/m. She reports that she is currently using nasal pillows. She recently changed the size to an extra-small to see if it works better. She does have some daytime sleepiness however she feels that it is manageable. In the past she was given a prescription of Nuvigil. She states that she only uses Nuvigil if she is going to drive for long distances. She returns today for an evaluation.   HISTORY 05/16/2017Copied From Dr. Edwena Felty notes: after a recent hip replacement surgery on the right side. After completing her rehabilitation and returning home she suffered chest pain the very next day was brought to the local emergency room where a cardiac cause was ruled out. Pulmonary embolism was also not found. The chest and has meanwhile resolved but its cause was never identified. Her atrial fibrillation also acted out and she had frequently atrial fibrillation spells, she is not chronically in A. Fib, but paroxysmally. Based on her history of atrial fibrillation and known obstructive sleep apnea she underwent a new polysomnography on 05/08/2015 the AHI was still 17.4, supine AHI 28.9, and the REM AHI was 52.3. No periodic limb movements were noted that the patient had prolonged desaturations 240 minutes of low oxygen with the nadir being at 74%. She  was in need of a new CPAP machine ASAP alternative treatment such as dental devices or ENT surgery would not treat hypoxemia. 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 During prolonged toxemia there was no drop of muscle tone during REM sleep. On a pin has been used to treat this condition. The study was followed by a CPAP titration on 05/28/2015 9 cm water pressure seemed to work well for the patient. She still had 198 minutes of desaturations during the CPAP titration study. For this reason we will follow her with a pulse oximetry on CPAP. Her compliance data speak of 80% compliance for over 4 hours of use but she uses the machine 93% of all days. She is using AutoSet right now between 5 and 12 cm water average user time is 6 hours and 26 minutes. The residual AHI is 5.3. Please note that unknown events are 2.3 are likely related to air leaks. Air leaks very greatly between severe and minor day by day she is no longer is tired and has noted more energy and more restorative sleep on a new machine the 91st percentile pressure is 10.2 and the is no need to change the settings. However with her history of paroxysmal A. fib this should be a fingertip pulse oximetry to follow while on CPAP, she advised me that she just did this test last Thursday night. I do not have results for it yet.   REVIEW OF SYSTEMS: Out of a complete 14 system review of symptoms, the patient complains only of the  following symptoms, and all other reviewed systems are negative.  Fatigue severity score 39 Epworth sleepiness score 12 Hearing loss, runny nose, shortness of breath, leg swelling, apnea, sleep talking, joint pain, back pain, bruise/bleed easily  ALLERGIES: Allergies  Allergen Reactions  . Lasix [Furosemide] Itching  . Phenobarbital Itching  . Amitiza [Lubiprostone] Itching    HOME MEDICATIONS: Outpatient Medications Prior to Visit  Medication Sig Dispense Refill  . acetaminophen (TYLENOL) 650 MG CR tablet Take  650-1,300 mg by mouth 2 (two) times daily as needed for pain (arthritis pain).     [name] amLODipine (NORVASC) 5 MG tablet Take 5 mg by mouth daily.    [name] apixaban (ELIQUIS) 5 MG TABS tablet Take 1 tablet (5 mg total) by mouth 2 (two) times daily. 60 tablet 0  . atorvastatin (LIPITOR) 40 MG tablet Take 40 mg by mouth daily.     . Calcium Carbonate-Vitamin D3 (CALCIUM 600+D3) 600-400 MG-UNIT TABS Take 1 tablet by mouth daily.    . cholecalciferol (VITAMIN D) 1000 UNITS tablet Take 1,000 Units by mouth daily.     [name] desipramine (NORPRAMIN) 25 MG tablet Take 25 mg by mouth daily.    . famotidine (PEPCID) 20 MG tablet Take 20 mg by mouth daily.    [name] glucosamine-chondroitin 500-400 MG tablet Take 1 tablet by mouth every other day.     . linaclotide (LINZESS) 145 MCG CAPS capsule Take 145 mcg by mouth daily before breakfast.     . LORazepam (ATIVAN) 0.5 MG tablet Take 1 mg by mouth at bedtime.   5  . metoprolol succinate (TOPROL-XL) 25 MG 24 hr tablet Take 0.5 tablets (12.5 mg total) by mouth every evening. 15 tablet 3  . Multiple Vitamins-Minerals (OCUVITE PO) Take 1 tablet by mouth daily.    [name] Glycol-Propyl Glycol (SYSTANE) 0.4-0.3 % GEL ophthalmic gel Place 1 application into both eyes at bedtime as needed (dry eyes).    . polyethylene glycol (MIRALAX / GLYCOLAX) packet Take 17 g by mouth daily. Mix in 8 oz liquid and drink    . potassium chloride (K-DUR) 10 MEQ tablet Take 2 tablets (20 mEq total) by mouth daily. 60 tablet 6  . PRESCRIPTION MEDICATION Inhale into the lungs at bedtime. Uses CPAP with oxygen    . sotalol (BETAPACE) 160 MG tablet TAKE 1 TABLET BY MOUTH IN THE MORNING 30 tablet 6  . torsemide (DEMADEX) 10 MG tablet Take 1 tablet (10 mg total) by mouth daily. (Patient taking differently: Take 20 mg by mouth daily. ) 30 tablet 0  . vitamin B-12 (CYANOCOBALAMIN) 1000 MCG tablet Take 1,000 mcg by mouth daily.     No facility-administered medications prior to visit.     PAST MEDICAL  HISTORY: Past Medical History:  Diagnosis Date  . Acid reflux   . Anxiety   . Arthritis   . Cataract    surgery,bilateral  . Constipation   . Depression   . Diabetes mellitus without complication ([place])   . Dyspnea on exertion    a. 07/2015 Echo: EF 55-60%, no rwma, mild MR, mod dil LA/RA/RV, mod TR, triv PR, PASP 44mmHg.  . Family history of adverse reaction to anesthesia    mother always had n/v  . Hx: UTI (urinary tract infection)   . Hypercholesterolemia   . Hypertensive heart disease   . Insomnia   . Multifactorial gait disorder   . Non-obstructive CAD    a. 07/2015 Cath: LM 30-40d, LAD min irregs, LCX min irregs,  RCA 30p, RA 17, RV 33/7, PA 42/19, PCWP 17.  . Osteoarthritis    a. 06/2015 s/p R total hip arthroplasty.  [name] PAF (paroxysmal atrial fibrillation) ([place])    a. Dx 07/2015, CHA2DS2VASc = 6-->Eliquis.  . Premature atrial contractions   . Pulmonary hypertension ([place])    a. 07/2015 Echo: PASP 64mmHg.  [name] Pyloric antral stenosis    in childhood,infancy  . Sleep apnea with use of continuous positive airway pressure (CPAP) 10/19/2012   a. compliant w/ CPAP.    PAST SURGICAL HISTORY: Past Surgical History:  Procedure Laterality Date  . ABDOMINAL HYSTERECTOMY  1970  . ABDOMINAL SURGERY     as a 31 day old baby  . CARDIAC CATHETERIZATION N/A 07/14/2015   Procedure: Right/Left Heart Cath and Coronary Angiography;  Surgeon: Sanda Klein, MD;  Location: [place] CV LAB;  Service: Cardiovascular;  Laterality: N/A;  . CARDIOVERSION N/A 11/04/2015   Procedure: CARDIOVERSION;  Surgeon: Lelon Perla, MD;  Location: [facility] ENDOSCOPY;  Service: Cardiovascular;  Laterality: N/A;  . CARDIOVERSION N/A 02/25/2016   Procedure: CARDIOVERSION;  Surgeon: Pixie Casino, MD;  Location: [facility] ENDOSCOPY;  Service: Cardiovascular;  Laterality: N/A;  . COLONOSCOPY    . EYE SURGERY Bilateral    cataract surgery with lens implant  . INNER EAR SURGERY Left    x 3  . LAPAROSCOPIC SALPINGO  OOPHERECTOMY    . NM MYOVIEW LTD  11/30/2007   normal stress nuclear study/EF-83%  . TONSILLECTOMY    . TOTAL HIP ARTHROPLASTY Right 06/26/2015   Procedure: RIGHT TOTAL HIP ARTHROPLASTY ANTERIOR APPROACH;  Surgeon: Leandrew Koyanagi, MD;  Location: [place];  Service: Orthopedics;  Laterality: Right;  . TRANSTHORACIC ECHOCARDIOGRAM  07/23/2009   mild concentric LVH/mild mitral insufficiency/ moderate tricuspid innsufficiency/mild pulmonary HTN/EF- 55-60%  . VAGINAL DELIVERY      FAMILY HISTORY: Family History  Problem Relation Age of Onset  . Heart attack Father   . Heart failure Mother   . Cancer Mother        colon,kidney  . Diabetes Sister   . Hypertension Sister   . Heart failure Sister     SOCIAL HISTORY: Social History   Social History  . Marital status: Married    Spouse name: N/A  . Number of children: 1  . Years of education: 62   Occupational History  . Retired    Social History Main Topics  . Smoking status: Never Smoker  . Smokeless tobacco: Never Used  . Alcohol use No     Comment: rare  . Drug use: No  . Sexual activity: Not on file   Other Topics Concern  . Not on file   Social History Narrative   Patient lives alone.    Patient is widowed.    Patient retired.    Patient some college.    Patient has one child.    Caffeine 1-2 cups daily avg.      PHYSICAL EXAM  Vitals:   07/21/16 1239  BP: (!) 142/63  Pulse: (!) 57  Weight: 170 lb 12.8 oz (77.5 kg)  Height: 5' (1.524 m)   Body mass index is 33.36 kg/m.  Generalized: Well developed, in no acute distress   Neurological examination  Mentation: Alert oriented to time, place, history taking. Follows all commands speech and language fluent Cranial nerve II-XII: Pupils were equal round reactive to light. Extraocular movements were full, visual field were full on confrontational test. Facial sensation and strength were normal. Uvula  tongue midline. Head turning and shoulder shrug  were normal and  symmetric. Motor: The motor testing reveals 5 over 5 strength of all 4 extremities. Good symmetric motor tone is noted throughout.  Sensory: Sensory testing is intact to soft touch on all 4 extremities. No evidence of extinction is noted.  Coordination: Cerebellar testing reveals good finger-nose-finger and heel-to-shin bilaterally.  Gait and station: Gait is normal.  Reflexes: Deep tendon reflexes are symmetric and normal bilaterally.   DIAGNOSTIC DATA (LABS, IMAGING, TESTING) - I reviewed patient records, labs, notes, testing and imaging myself where available.  Lab Results  Component Value Date   WBC 5.3 03/11/2016   HGB 13.2 03/11/2016   HCT 40.2 03/11/2016   MCV 95.7 03/11/2016   PLT 224 03/11/2016      Component Value Date/Time   NA 140 06/07/2016 1033   NA 139 07/02/2015   K 3.8 06/07/2016 1033   CL 104 06/07/2016 1033   CO2 28 06/07/2016 1033   GLUCOSE 180 (H) 06/07/2016 1033   BUN 18 06/07/2016 1033   BUN 15 07/02/2015   CREATININE 1.03 (H) 06/07/2016 1033   CREATININE 0.82 02/19/2016 1217   CALCIUM 9.4 06/07/2016 1033   PROT 6.7 03/11/2016 1535   ALBUMIN 3.7 03/11/2016 1535   AST 36 03/11/2016 1535   ALT 49 03/11/2016 1535   ALKPHOS 100 03/11/2016 1535   BILITOT 0.8 03/11/2016 1535   GFRNONAA 49 (L) 06/07/2016 1033   GFRAA 57 (L) 06/07/2016 1033    Lab Results  Component Value Date   TSH 3.780 03/17/2016      ASSESSMENT AND PLAN 83 y.o. year old female  has a past medical history of Acid reflux; Anxiety; Arthritis; Cataract; Constipation; Depression; Diabetes mellitus without complication ([place]); Dyspnea on exertion; Family history of adverse reaction to anesthesia; UTI (urinary tract infection); Hypercholesterolemia; Hypertensive heart disease; Insomnia; Multifactorial gait disorder; Non-obstructive CAD; Osteoarthritis; PAF (paroxysmal atrial fibrillation) ([place]); Premature atrial contractions; Pulmonary hypertension ([place]); Pyloric antral stenosis; and Sleep  apnea with use of continuous positive airway pressure (CPAP) (10/19/2012). here with :  1. Obstructive sleep apnea on CPAP  Overall the patient's download shows excellent compliance and good treatment of her apnea. She is encouraged to continue using CPAP nightly and for greater than 4 hours each night. Patient is advised that if her symptoms worsen or she develops new symptoms she should let us know. She will follow-up in one year or sooner if needed.  I spent 15 minutes with the patient 50% of this time was spent reviewing the CPAP download with the patient.    Ward Givens, MSN, NP-C 07/21/2016, 12:09 PM [facility] [address], [place], [place] [address] [phone]

## 2016-08-01 DIAGNOSIS — I5043 Acute on chronic combined systolic (congestive) and diastolic (congestive) heart failure: Secondary | ICD-10-CM | POA: Diagnosis not present

## 2016-08-01 DIAGNOSIS — R0902 Hypoxemia: Secondary | ICD-10-CM | POA: Diagnosis not present

## 2016-08-01 DIAGNOSIS — G4733 Obstructive sleep apnea (adult) (pediatric): Secondary | ICD-10-CM | POA: Diagnosis not present

## 2016-08-10 DIAGNOSIS — R69 Illness, unspecified: Secondary | ICD-10-CM | POA: Diagnosis not present

## 2016-08-18 DIAGNOSIS — H353211 Exudative age-related macular degeneration, right eye, with active choroidal neovascularization: Secondary | ICD-10-CM | POA: Diagnosis not present

## 2016-08-18 DIAGNOSIS — E119 Type 2 diabetes mellitus without complications: Secondary | ICD-10-CM | POA: Diagnosis not present

## 2016-08-18 DIAGNOSIS — H04123 Dry eye syndrome of bilateral lacrimal glands: Secondary | ICD-10-CM | POA: Diagnosis not present

## 2016-08-18 DIAGNOSIS — H52203 Unspecified astigmatism, bilateral: Secondary | ICD-10-CM | POA: Diagnosis not present

## 2016-08-19 DIAGNOSIS — H43813 Vitreous degeneration, bilateral: Secondary | ICD-10-CM | POA: Diagnosis not present

## 2016-08-19 DIAGNOSIS — H353131 Nonexudative age-related macular degeneration, bilateral, early dry stage: Secondary | ICD-10-CM | POA: Diagnosis not present

## 2016-08-19 DIAGNOSIS — E119 Type 2 diabetes mellitus without complications: Secondary | ICD-10-CM | POA: Diagnosis not present

## 2016-08-25 ENCOUNTER — Emergency Department
Admission: EM | Admit: 2016-08-25 | Discharge: 2016-08-25 | Disposition: A | Discharge status: Home / Self Care | Payer: Medicare HMO | Source: Home / Self Care | Attending: Family Medicine | Admitting: Family Medicine

## 2016-08-25 DIAGNOSIS — T148XXA Other injury of unspecified body region, initial encounter: Secondary | ICD-10-CM

## 2016-08-25 NOTE — ED Provider Notes (Signed)
CSN: [id number]     Arrival date & time 08/25/16  1057 History   First MD Initiated Contact with Patient 08/25/16 1131     Chief Complaint  Patient presents with  . Vaginal Bleeding    perinieum   (Consider location/radiation/quality/duration/timing/severity/associated sxs/prior Treatment) HPI Melinda [name] is a 83 y.o. female presenting to UC with c/o bleeding from groin area that she noticed after getting out of the shower this morning.  Pt notes she does not feel pain but noticed blood running down her legs and is unable to see where the bleeding is coming from.  She is on Eliquis.  She is also on Lasix, which makes her very itchy so she does tend to scratch herself all over.  She thinks she accidentally cut herself from scratching.    Past Medical History:  Diagnosis Date  . A-fib ([place])   . Acid reflux   . Anxiety   . Arthritis   . Cataract    surgery,bilateral  . Constipation   . Depression   . Diabetes mellitus without complication ([place])   . Dyspnea on exertion    a. 07/2015 Echo: EF 55-60%, no rwma, mild MR, mod dil LA/RA/RV, mod TR, triv PR, PASP 12mmHg.  . Falls   . Family history of adverse reaction to anesthesia    mother always had n/v  . Hx: UTI (urinary tract infection)   . Hypercholesterolemia   . Hypertensive heart disease   . Insomnia   . Multifactorial gait disorder   . Non-obstructive CAD    a. 07/2015 Cath: LM 30-40d, LAD min irregs, LCX min irregs, RCA 30p, RA 17, RV 33/7, PA 42/19, PCWP 17.  . Osteoarthritis    a. 06/2015 s/p R total hip arthroplasty.  [name] PAF (paroxysmal atrial fibrillation) ([place])    a. Dx 07/2015, CHA2DS2VASc = 6-->Eliquis.  . Premature atrial contractions   . Pulmonary hypertension ([place])    a. 07/2015 Echo: PASP 69mmHg.  [name] Pyloric antral stenosis    in childhood,infancy  . Sleep apnea with use of continuous positive airway pressure (CPAP) 10/19/2012   a. compliant w/ CPAP.   Past Surgical History:  Procedure Laterality Date  .  ABDOMINAL HYSTERECTOMY  1970  . ABDOMINAL SURGERY     as a 49 day old baby  . CARDIAC CATHETERIZATION N/A 07/14/2015   Procedure: Right/Left Heart Cath and Coronary Angiography;  Surgeon: Sanda Klein, MD;  Location: [place] CV LAB;  Service: Cardiovascular;  Laterality: N/A;  . CARDIOVERSION N/A 11/04/2015   Procedure: CARDIOVERSION;  Surgeon: Lelon Perla, MD;  Location: [facility] ENDOSCOPY;  Service: Cardiovascular;  Laterality: N/A;  . CARDIOVERSION N/A 02/25/2016   Procedure: CARDIOVERSION;  Surgeon: Pixie Casino, MD;  Location: [facility] ENDOSCOPY;  Service: Cardiovascular;  Laterality: N/A;  . COLONOSCOPY    . EYE SURGERY Bilateral    cataract surgery with lens implant  . INNER EAR SURGERY Left    x 3  . LAPAROSCOPIC SALPINGO OOPHERECTOMY    . NM MYOVIEW LTD  11/30/2007   normal stress nuclear study/EF-83%  . TONSILLECTOMY    . TOTAL HIP ARTHROPLASTY Right 06/26/2015   Procedure: RIGHT TOTAL HIP ARTHROPLASTY ANTERIOR APPROACH;  Surgeon: Leandrew Koyanagi, MD;  Location: [place];  Service: Orthopedics;  Laterality: Right;  . TRANSTHORACIC ECHOCARDIOGRAM  07/23/2009   mild concentric LVH/mild mitral insufficiency/ moderate tricuspid innsufficiency/mild pulmonary HTN/EF- 55-60%  . VAGINAL DELIVERY     Family History  Problem Relation Age of Onset  .  Heart attack Father   . Heart failure Mother   . Cancer Mother        colon,kidney  . Diabetes Sister   . Hypertension Sister   . Heart failure Sister    Social History  Substance Use Topics  . Smoking status: Never Smoker  . Smokeless tobacco: Never Used  . Alcohol use No     Comment: rare   OB History    No data available     Review of Systems  Skin: Positive for wound. Negative for color change.    Allergies  Lasix [furosemide]; Phenobarbital; and Amitiza [lubiprostone]  Home Medications   Prior to Admission medications   Medication Sig Start Date End Date Taking? Authorizing Provider  acetaminophen (TYLENOL) 650 MG CR  tablet Take 650-1,300 mg by mouth 2 (two) times daily as needed for pain (arthritis pain).     [provider]  amLODipine (NORVASC) 5 MG tablet Take 5 mg by mouth daily.    [provider]  apixaban (ELIQUIS) 5 MG TABS tablet Take 1 tablet (5 mg total) by mouth 2 (two) times daily. 07/15/15   Robbie Lis, MD  atorvastatin (LIPITOR) 40 MG tablet Take 40 mg by mouth daily.     [provider]  Calcium Carbonate-Vitamin D3 (CALCIUM 600+D3) 600-400 MG-UNIT TABS Take 1 tablet by mouth daily.    [provider]  cholecalciferol (VITAMIN D) 1000 UNITS tablet Take 1,000 Units by mouth daily.     [provider]  desipramine (NORPRAMIN) 25 MG tablet Take 25 mg by mouth daily.    [provider]  famotidine (PEPCID) 20 MG tablet Take 20 mg by mouth daily.    [provider]  glucosamine-chondroitin 500-400 MG tablet Take 1 tablet by mouth every other day.     [provider]  linaclotide (LINZESS) 145 MCG CAPS capsule Take 145 mcg by mouth daily before breakfast.     [provider]  LORazepam (ATIVAN) 0.5 MG tablet Take 1 mg by mouth at bedtime.  12/26/15   [provider]  metoprolol succinate (TOPROL-XL) 25 MG 24 hr tablet Take 0.5 tablets (12.5 mg total) by mouth every evening. 06/02/16   Baldwin Jamaica, PA-C  Multiple Vitamins-Minerals (OCUVITE PO) Take 1 tablet by mouth daily.    [provider]  Polyethyl Glycol-Propyl Glycol (SYSTANE) 0.4-0.3 % GEL ophthalmic gel Place 1 application into both eyes at bedtime as needed (dry eyes).    [provider]  polyethylene glycol (MIRALAX / GLYCOLAX) packet Take 17 g by mouth daily. Mix in 8 oz liquid and drink    [provider]  potassium chloride (K-DUR) 10 MEQ tablet Take 2 tablets (20 mEq total) by mouth daily. 06/07/16 09/05/16  Sherran Needs, NP  PRESCRIPTION MEDICATION Inhale into the lungs at bedtime. Uses CPAP with oxygen    [provider]  sotalol (BETAPACE) 160 MG tablet TAKE 1 TABLET BY MOUTH IN THE MORNING 06/24/16   Sherran Needs, NP  torsemide (DEMADEX) 10 MG tablet Take 1 tablet (10 mg total) by mouth daily. Patient taking differently: Take 20 mg by mouth daily.  03/03/16   Lavina Hamman, MD  vitamin B-12 (CYANOCOBALAMIN) 1000 MCG tablet Take 1,000 mcg by mouth daily.    [provider]   Meds Ordered and Administered this Visit  Medications - No data to display  BP 105/71 (BP Location: Left Arm)   Pulse (!) 103   Temp  97.6 F (36.4 C) (Oral)   Ht 5' (1.524 m)   Wt 170 lb (77.1 kg)   SpO2 93%   BMI 33.20 kg/m  No data found.   Physical Exam  Constitutional: She appears well-developed and well-nourished. No distress.  HENT:  Mouth/Throat: Oropharynx is clear and moist.  Cardiovascular: Normal rate.   Pulmonary/Chest: No respiratory distress.  Abdominal: Soft. She exhibits no distension. There is no tenderness.  Genitourinary:  Genitourinary Comments: Chaperoned exam. Superficial 16mm abrasion that is oozing red blood on Right labia. Bleeding controlled. Surrounding dried blood.  No vaginal or rectal bleeding.   Skin: She is not diaphoretic.  Nursing note and vitals reviewed.   Urgent Care Course     Procedures (including critical care time)  Labs Review Labs Reviewed - No data to display  Imaging Review No results found.   MDM   1. Superficial laceration of skin    Bleeding superficial abrasion. Wound is not deep.  Bleeding appears to have stopped.    Encouraged to avoid scratching area. If bleeding starts again, encouraged to apply firm pressure for 5-10 minutes. May try a cool compress to also help F/u with PCP as needed.    Noe Gens, PA-C 08/25/16 1250

## 2016-08-25 NOTE — ED Triage Notes (Signed)
Pt got out of the shower this morning, and had blood dripping down her legs.  She doesn't feel that it is vaginal bleeding, but thinks she may have scratched herself.

## 2016-08-25 NOTE — Discharge Instructions (Signed)
°  There is a very small superficial cut on the Right side, outside of your vagina.  Try to avoid scratching at this area.  If it starts to bleed again, you should apply direct pressure with guaze or a cloth for 5-10 minutes.  You may also apply a cool compress to help slow the bleeding.

## 2016-09-01 DIAGNOSIS — I5043 Acute on chronic combined systolic (congestive) and diastolic (congestive) heart failure: Secondary | ICD-10-CM | POA: Diagnosis not present

## 2016-09-01 DIAGNOSIS — R0902 Hypoxemia: Secondary | ICD-10-CM | POA: Diagnosis not present

## 2016-09-01 DIAGNOSIS — G4733 Obstructive sleep apnea (adult) (pediatric): Secondary | ICD-10-CM | POA: Diagnosis not present

## 2016-09-04 ENCOUNTER — Encounter (HOSPITAL_BASED_OUTPATIENT_CLINIC_OR_DEPARTMENT_OTHER): Payer: Self-pay | Admitting: *Deleted

## 2016-09-04 ENCOUNTER — Emergency Department (HOSPITAL_BASED_OUTPATIENT_CLINIC_OR_DEPARTMENT_OTHER): Payer: Medicare HMO

## 2016-09-04 ENCOUNTER — Emergency Department (HOSPITAL_BASED_OUTPATIENT_CLINIC_OR_DEPARTMENT_OTHER)
Admission: EM | Admit: 2016-09-04 | Discharge: 2016-09-04 | Disposition: A | Discharge status: Home / Self Care | Payer: Medicare HMO | Attending: Emergency Medicine | Admitting: Emergency Medicine

## 2016-09-04 DIAGNOSIS — M6281 Muscle weakness (generalized): Secondary | ICD-10-CM | POA: Diagnosis present

## 2016-09-04 DIAGNOSIS — I119 Hypertensive heart disease without heart failure: Secondary | ICD-10-CM | POA: Insufficient documentation

## 2016-09-04 DIAGNOSIS — Z7901 Long term (current) use of anticoagulants: Secondary | ICD-10-CM | POA: Diagnosis not present

## 2016-09-04 DIAGNOSIS — Z96641 Presence of right artificial hip joint: Secondary | ICD-10-CM | POA: Insufficient documentation

## 2016-09-04 DIAGNOSIS — I2510 Atherosclerotic heart disease of native coronary artery without angina pectoris: Secondary | ICD-10-CM | POA: Insufficient documentation

## 2016-09-04 DIAGNOSIS — E119 Type 2 diabetes mellitus without complications: Secondary | ICD-10-CM | POA: Insufficient documentation

## 2016-09-04 DIAGNOSIS — Z79899 Other long term (current) drug therapy: Secondary | ICD-10-CM | POA: Insufficient documentation

## 2016-09-04 DIAGNOSIS — I951 Orthostatic hypotension: Secondary | ICD-10-CM

## 2016-09-04 DIAGNOSIS — N390 Urinary tract infection, site not specified: Secondary | ICD-10-CM | POA: Diagnosis not present

## 2016-09-04 DIAGNOSIS — R000 Tachycardia, unspecified: Secondary | ICD-10-CM | POA: Diagnosis not present

## 2016-09-04 LAB — URINALYSIS, ROUTINE W REFLEX MICROSCOPIC
Bilirubin Urine: NEGATIVE
Glucose, UA: NEGATIVE mg/dL
Hgb urine dipstick: NEGATIVE
KETONES UR: NEGATIVE mg/dL
NITRITE: NEGATIVE
Protein, ur: 30 mg/dL — AB
Specific Gravity, Urine: 1.012 (ref 1.005–1.030)
pH: 6 (ref 5.0–8.0)

## 2016-09-04 LAB — CBC WITH DIFFERENTIAL/PLATELET
BASOS ABS: 0 10*3/uL (ref 0.0–0.1)
BASOS PCT: 0 %
Eosinophils Absolute: 0.2 10*3/uL (ref 0.0–0.7)
Eosinophils Relative: 3 %
HEMATOCRIT: 42.3 % (ref 36.0–46.0)
Hemoglobin: 14.7 g/dL (ref 12.0–15.0)
LYMPHS PCT: 22 %
Lymphs Abs: 1.2 10*3/uL (ref 0.7–4.0)
MCH: 32.3 pg (ref 26.0–34.0)
MCHC: 34.8 g/dL (ref 30.0–36.0)
MCV: 93 fL (ref 78.0–100.0)
MONO ABS: 0.7 10*3/uL (ref 0.1–1.0)
Monocytes Relative: 14 %
NEUTROS ABS: 3.3 10*3/uL (ref 1.7–7.7)
NEUTROS PCT: 61 %
Platelets: 178 10*3/uL (ref 150–400)
RBC: 4.55 MIL/uL (ref 3.87–5.11)
RDW: 12.7 % (ref 11.5–15.5)
WBC: 5.4 10*3/uL (ref 4.0–10.5)

## 2016-09-04 LAB — BASIC METABOLIC PANEL
ANION GAP: 10 (ref 5–15)
BUN: 27 mg/dL — AB (ref 6–20)
CHLORIDE: 101 mmol/L (ref 101–111)
CO2: 26 mmol/L (ref 22–32)
Calcium: 10.4 mg/dL — ABNORMAL HIGH (ref 8.9–10.3)
Creatinine, Ser: 1.33 mg/dL — ABNORMAL HIGH (ref 0.44–1.00)
GFR calc Af Amer: 42 mL/min — ABNORMAL LOW (ref >60)
GFR, EST NON AFRICAN AMERICAN: 36 mL/min — AB (ref >60)
Glucose, Bld: 138 mg/dL — ABNORMAL HIGH (ref 65–99)
Potassium: 3.9 mmol/L (ref 3.5–5.1)
SODIUM: 137 mmol/L (ref 135–145)

## 2016-09-04 LAB — URINALYSIS, MICROSCOPIC (REFLEX)

## 2016-09-04 LAB — TROPONIN I

## 2016-09-04 LAB — D-DIMER, QUANTITATIVE (NOT AT ARMC): D DIMER QUANT: 0.37 ug{FEU}/mL (ref 0.00–0.50)

## 2016-09-04 MED ORDER — CEPHALEXIN 250 MG PO CAPS
250.0000 mg | ORAL_CAPSULE | Freq: Once | ORAL | Status: AC
Start: 2016-09-04 — End: 2016-09-04
  Administered 2016-09-04: 250 mg via ORAL
  Filled 2016-09-04: qty 1

## 2016-09-04 MED ORDER — SODIUM CHLORIDE 0.9 % IV BOLUS (SEPSIS)
500.0000 mL | Freq: Once | INTRAVENOUS | Status: AC
  Administered 2016-09-04: 500 mL via INTRAVENOUS

## 2016-09-04 MED ORDER — CEPHALEXIN 250 MG PO CAPS: 250.0000 mg | ORAL_CAPSULE | Freq: Four times a day (QID) | ORAL | 0 refills | Status: DC

## 2016-09-04 NOTE — ED Provider Notes (Signed)
[facility] DEPT [facility] Provider Note   CSN: [id number] Arrival date & time: 09/04/16  1146     History   Chief Complaint Chief Complaint  Patient presents with  . Weakness    HPI Melinda [name] is a 83 y.o. female.  HPI  83 year old female presents with acute, transient weakness. She has a history of paroxysmal A. fib, nonobstructive CAD, diabetes, hypertension and states that she frequently feels similar to the way she did this morning except in different situations. Typically if she stands up too quick or when she stands up she will feel transiently weak just like this morning. She has been told this is due to her medicines. However today she was in the shower and then while she was getting out to dry off she all of a sudden felt weak. She felt like she had to sit down but does not feel like she was dizzy or lightheaded. Felt like her legs were going to give out. She denies any recent or current headache, nausea, vomiting, blurry vision, chest pain, palpitations. During the episode she felt a little short of breath. When she said that she checked her blood pressure and the systolic blood pressure was 50. When her family came over they checked it and was in the 24s and her heart rate was in the low 100s. She denies feeling palpitations. She did not pass out. She denies any urinary symptoms.  She tells me that yesterday when she went to go take her nightly medicines she noticed that she had missed her morning medicines. Thus she took all of her morning medicines and nightly medicines last night and then took her morning medicines again this morning.  Past Medical History:  Diagnosis Date  . A-fib ([place])   . Acid reflux   . Anxiety   . Arthritis   . Cataract    surgery,bilateral  . Constipation   . Depression   . Diabetes mellitus without complication (HCC)    Pre diabetes  . Dyspnea on exertion    a. 07/2015 Echo: EF 55-60%, no rwma, mild MR, mod dil LA/RA/RV, mod TR, triv  PR, PASP 58mmHg.  . Falls   . Family history of adverse reaction to anesthesia    mother always had n/v  . Hx: UTI (urinary tract infection)   . Hypercholesterolemia   . Hypertensive heart disease   . Insomnia   . Multifactorial gait disorder   . Non-obstructive CAD    a. 07/2015 Cath: LM 30-40d, LAD min irregs, LCX min irregs, RCA 30p, RA 17, RV 33/7, PA 42/19, PCWP 17.  . Osteoarthritis    a. 06/2015 s/p R total hip arthroplasty.  [name] PAF (paroxysmal atrial fibrillation) ([place])    a. Dx 07/2015, CHA2DS2VASc = 6-->Eliquis.  . Premature atrial contractions   . Pulmonary hypertension ([place])    a. 07/2015 Echo: PASP 76mmHg.  [name] Pyloric antral stenosis    in childhood,infancy  . Sleep apnea with use of continuous positive airway pressure (CPAP) 10/19/2012   a. compliant w/ CPAP.    Patient Active Problem List   Diagnosis Date Noted  . Pain in right hip 06/22/2016  . Encounter for monitoring sotalol therapy 05/31/2016  . Chest pain   . CHF exacerbation ([place]) 02/29/2016  . Acute respiratory failure with hypoxia ([place]) 02/29/2016  . Persistent atrial fibrillation ([place])   . Hypertensive heart disease   . Hypercholesterolemia   . Pulmonary hypertension ([place])   . Dyspnea on exertion   .  Non-obstructive CAD   . Chest pain, moderate coronary artery risk 07/14/2015  . Pain in the chest   . PAF (paroxysmal atrial fibrillation) (HCC)   . Pulmonary arterial hypertension ([place])   . Chest pain with moderate risk for cardiac etiology 07/08/2015  . Dyspnea 07/08/2015  . Essential hypertension 07/08/2015  . Hyperlipidemia 07/08/2015  . Osteoarthritis of right hip 06/26/2015  . Hip joint replacement status 06/26/2015  . OSA on CPAP 03/27/2015  . Ataxia 03/27/2015  . Falls 03/27/2015  . Insomnia, controlled 03/27/2015  . Insomnia with sleep apnea 03/22/2014  . Adjustment disorder with mixed anxiety and depressed mood 03/22/2014  . Sleep apnea with use of continuous positive airway pressure (CPAP)  10/19/2012  . Multifactorial gait disorder     Past Surgical History:  Procedure Laterality Date  . ABDOMINAL HYSTERECTOMY  1970  . ABDOMINAL SURGERY     as a 65 day old baby  . CARDIAC CATHETERIZATION N/A 07/14/2015   Procedure: Right/Left Heart Cath and Coronary Angiography;  Surgeon: Sanda Klein, MD;  Location: [facility];  Service: Cardiovascular;  Laterality: N/A;  . CARDIOVERSION N/A 11/04/2015   Procedure: CARDIOVERSION;  Surgeon: Lelon Perla, MD;  Location: [facility] ENDOSCOPY;  Service: Cardiovascular;  Laterality: N/A;  . CARDIOVERSION N/A 02/25/2016   Procedure: CARDIOVERSION;  Surgeon: Pixie Casino, MD;  Location: [facility] ENDOSCOPY;  Service: Cardiovascular;  Laterality: N/A;  . COLONOSCOPY    . EYE SURGERY Bilateral    cataract surgery with lens implant  . INNER EAR SURGERY Left    x 3  . JOINT REPLACEMENT Right   . LAPAROSCOPIC SALPINGO OOPHERECTOMY    . NM MYOVIEW LTD  11/30/2007   normal stress nuclear study/EF-83%  . TONSILLECTOMY    . TOTAL HIP ARTHROPLASTY Right 06/26/2015   Procedure: RIGHT TOTAL HIP ARTHROPLASTY ANTERIOR APPROACH;  Surgeon: Leandrew Koyanagi, MD;  Location: [place] [facility];  Service: Orthopedics;  Laterality: Right;  . TRANSTHORACIC ECHOCARDIOGRAM  07/23/2009   mild concentric LVH/mild mitral insufficiency/ moderate tricuspid innsufficiency/mild pulmonary HTN/EF- 55-60%  . VAGINAL DELIVERY      OB History    No data available       Home Medications    Prior to Admission medications   Medication Sig Start Date End Date Taking? Authorizing Provider  acetaminophen (TYLENOL) 650 MG CR tablet Take 650-1,300 mg by mouth 2 (two) times daily as needed for pain (arthritis pain).    Yes [provider]  alum & mag hydroxide-simeth (MAALOX/MYLANTA) 200-200-20 MG/5ML suspension Take 15 mLs by mouth every 6 (six) hours as needed for indigestion or heartburn.   Yes [provider]  amLODipine (NORVASC) 5 MG tablet Take 5 mg by mouth daily.   Yes  [provider]  apixaban (ELIQUIS) 5 MG TABS tablet Take 1 tablet (5 mg total) by mouth 2 (two) times daily. 07/15/15  Yes Robbie Lis, MD  atorvastatin (LIPITOR) 40 MG tablet Take 40 mg by mouth daily.    Yes [provider]  benzonatate (TESSALON) 100 MG capsule Take by mouth 3 (three) times daily as needed for cough.   Yes [provider]  Calcium Carbonate-Vitamin D3 (CALCIUM 600+D3) 600-400 MG-UNIT TABS Take 1 tablet by mouth daily.   Yes [provider]  desipramine (NORPRAMIN) 25 MG tablet Take 25 mg by mouth daily.   Yes [provider]  diphenhydrAMINE (BENADRYL) 25 mg capsule Take 25 mg by mouth every 6 (six) hours as needed.   Yes  [provider]  famotidine (PEPCID) 20 MG tablet Take 20 mg by mouth daily.   Yes [provider]  GLUCOSAMINE-CHONDROITIN DS PO Take by mouth.   Yes [provider]  guaiFENesin (MUCINEX) 600 MG 12 hr tablet Take by mouth 2 (two) times daily.   Yes [provider]  linaclotide (LINZESS) 145 MCG CAPS capsule Take 145 mcg by mouth daily before breakfast.    Yes [provider]  lisinopril (PRINIVIL,ZESTRIL) 20 MG tablet Take 20 mg by mouth daily.   Yes [provider]  LORazepam (ATIVAN) 0.5 MG tablet Take 1 mg by mouth at bedtime.  12/26/15  Yes [provider]  metoprolol succinate (TOPROL-XL) 25 MG 24 hr tablet Take 0.5 tablets (12.5 mg total) by mouth every evening. 06/02/16  Yes Baldwin Jamaica, PA-C  omeprazole (PRILOSEC) 20 MG capsule Take 20 mg by mouth daily.   Yes [provider]  Polyethyl Glycol-Propyl Glycol (SYSTANE) 0.4-0.3 % GEL ophthalmic gel Place 1 application into both eyes at bedtime as needed (dry eyes).   Yes [provider]  potassium chloride (K-DUR) 10 MEQ tablet Take 2 tablets (20 mEq total) by mouth daily. 06/07/16 09/05/16 Yes Sherran Needs, NP  sotalol (BETAPACE) 160 MG tablet TAKE 1 TABLET BY MOUTH IN THE  MORNING 06/24/16  Yes Sherran Needs, NP  torsemide (DEMADEX) 10 MG tablet Take 1 tablet (10 mg total) by mouth daily. Patient taking differently: Take 20 mg by mouth daily.  03/03/16  Yes Lavina Hamman, MD  vitamin B-12 (CYANOCOBALAMIN) 1000 MCG tablet Take 1,000 mcg by mouth daily.   Yes [provider]  cephALEXin (KEFLEX) 250 MG capsule Take 1 capsule (250 mg total) by mouth 4 (four) times daily. 09/04/16   Sherwood Gambler, MD  cholecalciferol (VITAMIN D) 1000 UNITS tablet Take 1,000 Units by mouth daily.     [provider]  glucosamine-chondroitin 500-400 MG tablet Take 1 tablet by mouth every other day.     [provider]  Multiple Vitamins-Minerals (OCUVITE PO) Take 1 tablet by mouth daily.    [provider]  polyethylene glycol (MIRALAX / GLYCOLAX) packet Take 17 g by mouth daily. Mix in 8 oz liquid and drink    [provider]  PRESCRIPTION MEDICATION Inhale into the lungs at bedtime. Uses CPAP with oxygen    [provider]    Family History Family History  Problem Relation Age of Onset  . Heart attack Father   . Heart failure Mother   . Cancer Mother        colon,kidney  . Diabetes Sister   . Hypertension Sister   . Heart failure Sister     Social History Social History  Substance Use Topics  . Smoking status: Never Smoker  . Smokeless tobacco: Never Used  . Alcohol use No     Comment: rare     Allergies   Lasix [furosemide]; Phenobarbital; and Amitiza [lubiprostone]   Review of Systems Review of Systems  Constitutional: Negative for fever.  Respiratory: Positive for shortness of breath.   Cardiovascular: Negative for chest pain and leg swelling.  Gastrointestinal: Negative for abdominal pain and vomiting.  Genitourinary: Negative for dysuria.  Neurological: Positive for weakness. Negative for dizziness, syncope, light-headedness and numbness.  All other systems reviewed and are  negative.    Physical Exam Updated Vital Signs BP 109/82 (BP Location: Left Arm)   Pulse 90   Temp 97.6 F (36.4 C) (Oral)  Resp 20   Ht 5' (1.524 m)   Wt 77.1 kg (170 lb)   SpO2 96%   BMI 33.20 kg/m   Physical Exam  Constitutional: She is oriented to person, place, and time. She appears well-developed and well-nourished. No distress.  HENT:  Head: Normocephalic and atraumatic.  Right Ear: External ear normal.  Left Ear: External ear normal.  Nose: Nose normal.  Eyes: EOM are normal. Pupils are equal, round, and reactive to light. Right eye exhibits no discharge. Left eye exhibits no discharge.  Cardiovascular: Normal rate, regular rhythm and normal heart sounds.   No murmur heard. Pulmonary/Chest: Effort normal and breath sounds normal.  Abdominal: Soft. There is no tenderness.  Neurological: She is alert and oriented to person, place, and time.  CN 3-12 grossly intact. 5/5 strength in all 4 extremities. Grossly normal sensation. Normal finger to nose.   Skin: Skin is warm and dry. She is not diaphoretic.  Nursing note and vitals reviewed.    ED Treatments / Results  Labs (all labs ordered are listed, but only abnormal results are displayed) Labs Reviewed  BASIC METABOLIC PANEL - Abnormal; Notable for the following:       Result Value   Glucose, Bld 138 (*)    BUN 27 (*)    Creatinine, Ser 1.33 (*)    Calcium 10.4 (*)    GFR calc non Af Amer 36 (*)    GFR calc Af Amer 42 (*)    All other components within normal limits  URINALYSIS, ROUTINE W REFLEX MICROSCOPIC - Abnormal; Notable for the following:    APPearance CLOUDY (*)    Protein, ur 30 (*)    Leukocytes, UA LARGE (*)    All other components within normal limits  URINALYSIS, MICROSCOPIC (REFLEX) - Abnormal; Notable for the following:    Bacteria, UA MANY (*)    Squamous Epithelial / LPF 0-5 (*)    All other components within normal limits  URINE CULTURE  TROPONIN I  CBC WITH DIFFERENTIAL/PLATELET   D-DIMER, QUANTITATIVE (NOT AT [facility])    EKG  EKG Interpretation  Date/Time:  Saturday September 04 2016 12:06:00 EDT Ventricular Rate:  104 PR Interval:  140 QRS Duration: 94 QT Interval:  310 QTC Calculation: 407 R Axis:   -41 Text Interpretation:  Sinus tachycardia Left axis deviation Low voltage QRS Cannot rule out Anterior infarct , age undetermined ST & T wave abnormality, consider lateral ischemia Abnormal ECG changes noted compared to June 07 2016 Confirmed by Sherwood Gambler [phone]) on 09/04/2016 12:31:18 PM       Radiology Dg Chest 2 View  Result Date: 09/04/2016 CLINICAL DATA:  83 year old female with sudden onset weakness at 1030 hours today. Tachycardia and hypotension. EXAM: CHEST  2 VIEW COMPARISON:  03/11/2016 and earlier. FINDINGS: Semi upright AP and lateral views of the chest. Chronic elevation of the right hemidiaphragm with mildly lower lung volumes. Mediastinal contours are stable, cardiac size at the upper limits of normal. No pneumothorax, pulmonary edema, pleural effusion or confluent pulmonary opacity. Calcified aortic atherosclerosis. No acute osseous abnormality identified. Negative visible bowel gas pattern. IMPRESSION: 1.  No acute cardiopulmonary abnormality. 2. Chronic elevation of the right hemidiaphragm suggesting right phrenic nerve palsy. 3.  Calcified aortic atherosclerosis. Electronically Signed   By: Genevie Ann M.D.   On: 09/04/2016 14:05    Procedures Procedures (including critical care time)  Medications Ordered in ED Medications  sodium chloride 0.9 % bolus 500 mL (0 mLs Intravenous Stopped  09/04/16 1404)  sodium chloride 0.9 % bolus 500 mL (0 mLs Intravenous Stopped 09/04/16 1549)  cephALEXin (KEFLEX) capsule 250 mg (250 mg Oral Given 09/04/16 1528)     Initial Impression / Assessment and Plan / ED Course  I have reviewed the triage vital signs and the nursing notes.  Pertinent labs & imaging results that were available during my care of the  patient were reviewed by me and considered in my medical decision making (see chart for details).     Based on patient report, she appears to have chronic orthostatic hypotension. Symptoms today were similar to her orthostatic hypotension except that she had not adjusted up. I think this is likely from taking and I should dose of medicine last night and this morning. She has had poor fluid intake this morning as well. She feels better with IV fluids. I doubt CVA (normal neuro exam) and doubt arrhythmia as the cause of symptoms today. Renal disease is near baseline today. She was orthostatic prior to fluids, now feels better and ambulated throughout the department without difficulty. No urinary symptoms but urine obtained in triage is concerning for UTI. Thus will cover for infection. Given that this is likely a medication side effect I think it's reasonable to go home and f/u closely with PCP. Discussed return precautions.  Final Clinical Impressions(s) / ED Diagnoses   Final diagnoses:  Orthostatic hypotension  Acute UTI    New Prescriptions New Prescriptions   CEPHALEXIN (KEFLEX) 250 MG CAPSULE    Take 1 capsule (250 mg total) by mouth 4 (four) times daily.     Sherwood Gambler, MD 09/04/16 [phone]

## 2016-09-04 NOTE — ED Notes (Addendum)
Patient independently walked to the restroom with no assistance. EMT walked behind the patient in case the patients gait got unsteady. Steady gait to and from the bathroom. EMT asked if patient felt dizzy at all. Patient stated" I feel so much better now"

## 2016-09-04 NOTE — ED Triage Notes (Addendum)
Patient states she was in the shower today at 1030 and developed a sudden weakness and felt like she was going to fall.  Safely got out of the shower and took her blood pressure which was 80/55 with a HR of 105.  History of Afib, which is controlled.  States yesterday she was outside a lot and did not take her medications on time or drink enough water.  States she does not have any pain, but feels weak when standing. States she also developed a mild headache afterwards which lasted approximately 30 minutes and was relived by drinking a whole glass of water.

## 2016-09-06 LAB — URINE CULTURE

## 2016-09-07 ENCOUNTER — Telehealth: Payer: Self-pay | Admitting: Emergency Medicine

## 2016-09-07 NOTE — Telephone Encounter (Signed)
Post ED Visit - Positive Culture Follow-up: Successful Patient Follow-Up  Culture assessed and recommendations reviewed by: []  Elenor Quinones, Pharm.D. []  Heide Guile, Pharm.D., BCPS AQ-ID []  Parks Neptune, Pharm.D., BCPS []  Alycia Rossetti, Pharm.D., BCPS []  Gregory, Pharm.D., BCPS, AAHIVP []  Legrand Como, Pharm.D., BCPS, AAHIVP []  Salome Arnt, PharmD, BCPS []  Dimitri Ped, PharmD, BCPS []  Vincenza Hews, PharmD, BCPS  Positive urine culture  []  Patient discharged without antimicrobial prescription and treatment is now indicated []  Organism is resistant to prescribed ED discharge antimicrobial []  Patient with positive blood cultures  Changes discussed with ED provider: Joline Maxcy PA New antibiotic prescription no signs of infection, D/C keflex  Attempting to contact patient   Hazle Nordmann 09/07/2016, 3:35 PM

## 2016-09-07 NOTE — Progress Notes (Signed)
ED Antimicrobial Stewardship Positive Culture Follow Up   Melinda [name] is an 83 y.o. female who presented to [facility] on 09/04/2016 with a chief complaint of  Chief Complaint  Patient presents with  . Weakness    Recent Results (from the past 720 hour(s))  Urine culture     Status: Abnormal   Collection Time: 09/04/16 12:22 PM  Result Value Ref Range Status   Specimen Description URINE, CLEAN CATCH  Final   Special Requests NONE  Final   Culture >=100,000 COLONIES/mL ESCHERICHIA COLI (A)  Final   Report Status 09/06/2016 FINAL  Final   Organism ID, Bacteria ESCHERICHIA COLI (A)  Final      Susceptibility   Escherichia coli - MIC*    AMPICILLIN >=32 RESISTANT Resistant     CEFAZOLIN >=64 RESISTANT Resistant     CEFTRIAXONE <=1 SENSITIVE Sensitive     CIPROFLOXACIN <=0.25 SENSITIVE Sensitive     GENTAMICIN <=1 SENSITIVE Sensitive     IMIPENEM <=0.25 SENSITIVE Sensitive     NITROFURANTOIN <=16 SENSITIVE Sensitive     TRIMETH/SULFA <=20 SENSITIVE Sensitive     AMPICILLIN/SULBACTAM >=32 RESISTANT Resistant     PIP/TAZO 8 SENSITIVE Sensitive     Extended ESBL NEGATIVE Sensitive     * >=100,000 COLONIES/mL ESCHERICHIA COLI    [x]  Treated with cephalexin, organism resistant to prescribed antimicrobial []  Patient discharged originally without antimicrobial agent and treatment is now indicated  New antibiotic prescription: dc cephalexin, no further abx indicated  ED Provider: Joline Maxcy, PA-C  Wynell Balloon 09/07/2016, 8:28 AM Infectious Diseases Pharmacist Phone# [phone]

## 2016-09-16 ENCOUNTER — Telehealth: Payer: Self-pay | Admitting: Emergency Medicine

## 2016-09-21 DIAGNOSIS — Z6832 Body mass index (BMI) 32.0-32.9, adult: Secondary | ICD-10-CM | POA: Diagnosis not present

## 2016-09-21 DIAGNOSIS — I10 Essential (primary) hypertension: Secondary | ICD-10-CM | POA: Diagnosis not present

## 2016-09-21 DIAGNOSIS — I2510 Atherosclerotic heart disease of native coronary artery without angina pectoris: Secondary | ICD-10-CM | POA: Diagnosis not present

## 2016-09-21 DIAGNOSIS — I480 Paroxysmal atrial fibrillation: Secondary | ICD-10-CM | POA: Diagnosis not present

## 2016-09-21 DIAGNOSIS — E119 Type 2 diabetes mellitus without complications: Secondary | ICD-10-CM | POA: Diagnosis not present

## 2016-10-01 DIAGNOSIS — I5043 Acute on chronic combined systolic (congestive) and diastolic (congestive) heart failure: Secondary | ICD-10-CM | POA: Diagnosis not present

## 2016-10-01 DIAGNOSIS — G4733 Obstructive sleep apnea (adult) (pediatric): Secondary | ICD-10-CM | POA: Diagnosis not present

## 2016-10-01 DIAGNOSIS — R0902 Hypoxemia: Secondary | ICD-10-CM | POA: Diagnosis not present

## 2016-10-04 DIAGNOSIS — G4733 Obstructive sleep apnea (adult) (pediatric): Secondary | ICD-10-CM | POA: Diagnosis not present

## 2016-10-11 DIAGNOSIS — R69 Illness, unspecified: Secondary | ICD-10-CM | POA: Diagnosis not present

## 2016-10-12 DIAGNOSIS — R69 Illness, unspecified: Secondary | ICD-10-CM | POA: Diagnosis not present

## 2016-11-01 DIAGNOSIS — R0902 Hypoxemia: Secondary | ICD-10-CM | POA: Diagnosis not present

## 2016-11-01 DIAGNOSIS — I5043 Acute on chronic combined systolic (congestive) and diastolic (congestive) heart failure: Secondary | ICD-10-CM | POA: Diagnosis not present

## 2016-11-01 DIAGNOSIS — G4733 Obstructive sleep apnea (adult) (pediatric): Secondary | ICD-10-CM | POA: Diagnosis not present

## 2016-11-02 DIAGNOSIS — R69 Illness, unspecified: Secondary | ICD-10-CM | POA: Diagnosis not present

## 2016-11-16 ENCOUNTER — Other Ambulatory Visit: Payer: Self-pay | Admitting: Nurse Practitioner

## 2016-11-21 ENCOUNTER — Telehealth: Payer: Self-pay | Admitting: Internal Medicine

## 2016-11-21 NOTE — Telephone Encounter (Signed)
Patient has a hx of paroxysmal afib and was recently placed on sotalol. She's been compliant with her therapy. However, she claims that today she's been extremely fatigued and notes that her HR's have been in the 110's. Usually they are in the 50-60's. She hasn't rechecked it but is worried that she may be back in afib. She denies any chest pain or dizziness. I requested she come in to have an EKG and be further evaluated, but she states that she would like to wait out the storm and come in the following day for evaluation. I discussed with her that if symptoms worsen she should come sooner than later. In addition to this I asked her to recheck her BP and HR and call us back if the HR's remain significant elevated. If BP is stable I asked her to take a tab of Toprol XL 25mg , once rather than 12.5mg , in addition to the sotalol, if HR's remain elevated.

## 2016-11-24 DIAGNOSIS — H353211 Exudative age-related macular degeneration, right eye, with active choroidal neovascularization: Secondary | ICD-10-CM | POA: Diagnosis not present

## 2016-11-24 DIAGNOSIS — H353121 Nonexudative age-related macular degeneration, left eye, early dry stage: Secondary | ICD-10-CM | POA: Diagnosis not present

## 2016-11-25 ENCOUNTER — Other Ambulatory Visit (HOSPITAL_COMMUNITY): Payer: Self-pay | Admitting: Nurse Practitioner

## 2016-11-29 ENCOUNTER — Telehealth: Payer: Self-pay | Admitting: Cardiology

## 2016-11-29 MED ORDER — METOPROLOL SUCCINATE ER 25 MG PO TB24: 25.0000 mg | ORAL_TABLET | Freq: Every evening | ORAL | 3 refills | Status: DC

## 2016-11-29 NOTE — Telephone Encounter (Signed)
Pt of Dr. Martinique  Hx PAF  Pt called in to report HR increase for last 1.5-2 weeks. She is reporting rates in 110s, along with increased fatigue for duration.  Reports stable BPs.  She denies shortness of breath/dyspnea.  She states her O2 sats have been running in the low to mid 90s.  Pt called about a week ago on Saturday, spoke w on-call physician who recommended she increase her nightly metoprolol dose from 12.5mg  to 25mg  if BPs were stable. She was out of this medication and so I've sent Rx to her local pharmacy at patient's request.  Pt takes sotalol 160mg  in AM. Aware not to adjust dosage of this medication.   Discussed options. She's been eval'd by Butch Penny in [facility] clinic before, and was agreeable to come in for re-evaluation. I called Rushie Goltz RN who helped arrange appt. Pt will continue meds as she is currently taking and follow up in A Fib clinic tomorrow @ 10:30 AM.   Pt reminded of directions and given parking instructions including gate code.  Routed to Dr. Martinique as Juluis Rainier.

## 2016-11-29 NOTE — Telephone Encounter (Signed)
Patient c/o Palpitations:  High priority if patient c/o lightheadedness and shortness of breath.  1. How long have you been having palpitations? 2-3 weeks  2. Are you currently experiencing lightheadedness and shortness of breath? No oxygen stats 92 94  96 3. Have you checked your BP and heart rate? (document readings) 116 hr 4. Are you experiencing any other symptoms? Pt really tired

## 2016-11-29 NOTE — Telephone Encounter (Signed)
Thanks  Raelyn Ensign

## 2016-11-30 ENCOUNTER — Ambulatory Visit (HOSPITAL_COMMUNITY)
Admission: RE | Admit: 2016-11-30 | Discharge: 2016-11-30 | Disposition: A | Discharge status: Home / Self Care | Payer: Medicare HMO | Source: Ambulatory Visit | Attending: Nurse Practitioner | Admitting: Nurse Practitioner

## 2016-11-30 ENCOUNTER — Encounter (HOSPITAL_COMMUNITY): Payer: Self-pay | Admitting: Nurse Practitioner

## 2016-11-30 VITALS — BP 116/60 | HR 110 | Ht 60.0 in | Wt 169.6 lb

## 2016-11-30 DIAGNOSIS — E119 Type 2 diabetes mellitus without complications: Secondary | ICD-10-CM | POA: Insufficient documentation

## 2016-11-30 DIAGNOSIS — Z79899 Other long term (current) drug therapy: Secondary | ICD-10-CM | POA: Insufficient documentation

## 2016-11-30 DIAGNOSIS — Z90710 Acquired absence of both cervix and uterus: Secondary | ICD-10-CM | POA: Diagnosis not present

## 2016-11-30 DIAGNOSIS — I471 Supraventricular tachycardia: Secondary | ICD-10-CM | POA: Diagnosis not present

## 2016-11-30 DIAGNOSIS — Z808 Family history of malignant neoplasm of other organs or systems: Secondary | ICD-10-CM | POA: Insufficient documentation

## 2016-11-30 DIAGNOSIS — Z9889 Other specified postprocedural states: Secondary | ICD-10-CM | POA: Insufficient documentation

## 2016-11-30 DIAGNOSIS — E785 Hyperlipidemia, unspecified: Secondary | ICD-10-CM | POA: Insufficient documentation

## 2016-11-30 DIAGNOSIS — I10 Essential (primary) hypertension: Secondary | ICD-10-CM | POA: Diagnosis not present

## 2016-11-30 DIAGNOSIS — Z8249 Family history of ischemic heart disease and other diseases of the circulatory system: Secondary | ICD-10-CM | POA: Diagnosis not present

## 2016-11-30 DIAGNOSIS — I2510 Atherosclerotic heart disease of native coronary artery without angina pectoris: Secondary | ICD-10-CM | POA: Diagnosis not present

## 2016-11-30 DIAGNOSIS — Z7901 Long term (current) use of anticoagulants: Secondary | ICD-10-CM | POA: Insufficient documentation

## 2016-11-30 DIAGNOSIS — E118 Type 2 diabetes mellitus with unspecified complications: Secondary | ICD-10-CM | POA: Diagnosis not present

## 2016-11-30 DIAGNOSIS — I481 Persistent atrial fibrillation: Secondary | ICD-10-CM | POA: Diagnosis not present

## 2016-11-30 DIAGNOSIS — I272 Pulmonary hypertension, unspecified: Secondary | ICD-10-CM | POA: Diagnosis not present

## 2016-11-30 DIAGNOSIS — I119 Hypertensive heart disease without heart failure: Secondary | ICD-10-CM | POA: Insufficient documentation

## 2016-11-30 LAB — COMPREHENSIVE METABOLIC PANEL
ALBUMIN: 3.7 g/dL (ref 3.5–5.0)
ALK PHOS: 61 U/L (ref 38–126)
ALT: 17 U/L (ref 14–54)
AST: 21 U/L (ref 15–41)
Anion gap: 5 (ref 5–15)
BILIRUBIN TOTAL: 1 mg/dL (ref 0.3–1.2)
BUN: 19 mg/dL (ref 6–20)
CALCIUM: 9.4 mg/dL (ref 8.9–10.3)
CO2: 30 mmol/L (ref 22–32)
CREATININE: 0.95 mg/dL (ref 0.44–1.00)
Chloride: 104 mmol/L (ref 101–111)
GFR calc Af Amer: >60 mL/min (ref >60)
GFR calc non Af Amer: 54 mL/min — ABNORMAL LOW (ref >60)
GLUCOSE: 122 mg/dL — AB (ref 65–99)
Potassium: 4.2 mmol/L (ref 3.5–5.1)
Sodium: 139 mmol/L (ref 135–145)
TOTAL PROTEIN: 6.6 g/dL (ref 6.5–8.1)

## 2016-11-30 LAB — TSH: TSH: 2.374 u[IU]/mL (ref 0.350–4.500)

## 2016-11-30 LAB — CBC
HEMATOCRIT: 38.7 % (ref 36.0–46.0)
Hemoglobin: 12.9 g/dL (ref 12.0–15.0)
MCH: 32 pg (ref 26.0–34.0)
MCHC: 33.3 g/dL (ref 30.0–36.0)
MCV: 96 fL (ref 78.0–100.0)
Platelets: 160 10*3/uL (ref 150–400)
RBC: 4.03 MIL/uL (ref 3.87–5.11)
RDW: 13.1 % (ref 11.5–15.5)
WBC: 4.1 10*3/uL (ref 4.0–10.5)

## 2016-11-30 LAB — T4, FREE: Free T4: 1.08 ng/dL (ref 0.61–1.12)

## 2016-11-30 LAB — MAGNESIUM: Magnesium: 2.1 mg/dL (ref 1.7–2.4)

## 2016-11-30 MED ORDER — METOPROLOL SUCCINATE ER 25 MG PO TB24: ORAL_TABLET | ORAL | 3 refills | Status: DC

## 2016-11-30 NOTE — Progress Notes (Addendum)
Primary Care Physician: Crist Infante, MD Referring Physician: [facility] ER f/u Cardiologist: Dr. Martinique   Melinda [name] is a 83 y.o. female with a h/o HTN, HLD, CAD, DM type II, PAF on Eliquis; who presented to [facility] several times in a short period of time last winter with afib with RVR, heart failure. She was placed on sotalol in March 2018,and has been doing well in SR until just recently she has noted increase of heart rate to above 100 bpm. States that nothing in her health has changed. No mediation changes.  Today, she denies symptoms of palpitations, chest pain,orthopnea, PND, lower extremity edema, dizziness, presyncope, syncope, or neurologic sequela. Chronic shortness of breath The patient is tolerating medications without difficulties and is otherwise without complaint today.   Past Medical History:  Diagnosis Date  . A-fib ([place])   . Acid reflux   . Anxiety   . Arthritis   . Cataract    surgery,bilateral  . Constipation   . Depression   . Diabetes mellitus without complication (HCC)    Pre diabetes  . Dyspnea on exertion    a. 07/2015 Echo: EF 55-60%, no rwma, mild MR, mod dil LA/RA/RV, mod TR, triv PR, PASP 23mmHg.  . Falls   . Family history of adverse reaction to anesthesia    mother always had n/v  . Hx: UTI (urinary tract infection)   . Hypercholesterolemia   . Hypertensive heart disease   . Insomnia   . Multifactorial gait disorder   . Non-obstructive CAD    a. 07/2015 Cath: LM 30-40d, LAD min irregs, LCX min irregs, RCA 30p, RA 17, RV 33/7, PA 42/19, PCWP 17.  . Osteoarthritis    a. 06/2015 s/p R total hip arthroplasty.  Melinda [name] PAF (paroxysmal atrial fibrillation) ([place])    a. Dx 07/2015, CHA2DS2VASc = 6-->Eliquis.  . Premature atrial contractions   . Pulmonary hypertension ([place])    a. 07/2015 Echo: PASP 96mmHg.  Melinda [name] Pyloric antral stenosis    in childhood,infancy  . Sleep apnea with use of continuous positive airway pressure (CPAP) 10/19/2012   a. compliant w/  CPAP.   Past Surgical History:  Procedure Laterality Date  . ABDOMINAL HYSTERECTOMY  1970  . ABDOMINAL SURGERY     as a 12 day old baby  . CARDIAC CATHETERIZATION N/A 07/14/2015   Procedure: Right/Left Heart Cath and Coronary Angiography;  Surgeon: Sanda Klein, MD;  Location: [place] CV LAB;  Service: Cardiovascular;  Laterality: N/A;  . CARDIOVERSION N/A 11/04/2015   Procedure: CARDIOVERSION;  Surgeon: Lelon Perla, MD;  Location: [facility] ENDOSCOPY;  Service: Cardiovascular;  Laterality: N/A;  . CARDIOVERSION N/A 02/25/2016   Procedure: CARDIOVERSION;  Surgeon: Pixie Casino, MD;  Location: [facility] ENDOSCOPY;  Service: Cardiovascular;  Laterality: N/A;  . COLONOSCOPY    . EYE SURGERY Bilateral    cataract surgery with lens implant  . INNER EAR SURGERY Left    x 3  . JOINT REPLACEMENT Right   . LAPAROSCOPIC SALPINGO OOPHERECTOMY    . NM MYOVIEW LTD  11/30/2007   normal stress nuclear study/EF-83%  . TONSILLECTOMY    . TOTAL HIP ARTHROPLASTY Right 06/26/2015   Procedure: RIGHT TOTAL HIP ARTHROPLASTY ANTERIOR APPROACH;  Surgeon: Leandrew Koyanagi, MD;  Location: [place];  Service: Orthopedics;  Laterality: Right;  . TRANSTHORACIC ECHOCARDIOGRAM  07/23/2009   mild concentric LVH/mild mitral insufficiency/ moderate tricuspid innsufficiency/mild pulmonary HTN/EF- 55-60%  . VAGINAL DELIVERY      Current Outpatient Prescriptions  Medication Sig Dispense Refill  . acetaminophen (TYLENOL) 650 MG CR tablet Take 650-1,300 mg by mouth 2 (two) times daily as needed for pain (arthritis pain).     Melinda [name] alum & mag hydroxide-simeth (MAALOX/MYLANTA) 200-200-20 MG/5ML suspension Take 15 mLs by mouth every 6 (six) hours as needed for indigestion or heartburn.    Melinda [name] amLODipine (NORVASC) 5 MG tablet Take 2.5 mg by mouth daily.     Melinda [name] apixaban (ELIQUIS) 5 MG TABS tablet Take 1 tablet (5 mg total) by mouth 2 (two) times daily. 60 tablet 0  . atorvastatin (LIPITOR) 40 MG tablet Take 40 mg by mouth daily.     . Calcium  Carbonate-Vitamin D3 (CALCIUM 600+D3) 600-400 MG-UNIT TABS Take 1 tablet by mouth daily.    . cholecalciferol (VITAMIN D) 1000 UNITS tablet Take 1,000 Units by mouth daily.     Melinda [name] desipramine (NORPRAMIN) 25 MG tablet Take 25 mg by mouth daily.    . famotidine (PEPCID) 20 MG tablet Take 20 mg by mouth daily.    Melinda [name] glucosamine-chondroitin 500-400 MG tablet Take 1 tablet by mouth every other day.     Melinda [name] KLOR-CON 10 10 MEQ tablet TAKE 2 TABLETS (20 MEQ TOTAL) BY MOUTH DAILY. 60 tablet 6  . linaclotide (LINZESS) 145 MCG CAPS capsule Take 145 mcg by mouth daily before breakfast.     . lisinopril (PRINIVIL,ZESTRIL) 20 MG tablet Take 20 mg by mouth daily.    Melinda [name] LORazepam (ATIVAN) 0.5 MG tablet Take 1 mg by mouth at bedtime.   5  . metoprolol succinate (TOPROL-XL) 25 MG 24 hr tablet Take 1 tablet (25 mg total) by mouth every evening. 30 tablet 3  . Multiple Vitamins-Minerals (OCUVITE PO) Take 1 tablet by mouth daily.    Melinda [name] omeprazole (PRILOSEC) 20 MG capsule Take 20 mg by mouth daily.    [name] Glycol-Propyl Glycol (SYSTANE) 0.4-0.3 % GEL ophthalmic gel Place 1 application into both eyes at bedtime as needed (dry eyes).    . polyethylene glycol (MIRALAX / GLYCOLAX) packet Take 17 g by mouth daily. Mix in 8 oz liquid and drink    . PRESCRIPTION MEDICATION Inhale into the lungs at bedtime. Uses CPAP with oxygen    . sotalol (BETAPACE) 160 MG tablet TAKE 1 TABLET BY MOUTH IN THE MORNING 30 tablet 3  . torsemide (DEMADEX) 10 MG tablet Take 1 tablet (10 mg total) by mouth daily. (Patient taking differently: Take 20 mg by mouth daily. ) 30 tablet 0  . vitamin B-12 (CYANOCOBALAMIN) 1000 MCG tablet Take 1,000 mcg by mouth daily.     No current facility-administered medications for this encounter.     Allergies  Allergen Reactions  . Lasix [Furosemide] Itching  . Phenobarbital Itching  . Amitiza [Lubiprostone] Itching    Social History   Social History  . Marital status: Married    Spouse name: N/A   . Number of children: 1  . Years of education: 75   Occupational History  . Retired    Social History Main Topics  . Smoking status: Never Smoker  . Smokeless tobacco: Never Used  . Alcohol use No     Comment: rare  . Drug use: No  . Sexual activity: Not on file   Other Topics Concern  . Not on file   Social History Narrative   Patient lives alone.    Patient is widowed.    Patient retired.    Patient some college.    Patient has one  child.    Caffeine 1-2 cups daily avg.    Family History  Problem Relation Age of Onset  . Heart attack Father   . Heart failure Mother   . Cancer Mother        colon,kidney  . Diabetes Sister   . Hypertension Sister   . Heart failure Sister     ROS- All systems are reviewed and negative except as per the HPI above  Physical Exam: Vitals:   11/30/16 1034  BP: 116/60  Pulse: (!) 110  Weight: 169 lb 9.6 oz (76.9 kg)  Height: 5' (1.524 m)   Wt Readings from Last 3 Encounters:  11/30/16 169 lb 9.6 oz (76.9 kg)  09/04/16 170 lb (77.1 kg)  08/25/16 170 lb (77.1 kg)    Labs: Lab Results  Component Value Date   NA 139 11/30/2016   K 4.2 11/30/2016   CL 104 11/30/2016   CO2 30 11/30/2016   GLUCOSE 122 (H) 11/30/2016   BUN 19 11/30/2016   CREATININE 0.95 11/30/2016   CALCIUM 9.4 11/30/2016   MG 2.1 11/30/2016   Lab Results  Component Value Date   INR 1.1 10/29/2015   No results found for: CHOL, HDL, LDLCALC, TRIG   GEN- The patient is well appearing, alert and oriented x 3 today.   Head- normocephalic, atraumatic Eyes-  Sclera clear, conjunctiva pink Ears- hearing intact Oropharynx- clear Neck- supple, no JVP Lymph- no cervical lymphadenopathy Lungs- Clear to ausculation bilaterally, normal work of breathing Heart- Regular rate and rhythm, no murmurs, rubs or gallops, PMI not laterally displaced GI- soft, NT, ND, + BS Extremities- no clubbing, cyanosis, or edema MS- no significant deformity or atrophy Skin- no  rash or lesion Psych- euthymic mood, full affect Neuro- strength and sensation are intact  EKG- EKG reviewed with Dr. Rayann Heman, atypical atrial flutter vrs atrial tach.  Assessment and Plan: 1. H/o persistent symptomatic afib On sotalol, has been doing well staying in rhythm, until last week,continue 160 mg qd Increase toprol xl 25 mg am and 12.5 mg pm Continue Eliquis 5 mg bid for chadsvasc score of 5 TSH,T3/T4 today in case thyroid abnormalities are contributing to tachycardia Cmet/cbc  F/u in one week for results of above, if continues in this rhythm will likely need a cardioversion. F/u with Dr. Martinique  10/15 as scheduled   Geroge Baseman. [place], [facility] [address] [place], [place] [address] [phone]

## 2016-11-30 NOTE — Addendum Note (Signed)
Encounter addended by: Sherran Needs, NP on: 11/30/2016  4:16 PM<BR>    Actions taken: LOS modified

## 2016-12-01 LAB — T3, FREE: T3, Free: 2.6 pg/mL (ref 2.0–4.4)

## 2016-12-01 NOTE — Addendum Note (Signed)
Encounter addended by: Sherran Needs, NP on: 12/01/2016  9:12 AM<BR>    Actions taken: Pend clinical note

## 2016-12-02 DIAGNOSIS — R0902 Hypoxemia: Secondary | ICD-10-CM | POA: Diagnosis not present

## 2016-12-02 DIAGNOSIS — G4733 Obstructive sleep apnea (adult) (pediatric): Secondary | ICD-10-CM | POA: Diagnosis not present

## 2016-12-02 DIAGNOSIS — I5043 Acute on chronic combined systolic (congestive) and diastolic (congestive) heart failure: Secondary | ICD-10-CM | POA: Diagnosis not present

## 2016-12-07 ENCOUNTER — Other Ambulatory Visit (HOSPITAL_COMMUNITY): Payer: Self-pay | Admitting: *Deleted

## 2016-12-07 ENCOUNTER — Encounter (HOSPITAL_COMMUNITY): Payer: Self-pay | Admitting: Nurse Practitioner

## 2016-12-07 ENCOUNTER — Ambulatory Visit (HOSPITAL_COMMUNITY)
Admission: RE | Admit: 2016-12-07 | Discharge: 2016-12-07 | Disposition: A | Discharge status: Home / Self Care | Payer: Medicare HMO | Source: Ambulatory Visit | Attending: Nurse Practitioner | Admitting: Nurse Practitioner

## 2016-12-07 VITALS — BP 122/78 | HR 104

## 2016-12-07 DIAGNOSIS — I272 Pulmonary hypertension, unspecified: Secondary | ICD-10-CM | POA: Insufficient documentation

## 2016-12-07 DIAGNOSIS — G4730 Sleep apnea, unspecified: Secondary | ICD-10-CM | POA: Diagnosis not present

## 2016-12-07 DIAGNOSIS — M1990 Unspecified osteoarthritis, unspecified site: Secondary | ICD-10-CM | POA: Diagnosis not present

## 2016-12-07 DIAGNOSIS — G4700 Insomnia, unspecified: Secondary | ICD-10-CM | POA: Diagnosis not present

## 2016-12-07 DIAGNOSIS — Z888 Allergy status to other drugs, medicaments and biological substances status: Secondary | ICD-10-CM | POA: Diagnosis not present

## 2016-12-07 DIAGNOSIS — Z96641 Presence of right artificial hip joint: Secondary | ICD-10-CM | POA: Diagnosis not present

## 2016-12-07 DIAGNOSIS — Z8249 Family history of ischemic heart disease and other diseases of the circulatory system: Secondary | ICD-10-CM | POA: Diagnosis not present

## 2016-12-07 DIAGNOSIS — Z7901 Long term (current) use of anticoagulants: Secondary | ICD-10-CM | POA: Diagnosis not present

## 2016-12-07 DIAGNOSIS — I119 Hypertensive heart disease without heart failure: Secondary | ICD-10-CM | POA: Insufficient documentation

## 2016-12-07 DIAGNOSIS — F329 Major depressive disorder, single episode, unspecified: Secondary | ICD-10-CM | POA: Diagnosis not present

## 2016-12-07 DIAGNOSIS — Z800 Family history of malignant neoplasm of digestive organs: Secondary | ICD-10-CM | POA: Insufficient documentation

## 2016-12-07 DIAGNOSIS — Z87440 Personal history of urinary (tract) infections: Secondary | ICD-10-CM | POA: Insufficient documentation

## 2016-12-07 DIAGNOSIS — E7800 Pure hypercholesterolemia, unspecified: Secondary | ICD-10-CM | POA: Insufficient documentation

## 2016-12-07 DIAGNOSIS — I481 Persistent atrial fibrillation: Secondary | ICD-10-CM | POA: Diagnosis not present

## 2016-12-07 DIAGNOSIS — E119 Type 2 diabetes mellitus without complications: Secondary | ICD-10-CM | POA: Diagnosis not present

## 2016-12-07 DIAGNOSIS — K219 Gastro-esophageal reflux disease without esophagitis: Secondary | ICD-10-CM | POA: Diagnosis not present

## 2016-12-07 DIAGNOSIS — I484 Atypical atrial flutter: Secondary | ICD-10-CM

## 2016-12-07 DIAGNOSIS — Z833 Family history of diabetes mellitus: Secondary | ICD-10-CM | POA: Diagnosis not present

## 2016-12-07 DIAGNOSIS — F419 Anxiety disorder, unspecified: Secondary | ICD-10-CM | POA: Diagnosis not present

## 2016-12-07 DIAGNOSIS — Z79899 Other long term (current) drug therapy: Secondary | ICD-10-CM | POA: Diagnosis not present

## 2016-12-07 DIAGNOSIS — I2510 Atherosclerotic heart disease of native coronary artery without angina pectoris: Secondary | ICD-10-CM | POA: Diagnosis not present

## 2016-12-07 DIAGNOSIS — Z8051 Family history of malignant neoplasm of kidney: Secondary | ICD-10-CM | POA: Diagnosis not present

## 2016-12-07 MED ORDER — METOPROLOL SUCCINATE ER 25 MG PO TB24: 12.5000 mg | ORAL_TABLET | Freq: Every day | ORAL | 3 refills | Status: DC

## 2016-12-07 NOTE — Progress Notes (Signed)
Primary Care Physician: Crist Infante, MD Referring Physician: [facility] ER f/u Cardiologist: Dr. Martinique   Melinda [name] is a 83 y.o. female with a h/o HTN, HLD, CAD, DM type II, PAF on Eliquis; who presented to [facility] several times in a short period of time last winter with afib with RVR, heart failure. She was placed on sotalol in March 2018,and has been doing well in SR until just recently she has noted increase of heart rate to above 100 bpm. States that nothing in her health has changed. No mediation changes.   F/u in afib clinic, 10/2, on last visit, BB was increased but on return today, it did not change or slow rhythm. She has felt more tired over the last couple weeks. She appears to have a atypical atrial flutter vrs atrial tach.   Today, she denies symptoms of palpitations, chest pain,orthopnea, PND, lower extremity edema, dizziness, presyncope, syncope, or neurologic sequela. Chronic shortness of breath, positive for fatigue.The patient is tolerating medications without difficulties and is otherwise without complaint today.   Past Medical History:  Diagnosis Date  . A-fib ([place])   . Acid reflux   . Anxiety   . Arthritis   . Cataract    surgery,bilateral  . Constipation   . Depression   . Diabetes mellitus without complication (HCC)    Pre diabetes  . Dyspnea on exertion    a. 07/2015 Echo: EF 55-60%, no rwma, mild MR, mod dil LA/RA/RV, mod TR, triv PR, PASP 22mmHg.  . Falls   . Family history of adverse reaction to anesthesia    mother always had n/v  . Hx: UTI (urinary tract infection)   . Hypercholesterolemia   . Hypertensive heart disease   . Insomnia   . Multifactorial gait disorder   . Non-obstructive CAD    a. 07/2015 Cath: LM 30-40d, LAD min irregs, LCX min irregs, RCA 30p, RA 17, RV 33/7, PA 42/19, PCWP 17.  . Osteoarthritis    a. 06/2015 s/p R total hip arthroplasty.  [name] PAF (paroxysmal atrial fibrillation) ([place])    a. Dx 07/2015, CHA2DS2VASc = 6-->Eliquis.  .  Premature atrial contractions   . Pulmonary hypertension ([place])    a. 07/2015 Echo: PASP 27mmHg.  [name] Pyloric antral stenosis    in childhood,infancy  . Sleep apnea with use of continuous positive airway pressure (CPAP) 10/19/2012   a. compliant w/ CPAP.   Past Surgical History:  Procedure Laterality Date  . ABDOMINAL HYSTERECTOMY  1970  . ABDOMINAL SURGERY     as a 55 day old baby  . CARDIAC CATHETERIZATION N/A 07/14/2015   Procedure: Right/Left Heart Cath and Coronary Angiography;  Surgeon: Sanda Klein, MD;  Location: [place] CV LAB;  Service: Cardiovascular;  Laterality: N/A;  . CARDIOVERSION N/A 11/04/2015   Procedure: CARDIOVERSION;  Surgeon: Lelon Perla, MD;  Location: [facility] ENDOSCOPY;  Service: Cardiovascular;  Laterality: N/A;  . CARDIOVERSION N/A 02/25/2016   Procedure: CARDIOVERSION;  Surgeon: Pixie Casino, MD;  Location: [facility] ENDOSCOPY;  Service: Cardiovascular;  Laterality: N/A;  . COLONOSCOPY    . EYE SURGERY Bilateral    cataract surgery with lens implant  . INNER EAR SURGERY Left    x 3  . JOINT REPLACEMENT Right   . LAPAROSCOPIC SALPINGO OOPHERECTOMY    . NM MYOVIEW LTD  11/30/2007   normal stress nuclear study/EF-83%  . TONSILLECTOMY    . TOTAL HIP ARTHROPLASTY Right 06/26/2015   Procedure: RIGHT TOTAL HIP ARTHROPLASTY ANTERIOR APPROACH;  Surgeon: Leandrew Koyanagi, MD;  Location: [place];  Service: Orthopedics;  Laterality: Right;  . TRANSTHORACIC ECHOCARDIOGRAM  07/23/2009   mild concentric LVH/mild mitral insufficiency/ moderate tricuspid innsufficiency/mild pulmonary HTN/EF- 55-60%  . VAGINAL DELIVERY      Current Outpatient Prescriptions  Medication Sig Dispense Refill  . acetaminophen (TYLENOL) 650 MG CR tablet Take 650-1,300 mg by mouth 2 (two) times daily as needed for pain (arthritis pain).     [name] alum & mag hydroxide-simeth (MAALOX/MYLANTA) 200-200-20 MG/5ML suspension Take 15 mLs by mouth every 6 (six) hours as needed for indigestion or heartburn.    [name]  amLODipine (NORVASC) 5 MG tablet Take 2.5 mg by mouth daily.     [name] apixaban (ELIQUIS) 5 MG TABS tablet Take 1 tablet (5 mg total) by mouth 2 (two) times daily. 60 tablet 0  . atorvastatin (LIPITOR) 40 MG tablet Take 40 mg by mouth daily.     . Calcium Carbonate-Vitamin D3 (CALCIUM 600+D3) 600-400 MG-UNIT TABS Take 1 tablet by mouth daily.    . cholecalciferol (VITAMIN D) 1000 UNITS tablet Take 1,000 Units by mouth daily.     [name] desipramine (NORPRAMIN) 25 MG tablet Take 25 mg by mouth daily.    . famotidine (PEPCID) 20 MG tablet Take 20 mg by mouth daily.    [name] glucosamine-chondroitin 500-400 MG tablet Take 1 tablet by mouth every other day.     [name] KLOR-CON 10 10 MEQ tablet TAKE 2 TABLETS (20 MEQ TOTAL) BY MOUTH DAILY. 60 tablet 6  . linaclotide (LINZESS) 145 MCG CAPS capsule Take 145 mcg by mouth daily before breakfast.     . lisinopril (PRINIVIL,ZESTRIL) 20 MG tablet Take 20 mg by mouth daily.    [name] LORazepam (ATIVAN) 0.5 MG tablet Take 1 mg by mouth at bedtime.   5  . metoprolol succinate (TOPROL-XL) 25 MG 24 hr tablet Take 0.5 tablets (12.5 mg total) by mouth at bedtime. 45 tablet 3  . Multiple Vitamins-Minerals (OCUVITE PO) Take 1 tablet by mouth daily.    [name] omeprazole (PRILOSEC) 20 MG capsule Take 20 mg by mouth daily.    [name] Glycol-Propyl Glycol (SYSTANE) 0.4-0.3 % GEL ophthalmic gel Place 1 application into both eyes at bedtime as needed (dry eyes).    . polyethylene glycol (MIRALAX / GLYCOLAX) packet Take 17 g by mouth daily. Mix in 8 oz liquid and drink    . PRESCRIPTION MEDICATION Inhale into the lungs at bedtime. Uses CPAP with oxygen    . sotalol (BETAPACE) 160 MG tablet TAKE 1 TABLET BY MOUTH IN THE MORNING 30 tablet 3  . torsemide (DEMADEX) 10 MG tablet Take 1 tablet (10 mg total) by mouth daily. (Patient taking differently: Take 20 mg by mouth daily. ) 30 tablet 0  . vitamin B-12 (CYANOCOBALAMIN) 1000 MCG tablet Take 1,000 mcg by mouth daily.     No current  facility-administered medications for this encounter.     Allergies  Allergen Reactions  . Lasix [Furosemide] Itching  . Phenobarbital Itching  . Amitiza [Lubiprostone] Itching    Social History   Social History  . Marital status: Married    Spouse name: N/A  . Number of children: 1  . Years of education: 24   Occupational History  . Retired    Social History Main Topics  . Smoking status: Never Smoker  . Smokeless tobacco: Never Used  . Alcohol use No     Comment: rare  . Drug use: No  . Sexual  activity: Not on file   Other Topics Concern  . Not on file   Social History Narrative   Patient lives alone.    Patient is widowed.    Patient retired.    Patient some college.    Patient has one child.    Caffeine 1-2 cups daily avg.    Family History  Problem Relation Age of Onset  . Heart attack Father   . Heart failure Mother   . Cancer Mother        colon,kidney  . Diabetes Sister   . Hypertension Sister   . Heart failure Sister     ROS- All systems are reviewed and negative except as per the HPI above  Physical Exam: Vitals:   12/07/16 1128  BP: 122/78  Pulse: (!) 104   Wt Readings from Last 3 Encounters:  11/30/16 169 lb 9.6 oz (76.9 kg)  09/04/16 170 lb (77.1 kg)  08/25/16 170 lb (77.1 kg)    Labs: Lab Results  Component Value Date   NA 139 11/30/2016   K 4.2 11/30/2016   CL 104 11/30/2016   CO2 30 11/30/2016   GLUCOSE 122 (H) 11/30/2016   BUN 19 11/30/2016   CREATININE 0.95 11/30/2016   CALCIUM 9.4 11/30/2016   MG 2.1 11/30/2016   Lab Results  Component Value Date   INR 1.1 10/29/2015   No results found for: CHOL, HDL, LDLCALC, TRIG   GEN- The patient is well appearing, alert and oriented x 3 today.   Head- normocephalic, atraumatic Eyes-  Sclera clear, conjunctiva pink Ears- hearing intact Oropharynx- clear Neck- supple, no JVP Lymph- no cervical lymphadenopathy Lungs- Clear to ausculation bilaterally, normal work of  breathing Heart- Fast regular rate and rhythm, no murmurs, rubs or gallops, PMI not laterally displaced GI- soft, NT, ND, + BS Extremities- no clubbing, cyanosis, or edema MS- no significant deformity or atrophy Skin- no rash or lesion Psych- euthymic mood, full affect Neuro- strength and sensation are intact  EKG- EKG reviewed with Dr. Rayann Heman, atypical atrial flutter vrs atrial tach at 104 bpm,  , qrs int 108 ms, qtc 399 ms  Assessment and Plan: 1. H/o persistent symptomatic afib On sotalol, has been doing well staying in rhythm, until last week,continue 160 mg qd Now appears to be in a atrial tach vrs flutter, symptomatic Will plan for cardioversion 10/8 Continue toprol xl 25 mg am and 12.5 mg pm until 2 days prior to cardioversion and then decrease to 12.5 mg daily as she is slow in SR(low 60's) Continue Eliquis 5 mg bid for chadsvasc score of 5, states no missed doses Recent thyroid panel normal  F/u with Dr. Martinique  10/15 as scheduled   Geroge Baseman. [place], [facility] [address] [place], [place] [address] [phone]

## 2016-12-07 NOTE — Progress Notes (Signed)
Pt in for EKG today.  EKG to be reviewed by Roderic Palau, NP

## 2016-12-07 NOTE — Patient Instructions (Addendum)
Cardioversion scheduled for Monday, October 8th  - Arrive at the [facility] and go to admitting at 8:30AM  -Do not eat or drink anything after midnight the night prior to your procedure.  - Take all your medication with a sip of water prior to arrival.  - You will not be able to drive home after your procedure.  On Saturday return to your normal dosing of metoprolol of 1/2 tablet at bedtime.

## 2016-12-13 ENCOUNTER — Ambulatory Visit (HOSPITAL_COMMUNITY): Payer: Medicare HMO | Admitting: Certified Registered Nurse Anesthetist

## 2016-12-13 ENCOUNTER — Encounter (HOSPITAL_COMMUNITY)
Admission: RE | Disposition: A | Discharge status: Home / Self Care | Payer: Self-pay | Source: Ambulatory Visit | Attending: Internal Medicine

## 2016-12-13 ENCOUNTER — Encounter (HOSPITAL_COMMUNITY): Payer: Self-pay | Admitting: Certified Registered Nurse Anesthetist

## 2016-12-13 ENCOUNTER — Ambulatory Visit (HOSPITAL_COMMUNITY)
Admission: RE | Admit: 2016-12-13 | Discharge: 2016-12-13 | Disposition: A | Discharge status: Home / Self Care | Payer: Medicare HMO | Source: Ambulatory Visit | Attending: Internal Medicine | Admitting: Internal Medicine

## 2016-12-13 DIAGNOSIS — E785 Hyperlipidemia, unspecified: Secondary | ICD-10-CM | POA: Diagnosis not present

## 2016-12-13 DIAGNOSIS — I4891 Unspecified atrial fibrillation: Secondary | ICD-10-CM

## 2016-12-13 DIAGNOSIS — F419 Anxiety disorder, unspecified: Secondary | ICD-10-CM | POA: Diagnosis not present

## 2016-12-13 DIAGNOSIS — I2510 Atherosclerotic heart disease of native coronary artery without angina pectoris: Secondary | ICD-10-CM | POA: Diagnosis not present

## 2016-12-13 DIAGNOSIS — Z96641 Presence of right artificial hip joint: Secondary | ICD-10-CM | POA: Insufficient documentation

## 2016-12-13 DIAGNOSIS — R69 Illness, unspecified: Secondary | ICD-10-CM | POA: Diagnosis not present

## 2016-12-13 DIAGNOSIS — I10 Essential (primary) hypertension: Secondary | ICD-10-CM | POA: Diagnosis not present

## 2016-12-13 DIAGNOSIS — I509 Heart failure, unspecified: Secondary | ICD-10-CM | POA: Diagnosis not present

## 2016-12-13 DIAGNOSIS — G4730 Sleep apnea, unspecified: Secondary | ICD-10-CM | POA: Insufficient documentation

## 2016-12-13 DIAGNOSIS — K219 Gastro-esophageal reflux disease without esophagitis: Secondary | ICD-10-CM | POA: Diagnosis not present

## 2016-12-13 DIAGNOSIS — F329 Major depressive disorder, single episode, unspecified: Secondary | ICD-10-CM | POA: Insufficient documentation

## 2016-12-13 DIAGNOSIS — E119 Type 2 diabetes mellitus without complications: Secondary | ICD-10-CM | POA: Diagnosis not present

## 2016-12-13 DIAGNOSIS — I480 Paroxysmal atrial fibrillation: Secondary | ICD-10-CM | POA: Diagnosis not present

## 2016-12-13 DIAGNOSIS — I481 Persistent atrial fibrillation: Secondary | ICD-10-CM | POA: Insufficient documentation

## 2016-12-13 DIAGNOSIS — Z7901 Long term (current) use of anticoagulants: Secondary | ICD-10-CM | POA: Insufficient documentation

## 2016-12-13 DIAGNOSIS — Z9989 Dependence on other enabling machines and devices: Secondary | ICD-10-CM | POA: Insufficient documentation

## 2016-12-13 DIAGNOSIS — I110 Hypertensive heart disease with heart failure: Secondary | ICD-10-CM | POA: Diagnosis not present

## 2016-12-13 DIAGNOSIS — Z79899 Other long term (current) drug therapy: Secondary | ICD-10-CM | POA: Insufficient documentation

## 2016-12-13 DIAGNOSIS — E7800 Pure hypercholesterolemia, unspecified: Secondary | ICD-10-CM | POA: Diagnosis not present

## 2016-12-13 HISTORY — PX: CARDIOVERSION: SHX1299

## 2016-12-13 LAB — POCT I-STAT 4, (NA,K, GLUC, HGB,HCT)
Glucose, Bld: 139 mg/dL — ABNORMAL HIGH (ref 65–99)
HEMATOCRIT: 38 % (ref 36.0–46.0)
HEMOGLOBIN: 12.9 g/dL (ref 12.0–15.0)
POTASSIUM: 3.6 mmol/L (ref 3.5–5.1)
Sodium: 142 mmol/L (ref 135–145)

## 2016-12-13 SURGERY — CARDIOVERSION
Anesthesia: General

## 2016-12-13 MED ORDER — GLYCOPYRROLATE 0.2 MG/ML IJ SOLN
INTRAMUSCULAR | Status: DC | PRN
  Administered 2016-12-13 (×2): 0.2 mg via INTRAVENOUS

## 2016-12-13 MED ORDER — LIDOCAINE 2% (20 MG/ML) 5 ML SYRINGE
INTRAMUSCULAR | Status: DC | PRN
  Administered 2016-12-13: 60 mg via INTRAVENOUS

## 2016-12-13 MED ORDER — ATROPINE SULFATE 0.4 MG/ML IJ SOLN
INTRAMUSCULAR | Status: DC | PRN
  Administered 2016-12-13: 0.2 mg via INTRAVENOUS

## 2016-12-13 MED ORDER — EPHEDRINE SULFATE 50 MG/ML IJ SOLN
INTRAMUSCULAR | Status: DC | PRN
  Administered 2016-12-13: 15 mg via INTRAVENOUS
  Administered 2016-12-13: 10 mg via INTRAVENOUS

## 2016-12-13 MED ORDER — PROPOFOL 10 MG/ML IV BOLUS
INTRAVENOUS | Status: DC | PRN
  Administered 2016-12-13: 40 mg via INTRAVENOUS

## 2016-12-13 MED ORDER — SODIUM CHLORIDE 0.9 % IV SOLN
INTRAVENOUS | Status: DC
  Administered 2016-12-13: 09:00:00 via INTRAVENOUS

## 2016-12-13 NOTE — Interval H&P Note (Signed)
History and Physical Interval Note:  12/13/2016 8:56 AM  Melinda [name]  has presented today for surgery, with the diagnosis of A-FIB  The various methods of treatment have been discussed with the patient and family. After consideration of risks, benefits and other options for treatment, the patient has consented to  Procedure(s): CARDIOVERSION (N/A) as a surgical intervention .  The patient's history has been reviewed, patient examined, no change in status, stable for surgery.  I have reviewed the patient's chart and labs.  Questions were answered to the patient's satisfaction.     Dorris Carnes

## 2016-12-13 NOTE — Anesthesia Postprocedure Evaluation (Signed)
Anesthesia Post Note  Patient: Melinda [name]  Procedure(s) Performed: CARDIOVERSION (N/A )     Patient location during evaluation: Endoscopy Anesthesia Type: General Level of consciousness: awake Pain management: pain level controlled Vital Signs Assessment: post-procedure vital signs reviewed and stable Respiratory status: spontaneous breathing Cardiovascular status: stable Postop Assessment: no apparent nausea or vomiting Anesthetic complications: no    Last Vitals:  Vitals:   12/13/16 1040 12/13/16 1055  BP: (!) 90/52 120/62  Pulse: (!) 57 (!) 58  Resp: (!) 24 (!) 26  Temp:    SpO2: 94% 100%    Last Pain:  Vitals:   12/13/16 1025  TempSrc: Oral   Pain Goal:                 Meiya Wisler JR,JOHN Mi Balla

## 2016-12-13 NOTE — CV Procedure (Signed)
Cardioversion  Patient anesthetized with propofol and lidocaine WIth pads in AP position patient cardioverted to sinus bradycardia with 200 J synchronized biphasic energy Post procedure pt was bradycardic and hypotensive.  Rx by anesthesia with Robinal and Ephedrine  Following BP prior to D/C.

## 2016-12-13 NOTE — Transfer of Care (Signed)
Immediate Anesthesia Transfer of Care Note  Patient: Melinda [name]  Procedure(s) Performed: CARDIOVERSION (N/A )  Patient Location: PACU and Endoscopy Unit  Anesthesia Type:General  Level of Consciousness: awake, alert , oriented and patient cooperative  Airway & Oxygen Therapy: Patient Spontanous Breathing and Patient connected to nasal cannula oxygen  Post-op Assessment: Report given to RN, Post -op Vital signs reviewed and stable and Patient moving all extremities X 4  Post vital signs: Reviewed and stable  Last Vitals:  Vitals:   12/13/16 0827  BP: (!) 121/97  Pulse: (!) 105  Resp: (!) 21  Temp: 36.4 C  SpO2: 97%    Last Pain:  Vitals:   12/13/16 0827  TempSrc: Oral         Complications: No apparent anesthesia complications   Dr. Jillyn Hidden at bedside the entire procedure

## 2016-12-13 NOTE — Anesthesia Preprocedure Evaluation (Signed)
Anesthesia Evaluation  Patient identified by MRN, date of birth, ID band Patient awake    Reviewed: Allergy & Precautions, NPO status , Patient's Chart, lab work & pertinent test results, reviewed documented beta blocker date and time   Airway Mallampati: II  TM Distance: >3 FB Neck ROM: Full    Dental  (+) Teeth Intact   Pulmonary sleep apnea ,    breath sounds clear to auscultation       Cardiovascular hypertension, Pt. on medications and Pt. on home beta blockers + CAD   Rhythm:Irregular Rate:Tachycardia     Neuro/Psych    GI/Hepatic GERD  ,  Endo/Other  diabetes  Renal/GU      Musculoskeletal   Abdominal   Peds  Hematology   Anesthesia Other Findings   Reproductive/Obstetrics                             Anesthesia Physical  Anesthesia Plan  ASA: III  Anesthesia Plan: General   Post-op Pain Management:    Induction: Intravenous  PONV Risk Score and Plan: 3 and Treatment may vary due to age or medical condition  Airway Management Planned: Mask  Additional Equipment:   Intra-op Plan:   Post-operative Plan:   Informed Consent: I have reviewed the patients History and Physical, chart, labs and discussed the procedure including the risks, benefits and alternatives for the proposed anesthesia with the patient or authorized representative who has indicated his/her understanding and acceptance.     Plan Discussed with: CRNA and Surgeon  Anesthesia Plan Comments:         Anesthesia Quick Evaluation

## 2016-12-13 NOTE — Discharge Instructions (Signed)
Electrical Cardioversion, Care After °This sheet gives you information about how to care for yourself after your procedure. Your health care provider may also give you more specific instructions. If you have problems or questions, contact your health care provider. °What can I expect after the procedure? °After the procedure, it is common to have: °· Some redness on the skin where the shocks were given. ° °Follow these instructions at home: °· Do not drive for 24 hours if you were given a medicine to help you relax (sedative). °· Take over-the-counter and prescription medicines only as told by your health care provider. °· Ask your health care provider how to check your pulse. Check it often. °· Rest for 48 hours after the procedure or as told by your health care provider. °· Avoid or limit your caffeine use as told by your health care provider. °Contact a health care provider if: °· You feel like your heart is beating too quickly or your pulse is not regular. °· You have a serious muscle cramp that does not go away. °Get help right away if: °· You have discomfort in your chest. °· You are dizzy or you feel faint. °· You have trouble breathing or you are short of breath. °· Your speech is slurred. °· You have trouble moving an arm or leg on one side of your body. °· Your fingers or toes turn cold or blue. °This information is not intended to replace advice given to you by your health care provider. Make sure you discuss any questions you have with your health care provider. °Document Released: 12/13/2012 Document Revised: 09/26/2015 Document Reviewed: 08/29/2015 °Elsevier Interactive Patient Education © 2018 [employer] Inc. ° °

## 2016-12-13 NOTE — H&P (View-Only) (Signed)
Primary Care Physician: Crist Infante, MD Referring Physician: [facility] ER f/u Cardiologist: Dr. Martinique   Melinda [name] is a 83 y.o. female with a h/o HTN, HLD, CAD, DM type II, PAF on Eliquis; who presented to [facility] several times in a short period of time last winter with afib with RVR, heart failure. She was placed on sotalol in March 2018,and has been doing well in SR until just recently she has noted increase of heart rate to above 100 bpm. States that nothing in her health has changed. No mediation changes.   F/u in afib clinic, 10/2, on last visit, BB was increased but on return today, it did not change or slow rhythm. She has felt more tired over the last couple weeks. She appears to have a atypical atrial flutter vrs atrial tach.   Today, she denies symptoms of palpitations, chest pain,orthopnea, PND, lower extremity edema, dizziness, presyncope, syncope, or neurologic sequela. Chronic shortness of breath, positive for fatigue.The patient is tolerating medications without difficulties and is otherwise without complaint today.   Past Medical History:  Diagnosis Date  . A-fib ([place])   . Acid reflux   . Anxiety   . Arthritis   . Cataract    surgery,bilateral  . Constipation   . Depression   . Diabetes mellitus without complication (HCC)    Pre diabetes  . Dyspnea on exertion    a. 07/2015 Echo: EF 55-60%, no rwma, mild MR, mod dil LA/RA/RV, mod TR, triv PR, PASP 46mmHg.  . Falls   . Family history of adverse reaction to anesthesia    mother always had n/v  . Hx: UTI (urinary tract infection)   . Hypercholesterolemia   . Hypertensive heart disease   . Insomnia   . Multifactorial gait disorder   . Non-obstructive CAD    a. 07/2015 Cath: LM 30-40d, LAD min irregs, LCX min irregs, RCA 30p, RA 17, RV 33/7, PA 42/19, PCWP 17.  . Osteoarthritis    a. 06/2015 s/p R total hip arthroplasty.  [name] PAF (paroxysmal atrial fibrillation) ([place])    a. Dx 07/2015, CHA2DS2VASc = 6-->Eliquis.  .  Premature atrial contractions   . Pulmonary hypertension ([place])    a. 07/2015 Echo: PASP 48mmHg.  [name] Pyloric antral stenosis    in childhood,infancy  . Sleep apnea with use of continuous positive airway pressure (CPAP) 10/19/2012   a. compliant w/ CPAP.   Past Surgical History:  Procedure Laterality Date  . ABDOMINAL HYSTERECTOMY  1970  . ABDOMINAL SURGERY     as a 69 day old baby  . CARDIAC CATHETERIZATION N/A 07/14/2015   Procedure: Right/Left Heart Cath and Coronary Angiography;  Surgeon: Sanda Klein, MD;  Location: [facility] CV LAB;  Service: Cardiovascular;  Laterality: N/A;  . CARDIOVERSION N/A 11/04/2015   Procedure: CARDIOVERSION;  Surgeon: Lelon Perla, MD;  Location: [facility] ENDOSCOPY;  Service: Cardiovascular;  Laterality: N/A;  . CARDIOVERSION N/A 02/25/2016   Procedure: CARDIOVERSION;  Surgeon: Pixie Casino, MD;  Location: [facility] ENDOSCOPY;  Service: Cardiovascular;  Laterality: N/A;  . COLONOSCOPY    . EYE SURGERY Bilateral    cataract surgery with lens implant  . INNER EAR SURGERY Left    x 3  . JOINT REPLACEMENT Right   . LAPAROSCOPIC SALPINGO OOPHERECTOMY    . NM MYOVIEW LTD  11/30/2007   normal stress nuclear study/EF-83%  . TONSILLECTOMY    . TOTAL HIP ARTHROPLASTY Right 06/26/2015   Procedure: RIGHT TOTAL HIP ARTHROPLASTY ANTERIOR APPROACH;  Surgeon: Leandrew Koyanagi, MD;  Location: [facility];  Service: Orthopedics;  Laterality: Right;  . TRANSTHORACIC ECHOCARDIOGRAM  07/23/2009   mild concentric LVH/mild mitral insufficiency/ moderate tricuspid innsufficiency/mild pulmonary HTN/EF- 55-60%  . VAGINAL DELIVERY      Current Outpatient Prescriptions  Medication Sig Dispense Refill  . acetaminophen (TYLENOL) 650 MG CR tablet Take 650-1,300 mg by mouth 2 (two) times daily as needed for pain (arthritis pain).     [name] alum & mag hydroxide-simeth (MAALOX/MYLANTA) 200-200-20 MG/5ML suspension Take 15 mLs by mouth every 6 (six) hours as needed for indigestion or heartburn.    [name]  amLODipine (NORVASC) 5 MG tablet Take 2.5 mg by mouth daily.     [name] apixaban (ELIQUIS) 5 MG TABS tablet Take 1 tablet (5 mg total) by mouth 2 (two) times daily. 60 tablet 0  . atorvastatin (LIPITOR) 40 MG tablet Take 40 mg by mouth daily.     . Calcium Carbonate-Vitamin D3 (CALCIUM 600+D3) 600-400 MG-UNIT TABS Take 1 tablet by mouth daily.    . cholecalciferol (VITAMIN D) 1000 UNITS tablet Take 1,000 Units by mouth daily.     [name] desipramine (NORPRAMIN) 25 MG tablet Take 25 mg by mouth daily.    . famotidine (PEPCID) 20 MG tablet Take 20 mg by mouth daily.    [name] glucosamine-chondroitin 500-400 MG tablet Take 1 tablet by mouth every other day.     [name] KLOR-CON 10 10 MEQ tablet TAKE 2 TABLETS (20 MEQ TOTAL) BY MOUTH DAILY. 60 tablet 6  . linaclotide (LINZESS) 145 MCG CAPS capsule Take 145 mcg by mouth daily before breakfast.     . lisinopril (PRINIVIL,ZESTRIL) 20 MG tablet Take 20 mg by mouth daily.    [name] LORazepam (ATIVAN) 0.5 MG tablet Take 1 mg by mouth at bedtime.   5  . metoprolol succinate (TOPROL-XL) 25 MG 24 hr tablet Take 0.5 tablets (12.5 mg total) by mouth at bedtime. 45 tablet 3  . Multiple Vitamins-Minerals (OCUVITE PO) Take 1 tablet by mouth daily.    [name] omeprazole (PRILOSEC) 20 MG capsule Take 20 mg by mouth daily.    [name] Glycol-Propyl Glycol (SYSTANE) 0.4-0.3 % GEL ophthalmic gel Place 1 application into both eyes at bedtime as needed (dry eyes).    . polyethylene glycol (MIRALAX / GLYCOLAX) packet Take 17 g by mouth daily. Mix in 8 oz liquid and drink    . PRESCRIPTION MEDICATION Inhale into the lungs at bedtime. Uses CPAP with oxygen    . sotalol (BETAPACE) 160 MG tablet TAKE 1 TABLET BY MOUTH IN THE MORNING 30 tablet 3  . torsemide (DEMADEX) 10 MG tablet Take 1 tablet (10 mg total) by mouth daily. (Patient taking differently: Take 20 mg by mouth daily. ) 30 tablet 0  . vitamin B-12 (CYANOCOBALAMIN) 1000 MCG tablet Take 1,000 mcg by mouth daily.     No current  facility-administered medications for this encounter.     Allergies  Allergen Reactions  . Lasix [Furosemide] Itching  . Phenobarbital Itching  . Amitiza [Lubiprostone] Itching    Social History   Social History  . Marital status: Married    Spouse name: N/A  . Number of children: 1  . Years of education: 17   Occupational History  . Retired    Social History Main Topics  . Smoking status: Never Smoker  . Smokeless tobacco: Never Used  . Alcohol use No     Comment: rare  . Drug use: No  . Sexual  activity: Not on file   Other Topics Concern  . Not on file   Social History Narrative   Patient lives alone.    Patient is widowed.    Patient retired.    Patient some college.    Patient has one child.    Caffeine 1-2 cups daily avg.    Family History  Problem Relation Age of Onset  . Heart attack Father   . Heart failure Mother   . Cancer Mother        colon,kidney  . Diabetes Sister   . Hypertension Sister   . Heart failure Sister     ROS- All systems are reviewed and negative except as per the HPI above  Physical Exam: Vitals:   12/07/16 1128  BP: 122/78  Pulse: (!) 104   Wt Readings from Last 3 Encounters:  11/30/16 169 lb 9.6 oz (76.9 kg)  09/04/16 170 lb (77.1 kg)  08/25/16 170 lb (77.1 kg)    Labs: Lab Results  Component Value Date   NA 139 11/30/2016   K 4.2 11/30/2016   CL 104 11/30/2016   CO2 30 11/30/2016   GLUCOSE 122 (H) 11/30/2016   BUN 19 11/30/2016   CREATININE 0.95 11/30/2016   CALCIUM 9.4 11/30/2016   MG 2.1 11/30/2016   Lab Results  Component Value Date   INR 1.1 10/29/2015   No results found for: CHOL, HDL, LDLCALC, TRIG   GEN- The patient is well appearing, alert and oriented x 3 today.   Head- normocephalic, atraumatic Eyes-  Sclera clear, conjunctiva pink Ears- hearing intact Oropharynx- clear Neck- supple, no JVP Lymph- no cervical lymphadenopathy Lungs- Clear to ausculation bilaterally, normal work of  breathing Heart- Fast regular rate and rhythm, no murmurs, rubs or gallops, PMI not laterally displaced GI- soft, NT, ND, + BS Extremities- no clubbing, cyanosis, or edema MS- no significant deformity or atrophy Skin- no rash or lesion Psych- euthymic mood, full affect Neuro- strength and sensation are intact  EKG- EKG reviewed with Dr. Rayann Heman, atypical atrial flutter vrs atrial tach at 104 bpm,  , qrs int 108 ms, qtc 399 ms  Assessment and Plan: 1. H/o persistent symptomatic afib On sotalol, has been doing well staying in rhythm, until last week,continue 160 mg qd Now appears to be in a atrial tach vrs flutter, symptomatic Will plan for cardioversion 10/8 Continue toprol xl 25 mg am and 12.5 mg pm until 2 days prior to cardioversion and then decrease to 12.5 mg daily as she is slow in SR(low 60's) Continue Eliquis 5 mg bid for chadsvasc score of 5, states no missed doses Recent thyroid panel normal  F/u with Dr. Martinique  10/15 as scheduled   Geroge Baseman. Allene Furuya, [facility] [address] [place], [place] [address] [phone]

## 2016-12-14 ENCOUNTER — Encounter (HOSPITAL_COMMUNITY): Payer: Self-pay | Admitting: Internal Medicine

## 2016-12-16 NOTE — Progress Notes (Signed)
Cardiology Office Note   Date:  12/20/2016   ID:  Melinda [name], DOB [birth year], MRN [id number]  PCP:  Crist Infante, MD  Cardiologist:   Syair Fricker Martinique, MD   Chief Complaint  Patient presents with  . Follow-up    6 months;  . Atrial Flutter      History of Present Illness: Melinda [name] is a 83 y.o. female who presents for follow up of Afib and dyspnea.  She was seen by me in 2009. At that time she had a normal stress Myoview study. Echo showed mild LVH with normal EF. Mild pulmonary HTN.   She was seen in early May 2017 with symptoms of increased dyspnea on exertion. No cough, wheezing, edema. Weight has been stable.  On one occasion she walked to her mailbox and developed a heavy weight on her chest. She is s/p a right THR. She does have a history of OSA and is followed by Dr. Brett Fairy and using CPAP.    After my visit in early May 2017 she was scheduled for a repeat Echo and a Myoview study. Before these could be done she was admitted May 7-9 with acute chest pain. She was found to be in Atrial flutter with controlled rate (new since April). She ruled out for MI. Echo showed normal LV function with mild LVH, mild MR, moderate LA, RA, and RV enlargement with pulmonary HTN estimated at 50 mm Hg. She underwent right and left heart cath that showed nonobstructive CAD, mild pulmonary HTN- more consistent with pulmonary arterial hypertension. CT of the chest was normal with no evidence of PE. She had subsequent PFTs as an outpatient showing normal volumes with mild obstruction and decreased diffusion capacity. She was started on diuretic therapy and anticoagulated. She underwent successful DCCV on November 04, 2015.   When seen again in November 2017 she had recurrent Afib. Beta blocker dose was increased and she had repeat DCCV on December 20. She was admitted 4 days later with hypoxia and acute respiratory distress. ? LLL PNA. Treated with antibiotics and gently diuresed. Echo  showed normal LV function. She had severe biatrial enlargement and pulmonary HTN. Seen back in ED on 03/11/16 with recurrent Afib. Rate controlled. CXR showed no infiltrates. CT chest without acute findings.  Seen for follow up in Afib clinic. It was felt that she was not a candidate for Tikosyn due to cost. Tier 4. Due to the fact that she was significantly symptomatic with Afib she was admitted in March for initiation of sotalol therapy. DC on 160 mg bid.  She is still on eliquis.  She was seen in late September with symptoms of tachycardia and fatigue. Was found to be in atrial flutter versus atrial tachy. Beta blocker increased without improvement. Underwent DCCV on October 8 successfully.   On follow up today she is keeping a diary of BP and HR and since DCCV HR has stayed down. She still complains of DOE walking up hill or when in a hurry. No edema. No chest pain but last night had acute pain in her ears, jaw, and teeth. This lasted 1 minute then resolved.     Past Medical History:  Diagnosis Date  . A-fib ([place])   . Acid reflux   . Anxiety   . Arthritis   . Cataract    surgery,bilateral  . Constipation   . Depression   . Diabetes mellitus without complication (HCC)    Pre diabetes  . Dyspnea on  exertion    a. 07/2015 Echo: EF 55-60%, no rwma, mild MR, mod dil LA/RA/RV, mod TR, triv PR, PASP 88mmHg.  . Falls   . Family history of adverse reaction to anesthesia    mother always had n/v  . Hx: UTI (urinary tract infection)   . Hypercholesterolemia   . Hypertensive heart disease   . Insomnia   . Multifactorial gait disorder   . Non-obstructive CAD    a. 07/2015 Cath: LM 30-40d, LAD min irregs, LCX min irregs, RCA 30p, RA 17, RV 33/7, PA 42/19, PCWP 17.  . Osteoarthritis    a. 06/2015 s/p R total hip arthroplasty.  [name] PAF (paroxysmal atrial fibrillation) ([place])    a. Dx 07/2015, CHA2DS2VASc = 6-->Eliquis.  . Premature atrial contractions   . Pulmonary hypertension ([place])    a. 07/2015  Echo: PASP 14mmHg.  [name] Pyloric antral stenosis    in childhood,infancy  . Sleep apnea with use of continuous positive airway pressure (CPAP) 10/19/2012   a. compliant w/ CPAP.    Past Surgical History:  Procedure Laterality Date  . ABDOMINAL HYSTERECTOMY  1970  . ABDOMINAL SURGERY     as a 39 day old baby  . CARDIAC CATHETERIZATION N/A 07/14/2015   Procedure: Right/Left Heart Cath and Coronary Angiography;  Surgeon: Sanda Klein, MD;  Location: [place] CV LAB;  Service: Cardiovascular;  Laterality: N/A;  . CARDIOVERSION N/A 11/04/2015   Procedure: CARDIOVERSION;  Surgeon: Lelon Perla, MD;  Location: [facility] ENDOSCOPY;  Service: Cardiovascular;  Laterality: N/A;  . CARDIOVERSION N/A 02/25/2016   Procedure: CARDIOVERSION;  Surgeon: Pixie Casino, MD;  Location: [facility] ENDOSCOPY;  Service: Cardiovascular;  Laterality: N/A;  . CARDIOVERSION N/A 12/13/2016   Procedure: CARDIOVERSION;  Surgeon: Fay Records, MD;  Location: [place];  Service: Cardiovascular;  Laterality: N/A;  . COLONOSCOPY    . EYE SURGERY Bilateral    cataract surgery with lens implant  . INNER EAR SURGERY Left    x 3  . JOINT REPLACEMENT Right   . LAPAROSCOPIC SALPINGO OOPHERECTOMY    . NM MYOVIEW LTD  11/30/2007   normal stress nuclear study/EF-83%  . TONSILLECTOMY    . TOTAL HIP ARTHROPLASTY Right 06/26/2015   Procedure: RIGHT TOTAL HIP ARTHROPLASTY ANTERIOR APPROACH;  Surgeon: Leandrew Koyanagi, MD;  Location: [place];  Service: Orthopedics;  Laterality: Right;  . TRANSTHORACIC ECHOCARDIOGRAM  07/23/2009   mild concentric LVH/mild mitral insufficiency/ moderate tricuspid innsufficiency/mild pulmonary HTN/EF- 55-60%  . VAGINAL DELIVERY       Current Outpatient Prescriptions  Medication Sig Dispense Refill  . acetaminophen (TYLENOL) 650 MG CR tablet Take 650-1,300 mg by mouth 2 (two) times daily as needed for pain (arthritis pain).     [name] alum & mag hydroxide-simeth (MAALOX/MYLANTA) 200-200-20 MG/5ML suspension Take 15  mLs by mouth every 6 (six) hours as needed for indigestion or heartburn.    [name] amLODipine (NORVASC) 5 MG tablet Take 2.5 mg by mouth daily.     [name] apixaban (ELIQUIS) 5 MG TABS tablet Take 1 tablet (5 mg total) by mouth 2 (two) times daily. 60 tablet 0  . atorvastatin (LIPITOR) 40 MG tablet Take 40 mg by mouth daily.     . Calcium Carbonate-Vitamin D3 (CALCIUM 600+D3) 600-400 MG-UNIT TABS Take 1 tablet by mouth daily.    . cholecalciferol (VITAMIN D) 1000 UNITS tablet Take 1,000 Units by mouth daily.     [name] desipramine (NORPRAMIN) 25 MG tablet Take 25 mg by mouth daily.    [name]  famotidine (PEPCID) 20 MG tablet Take 20 mg by mouth daily.    [name] glucosamine-chondroitin 500-400 MG tablet Take 1 tablet by mouth every other day.     [name] KLOR-CON 10 10 MEQ tablet TAKE 2 TABLETS (20 MEQ TOTAL) BY MOUTH DAILY. 60 tablet 6  . linaclotide (LINZESS) 145 MCG CAPS capsule Take 145 mcg by mouth daily before breakfast.     . lisinopril (PRINIVIL,ZESTRIL) 20 MG tablet Take 20 mg by mouth daily.    [name] LORazepam (ATIVAN) 0.5 MG tablet Take 1 mg by mouth at bedtime.   5  . metoprolol succinate (TOPROL-XL) 25 MG 24 hr tablet Take 0.5 tablets (12.5 mg total) by mouth at bedtime. 45 tablet 3  . Multiple Vitamins-Minerals (OCUVITE PO) Take 1 tablet by mouth daily.    [name] omeprazole (PRILOSEC) 20 MG capsule Take 20 mg by mouth daily.    [name] Glycol-Propyl Glycol (SYSTANE) 0.4-0.3 % GEL ophthalmic gel Place 1 application into both eyes at bedtime as needed (dry eyes).    . polyethylene glycol (MIRALAX / GLYCOLAX) packet Take 17 g by mouth daily. Mix in 8 oz liquid and drink    . PRESCRIPTION MEDICATION Inhale into the lungs at bedtime. Uses CPAP with oxygen    . sotalol (BETAPACE) 160 MG tablet TAKE 1 TABLET BY MOUTH IN THE MORNING 30 tablet 3  . torsemide (DEMADEX) 10 MG tablet Take 1 tablet (10 mg total) by mouth daily. (Patient taking differently: Take 20 mg by mouth every other day. ) 30 tablet 0  . vitamin B-12  (CYANOCOBALAMIN) 1000 MCG tablet Take 1,000 mcg by mouth daily.     No current facility-administered medications for this visit.     Allergies:   Lasix [furosemide]; Phenobarbital; and Amitiza [lubiprostone]    Social History:  The patient  reports that she has never smoked. She has never used smokeless tobacco. She reports that she does not drink alcohol or use drugs.   Family History:  The patient's family history includes Cancer in her mother; Diabetes in her sister; Heart attack in her father; Heart failure in her mother and sister; Hypertension in her sister.    ROS:  Please see the history of present illness.   Otherwise, review of systems are positive for none.   All other systems are reviewed and negative.    PHYSICAL EXAM: VS:  BP (!) 148/71   Pulse 61   Ht 5' (1.524 m)   Wt 171 lb (77.6 kg)   BMI 33.40 kg/m  , BMI Body mass index is 33.4 kg/m. GENERAL:  Well appearing, obese WF in NAD HEENT:  PERRL, EOMI, sclera are clear. Oropharynx is clear. NECK:  No jugular venous distention, carotid upstroke brisk and symmetric, no bruits, no thyromegaly or adenopathy LUNGS:  Clear to auscultation bilaterally CHEST:  Unremarkable HEART:  RRR,  PMI not displaced or sustained,S1 and S2 within normal limits, no S3, no S4: no clicks, no rubs, no murmurs ABD:  Soft, nontender. BS +, no masses or bruits. No hepatomegaly, no splenomegaly EXT:  2 + pulses throughout, no edema, no cyanosis no clubbing SKIN:  Warm and dry.  No rashes NEURO:  Alert and oriented x 3. Cranial nerves II through XII intact. PSYCH:  Cognitively intact     EKG:  EKG is not ordered today.   Recent Labs: 02/29/2016: B Natriuretic Peptide 948.9 11/30/2016: ALT 17; BUN 19; Creatinine, Ser 0.95; Magnesium 2.1; Platelets 160; TSH 2.374 12/13/2016: Hemoglobin 12.9; Potassium 3.6;  Sodium 142    Lipid Panel No results found for: CHOL, TRIG, HDL, CHOLHDL, VLDL, LDLCALC, LDLDIRECT    Wt Readings from Last 3  Encounters:  12/20/16 171 lb (77.6 kg)  12/13/16 169 lb (76.7 kg)  11/30/16 169 lb 9.6 oz (76.9 kg)      Other studies Reviewed:  Lab Results  Component Value Date   WBC 4.1 11/30/2016   HGB 12.9 12/13/2016   HCT 38.0 12/13/2016   PLT 160 11/30/2016   GLUCOSE 139 (H) 12/13/2016   ALT 17 11/30/2016   AST 21 11/30/2016   NA 142 12/13/2016   K 3.6 12/13/2016   CL 104 11/30/2016   CREATININE 0.95 11/30/2016   BUN 19 11/30/2016   CO2 30 11/30/2016   TSH 2.374 11/30/2016   INR 1.1 10/29/2015     Labs dated 05/19/16: Cholesterol 170, triglycerides 151, HDL 59, LDL 81. A1c 6.9%. TSH normal.  Dated 09/21/16: A1c 6.7%  Echo 03/02/16: Study Conclusions  - Left ventricle: The cavity size was normal. Systolic function was   normal. The estimated ejection fraction was in the range of 60%   to 65%. Wall motion was normal; there were no regional wall   motion abnormalities. The study is not technically sufficient to   allow evaluation of LV diastolic function. - Aortic valve: Transvalvular velocity was within the normal range.   There was no stenosis. There was no regurgitation. - Mitral valve: Calcified annulus. Transvalvular velocity was   within the normal range. There was no evidence for stenosis.   There was moderate regurgitation directed centrally. - Left atrium: The atrium was severely dilated. - Right ventricle: The cavity size was normal. Wall thickness was   normal. Systolic function was normal. - Right atrium: The atrium was severely dilated. - Tricuspid valve: There was moderate regurgitation. - Pulmonic valve: There was moderate regurgitation. - Pulmonary arteries: Systolic pressure was severely increased. PA   peak pressure: 71 mm Hg (S). - Pericardium, extracardiac: A small pericardial effusion was   identified. Features were not consistent with tamponade   physiology.  ASSESSMENT AND PLAN:  1. Atrial fibrillation paroxysmal. S/p DCCV x 2 previously with early  return of Afib. Recent recurrence of Atrial flutter versus atrial tachy on Sotalol. S/p DCCV. This is her first arrhythmia in 6 months since on Betapace.  On Eliquis for anticoagulation. Will continue current therapy. Follow up in 4 months.  2. Atypical chest pain. Nonobstructive CAD by cath in 2017.   3. OSA on CPAP  4. Pulmonary HTN- right heart cath suggests PAH. CT of chest negative.   5. HTN  6. Hyperlipidemia.   7. Dyspnea multifactorial. Recommend daily walking exercise to try and improve conditioning.    Current medicines are reviewed at length with the patient today.  The patient does not have concerns regarding medicines.  The following changes have been made:  no change  Labs/ tests ordered today include:  No orders of the defined types were placed in this encounter.  Follow up in 6 months.  Signed, Eulah Walkup Martinique, MD  12/20/2016 1:54 PM    [facility] [address], [place], [name], [address] Phone [phone], Fax [phone]

## 2016-12-20 ENCOUNTER — Ambulatory Visit (INDEPENDENT_AMBULATORY_CARE_PROVIDER_SITE_OTHER): Payer: Medicare HMO | Admitting: Cardiology

## 2016-12-20 ENCOUNTER — Encounter: Payer: Self-pay | Admitting: Cardiology

## 2016-12-20 VITALS — BP 148/71 | HR 61 | Ht 60.0 in | Wt 171.0 lb

## 2016-12-20 DIAGNOSIS — I480 Paroxysmal atrial fibrillation: Secondary | ICD-10-CM

## 2016-12-20 DIAGNOSIS — I2510 Atherosclerotic heart disease of native coronary artery without angina pectoris: Secondary | ICD-10-CM | POA: Diagnosis not present

## 2016-12-20 DIAGNOSIS — R0609 Other forms of dyspnea: Secondary | ICD-10-CM

## 2016-12-20 DIAGNOSIS — Z7901 Long term (current) use of anticoagulants: Secondary | ICD-10-CM

## 2016-12-20 DIAGNOSIS — R0600 Dyspnea, unspecified: Secondary | ICD-10-CM

## 2016-12-20 NOTE — Patient Instructions (Signed)
Continue your current therapy  We will follow up in 4 months  Work on regular exercise with walking

## 2016-12-29 DIAGNOSIS — H353211 Exudative age-related macular degeneration, right eye, with active choroidal neovascularization: Secondary | ICD-10-CM | POA: Diagnosis not present

## 2017-01-01 DIAGNOSIS — R0902 Hypoxemia: Secondary | ICD-10-CM | POA: Diagnosis not present

## 2017-01-01 DIAGNOSIS — G4733 Obstructive sleep apnea (adult) (pediatric): Secondary | ICD-10-CM | POA: Diagnosis not present

## 2017-01-01 DIAGNOSIS — I5043 Acute on chronic combined systolic (congestive) and diastolic (congestive) heart failure: Secondary | ICD-10-CM | POA: Diagnosis not present

## 2017-01-20 DIAGNOSIS — I480 Paroxysmal atrial fibrillation: Secondary | ICD-10-CM | POA: Diagnosis not present

## 2017-01-20 DIAGNOSIS — E119 Type 2 diabetes mellitus without complications: Secondary | ICD-10-CM | POA: Diagnosis not present

## 2017-01-20 DIAGNOSIS — I2510 Atherosclerotic heart disease of native coronary artery without angina pectoris: Secondary | ICD-10-CM | POA: Diagnosis not present

## 2017-01-20 DIAGNOSIS — Z6832 Body mass index (BMI) 32.0-32.9, adult: Secondary | ICD-10-CM | POA: Diagnosis not present

## 2017-01-20 DIAGNOSIS — M5489 Other dorsalgia: Secondary | ICD-10-CM | POA: Diagnosis not present

## 2017-01-20 DIAGNOSIS — I10 Essential (primary) hypertension: Secondary | ICD-10-CM | POA: Diagnosis not present

## 2017-02-01 ENCOUNTER — Ambulatory Visit (INDEPENDENT_AMBULATORY_CARE_PROVIDER_SITE_OTHER): Payer: Medicare HMO

## 2017-02-01 ENCOUNTER — Ambulatory Visit (INDEPENDENT_AMBULATORY_CARE_PROVIDER_SITE_OTHER): Payer: Medicare HMO | Admitting: Orthopaedic Surgery

## 2017-02-01 ENCOUNTER — Encounter (INDEPENDENT_AMBULATORY_CARE_PROVIDER_SITE_OTHER): Payer: Self-pay | Admitting: Orthopaedic Surgery

## 2017-02-01 DIAGNOSIS — R0902 Hypoxemia: Secondary | ICD-10-CM | POA: Diagnosis not present

## 2017-02-01 DIAGNOSIS — I5043 Acute on chronic combined systolic (congestive) and diastolic (congestive) heart failure: Secondary | ICD-10-CM | POA: Diagnosis not present

## 2017-02-01 DIAGNOSIS — M25551 Pain in right hip: Secondary | ICD-10-CM | POA: Diagnosis not present

## 2017-02-01 DIAGNOSIS — Z96641 Presence of right artificial hip joint: Secondary | ICD-10-CM | POA: Diagnosis not present

## 2017-02-01 DIAGNOSIS — G4733 Obstructive sleep apnea (adult) (pediatric): Secondary | ICD-10-CM | POA: Diagnosis not present

## 2017-02-01 MED ORDER — DICLOFENAC SODIUM 1 % TD GEL: 2.0000 g | Freq: Four times a day (QID) | TRANSDERMAL | 5 refills | Status: DC

## 2017-02-01 NOTE — Progress Notes (Signed)
Office Visit Note   Patient: Melinda [name]           Date of Birth: 04/15/1931           MRN: [id number] Visit Date: 02/01/2017              Requested by: Crist Infante, MD [address] [place],  [place] [address] PCP: Crist Infante, MD   Assessment & Plan: Visit Diagnoses:  1. Pain in right hip   2. Status post right hip replacement     Plan: Impression is right iliopsoas tendinitis and right knee IT band syndrome.  I recommend physical therapy and Voltaren gel for the IT band syndrome.  We will get her set up with an iliopsoas injection with Dr. Ernestina Patches to see if this will help.  Prescription for Voltaren.  Follow-up in 6 weeks for recheck.  Follow-Up Instructions: Return in about 6 weeks (around 03/15/2017).   Orders:  Orders Placed This Encounter  Procedures  . XR Pelvis 1-2 Views  . Ambulatory referral to Physical Medicine Rehab   Meds ordered this encounter  Medications  . diclofenac sodium (VOLTAREN) 1 % GEL    Sig: Apply 2 g topically 4 (four) times daily.    Dispense:  1 Tube    Refill:  5      Procedures: No procedures performed   Clinical Data: No additional findings.   Subjective: Chief Complaint  Patient presents with  . Right Hip - Pain    Patient is a 83 year old female who was 19 months status post right total hip replacement who comes in with right groin pain that is worse with getting in and out of her car and with hip flexion.  Denies any numbness or tingling or back pain.  She is also complaining of some lateral right knee pain.  Denies any injuries.    Review of Systems  Constitutional: Negative.   HENT: Negative.   Eyes: Negative.   Respiratory: Negative.   Cardiovascular: Negative.   Endocrine: Negative.   Musculoskeletal: Negative.   Neurological: Negative.   Hematological: Negative.   Psychiatric/Behavioral: Negative.   All other systems reviewed and are negative.    Objective: Vital Signs: There were no vitals taken  for this visit.  Physical Exam  Constitutional: She is oriented to person, place, and time. She appears well-developed and well-nourished.  Pulmonary/Chest: Effort normal.  Neurological: She is alert and oriented to person, place, and time.  Skin: Skin is warm. Capillary refill takes less than 2 seconds.  Psychiatric: She has a normal mood and affect. Her behavior is normal. Judgment and thought content normal.  Nursing note and vitals reviewed.   Ortho Exam Right hip exam shows painless rotation.  She has significant pain with straight leg raise test that localizes to the groin.  There is no lateral hip pain.  She does not walk with a limp.  Right knee exam shows no joint effusion.  She is tender along the IT band.  There is no obvious snapping of the IT band Specialty Comments:  No specialty comments available.  Imaging: Xr Pelvis 1-2 Views  Result Date: 02/01/2017 Stable right total hip replacement without complication.    PMFS History: Patient Active Problem List   Diagnosis Date Noted  . Pain in right hip 06/22/2016  . Encounter for monitoring sotalol therapy 05/31/2016  . Chest pain   . CHF exacerbation ([place]) 02/29/2016  . Acute respiratory failure with hypoxia ([place]) 02/29/2016  . Persistent atrial  fibrillation ([place])   . Hypertensive heart disease   . Hypercholesterolemia   . Pulmonary hypertension ([place])   . Dyspnea on exertion   . Non-obstructive CAD   . Chest pain, moderate coronary artery risk 07/14/2015  . Pain in the chest   . PAF (paroxysmal atrial fibrillation) (HCC)   . Pulmonary arterial hypertension ([place])   . Chest pain with moderate risk for cardiac etiology 07/08/2015  . Dyspnea 07/08/2015  . Essential hypertension 07/08/2015  . Hyperlipidemia 07/08/2015  . Osteoarthritis of right hip 06/26/2015  . Hip joint replacement status 06/26/2015  . OSA on CPAP 03/27/2015  . Ataxia 03/27/2015  . Falls 03/27/2015  . Insomnia, controlled 03/27/2015  .  Insomnia with sleep apnea 03/22/2014  . Adjustment disorder with mixed anxiety and depressed mood 03/22/2014  . Sleep apnea with use of continuous positive airway pressure (CPAP) 10/19/2012  . Multifactorial gait disorder    Past Medical History:  Diagnosis Date  . A-fib ([place])   . Acid reflux   . Anxiety   . Arthritis   . Cataract    surgery,bilateral  . Constipation   . Depression   . Diabetes mellitus without complication (HCC)    Pre diabetes  . Dyspnea on exertion    a. 07/2015 Echo: EF 55-60%, no rwma, mild MR, mod dil LA/RA/RV, mod TR, triv PR, PASP 88mmHg.  . Falls   . Family history of adverse reaction to anesthesia    mother always had n/v  . Hx: UTI (urinary tract infection)   . Hypercholesterolemia   . Hypertensive heart disease   . Insomnia   . Multifactorial gait disorder   . Non-obstructive CAD    a. 07/2015 Cath: LM 30-40d, LAD min irregs, LCX min irregs, RCA 30p, RA 17, RV 33/7, PA 42/19, PCWP 17.  . Osteoarthritis    a. 06/2015 s/p R total hip arthroplasty.  [name] PAF (paroxysmal atrial fibrillation) ([place])    a. Dx 07/2015, CHA2DS2VASc = 6-->Eliquis.  . Premature atrial contractions   . Pulmonary hypertension ([place])    a. 07/2015 Echo: PASP 40mmHg.  [name] Pyloric antral stenosis    in childhood,infancy  . Sleep apnea with use of continuous positive airway pressure (CPAP) 10/19/2012   a. compliant w/ CPAP.    Family History  Problem Relation Age of Onset  . Heart attack Father   . Heart failure Mother   . Cancer Mother        colon,kidney  . Diabetes Sister   . Hypertension Sister   . Heart failure Sister     Past Surgical History:  Procedure Laterality Date  . ABDOMINAL HYSTERECTOMY  1970  . ABDOMINAL SURGERY     as a 76 day old baby  . CARDIAC CATHETERIZATION N/A 07/14/2015   Procedure: Right/Left Heart Cath and Coronary Angiography;  Surgeon: Sanda Klein, MD;  Location: [facility];  Service: Cardiovascular;  Laterality: N/A;  . CARDIOVERSION N/A  11/04/2015   Procedure: CARDIOVERSION;  Surgeon: Lelon Perla, MD;  Location: [facility] ENDOSCOPY;  Service: Cardiovascular;  Laterality: N/A;  . CARDIOVERSION N/A 02/25/2016   Procedure: CARDIOVERSION;  Surgeon: Pixie Casino, MD;  Location: [facility] ENDOSCOPY;  Service: Cardiovascular;  Laterality: N/A;  . CARDIOVERSION N/A 12/13/2016   Procedure: CARDIOVERSION;  Surgeon: Fay Records, MD;  Location: [facility];  Service: Cardiovascular;  Laterality: N/A;  . COLONOSCOPY    . EYE SURGERY Bilateral    cataract surgery with lens implant  . INNER EAR SURGERY  Left    x 3  . JOINT REPLACEMENT Right   . LAPAROSCOPIC SALPINGO OOPHERECTOMY    . NM MYOVIEW LTD  11/30/2007   normal stress nuclear study/EF-83%  . TONSILLECTOMY    . TOTAL HIP ARTHROPLASTY Right 06/26/2015   Procedure: RIGHT TOTAL HIP ARTHROPLASTY ANTERIOR APPROACH;  Surgeon: Leandrew Koyanagi, MD;  Location: [facility];  Service: Orthopedics;  Laterality: Right;  . TRANSTHORACIC ECHOCARDIOGRAM  07/23/2009   mild concentric LVH/mild mitral insufficiency/ moderate tricuspid innsufficiency/mild pulmonary HTN/EF- 55-60%  . VAGINAL DELIVERY     Social History   Occupational History  . Occupation: Retired  Tobacco Use  . Smoking status: Never Smoker  . Smokeless tobacco: Never Used  Substance and Sexual Activity  . Alcohol use: No    Alcohol/week: 0.0 oz    Comment: rare  . Drug use: No  . Sexual activity: Not on file

## 2017-02-02 DIAGNOSIS — H353211 Exudative age-related macular degeneration, right eye, with active choroidal neovascularization: Secondary | ICD-10-CM | POA: Diagnosis not present

## 2017-02-09 ENCOUNTER — Ambulatory Visit: Payer: Medicare HMO | Admitting: Rehabilitative and Restorative Service Providers"

## 2017-02-09 ENCOUNTER — Encounter: Payer: Self-pay | Admitting: Rehabilitative and Restorative Service Providers"

## 2017-02-09 DIAGNOSIS — M25561 Pain in right knee: Secondary | ICD-10-CM | POA: Diagnosis not present

## 2017-02-09 DIAGNOSIS — M25551 Pain in right hip: Secondary | ICD-10-CM

## 2017-02-09 DIAGNOSIS — R29898 Other symptoms and signs involving the musculoskeletal system: Secondary | ICD-10-CM | POA: Diagnosis not present

## 2017-02-09 NOTE — Patient Instructions (Signed)
Self massage to the front of the the right hip using about 3 inch plastic ball   Outer Hip Stretch: Reclined IT Band Stretch (Strap)   Strap around one foot, pull leg across body until you feel a pull or stretch in the outside of your hip, with shoulders on mat. Hold for 30 seconds. Repeat 3 times each leg. 2-3 times/day.    TENS UNIT: This is helpful for muscle pain and spasm.   Search and Purchase a TENS 7000 2nd edition at www.tenspros.com. It should be less than $30.     TENS unit instructions: Do not shower or bathe with the unit on Turn the unit off before removing electrodes or batteries If the electrodes lose stickiness add a drop of water to the electrodes after they are disconnected from the unit and place on plastic sheet. If you continued to have difficulty, call the TENS unit company to purchase more electrodes. Do not apply lotion on the skin area prior to use. Make sure the skin is clean and dry as this will help prolong the life of the electrodes. After use, always check skin for unusual red areas, rash or other skin difficulties. If there are any skin problems, does not apply electrodes to the same area. Never remove the electrodes from the unit by pulling the wires. Do not use the TENS unit or electrodes other than as directed. Do not change electrode placement without consultating your therapist or physician. Keep 2 fingers with between each electrode.   Trigger Point Dry Needling  . What is Trigger Point Dry Needling (DN)? o DN is a physical therapy technique used to treat muscle pain and dysfunction. Specifically, DN helps deactivate muscle trigger points (muscle knots).  o A thin filiform needle is used to penetrate the skin and stimulate the underlying trigger point. The goal is for a local twitch response (LTR) to occur and for the trigger point to relax. No medication of any kind is injected during the procedure.   . What Does Trigger Point Dry Needling  Feel Like?  o The procedure feels different for each individual patient. Some patients report that they do not actually feel the needle enter the skin and overall the process is not painful. Very mild bleeding may occur. However, many patients feel a deep cramping in the muscle in which the needle was inserted. This is the local twitch response.   [name] How Will I feel after the treatment? o Soreness is normal, and the onset of soreness may not occur for a few hours. Typically this soreness does not last longer than two days.  o Bruising is uncommon, however; ice can be used to decrease any possible bruising.  o In rare cases feeling tired or nauseous after the treatment is normal. In addition, your symptoms may get worse before they get better, this period will typically not last longer than 24 hours.   . What Can I do After My Treatment? o Increase your hydration by drinking more water for the next 24 hours. o You may place ice or heat on the areas treated that have become sore, however, do not use heat on inflamed or bruised areas. Heat often brings more relief post needling. o You can continue your regular activities, but vigorous activity is not recommended initially after the treatment for 24 hours. o DN is best combined with other physical therapy such as strengthening, stretching, and other therapies.

## 2017-02-09 NOTE — Therapy (Addendum)
[facility] [place], [name], [address] Phone: [phone]   Fax:  [phone]  Physical Therapy Evaluation  Patient Details  Name: Melinda [name] MRN: [id number] Date of Birth: 03-31-1931 Referring Provider: Dr Frankey Shown    Encounter Date: 02/09/2017  PT End of Session - 02/09/17 1621    Visit Number  1    Number of Visits  12    Date for PT Re-Evaluation  03/13/17    PT Start Time  1518    PT Stop Time  1623    PT Time Calculation (min)  65 min    Activity Tolerance  Patient tolerated treatment well       Past Medical History:  Diagnosis Date  . A-fib ([place])   . Acid reflux   . Anxiety   . Arthritis   . Cataract    surgery,bilateral  . Constipation   . Depression   . Diabetes mellitus without complication (HCC)    Pre diabetes  . Dyspnea on exertion    a. 07/2015 Echo: EF 55-60%, no rwma, mild MR, mod dil LA/RA/RV, mod TR, triv PR, PASP 19mmHg.  . Falls   . Family history of adverse reaction to anesthesia    mother always had n/v  . Hx: UTI (urinary tract infection)   . Hypercholesterolemia   . Hypertensive heart disease   . Insomnia   . Multifactorial gait disorder   . Non-obstructive CAD    a. 07/2015 Cath: LM 30-40d, LAD min irregs, LCX min irregs, RCA 30p, RA 17, RV 33/7, PA 42/19, PCWP 17.  . Osteoarthritis    a. 06/2015 s/p R total hip arthroplasty.  [name] PAF (paroxysmal atrial fibrillation) ([place])    a. Dx 07/2015, CHA2DS2VASc = 6-->Eliquis.  . Premature atrial contractions   . Pulmonary hypertension ([place])    a. 07/2015 Echo: PASP 19mmHg.  [name] Pyloric antral stenosis    in childhood,infancy  . Sleep apnea with use of continuous positive airway pressure (CPAP) 10/19/2012   a. compliant w/ CPAP.    Past Surgical History:  Procedure Laterality Date  . ABDOMINAL HYSTERECTOMY  1970  . ABDOMINAL SURGERY     as a 13 day old baby  . CARDIAC CATHETERIZATION N/A 07/14/2015   Procedure:  Right/Left Heart Cath and Coronary Angiography;  Surgeon: Sanda Klein, MD;  Location: [facility];  Service: Cardiovascular;  Laterality: N/A;  . CARDIOVERSION N/A 11/04/2015   Procedure: CARDIOVERSION;  Surgeon: Lelon Perla, MD;  Location: [facility] ENDOSCOPY;  Service: Cardiovascular;  Laterality: N/A;  . CARDIOVERSION N/A 02/25/2016   Procedure: CARDIOVERSION;  Surgeon: Pixie Casino, MD;  Location: [facility] ENDOSCOPY;  Service: Cardiovascular;  Laterality: N/A;  . CARDIOVERSION N/A 12/13/2016   Procedure: CARDIOVERSION;  Surgeon: Fay Records, MD;  Location: [facility];  Service: Cardiovascular;  Laterality: N/A;  . COLONOSCOPY    . EYE SURGERY Bilateral    cataract surgery with lens implant  . INNER EAR SURGERY Left    x 3  . JOINT REPLACEMENT Right   . LAPAROSCOPIC SALPINGO OOPHERECTOMY    . NM MYOVIEW LTD  11/30/2007   normal stress nuclear study/EF-83%  . TONSILLECTOMY    . TOTAL HIP ARTHROPLASTY Right 06/26/2015   Procedure: RIGHT TOTAL HIP ARTHROPLASTY ANTERIOR APPROACH;  Surgeon: Leandrew Koyanagi, MD;  Location: [facility];  Service: Orthopedics;  Laterality: Right;  . TRANSTHORACIC ECHOCARDIOGRAM  07/23/2009   mild concentric LVH/mild mitral insufficiency/ moderate tricuspid innsufficiency/mild  pulmonary HTN/EF- 55-60%  . VAGINAL DELIVERY      There were no vitals filed for this visit.   Subjective Assessment - 02/09/17 1529    Subjective  Rt hip and knee have been hurting for 2-3 months. She thinks that she stepped up on the lawn mower with the Rt leg. she has had continued pain since that time. She has difficulty getting up in the morning. She has difficulty with putting weight on her feet and uses the walker in the morning. The ointment the doctor gave her has helped.     Pertinent History  Rt THA 4/17; angina; abnormal heart rhythm required cardioversion several times     How long can you sit comfortably?  2 hours     How long can you stand comfortably?  15 min     How long  can you walk comfortably?  15 min     Diagnostic tests  xrays     Patient Stated Goals  get rid of the hip and knee pain     Currently in Pain?  Yes    Pain Score  0-No pain    Pain Location  Knee    Pain Orientation  Right;Lateral    Pain Descriptors / Indicators  Aching;Sharp    Pain Type  Acute pain    Pain Radiating Towards  also hurts in Rt groin     Pain Onset  More than a month ago    Pain Frequency  Intermittent    Aggravating Factors   standing in the morning after awakening     Pain Relieving Factors  topical analgic          [facility] PT Assessment - 02/09/17 0001      Assessment   Medical Diagnosis  Rt IT band symdrome; Rt psoas tendinitis     Referring Provider  Dr Frankey Shown     Onset Date/Surgical Date  12/06/16    Hand Dominance  Right    Next MD Visit  06/23/17    Prior Therapy  yes after Rt THA       Precautions   Precautions  None      Balance Screen   Has the patient fallen in the past 6 months  Yes    How many times?  1    Has the patient had a decrease in activity level because of a fear of falling?   No    Is the patient reluctant to leave their home because of a fear of falling?   No      Home Environment   Additional Comments  multilevel home - 2 steps to enter home no problems       Prior Function   Level of Independence  Independent    Vocation  Retired    [employer] in a back 23 yrs - retired at 83 yr old     Leisure  household chores; yard work; Retail banker       Observation/Other Assessments   Focus on Therapeutic Outcomes (FOTO)   56% limitation       Sensation   Additional Comments  WFL's per pt report       Posture/Postural Control   Posture Comments  head forwad shoudlers rounded and elevated; flexed forward at hips       Strength   Right/Left Hip  -- assessed with pt in supine Lt hip WFL's     Right Hip Flexion  2+/5  Right Hip Extension  3+/5    Right Hip ABduction  3+/5    Right/Left Knee  -- 5/5 bilat        Flexibility   Hamstrings  tight bilat Rt 65 deg; Lt 70 deg     ITB  tight Rt     Piriformis  tight Rt       Palpation   Palpation comment  significant tightness Rt psoas and hip flexors; Rt lateral thigh at knee and proximal fibula       Ambulation/Gait   Gait Comments  wide based gait with limp Rt LE in wt bearing on Rt; forward flexed at trunk and hips              Objective measurements completed on examination: See above findings.      [facility] Adult PT Treatment/Exercise - 02/09/17 0001      Therapeutic Activites    Therapeutic Activities  -- myofacial ball release work anterior Rt hip       Knee/Hip Exercises: Stretches   Hip Flexor Stretch  Right;3 reps;30 seconds sitting     ITB Stretch  Right;3 reps;30 seconds supine with strap       Moist Heat Therapy   Number Minutes Moist Heat  20 Minutes    Moist Heat Location  Hip      Electrical Stimulation   Electrical Stimulation Location  anterior Rt hip     Electrical Stimulation Action  IFC    Electrical Stimulation Parameters  to tolerance    Electrical Stimulation Goals  Pain;Tone      Iontophoresis   Type of Iontophoresis  Dexamethasone    Location  lateral distal IT band; fibular head both on Rt     Dose  120 mAmp    Time  12 hours              PT Education - 02/09/17 1621    Education provided  Yes    Education Details  HEP TENS DN     Person(s) Educated  Patient    Methods  Explanation;Demonstration;Tactile cues;Verbal cues;Handout    Comprehension  Verbalized understanding;Returned demonstration;Verbal cues required;Tactile cues required          PT Long Term Goals - 02/09/17 1629      PT LONG TERM GOAL #1   Title  Improve tightness to palpation through the Rt hip flexors and IT band with patient reporting no to little pain with palpation 03/23/17    Time  6    Period  Weeks    Status  New      PT LONG TERM GOAL #2   Title  increase Rt hip strength to 4+/4 to 5-/5 allowing patient  to get in and out of bed with greater ease and less pain 03/23/17    Time  6    Period  Weeks    Status  New      PT LONG TERM GOAL #3   Title  decrease pain Rt hip and Rt knee by 50-75% allowing patient to preform functional activities with less pain and greater ease and safety 03/23/17    Time  6    Period  Weeks    Status  New      PT LONG TERM GOAL #4   Title  Independent in HEP 03/23/17    Time  6    Period  Weeks    Status  New      PT LONG TERM  GOAL #5   Title  Improve FOTO to </= 49% limtation /16/19    Time  6    Period  Weeks    Status  New             Plan - 03/08/17 1622    Clinical Impression Statement  Melinda [name] presents with Rt anterior hip and lateral knee pain which has been present for the past 2-3 months. She feels that she irritated the hip when lifting Rt LE up to get on the mower. She has poor posture and alignment; limited Rt LE mobilty; tightness through anterior hip in Rt hip flexors and adductors as well as lateral thigh and leg in ITband. She has weakness in Rt hip and pain with functional activities. Melinda [name] has a history of LBP radiating into bilat hips.  She will benefit from PT to address problems identified.     History and Personal Factors relevant to plan of care:  Rt THA 4/17     Clinical Presentation  Evolving    Clinical Decision Making  Low    Rehab Potential  Good    PT Frequency  2x / week    PT Duration  6 weeks    PT Treatment/Interventions  Patient/family education;ADLs/Self Care Home Management;Cryotherapy;Electrical Stimulation;Iontophoresis 4mg /ml Dexamethasone;Moist Heat;Ultrasound;Dry needling;Manual techniques;Therapeutic activities;Therapeutic exercise    PT Next Visit Plan  review HEP; manual therapy vs DN to anterior hip and lateral thigh; appropriate exercise(trial of hip ext in standing to activiate hip extensors) and modalities as indicated        Patient will benefit from skilled therapeutic intervention in order to improve  the following deficits and impairments:  Postural dysfunction, Improper body mechanics, Increased fascial restricitons, Increased muscle spasms, Decreased strength, Decreased mobility, Decreased range of motion, Decreased activity tolerance, Abnormal gait  Visit Diagnosis: Pain in right hip - Plan: PT plan of care cert/re-cert  Acute pain of right knee - Plan: PT plan of care cert/re-cert  Other symptoms and signs involving the musculoskeletal system - Plan: PT plan of care cert/re-cert  [facility] PT PB G-CODES - 03/08/2017 1644    Functional Assessment Tool Used   Evaluation; FOTO; clinical assessment     Functional Limitations  Changing and maintaining body position    Changing and Maintaining Body Position Current Status (F7510)  At least 60 percent but less than 80 percent impaired, limited or restricted    Changing and Maintaining Body Position Goal Status (C5852)  At least 40 percent but less than 60 percent impaired, limited or restricted        Problem List Patient Active Problem List   Diagnosis Date Noted  . Pain in right hip 06/22/2016  . Encounter for monitoring sotalol therapy 05/31/2016  . Chest pain   . CHF exacerbation ([place]) 02/29/2016  . Acute respiratory failure with hypoxia ([place]) 02/29/2016  . Persistent atrial fibrillation ([place])   . Hypertensive heart disease   . Hypercholesterolemia   . Pulmonary hypertension ([place])   . Dyspnea on exertion   . Non-obstructive CAD   . Chest pain, moderate coronary artery risk 07/14/2015  . Pain in the chest   . PAF (paroxysmal atrial fibrillation) (HCC)   . Pulmonary arterial hypertension ([place])   . Chest pain with moderate risk for cardiac etiology 07/08/2015  . Dyspnea 07/08/2015  . Essential hypertension 07/08/2015  . Hyperlipidemia 07/08/2015  . Osteoarthritis of right hip 06/26/2015  . Hip joint replacement status 06/26/2015  . OSA on CPAP 03/27/2015  .  Ataxia 03/27/2015  . Falls 03/27/2015  . Insomnia, controlled  03/27/2015  . Insomnia with sleep apnea 03/22/2014  . Adjustment disorder with mixed anxiety and depressed mood 03/22/2014  . Sleep apnea with use of continuous positive airway pressure (CPAP) 10/19/2012  . Multifactorial gait disorder     Kaseem Vastine Nilda Simmer PT, MPH  02/09/2017, 4:58 PM  [facility] [place], [name], [address] Phone: [phone]   Fax:  [phone]  Name: NICKI FURLAN MRN: [id number] Date of Birth: Sep 02, 1931

## 2017-02-11 ENCOUNTER — Ambulatory Visit: Payer: Medicare HMO | Admitting: Rehabilitative and Restorative Service Providers"

## 2017-02-11 DIAGNOSIS — M25561 Pain in right knee: Secondary | ICD-10-CM

## 2017-02-11 DIAGNOSIS — R29898 Other symptoms and signs involving the musculoskeletal system: Secondary | ICD-10-CM

## 2017-02-11 DIAGNOSIS — M25551 Pain in right hip: Secondary | ICD-10-CM

## 2017-02-11 NOTE — Patient Instructions (Addendum)
Strengthening: Hip Abduction - Resisted    Standing tall, lead out with heel, extend leg out from side. Repeat _10___ times per set. Do __2-3__ sets per session. Do __1-2__ sessions per day.     Strengthening: Hip Extension - Resisted    Standing tall and straight, pull leg straight back. Repeat __10__ times per set. Do __2-3__ sets per session. Do _1-2___ sessions per day.    Eversion: Resisted    With right foot in tubing loop, hold tubing around other foot to resist and turn foot out. Repeat _10___ times per set. Do __2-3__ sets per session. Do __1-2__ sessions per day.    Dorsiflexion: Resisted    Facing anchor, tubing around left foot, pull toward face.  Repeat __10__ times per set. Do __2-3__ sets per session. Do __1-2__ sessions per day.

## 2017-02-11 NOTE — Therapy (Signed)
[place], [name], [address] Phone: [phone]   Fax:  [phone]  Physical Therapy Treatment  Patient Details  Name: Melinda [name] MRN: [id number] Date of Birth: 05/12/1931 Referring Provider: Dr Frankey Shown    Encounter Date: 02/11/2017  PT End of Session - 02/11/17 1432    Visit Number  2    Number of Visits  12    Date for PT Re-Evaluation  03/13/17    PT Start Time  5462    PT Stop Time  1529    PT Time Calculation (min)  57 min    Activity Tolerance  Patient tolerated treatment well       Past Medical History:  Diagnosis Date  . A-fib ([place])   . Acid reflux   . Anxiety   . Arthritis   . Cataract    surgery,bilateral  . Constipation   . Depression   . Diabetes mellitus without complication (HCC)    Pre diabetes  . Dyspnea on exertion    a. 07/2015 Echo: EF 55-60%, no rwma, mild MR, mod dil LA/RA/RV, mod TR, triv PR, PASP 39mmHg.  . Falls   . Family history of adverse reaction to anesthesia    mother always had n/v  . Hx: UTI (urinary tract infection)   . Hypercholesterolemia   . Hypertensive heart disease   . Insomnia   . Multifactorial gait disorder   . Non-obstructive CAD    a. 07/2015 Cath: LM 30-40d, LAD min irregs, LCX min irregs, RCA 30p, RA 17, RV 33/7, PA 42/19, PCWP 17.  . Osteoarthritis    a. 06/2015 s/p R total hip arthroplasty.  [name] PAF (paroxysmal atrial fibrillation) ([place])    a. Dx 07/2015, CHA2DS2VASc = 6-->Eliquis.  . Premature atrial contractions   . Pulmonary hypertension ([place])    a. 07/2015 Echo: PASP 8mmHg.  [name] Pyloric antral stenosis    in childhood,infancy  . Sleep apnea with use of continuous positive airway pressure (CPAP) 10/19/2012   a. compliant w/ CPAP.    Past Surgical History:  Procedure Laterality Date  . ABDOMINAL HYSTERECTOMY  1970  . ABDOMINAL SURGERY     as a 22 day old baby  . CARDIAC CATHETERIZATION N/A 07/14/2015   Procedure:  Right/Left Heart Cath and Coronary Angiography;  Surgeon: Sanda Klein, MD;  Location: [place] [facility];  Service: Cardiovascular;  Laterality: N/A;  . CARDIOVERSION N/A 11/04/2015   Procedure: CARDIOVERSION;  Surgeon: Lelon Perla, MD;  Location: [facility] ENDOSCOPY;  Service: Cardiovascular;  Laterality: N/A;  . CARDIOVERSION N/A 02/25/2016   Procedure: CARDIOVERSION;  Surgeon: Pixie Casino, MD;  Location: [facility] ENDOSCOPY;  Service: Cardiovascular;  Laterality: N/A;  . CARDIOVERSION N/A 12/13/2016   Procedure: CARDIOVERSION;  Surgeon: Fay Records, MD;  Location: [place];  Service: Cardiovascular;  Laterality: N/A;  . COLONOSCOPY    . EYE SURGERY Bilateral    cataract surgery with lens implant  . INNER EAR SURGERY Left    x 3  . JOINT REPLACEMENT Right   . LAPAROSCOPIC SALPINGO OOPHERECTOMY    . NM MYOVIEW LTD  11/30/2007   normal stress nuclear study/EF-83%  . TONSILLECTOMY    . TOTAL HIP ARTHROPLASTY Right 06/26/2015   Procedure: RIGHT TOTAL HIP ARTHROPLASTY ANTERIOR APPROACH;  Surgeon: Leandrew Koyanagi, MD;  Location: [place];  Service: Orthopedics;  Laterality: Right;  . TRANSTHORACIC ECHOCARDIOGRAM  07/23/2009   mild concentric LVH/mild mitral insufficiency/ moderate tricuspid innsufficiency/mild  pulmonary HTN/EF- 55-60%  . VAGINAL DELIVERY      There were no vitals filed for this visit.  Subjective Assessment - 02/11/17 1435    Subjective  Melinda [name] reports that she can tell a difference in the way the Rt leg feels. It is not hurting as much as it was. She feels the patch helps. She has been doing the exercises at home. She has pain with lifting the Rt leg up and with driving - moving the Rt leg on and off the gas.     Currently in Pain?  No/denies                      [facility] Adult PT Treatment/Exercise - 02/11/17 0001      Knee/Hip Exercises: Stretches   Hip Flexor Stretch  Right;3 reps;30 seconds sitting with PT assist     ITB Stretch  Right;3 reps;30 seconds  supine with strap       Knee/Hip Exercises: Standing   Hip Abduction  AROM;Right;Left;2 sets;10 reps    Abduction Limitations  verbal and tactile cues for form and balance    Hip Extension  AROM;Right;Left;2 sets;10 reps    Extension Limitations  some difficulty with balance       Knee/Hip Exercises: Supine   Other Supine Knee/Hip Exercises  hooklying LE's supported for ankle DF/ ankle eversion bilat red TB 20 reps each PT assist for DF       Moist Heat Therapy   Number Minutes Moist Heat  20 Minutes    Moist Heat Location  Hip;Knee Rt anterior hip to lateral hip and lateral knee       Electrical Stimulation   Electrical Stimulation Location  anterior/lateral Rt hip     Electrical Stimulation Action  IFC    Electrical Stimulation Parameters  to tolerance    Electrical Stimulation Goals  Pain;Tone      Iontophoresis   Type of Iontophoresis  Dexamethasone    Location  lateral distal IT band; fibular head both on Rt     Dose  120 mAmp    Time  12 hours       Manual Therapy   Manual therapy comments  pt supine with LE's supported on bolster     Joint Mobilization  Greater trochanter AP mobs    Soft tissue mobilization  anterior Rt hip to lateral rt hip and thigh to knee              PT Education - 02/11/17 1524    Education provided  Yes    Education Details  HEP     Person(s) Educated  Patient    Methods  Explanation;Demonstration;Tactile cues;Verbal cues;Handout    Comprehension  Verbalized understanding;Returned demonstration;Verbal cues required;Tactile cues required          PT Long Term Goals - 02/09/17 1629      PT LONG TERM GOAL #1   Title  Improve tightness to palpation through the Rt hip flexors and IT band with patient reporting no to little pain with palpation 03/23/17    Time  6    Period  Weeks    Status  New      PT LONG TERM GOAL #2   Title  increase Rt hip strength to 4+/4 to 5-/5 allowing patient to get in and out of bed with greater ease and  less pain 03/23/17    Time  6    Period  Weeks  Status  New      PT LONG TERM GOAL #3   Title  decrease pain Rt hip and Rt knee by 50-75% allowing patient to preform functional activities with less pain and greater ease and safety 03/23/17    Time  6    Period  Weeks    Status  New      PT LONG TERM GOAL #4   Title  Independent in HEP 03/23/17    Time  6    Period  Weeks    Status  New      PT LONG TERM GOAL #5   Title  Improve FOTO to </= 49% limtation /16/19    Time  6    Period  Weeks    Status  New            Plan - 02/11/17 1439    Clinical Impression Statement  Good response to initial treatment, ionto and HEP. She has less pain. She tolerated manual work through the anterior and lateral hip.     Rehab Potential  Good    PT Frequency  2x / week    PT Duration  6 weeks    PT Treatment/Interventions  Patient/family education;ADLs/Self Care Home Management;Cryotherapy;Electrical Stimulation;Iontophoresis 4mg /ml Dexamethasone;Moist Heat;Ultrasound;Dry needling;Manual techniques;Therapeutic activities;Therapeutic exercise    PT Next Visit Plan  review HEP; manual therapy vs DN to anterior hip and lateral thigh; appropriate exercise(trial of hip ext in standing to activiate hip extensors) and modalities as indicated     Consulted and Agree with Plan of Care  Patient       Patient will benefit from skilled therapeutic intervention in order to improve the following deficits and impairments:  Postural dysfunction, Improper body mechanics, Increased fascial restricitons, Increased muscle spasms, Decreased strength, Decreased mobility, Decreased range of motion, Decreased activity tolerance, Abnormal gait  Visit Diagnosis: Pain in right hip  Acute pain of right knee  Other symptoms and signs involving the musculoskeletal system     Problem List Patient Active Problem List   Diagnosis Date Noted  . Pain in right hip 06/22/2016  . Encounter for monitoring sotalol  therapy 05/31/2016  . Chest pain   . CHF exacerbation ([place]) 02/29/2016  . Acute respiratory failure with hypoxia ([place]) 02/29/2016  . Persistent atrial fibrillation ([place])   . Hypertensive heart disease   . Hypercholesterolemia   . Pulmonary hypertension ([place])   . Dyspnea on exertion   . Non-obstructive CAD   . Chest pain, moderate coronary artery risk 07/14/2015  . Pain in the chest   . PAF (paroxysmal atrial fibrillation) (HCC)   . Pulmonary arterial hypertension ([place])   . Chest pain with moderate risk for cardiac etiology 07/08/2015  . Dyspnea 07/08/2015  . Essential hypertension 07/08/2015  . Hyperlipidemia 07/08/2015  . Osteoarthritis of right hip 06/26/2015  . Hip joint replacement status 06/26/2015  . OSA on CPAP 03/27/2015  . Ataxia 03/27/2015  . Falls 03/27/2015  . Insomnia, controlled 03/27/2015  . Insomnia with sleep apnea 03/22/2014  . Adjustment disorder with mixed anxiety and depressed mood 03/22/2014  . Sleep apnea with use of continuous positive airway pressure (CPAP) 10/19/2012  . Multifactorial gait disorder     Melinda [name] PT, MPH  02/11/2017, 3:25 PM  [facility] [place], [name], [address] Phone: [phone]   Fax:  [phone]  Name: Melinda [name] MRN: [id number] Date of Birth: [birth year]

## 2017-02-12 ENCOUNTER — Other Ambulatory Visit: Payer: Self-pay | Admitting: Nurse Practitioner

## 2017-02-15 ENCOUNTER — Encounter: Payer: Self-pay | Admitting: Physical Therapy

## 2017-02-15 ENCOUNTER — Ambulatory Visit: Payer: Medicare HMO | Admitting: Physical Therapy

## 2017-02-15 DIAGNOSIS — M25561 Pain in right knee: Secondary | ICD-10-CM | POA: Diagnosis not present

## 2017-02-15 DIAGNOSIS — M25551 Pain in right hip: Secondary | ICD-10-CM

## 2017-02-15 DIAGNOSIS — R29898 Other symptoms and signs involving the musculoskeletal system: Secondary | ICD-10-CM | POA: Diagnosis not present

## 2017-02-15 NOTE — Therapy (Signed)
[address] [place], [name], [address] Phone: [phone]   Fax:  [phone]  Physical Therapy Treatment  Patient Details  Name: Melinda [name] MRN: [id number] Date of Birth: [birth year] Referring Provider: Dr. Frankey Shown   Encounter Date: 02/15/2017  PT End of Session - 02/15/17 1153    Visit Number  3    Number of Visits  12    Date for PT Re-Evaluation  03/13/17    PT Start Time  1150    PT Stop Time  1248    PT Time Calculation (min)  58 min    Activity Tolerance  Patient tolerated treatment well    Behavior During Therapy  [facility] for tasks assessed/performed       Past Medical History:  Diagnosis Date  . A-fib ([place])   . Acid reflux   . Anxiety   . Arthritis   . Cataract    surgery,bilateral  . Constipation   . Depression   . Diabetes mellitus without complication (HCC)    Pre diabetes  . Dyspnea on exertion    a. 07/2015 Echo: EF 55-60%, no rwma, mild MR, mod dil LA/RA/RV, mod TR, triv PR, PASP 76mmHg.  . Falls   . Family history of adverse reaction to anesthesia    mother always had n/v  . Hx: UTI (urinary tract infection)   . Hypercholesterolemia   . Hypertensive heart disease   . Insomnia   . Multifactorial gait disorder   . Non-obstructive CAD    a. 07/2015 Cath: LM 30-40d, LAD min irregs, LCX min irregs, RCA 30p, RA 17, RV 33/7, PA 42/19, PCWP 17.  . Osteoarthritis    a. 06/2015 s/p R total hip arthroplasty.  [name] PAF (paroxysmal atrial fibrillation) ([place])    a. Dx 07/2015, CHA2DS2VASc = 6-->Eliquis.  . Premature atrial contractions   . Pulmonary hypertension ([place])    a. 07/2015 Echo: PASP 66mmHg.  [name] Pyloric antral stenosis    in childhood,infancy  . Sleep apnea with use of continuous positive airway pressure (CPAP) 10/19/2012   a. compliant w/ CPAP.    Past Surgical History:  Procedure Laterality Date  . ABDOMINAL HYSTERECTOMY  1970  . ABDOMINAL SURGERY     as a 76 day old  baby  . CARDIAC CATHETERIZATION N/A 07/14/2015   Procedure: Right/Left Heart Cath and Coronary Angiography;  Surgeon: Sanda Klein, MD;  Location: [place] [facility];  Service: Cardiovascular;  Laterality: N/A;  . CARDIOVERSION N/A 11/04/2015   Procedure: CARDIOVERSION;  Surgeon: Lelon Perla, MD;  Location: [facility] ENDOSCOPY;  Service: Cardiovascular;  Laterality: N/A;  . CARDIOVERSION N/A 02/25/2016   Procedure: CARDIOVERSION;  Surgeon: Pixie Casino, MD;  Location: [facility] ENDOSCOPY;  Service: Cardiovascular;  Laterality: N/A;  . CARDIOVERSION N/A 12/13/2016   Procedure: CARDIOVERSION;  Surgeon: Fay Records, MD;  Location: [place];  Service: Cardiovascular;  Laterality: N/A;  . COLONOSCOPY    . EYE SURGERY Bilateral    cataract surgery with lens implant  . INNER EAR SURGERY Left    x 3  . JOINT REPLACEMENT Right   . LAPAROSCOPIC SALPINGO OOPHERECTOMY    . NM MYOVIEW LTD  11/30/2007   normal stress nuclear study/EF-83%  . TONSILLECTOMY    . TOTAL HIP ARTHROPLASTY Right 06/26/2015   Procedure: RIGHT TOTAL HIP ARTHROPLASTY ANTERIOR APPROACH;  Surgeon: Leandrew Koyanagi, MD;  Location: [place];  Service: Orthopedics;  Laterality: Right;  . TRANSTHORACIC ECHOCARDIOGRAM  07/23/2009  mild concentric LVH/mild mitral insufficiency/ moderate tricuspid innsufficiency/mild pulmonary HTN/EF- 55-60%  . VAGINAL DELIVERY      There were no vitals filed for this visit.  Subjective Assessment - 02/15/17 1154    Subjective  Pt reports she continues to have difficulty getting Rt let in/out of car and moving Rt foot for gas/brake pedal, although it is getting easier.  "I'm able to pick up my leg a little bit.  You should see me try and get my socks on".     Patient Stated Goals  get rid of the hip and knee pain     Currently in Pain?  Yes    Pain Score  2     Pain Location  Hip    Pain Orientation  Anterior;Right    Pain Descriptors / Indicators  Sore    Aggravating Factors   moving leg a certain way     Pain Relieving Factors  heat, topical analgesic         [facility] PT Assessment - 02/15/17 0001      Assessment   Medical Diagnosis  Rt IT band symdrome; Rt psoas tendinitis     Referring Provider  Dr. Frankey Shown    Onset Date/Surgical Date  12/06/16    Hand Dominance  Right    Next MD Visit  06/23/17         [facility] Adult PT Treatment/Exercise - 02/15/17 0001      Knee/Hip Exercises: Stretches   Hip Flexor Stretch  Right;30 seconds;4 reps    ITB Stretch  Right;30 seconds;2 reps supine with strap     Other Knee/Hip Stretches  gentle fig 4 stretch RLE      Knee/Hip Exercises: Aerobic   Nustep  L4: 5 min       Knee/Hip Exercises: Standing   Hip Abduction  AROM;Right;Left;1 set;10 reps;Knee straight    Hip Extension  AROM;Right;Left;1 set;10 reps;Knee straight    Extension Limitations  with UE support; VC for posture and form.       Knee/Hip Exercises: Supine   Bridges  Both;1 set;10 reps    Other Supine Knee/Hip Exercises  resisted Rt hip flexion x 3 sec x 8 reps; followed by 1 more hip flexor stretch       Moist Heat Therapy   Number Minutes Moist Heat  20 Minutes    Moist Heat Location  Hip;Knee Rt anterior hip to lateral hip and lateral knee       Electrical Stimulation   Electrical Stimulation Location  anterior/lateral Rt hip     Electrical Stimulation Action  IFC    Electrical Stimulation Parameters  to tolerance     Electrical Stimulation Goals  Pain;Tone      Iontophoresis   Type of Iontophoresis  Dexamethasone    Location  lateral distal IT band; fibular head both on Rt     Dose  120 mAmp    Time  12 hours       Manual Therapy   Manual therapy comments  pt supine with LE's supported on bolster     Soft tissue mobilization  anterior Rt hip to lateral Rt hip and thigh to knee       Ankle Exercises: Seated   Other Seated Ankle Exercises  Rt ankle eversion with green band x 15; Rt ankle DF with green band x 10 reps                   PT Long Term  Goals - 02/15/17 1239      PT LONG TERM GOAL #1   Title  Improve tightness to palpation through the Rt hip flexors and IT band with patient reporting no to little pain with palpation 03/23/17    Time  6    Period  Weeks    Status  On-going      PT LONG TERM GOAL #2   Title  increase Rt hip strength to 4+/4 to 5-/5 allowing patient to get in and out of bed with greater ease and less pain 03/23/17    Time  6    Period  Weeks    Status  On-going      PT LONG TERM GOAL #3   Title  decrease pain Rt hip and Rt knee by 50-75% allowing patient to preform functional activities with less pain and greater ease and safety 03/23/17    Time  6    Period  Weeks    Status  On-going      PT LONG TERM GOAL #4   Title  Independent in HEP 03/23/17    Time  6    Period  Weeks    Status  On-going      PT LONG TERM GOAL #5   Title  Improve FOTO to </= 49% limtation /16/19    Time  6    Period  Weeks    Status  On-going            Plan - 02/15/17 1240    Clinical Impression Statement  Pt reports overall decrease in pain in Rt distal ITB since application of patch and stretches.  She tolerated exercises well, with only minor cues on form.  Pt progressing towards goals.     Rehab Potential  Good    PT Frequency  2x / week    PT Duration  6 weeks    PT Treatment/Interventions  Patient/family education;ADLs/Self Care Home Management;Cryotherapy;Electrical Stimulation;Iontophoresis 4mg /ml Dexamethasone;Moist Heat;Ultrasound;Dry needling;Manual techniques;Therapeutic activities;Therapeutic exercise    PT Next Visit Plan  continue manual therapy to Rt hip/thigh; progress HEP as tolerated.     Consulted and Agree with Plan of Care  Patient       Patient will benefit from skilled therapeutic intervention in order to improve the following deficits and impairments:  Postural dysfunction, Improper body mechanics, Increased fascial restricitons, Increased muscle spasms, Decreased strength, Decreased  mobility, Decreased range of motion, Decreased activity tolerance, Abnormal gait  Visit Diagnosis: Pain in right hip  Acute pain of right knee  Other symptoms and signs involving the musculoskeletal system     Problem List Patient Active Problem List   Diagnosis Date Noted  . Pain in right hip 06/22/2016  . Encounter for monitoring sotalol therapy 05/31/2016  . Chest pain   . CHF exacerbation ([place]) 02/29/2016  . Acute respiratory failure with hypoxia ([place]) 02/29/2016  . Persistent atrial fibrillation ([place])   . Hypertensive heart disease   . Hypercholesterolemia   . Pulmonary hypertension ([place])   . Dyspnea on exertion   . Non-obstructive CAD   . Chest pain, moderate coronary artery risk 07/14/2015  . Pain in the chest   . PAF (paroxysmal atrial fibrillation) (H[facility])   . Pulmonary arterial hypertension ([place])   . Chest pain with moderate risk for cardiac etiology 07/08/2015  . Dyspnea 07/08/2015  . Essential hypertension 07/08/2015  . Hyperlipidemia 07/08/2015  . Osteoarthritis of right hip 06/26/2015  . Hip joint replacement status 06/26/2015  . OSA  on CPAP 03/27/2015  . Ataxia 03/27/2015  . Falls 03/27/2015  . Insomnia, controlled 03/27/2015  . Insomnia with sleep apnea 03/22/2014  . Adjustment disorder with mixed anxiety and depressed mood 03/22/2014  . Sleep apnea with use of continuous positive airway pressure (CPAP) 10/19/2012  . Multifactorial gait disorder    Kerin Perna, PTA 02/15/17 12:42 PM  [place], [name], [address] Phone: [phone]   Fax:  [phone]  Name: Melinda [name] MRN: [id number] Date of Birth: [birth year]

## 2017-02-16 ENCOUNTER — Ambulatory Visit (INDEPENDENT_AMBULATORY_CARE_PROVIDER_SITE_OTHER): Payer: Medicare HMO

## 2017-02-16 ENCOUNTER — Ambulatory Visit (INDEPENDENT_AMBULATORY_CARE_PROVIDER_SITE_OTHER): Payer: Medicare HMO | Admitting: Physical Medicine and Rehabilitation

## 2017-02-16 ENCOUNTER — Encounter (INDEPENDENT_AMBULATORY_CARE_PROVIDER_SITE_OTHER): Payer: Self-pay | Admitting: Physical Medicine and Rehabilitation

## 2017-02-16 VITALS — BP 140/93 | HR 106 | Temp 97.6°F

## 2017-02-16 DIAGNOSIS — M25551 Pain in right hip: Secondary | ICD-10-CM | POA: Diagnosis not present

## 2017-02-16 DIAGNOSIS — M7071 Other bursitis of hip, right hip: Secondary | ICD-10-CM

## 2017-02-16 NOTE — Progress Notes (Signed)
Melinda [name] - 83 y.o. female MRN [id number]  Date of birth: 03-06-1932  Office Visit Note: Visit Date: 02/16/2017 PCP: Crist Infante, MD Referred by: Crist Infante, MD  Subjective: Chief Complaint  Patient presents with  . Lower Back - Pain   HPI: Melinda [name] is an 83 year old patient followed by Dr. Erlinda Hong who kindly sent her in today for planned right iliopsoas bursa injection under fluoroscopic guidance.  Prior right total hip replacement.    ROS Otherwise per HPI.  Assessment & Plan: Visit Diagnoses:  1. Pain in right hip   2. Iliopsoas bursitis of right hip     Plan: Findings:  Appear to be good contrast flow outlining iliopsoas bursa. During the anesthetic phase the patient did not seem to have much relief.    Meds & Orders: No orders of the defined types were placed in this encounter.   Orders Placed This Encounter  Procedures  . Large Joint Inj  . XR C-ARM NO REPORT    Follow-up: No Follow-up on file.   Procedures: Large Joint Inj (R. Iliopsoas bursa) on 02/16/2017 2:17 PM Indications: pain and diagnostic evaluation Details: 22 G 3.5 in needle, fluoroscopy-guided lateral approach  Arthrogram: No  Medications: 6 mL bupivacaine 0.25 %; 4 mL lidocaine 2 %; 80 mg triamcinolone acetonide 40 MG/ML Outcome: tolerated well, no immediate complications  There was excellent flow of contrast outlined the iliopsoas bursa without vascular uptake. Procedure, treatment alternatives, risks and benefits explained, specific risks discussed. Consent was given by the patient. Immediately prior to procedure a time out was called to verify the correct patient, procedure, equipment, support staff and site/side marked as required. Patient was prepped and draped in the usual sterile fashion.      No notes on file   Clinical History: No specialty comments available.  She reports that  has never smoked. she has never used smokeless tobacco. No results for input(s): HGBA1C,  LABURIC in the last 8760 hours.  Objective:  VS:  HT:    WT:   BMI:     BP:(!) 140/93  HR:(!) 106bpm  TEMP:97.6 F (36.4 C)(Oral)  RESP:95 % Physical Exam  Musculoskeletal:  Patient has good distal strength with some pain at end ranges of hip rotation.    Ortho Exam Imaging: No results found.  Past Medical/Family/Surgical/Social History: Medications & Allergies reviewed per EMR Patient Active Problem List   Diagnosis Date Noted  . Pain in right hip 06/22/2016  . Encounter for monitoring sotalol therapy 05/31/2016  . Chest pain   . CHF exacerbation ([place]) 02/29/2016  . Acute respiratory failure with hypoxia ([place]) 02/29/2016  . Persistent atrial fibrillation ([place])   . Hypertensive heart disease   . Hypercholesterolemia   . Pulmonary hypertension ([place])   . Dyspnea on exertion   . Non-obstructive CAD   . Chest pain, moderate coronary artery risk 07/14/2015  . Pain in the chest   . PAF (paroxysmal atrial fibrillation) (HCC)   . Pulmonary arterial hypertension ([place])   . Chest pain with moderate risk for cardiac etiology 07/08/2015  . Dyspnea 07/08/2015  . Essential hypertension 07/08/2015  . Hyperlipidemia 07/08/2015  . Osteoarthritis of right hip 06/26/2015  . Hip joint replacement status 06/26/2015  . OSA on CPAP 03/27/2015  . Ataxia 03/27/2015  . Falls 03/27/2015  . Insomnia, controlled 03/27/2015  . Insomnia with sleep apnea 03/22/2014  . Adjustment disorder with mixed anxiety and depressed mood 03/22/2014  . Sleep apnea with use of continuous positive  airway pressure (CPAP) 10/19/2012  . Multifactorial gait disorder    Past Medical History:  Diagnosis Date  . A-fib ([place])   . Acid reflux   . Anxiety   . Arthritis   . Cataract    surgery,bilateral  . Constipation   . Depression   . Diabetes mellitus without complication (HCC)    Pre diabetes  . Dyspnea on exertion    a. 07/2015 Echo: EF 55-60%, no rwma, mild MR, mod dil LA/RA/RV, mod TR, triv PR, PASP  31mmHg.  . Falls   . Family history of adverse reaction to anesthesia    mother always had n/v  . Hx: UTI (urinary tract infection)   . Hypercholesterolemia   . Hypertensive heart disease   . Insomnia   . Multifactorial gait disorder   . Non-obstructive CAD    a. 07/2015 Cath: LM 30-40d, LAD min irregs, LCX min irregs, RCA 30p, RA 17, RV 33/7, PA 42/19, PCWP 17.  . Osteoarthritis    a. 06/2015 s/p R total hip arthroplasty.  [name] PAF (paroxysmal atrial fibrillation) ([place])    a. Dx 07/2015, CHA2DS2VASc = 6-->Eliquis.  . Premature atrial contractions   . Pulmonary hypertension ([place])    a. 07/2015 Echo: PASP 39mmHg.  [name] Pyloric antral stenosis    in childhood,infancy  . Sleep apnea with use of continuous positive airway pressure (CPAP) 10/19/2012   a. compliant w/ CPAP.   Family History  Problem Relation Age of Onset  . Heart attack Father   . Heart failure Mother   . Cancer Mother        colon,kidney  . Diabetes Sister   . Hypertension Sister   . Heart failure Sister    Past Surgical History:  Procedure Laterality Date  . ABDOMINAL HYSTERECTOMY  1970  . ABDOMINAL SURGERY     as a 69 day old baby  . CARDIAC CATHETERIZATION N/A 07/14/2015   Procedure: Right/Left Heart Cath and Coronary Angiography;  Surgeon: Sanda Klein, MD;  Location: [facility];  Service: Cardiovascular;  Laterality: N/A;  . CARDIOVERSION N/A 11/04/2015   Procedure: CARDIOVERSION;  Surgeon: Lelon Perla, MD;  Location: [facility] ENDOSCOPY;  Service: Cardiovascular;  Laterality: N/A;  . CARDIOVERSION N/A 02/25/2016   Procedure: CARDIOVERSION;  Surgeon: Pixie Casino, MD;  Location: [facility] ENDOSCOPY;  Service: Cardiovascular;  Laterality: N/A;  . CARDIOVERSION N/A 12/13/2016   Procedure: CARDIOVERSION;  Surgeon: Fay Records, MD;  Location: [facility];  Service: Cardiovascular;  Laterality: N/A;  . COLONOSCOPY    . EYE SURGERY Bilateral    cataract surgery with lens implant  . INNER EAR SURGERY Left    x 3  .  JOINT REPLACEMENT Right   . LAPAROSCOPIC SALPINGO OOPHERECTOMY    . NM MYOVIEW LTD  11/30/2007   normal stress nuclear study/EF-83%  . TONSILLECTOMY    . TOTAL HIP ARTHROPLASTY Right 06/26/2015   Procedure: RIGHT TOTAL HIP ARTHROPLASTY ANTERIOR APPROACH;  Surgeon: Leandrew Koyanagi, MD;  Location: [facility];  Service: Orthopedics;  Laterality: Right;  . TRANSTHORACIC ECHOCARDIOGRAM  07/23/2009   mild concentric LVH/mild mitral insufficiency/ moderate tricuspid innsufficiency/mild pulmonary HTN/EF- 55-60%  . VAGINAL DELIVERY     Social History   Occupational History  . Occupation: Retired  Tobacco Use  . Smoking status: Never Smoker  . Smokeless tobacco: Never Used  Substance and Sexual Activity  . Alcohol use: No    Alcohol/week: 0.0 oz    Comment: rare  . Drug use: No  .  Sexual activity: Not on file

## 2017-02-16 NOTE — Progress Notes (Deleted)
Pt states lower back pain on the right side radiating to leg. Pt stated a fall on 02/15/17. +BT (Eliquis), -Dye Allergy, +Driver.

## 2017-02-17 ENCOUNTER — Ambulatory Visit (INDEPENDENT_AMBULATORY_CARE_PROVIDER_SITE_OTHER): Payer: Medicare HMO | Admitting: Rehabilitative and Restorative Service Providers"

## 2017-02-17 ENCOUNTER — Encounter: Payer: Self-pay | Admitting: Rehabilitative and Restorative Service Providers"

## 2017-02-17 DIAGNOSIS — M25561 Pain in right knee: Secondary | ICD-10-CM | POA: Diagnosis not present

## 2017-02-17 DIAGNOSIS — R29898 Other symptoms and signs involving the musculoskeletal system: Secondary | ICD-10-CM

## 2017-02-17 DIAGNOSIS — M25551 Pain in right hip: Secondary | ICD-10-CM

## 2017-02-17 NOTE — Therapy (Addendum)
[address] [place], [name], [address] Phone: [phone]   Fax:  [phone]  Physical Therapy Treatment  Patient Details  Name: Melinda [name] MRN: [id number] Date of Birth: Dec 03, 1931 Referring Provider: Dr Frankey Shown   Encounter Date: 02/17/2017  PT End of Session - 02/17/17 1458    Visit Number  4    Number of Visits  12    Date for PT Re-Evaluation  03/13/17    PT Start Time  1517    PT Stop Time  1542    PT Time Calculation (min)  46 min    Activity Tolerance  Patient tolerated treatment well       Past Medical History:  Diagnosis Date  . A-fib ([place])   . Acid reflux   . Anxiety   . Arthritis   . Cataract    surgery,bilateral  . Constipation   . Depression   . Diabetes mellitus without complication (HCC)    Pre diabetes  . Dyspnea on exertion    a. 07/2015 Echo: EF 55-60%, no rwma, mild MR, mod dil LA/RA/RV, mod TR, triv PR, PASP 79mmHg.  . Falls   . Family history of adverse reaction to anesthesia    mother always had n/v  . Hx: UTI (urinary tract infection)   . Hypercholesterolemia   . Hypertensive heart disease   . Insomnia   . Multifactorial gait disorder   . Non-obstructive CAD    a. 07/2015 Cath: LM 30-40d, LAD min irregs, LCX min irregs, RCA 30p, RA 17, RV 33/7, PA 42/19, PCWP 17.  . Osteoarthritis    a. 06/2015 s/p R total hip arthroplasty.  [name] PAF (paroxysmal atrial fibrillation) ([place])    a. Dx 07/2015, CHA2DS2VASc = 6-->Eliquis.  . Premature atrial contractions   . Pulmonary hypertension ([place])    a. 07/2015 Echo: PASP 1mmHg.  [name] Pyloric antral stenosis    in childhood,infancy  . Sleep apnea with use of continuous positive airway pressure (CPAP) 10/19/2012   a. compliant w/ CPAP.    Past Surgical History:  Procedure Laterality Date  . ABDOMINAL HYSTERECTOMY  1970  . ABDOMINAL SURGERY     as a 68 day old baby  . CARDIAC CATHETERIZATION N/A 07/14/2015   Procedure:  Right/Left Heart Cath and Coronary Angiography;  Surgeon: Sanda Klein, MD;  Location: [facility];  Service: Cardiovascular;  Laterality: N/A;  . CARDIOVERSION N/A 11/04/2015   Procedure: CARDIOVERSION;  Surgeon: Lelon Perla, MD;  Location: [facility] ENDOSCOPY;  Service: Cardiovascular;  Laterality: N/A;  . CARDIOVERSION N/A 02/25/2016   Procedure: CARDIOVERSION;  Surgeon: Pixie Casino, MD;  Location: [facility] ENDOSCOPY;  Service: Cardiovascular;  Laterality: N/A;  . CARDIOVERSION N/A 12/13/2016   Procedure: CARDIOVERSION;  Surgeon: Fay Records, MD;  Location: [facility];  Service: Cardiovascular;  Laterality: N/A;  . COLONOSCOPY    . EYE SURGERY Bilateral    cataract surgery with lens implant  . INNER EAR SURGERY Left    x 3  . JOINT REPLACEMENT Right   . LAPAROSCOPIC SALPINGO OOPHERECTOMY    . NM MYOVIEW LTD  11/30/2007   normal stress nuclear study/EF-83%  . TONSILLECTOMY    . TOTAL HIP ARTHROPLASTY Right 06/26/2015   Procedure: RIGHT TOTAL HIP ARTHROPLASTY ANTERIOR APPROACH;  Surgeon: Leandrew Koyanagi, MD;  Location: [facility];  Service: Orthopedics;  Laterality: Right;  . TRANSTHORACIC ECHOCARDIOGRAM  07/23/2009   mild concentric LVH/mild mitral insufficiency/ moderate tricuspid innsufficiency/mild pulmonary  HTN/EF- 55-60%  . VAGINAL DELIVERY      There were no vitals filed for this visit.  Subjective Assessment - 02/17/17 1459    Subjective  Had an injection in the front of the hip yesterday. The shot helped her hip and back feel better. She did not have any more pain in the back last night. Having pain and feeling of instability in the Rt knee today. maybe because she stood and talked to her friend for too long earlier today. She fell Tuesday after she left therapy. She was walking in the snow to visit her sister.     Currently in Pain?  Yes    Pain Score  2     Pain Location  Hip    Pain Orientation  Anterior;Right    Pain Descriptors / Indicators  Sore    Pain Type  Acute pain     Pain Onset  More than a month ago    Pain Frequency  Intermittent         [facility] PT Assessment - 02/17/17 0001      Assessment   Medical Diagnosis  Rt IT band symdrome; Rt psoas tendinitis     Referring Provider  Dr Frankey Shown    Onset Date/Surgical Date  12/06/16    Hand Dominance  Right    Next MD Visit  06/23/17      AROM   AROM Assessment Site  -- no change in AROM Rt hip or knee       Strength   Right/Left Knee  -- WFL's bilat       Palpation   Palpation comment  significant tightness Rt psoas and hip flexors; Rt lateral thigh at knee and proximal fibula; posterior hamstrings/proximal calf                   [facility] Adult PT Treatment/Exercise - 02/17/17 0001      Knee/Hip Exercises: Stretches   Hip Flexor Stretch  Right;30 seconds;4 reps    ITB Stretch  Right;30 seconds;2 reps supine with strap     Gastroc Stretch  Right;3 reps;30 seconds      Knee/Hip Exercises: Aerobic   Nustep  L4: 7 min       Knee/Hip Exercises: Standing   Hip Abduction  AROM;Right;Left;1 set;10 reps;Knee straight    Hip Extension  AROM;Right;Left;1 set;10 reps;Knee straight    Extension Limitations  with UE support; VC for posture and form.       Moist Heat Therapy   Number Minutes Moist Heat  20 Minutes    Moist Heat Location  Hip;Knee Rt anterior hip to lateral hip and lateral knee       Electrical Stimulation   Electrical Stimulation Location  anterior/lateral Rt hip x4; posterior lateral knee/calf    Electrical Stimulation Action  IFC/TENS    Electrical Stimulation Parameters  to tolerance    Electrical Stimulation Goals  Pain;Tone      Iontophoresis   Type of Iontophoresis  Dexamethasone    Location  lateral distal IT band; fibular head both on Rt     Dose  120 mAmp    Time  12 hours       Manual Therapy   Manual therapy comments  pt supine with LE's supported on bolster     Soft tissue mobilization  anterior Rt hip to lateral Rt hip and thigh to knee; posterior knee  and proximal calf  PT Long Term Goals - 02/15/17 1239      PT LONG TERM GOAL #1   Title  Improve tightness to palpation through the Rt hip flexors and IT band with patient reporting no to little pain with palpation 03/23/17    Time  6    Period  Weeks    Status  On-going      PT LONG TERM GOAL #2   Title  increase Rt hip strength to 4+/4 to 5-/5 allowing patient to get in and out of bed with greater ease and less pain 03/23/17    Time  6    Period  Weeks    Status  On-going      PT LONG TERM GOAL #3   Title  decrease pain Rt hip and Rt knee by 50-75% allowing patient to preform functional activities with less pain and greater ease and safety 03/23/17    Time  6    Period  Weeks    Status  On-going      PT LONG TERM GOAL #4   Title  Independent in HEP 03/23/17    Time  6    Period  Weeks    Status  On-going      PT LONG TERM GOAL #5   Title  Improve FOTO to </= 49% limtation /16/19    Time  6    Period  Weeks    Status  On-going            Plan - 02/17/17 1542    Clinical Impression Statement  Patient reports falling forward onto bilat knees Tuesday 02/15/17. She received an injection in the anterior Rt hip yesterday 02/16/17 and reports standing for extended period of time which may have created feling of instability in the Rt knee today. She reports increased pain in the posterior knee in proximal calf with manual work today. No change in ROM or strength noted today. Note increased pain with palpatioin posterior knee/proximal calf. Responded well to treatment.     Rehab Potential  Good    PT Frequency  2x / week    PT Duration  6 weeks    PT Treatment/Interventions  Patient/family education;ADLs/Self Care Home Management;Cryotherapy;Electrical Stimulation;Iontophoresis 4mg /ml Dexamethasone;Moist Heat;Ultrasound;Dry needling;Manual techniques;Therapeutic activities;Therapeutic exercise    PT Next Visit Plan  continue manual therapy to Rt  hip/thigh; progress HEP as tolerated. Hold resisted hip flexion progress with strengthening hip abduction and extension. Continue wit hmanual work anterior hip, hamstring and calf stretches.     Consulted and Agree with Plan of Care  Patient       Patient will benefit from skilled therapeutic intervention in order to improve the following deficits and impairments:  Postural dysfunction, Improper body mechanics, Increased fascial restricitons, Increased muscle spasms, Decreased strength, Decreased mobility, Decreased range of motion, Decreased activity tolerance, Abnormal gait  Visit Diagnosis: Pain in right hip  Acute pain of right knee  Other symptoms and signs involving the musculoskeletal system     Problem List Patient Active Problem List   Diagnosis Date Noted  . Pain in right hip 06/22/2016  . Encounter for monitoring sotalol therapy 05/31/2016  . Chest pain   . CHF exacerbation ([place]) 02/29/2016  . Acute respiratory failure with hypoxia ([place]) 02/29/2016  . Persistent atrial fibrillation ([place])   . Hypertensive heart disease   . Hypercholesterolemia   . Pulmonary hypertension ([place])   . Dyspnea on exertion   . Non-obstructive CAD   . Chest pain, moderate  coronary artery risk 07/14/2015  . Pain in the chest   . PAF (paroxysmal atrial fibrillation) (H[facility])   . Pulmonary arterial hypertension ([place])   . Chest pain with moderate risk for cardiac etiology 07/08/2015  . Dyspnea 07/08/2015  . Essential hypertension 07/08/2015  . Hyperlipidemia 07/08/2015  . Osteoarthritis of right hip 06/26/2015  . Hip joint replacement status 06/26/2015  . OSA on CPAP 03/27/2015  . Ataxia 03/27/2015  . Falls 03/27/2015  . Insomnia, controlled 03/27/2015  . Insomnia with sleep apnea 03/22/2014  . Adjustment disorder with mixed anxiety and depressed mood 03/22/2014  . Sleep apnea with use of continuous positive airway pressure (CPAP) 10/19/2012  . Multifactorial gait disorder     Celyn Nilda Simmer PT, MPH  02/17/2017, 4:07 PM  [facility] [place], [name], [address] Phone: [phone]   Fax:  [phone]  Name: Melinda [name] MRN: [id number] Date of Birth: 07-25-1931

## 2017-02-17 NOTE — Patient Instructions (Signed)
Achilles / Gastroc, Standing    Stand, right foot behind, heel on floor and turned slightly out, leg straight, forward leg bent. Move hips forward. Hold _30__ seconds. Repeat _3__ times per session. Do _2-3__ sessions per day.

## 2017-02-21 ENCOUNTER — Other Ambulatory Visit: Payer: Self-pay | Admitting: Internal Medicine

## 2017-02-21 DIAGNOSIS — Z1231 Encounter for screening mammogram for malignant neoplasm of breast: Secondary | ICD-10-CM

## 2017-02-22 ENCOUNTER — Encounter: Payer: Medicare HMO | Admitting: Rehabilitative and Restorative Service Providers"

## 2017-02-22 ENCOUNTER — Other Ambulatory Visit (HOSPITAL_COMMUNITY): Payer: Self-pay | Admitting: *Deleted

## 2017-02-22 ENCOUNTER — Telehealth: Payer: Self-pay | Admitting: Cardiology

## 2017-02-22 MED ORDER — BUPIVACAINE HCL 0.25 % IJ SOLN
6.0000 mL | INTRAMUSCULAR | Status: AC | PRN
  Administered 2017-02-16: 6 mL via INTRA_ARTICULAR

## 2017-02-22 MED ORDER — TRIAMCINOLONE ACETONIDE 40 MG/ML IJ SUSP
80.0000 mg | INTRAMUSCULAR | Status: AC | PRN
  Administered 2017-02-16: 80 mg via INTRA_ARTICULAR

## 2017-02-22 MED ORDER — LIDOCAINE HCL 2 % IJ SOLN
4.0000 mL | INTRAMUSCULAR | Status: AC | PRN
  Administered 2017-02-16: 4 mL

## 2017-02-22 NOTE — Telephone Encounter (Signed)
error 

## 2017-02-22 NOTE — Telephone Encounter (Signed)
[id number] 

## 2017-02-22 NOTE — Telephone Encounter (Signed)
°  New Prob  Patient c/o Palpitations:  High priority if patient c/o lightheadedness, shortness of breath, or chest pain  1) How long have you had palpitations/irregular HR/ Afib? Are you having the symptoms now?   Yes, for about 2 days. States her heart is out of rhythm now.  2) Are you currently experiencing lightheadedness, SOB or CP?  No  3) Do you have a history of afib (atrial fibrillation) or irregular heart rhythm?  Yes, A-Fib  4) Have you checked your BP or HR? (document readings if available):   Yes, HR is in 120's  5) Are you experiencing any other symptoms?  Nausea and Fatigue

## 2017-02-23 ENCOUNTER — Emergency Department (HOSPITAL_COMMUNITY): Payer: Medicare HMO

## 2017-02-23 ENCOUNTER — Telehealth (HOSPITAL_COMMUNITY): Payer: Self-pay | Admitting: *Deleted

## 2017-02-23 ENCOUNTER — Inpatient Hospital Stay (HOSPITAL_COMMUNITY)
Admission: EM | Admit: 2017-02-23 | Discharge: 2017-02-25 | DRG: 309 | Disposition: A | Discharge status: Home / Self Care | Payer: Medicare HMO | Attending: Internal Medicine | Admitting: Internal Medicine

## 2017-02-23 ENCOUNTER — Encounter (HOSPITAL_COMMUNITY): Payer: Self-pay | Admitting: Emergency Medicine

## 2017-02-23 DIAGNOSIS — I5032 Chronic diastolic (congestive) heart failure: Secondary | ICD-10-CM | POA: Diagnosis present

## 2017-02-23 DIAGNOSIS — R9431 Abnormal electrocardiogram [ECG] [EKG]: Secondary | ICD-10-CM | POA: Diagnosis not present

## 2017-02-23 DIAGNOSIS — Z8249 Family history of ischemic heart disease and other diseases of the circulatory system: Secondary | ICD-10-CM

## 2017-02-23 DIAGNOSIS — E1122 Type 2 diabetes mellitus with diabetic chronic kidney disease: Secondary | ICD-10-CM | POA: Diagnosis present

## 2017-02-23 DIAGNOSIS — Z833 Family history of diabetes mellitus: Secondary | ICD-10-CM

## 2017-02-23 DIAGNOSIS — R000 Tachycardia, unspecified: Secondary | ICD-10-CM

## 2017-02-23 DIAGNOSIS — I2510 Atherosclerotic heart disease of native coronary artery without angina pectoris: Secondary | ICD-10-CM | POA: Diagnosis present

## 2017-02-23 DIAGNOSIS — I4819 Other persistent atrial fibrillation: Secondary | ICD-10-CM | POA: Diagnosis present

## 2017-02-23 DIAGNOSIS — Z96641 Presence of right artificial hip joint: Secondary | ICD-10-CM | POA: Diagnosis present

## 2017-02-23 DIAGNOSIS — I130 Hypertensive heart and chronic kidney disease with heart failure and stage 1 through stage 4 chronic kidney disease, or unspecified chronic kidney disease: Secondary | ICD-10-CM | POA: Diagnosis present

## 2017-02-23 DIAGNOSIS — I484 Atypical atrial flutter: Secondary | ICD-10-CM | POA: Diagnosis not present

## 2017-02-23 DIAGNOSIS — Z7901 Long term (current) use of anticoagulants: Secondary | ICD-10-CM

## 2017-02-23 DIAGNOSIS — Z90710 Acquired absence of both cervix and uterus: Secondary | ICD-10-CM

## 2017-02-23 DIAGNOSIS — R270 Ataxia, unspecified: Secondary | ICD-10-CM

## 2017-02-23 DIAGNOSIS — F4323 Adjustment disorder with mixed anxiety and depressed mood: Secondary | ICD-10-CM | POA: Diagnosis present

## 2017-02-23 DIAGNOSIS — R2689 Other abnormalities of gait and mobility: Secondary | ICD-10-CM | POA: Diagnosis present

## 2017-02-23 DIAGNOSIS — Z79899 Other long term (current) drug therapy: Secondary | ICD-10-CM

## 2017-02-23 DIAGNOSIS — N179 Acute kidney failure, unspecified: Secondary | ICD-10-CM | POA: Diagnosis present

## 2017-02-23 DIAGNOSIS — R079 Chest pain, unspecified: Secondary | ICD-10-CM | POA: Diagnosis not present

## 2017-02-23 DIAGNOSIS — I10 Essential (primary) hypertension: Secondary | ICD-10-CM | POA: Diagnosis present

## 2017-02-23 DIAGNOSIS — N183 Chronic kidney disease, stage 3 (moderate): Secondary | ICD-10-CM | POA: Diagnosis present

## 2017-02-23 DIAGNOSIS — R072 Precordial pain: Secondary | ICD-10-CM | POA: Diagnosis not present

## 2017-02-23 DIAGNOSIS — E785 Hyperlipidemia, unspecified: Secondary | ICD-10-CM | POA: Diagnosis present

## 2017-02-23 DIAGNOSIS — K219 Gastro-esophageal reflux disease without esophagitis: Secondary | ICD-10-CM | POA: Diagnosis present

## 2017-02-23 DIAGNOSIS — G4733 Obstructive sleep apnea (adult) (pediatric): Secondary | ICD-10-CM

## 2017-02-23 DIAGNOSIS — I480 Paroxysmal atrial fibrillation: Secondary | ICD-10-CM | POA: Diagnosis present

## 2017-02-23 DIAGNOSIS — R296 Repeated falls: Secondary | ICD-10-CM | POA: Diagnosis present

## 2017-02-23 DIAGNOSIS — I2721 Secondary pulmonary arterial hypertension: Secondary | ICD-10-CM | POA: Diagnosis present

## 2017-02-23 DIAGNOSIS — G4730 Sleep apnea, unspecified: Secondary | ICD-10-CM | POA: Diagnosis present

## 2017-02-23 DIAGNOSIS — I481 Persistent atrial fibrillation: Secondary | ICD-10-CM | POA: Diagnosis present

## 2017-02-23 DIAGNOSIS — E7800 Pure hypercholesterolemia, unspecified: Secondary | ICD-10-CM | POA: Diagnosis present

## 2017-02-23 DIAGNOSIS — Z9989 Dependence on other enabling machines and devices: Secondary | ICD-10-CM

## 2017-02-23 DIAGNOSIS — E119 Type 2 diabetes mellitus without complications: Secondary | ICD-10-CM

## 2017-02-23 DIAGNOSIS — W19XXXA Unspecified fall, initial encounter: Secondary | ICD-10-CM | POA: Diagnosis present

## 2017-02-23 DIAGNOSIS — I4891 Unspecified atrial fibrillation: Secondary | ICD-10-CM | POA: Diagnosis present

## 2017-02-23 DIAGNOSIS — M1611 Unilateral primary osteoarthritis, right hip: Secondary | ICD-10-CM | POA: Diagnosis present

## 2017-02-23 DIAGNOSIS — K5909 Other constipation: Secondary | ICD-10-CM | POA: Diagnosis present

## 2017-02-23 DIAGNOSIS — I340 Nonrheumatic mitral (valve) insufficiency: Secondary | ICD-10-CM | POA: Diagnosis not present

## 2017-02-23 HISTORY — DX: Dependence on supplemental oxygen: Z99.81

## 2017-02-23 LAB — LIPID PANEL
CHOL/HDL RATIO: 2.5 ratio
Cholesterol: 153 mg/dL (ref 0–200)
HDL: 62 mg/dL (ref >40)
LDL CALC: 64 mg/dL (ref 0–99)
Triglycerides: 137 mg/dL (ref <150)
VLDL: 27 mg/dL (ref 0–40)

## 2017-02-23 LAB — CBG MONITORING, ED
GLUCOSE-CAPILLARY: 163 mg/dL — AB (ref 65–99)
GLUCOSE-CAPILLARY: 162 mg/dL — AB (ref 65–99)
Glucose-Capillary: 161 mg/dL — ABNORMAL HIGH (ref 65–99)

## 2017-02-23 LAB — BASIC METABOLIC PANEL
Anion gap: 6 (ref 5–15)
BUN: 32 mg/dL — AB (ref 6–20)
CO2: 26 mmol/L (ref 22–32)
Calcium: 9.2 mg/dL (ref 8.9–10.3)
Chloride: 103 mmol/L (ref 101–111)
Creatinine, Ser: 1.31 mg/dL — ABNORMAL HIGH (ref 0.44–1.00)
GFR calc Af Amer: 42 mL/min — ABNORMAL LOW (ref >60)
GFR, EST NON AFRICAN AMERICAN: 37 mL/min — AB (ref >60)
GLUCOSE: 163 mg/dL — AB (ref 65–99)
POTASSIUM: 4.5 mmol/L (ref 3.5–5.1)
Sodium: 135 mmol/L (ref 135–145)

## 2017-02-23 LAB — CBC
HEMATOCRIT: 38.4 % (ref 36.0–46.0)
Hemoglobin: 13 g/dL (ref 12.0–15.0)
MCH: 31.9 pg (ref 26.0–34.0)
MCHC: 33.9 g/dL (ref 30.0–36.0)
MCV: 94.3 fL (ref 78.0–100.0)
Platelets: 181 10*3/uL (ref 150–400)
RBC: 4.07 MIL/uL (ref 3.87–5.11)
RDW: 13.2 % (ref 11.5–15.5)
WBC: 9.1 10*3/uL (ref 4.0–10.5)

## 2017-02-23 LAB — I-STAT TROPONIN, ED: Troponin i, poc: 0.01 ng/mL (ref 0.00–0.08)

## 2017-02-23 LAB — TROPONIN I
Troponin I: <0.03 ng/mL (ref <0.03)
Troponin I: <0.03 ng/mL (ref <0.03)
Troponin I: <0.03 ng/mL (ref <0.03)

## 2017-02-23 LAB — GLUCOSE, CAPILLARY
Glucose-Capillary: 181 mg/dL — ABNORMAL HIGH (ref 65–99)
Glucose-Capillary: 156 mg/dL — ABNORMAL HIGH (ref 65–99)

## 2017-02-23 LAB — HEMOGLOBIN A1C
Hgb A1c MFr Bld: 7 % — ABNORMAL HIGH (ref 4.8–5.6)
Mean Plasma Glucose: 154.2 mg/dL

## 2017-02-23 LAB — D-DIMER, QUANTITATIVE: D-Dimer, Quant: <0.27 ug/mL-FEU (ref 0.00–0.50)

## 2017-02-23 MED ORDER — INSULIN ASPART 100 UNIT/ML ~~LOC~~ SOLN
0.0000 [IU] | Freq: Three times a day (TID) | SUBCUTANEOUS | Status: DC
  Administered 2017-02-23 (×3): 2 [IU] via SUBCUTANEOUS
  Administered 2017-02-24: 1 [IU] via SUBCUTANEOUS
  Administered 2017-02-24: 2 [IU] via SUBCUTANEOUS
  Administered 2017-02-24 – 2017-02-25 (×2): 1 [IU] via SUBCUTANEOUS
  Filled 2017-02-23 (×2): qty 1

## 2017-02-23 MED ORDER — DICLOFENAC SODIUM 1 % TD GEL
2.0000 g | Freq: Four times a day (QID) | TRANSDERMAL | Status: DC
  Administered 2017-02-23: 22:00:00 2 g via TOPICAL
  Filled 2017-02-23: qty 100

## 2017-02-23 MED ORDER — MORPHINE SULFATE (PF) 4 MG/ML IV SOLN: 1.0000 mg | INTRAVENOUS | Status: DC | PRN

## 2017-02-23 MED ORDER — AMLODIPINE BESYLATE 5 MG PO TABS
2.5000 mg | ORAL_TABLET | Freq: Every day | ORAL | Status: DC
  Administered 2017-02-23 – 2017-02-24 (×2): 2.5 mg via ORAL
  Filled 2017-02-23 (×2): qty 1

## 2017-02-23 MED ORDER — LINACLOTIDE 145 MCG PO CAPS
145.0000 ug | ORAL_CAPSULE | Freq: Every day | ORAL | Status: DC
  Administered 2017-02-24 – 2017-02-25 (×2): 145 ug via ORAL
  Filled 2017-02-23 (×3): qty 1

## 2017-02-23 MED ORDER — TORSEMIDE 20 MG PO TABS
10.0000 mg | ORAL_TABLET | Freq: Every day | ORAL | Status: DC
Start: 2017-02-23 — End: 2017-02-25
  Administered 2017-02-23 – 2017-02-25 (×3): 10 mg via ORAL
  Filled 2017-02-23 (×3): qty 1

## 2017-02-23 MED ORDER — GLUCOSAMINE-CHONDROITIN 500-400 MG PO TABS: 1.0000 | ORAL_TABLET | ORAL | Status: DC

## 2017-02-23 MED ORDER — APIXABAN 5 MG PO TABS
5.0000 mg | ORAL_TABLET | Freq: Two times a day (BID) | ORAL | Status: DC
  Administered 2017-02-23 – 2017-02-25 (×5): 5 mg via ORAL
  Filled 2017-02-23 (×5): qty 1

## 2017-02-23 MED ORDER — CALCIUM CARBONATE-VITAMIN D3 600-400 MG-UNIT PO TABS: 1.0000 | ORAL_TABLET | Freq: Every day | ORAL | Status: DC

## 2017-02-23 MED ORDER — DESIPRAMINE HCL 25 MG PO TABS
25.0000 mg | ORAL_TABLET | Freq: Every day | ORAL | Status: DC
  Administered 2017-02-23 – 2017-02-25 (×3): 25 mg via ORAL
  Filled 2017-02-23 (×3): qty 1

## 2017-02-23 MED ORDER — SOTALOL HCL 80 MG PO TABS
160.0000 mg | ORAL_TABLET | Freq: Every morning | ORAL | Status: DC
  Filled 2017-02-23: qty 2

## 2017-02-23 MED ORDER — POTASSIUM CHLORIDE CRYS ER 10 MEQ PO TBCR
20.0000 meq | EXTENDED_RELEASE_TABLET | Freq: Every day | ORAL | Status: DC
  Administered 2017-02-23 – 2017-02-25 (×3): 20 meq via ORAL
  Filled 2017-02-23 (×3): qty 2

## 2017-02-23 MED ORDER — ASPIRIN 325 MG PO TABS
325.0000 mg | ORAL_TABLET | Freq: Every day | ORAL | Status: DC
  Administered 2017-02-23 – 2017-02-25 (×3): 325 mg via ORAL
  Filled 2017-02-23 (×3): qty 1

## 2017-02-23 MED ORDER — SODIUM CHLORIDE 0.9 % IV SOLN
INTRAVENOUS | Status: DC
  Administered 2017-02-23: 05:00:00 via INTRAVENOUS

## 2017-02-23 MED ORDER — FAMOTIDINE 20 MG PO TABS
20.0000 mg | ORAL_TABLET | Freq: Every day | ORAL | Status: DC
  Administered 2017-02-23 – 2017-02-25 (×3): 20 mg via ORAL
  Filled 2017-02-23 (×3): qty 1

## 2017-02-23 MED ORDER — CALCIUM CARBONATE-VITAMIN D 500-200 MG-UNIT PO TABS
1.0000 | ORAL_TABLET | Freq: Every day | ORAL | Status: DC
  Administered 2017-02-23 – 2017-02-25 (×3): 1 via ORAL
  Filled 2017-02-23 (×3): qty 1

## 2017-02-23 MED ORDER — LORAZEPAM 0.5 MG PO TABS
0.5000 mg | ORAL_TABLET | Freq: Every day | ORAL | Status: DC
  Administered 2017-02-24: 0.5 mg via ORAL
  Filled 2017-02-23 (×2): qty 1

## 2017-02-23 MED ORDER — VITAMIN B-12 1000 MCG PO TABS
1000.0000 ug | ORAL_TABLET | Freq: Every day | ORAL | Status: DC
  Administered 2017-02-23 – 2017-02-25 (×3): 1000 ug via ORAL
  Filled 2017-02-23 (×4): qty 1

## 2017-02-23 MED ORDER — SODIUM CHLORIDE 0.9 % IV BOLUS (SEPSIS)
1000.0000 mL | Freq: Once | INTRAVENOUS | Status: AC
  Administered 2017-02-23: 1000 mL via INTRAVENOUS

## 2017-02-23 MED ORDER — POLYVINYL ALCOHOL 1.4 % OP SOLN: 1.0000 [drp] | Freq: Every evening | OPHTHALMIC | Status: DC | PRN

## 2017-02-23 MED ORDER — VITAMIN D 1000 UNITS PO TABS
1000.0000 [IU] | ORAL_TABLET | Freq: Every day | ORAL | Status: DC
  Administered 2017-02-23 – 2017-02-25 (×3): 1000 [IU] via ORAL
  Filled 2017-02-23 (×3): qty 1

## 2017-02-23 MED ORDER — METOPROLOL SUCCINATE ER 25 MG PO TB24
25.0000 mg | ORAL_TABLET | Freq: Every day | ORAL | Status: DC
  Administered 2017-02-23 – 2017-02-24 (×2): 25 mg via ORAL
  Filled 2017-02-23 (×2): qty 1

## 2017-02-23 MED ORDER — ATORVASTATIN CALCIUM 40 MG PO TABS
40.0000 mg | ORAL_TABLET | Freq: Every day | ORAL | Status: DC
  Administered 2017-02-23 – 2017-02-25 (×3): 40 mg via ORAL
  Filled 2017-02-23 (×3): qty 1

## 2017-02-23 MED ORDER — METOPROLOL SUCCINATE ER 25 MG PO TB24: 25.0000 mg | ORAL_TABLET | Freq: Every day | ORAL | Status: DC

## 2017-02-23 MED ORDER — ALUM & MAG HYDROXIDE-SIMETH 200-200-20 MG/5ML PO SUSP: 15.0000 mL | Freq: Four times a day (QID) | ORAL | Status: DC | PRN

## 2017-02-23 MED ORDER — METOPROLOL SUCCINATE ER 25 MG PO TB24: 12.5000 mg | ORAL_TABLET | Freq: Every day | ORAL | Status: DC

## 2017-02-23 NOTE — ED Notes (Signed)
Pt received breakfast tray 

## 2017-02-23 NOTE — ED Provider Notes (Signed)
[facility] DEPART[facility] Provider Note   CSN: [id number] Arrival date & time: 02/23/17  0043     History   Chief Complaint Chief Complaint  Patient presents with  . Atrial Fibrillation  . Chest Pain  . Shortness of Breath    HPI Melinda [name] is a 83 y.o. female.  The history is provided by the patient.  Chest Pain   This is a new problem. The current episode started 1 to 2 hours ago. The problem occurs constantly. The problem has been resolved. The pain is present in the substernal region. The pain is severe. The quality of the pain is described as pressure-like. Radiates to: neck. Exacerbated by: ASA. Pertinent negatives include no diaphoresis, no shortness of breath and no vomiting. Risk factors include being elderly.  Shortness of Breath  Associated symptoms include chest pain. Pertinent negatives include no vomiting.  patient with h/o paroxysmal afib, diabetes, presents with chest pain/sob prior to arrival This occurred while in bed and was severe, as if there was someone sitting on her chest She took ASA and feels improved She has h/o afib and thought she might be back in that rhythm   Past Medical History:  Diagnosis Date  . A-fib ([place])   . Acid reflux   . Anxiety   . Arthritis   . Cataract    surgery,bilateral  . Constipation   . Depression   . Diabetes mellitus without complication (HCC)    Pre diabetes  . Dyspnea on exertion    a. 07/2015 Echo: EF 55-60%, no rwma, mild MR, mod dil LA/RA/RV, mod TR, triv PR, PASP 45mmHg.  . Falls   . Family history of adverse reaction to anesthesia    mother always had n/v  . Hx: UTI (urinary tract infection)   . Hypercholesterolemia   . Hypertensive heart disease   . Insomnia   . Multifactorial gait disorder   . Non-obstructive CAD    a. 07/2015 Cath: LM 30-40d, LAD min irregs, LCX min irregs, RCA 30p, RA 17, RV 33/7, PA 42/19, PCWP 17.  . Osteoarthritis    a. 06/2015 s/p R total hip  arthroplasty.  [name] PAF (paroxysmal atrial fibrillation) ([place])    a. Dx 07/2015, CHA2DS2VASc = 6-->Eliquis.  . Premature atrial contractions   . Pulmonary hypertension ([place])    a. 07/2015 Echo: PASP 13mmHg.  [name] Pyloric antral stenosis    in childhood,infancy  . Sleep apnea with use of continuous positive airway pressure (CPAP) 10/19/2012   a. compliant w/ CPAP.    Patient Active Problem List   Diagnosis Date Noted  . Pain in right hip 06/22/2016  . Encounter for monitoring sotalol therapy 05/31/2016  . Chest pain   . CHF exacerbation ([place]) 02/29/2016  . Acute respiratory failure with hypoxia ([place]) 02/29/2016  . Persistent atrial fibrillation ([place])   . Hypertensive heart disease   . Hypercholesterolemia   . Pulmonary hypertension ([place])   . Dyspnea on exertion   . Non-obstructive CAD   . Chest pain, moderate coronary artery risk 07/14/2015  . Pain in the chest   . PAF (paroxysmal atrial fibrillation) (HCC)   . Pulmonary arterial hypertension ([place])   . Chest pain with moderate risk for cardiac etiology 07/08/2015  . Dyspnea 07/08/2015  . Essential hypertension 07/08/2015  . Hyperlipidemia 07/08/2015  . Osteoarthritis of right hip 06/26/2015  . Hip joint replacement status 06/26/2015  . OSA on CPAP 03/27/2015  . Ataxia 03/27/2015  . Falls  03/27/2015  . Insomnia, controlled 03/27/2015  . Insomnia with sleep apnea 03/22/2014  . Adjustment disorder with mixed anxiety and depressed mood 03/22/2014  . Sleep apnea with use of continuous positive airway pressure (CPAP) 10/19/2012  . Multifactorial gait disorder     Past Surgical History:  Procedure Laterality Date  . ABDOMINAL HYSTERECTOMY  1970  . ABDOMINAL SURGERY     as a 21 day old baby  . CARDIAC CATHETERIZATION N/A 07/14/2015   Procedure: Right/Left Heart Cath and Coronary Angiography;  Surgeon: Sanda Klein, MD;  Location: [facility];  Service: Cardiovascular;  Laterality: N/A;  . CARDIOVERSION N/A 11/04/2015    Procedure: CARDIOVERSION;  Surgeon: Lelon Perla, MD;  Location: [facility] ENDOSCOPY;  Service: Cardiovascular;  Laterality: N/A;  . CARDIOVERSION N/A 02/25/2016   Procedure: CARDIOVERSION;  Surgeon: Pixie Casino, MD;  Location: [facility] ENDOSCOPY;  Service: Cardiovascular;  Laterality: N/A;  . CARDIOVERSION N/A 12/13/2016   Procedure: CARDIOVERSION;  Surgeon: Fay Records, MD;  Location: [facility];  Service: Cardiovascular;  Laterality: N/A;  . COLONOSCOPY    . EYE SURGERY Bilateral    cataract surgery with lens implant  . INNER EAR SURGERY Left    x 3  . JOINT REPLACEMENT Right   . LAPAROSCOPIC SALPINGO OOPHERECTOMY    . NM MYOVIEW LTD  11/30/2007   normal stress nuclear study/EF-83%  . TONSILLECTOMY    . TOTAL HIP ARTHROPLASTY Right 06/26/2015   Procedure: RIGHT TOTAL HIP ARTHROPLASTY ANTERIOR APPROACH;  Surgeon: Leandrew Koyanagi, MD;  Location: [facility];  Service: Orthopedics;  Laterality: Right;  . TRANSTHORACIC ECHOCARDIOGRAM  07/23/2009   mild concentric LVH/mild mitral insufficiency/ moderate tricuspid innsufficiency/mild pulmonary HTN/EF- 55-60%  . VAGINAL DELIVERY      OB History    No data available       Home Medications    Prior to Admission medications   Medication Sig Start Date End Date Taking? Authorizing Provider  acetaminophen (TYLENOL) 650 MG CR tablet Take 650-1,300 mg by mouth 2 (two) times daily as needed for pain (arthritis pain).     [provider]  alum & mag hydroxide-simeth (MAALOX/MYLANTA) 200-200-20 MG/5ML suspension Take 15 mLs by mouth every 6 (six) hours as needed for indigestion or heartburn.    [provider]  amLODipine (NORVASC) 5 MG tablet Take 2.5 mg by mouth daily.     [provider]  apixaban (ELIQUIS) 5 MG TABS tablet Take 1 tablet (5 mg total) by mouth 2 (two) times daily. 07/15/15   Robbie Lis, MD  atorvastatin (LIPITOR) 40 MG tablet Take 40 mg by mouth daily.     [provider]  Calcium Carbonate-Vitamin D3  (CALCIUM 600+D3) 600-400 MG-UNIT TABS Take 1 tablet by mouth daily.    [provider]  cholecalciferol (VITAMIN D) 1000 UNITS tablet Take 1,000 Units by mouth daily.     [provider]  desipramine (NORPRAMIN) 25 MG tablet Take 25 mg by mouth daily.    [provider]  diclofenac sodium (VOLTAREN) 1 % GEL Apply 2 g topically 4 (four) times daily. 02/01/17   Leandrew Koyanagi, MD  famotidine (PEPCID) 20 MG tablet Take 20 mg by mouth daily.    [provider]  glucosamine-chondroitin 500-400 MG tablet Take 1 tablet by mouth every other day.     [provider]  KLOR-CON 10 10 MEQ tablet TAKE 2 TABLETS (20 MEQ TOTAL) BY MOUTH DAILY. 11/25/16 02/23/17  Sherran Needs, NP  linaclotide (LINZESS) 145 MCG CAPS capsule Take 145 mcg by mouth daily before breakfast.     [provider]  lisinopril (PRINIVIL,ZESTRIL) 20 MG tablet Take 20 mg by mouth daily.    [provider]  LORazepam (ATIVAN) 0.5 MG tablet Take 1 mg by mouth at bedtime.  12/26/15   [provider]  metoprolol succinate (TOPROL-XL) 25 MG 24 hr tablet Take 0.5 tablets (12.5 mg total) by mouth at bedtime. 12/07/16   Sherran Needs, NP  Multiple Vitamins-Minerals (OCUVITE PO) Take 1 tablet by mouth daily.    [provider]  omeprazole (PRILOSEC) 20 MG capsule Take 20 mg by mouth daily.    [provider]  Polyethyl Glycol-Propyl Glycol (SYSTANE) 0.4-0.3 % GEL ophthalmic gel Place 1 application into both eyes at bedtime as needed (dry eyes).    [provider]  polyethylene glycol (MIRALAX / GLYCOLAX) packet Take 17 g by mouth daily. Mix in 8 oz liquid and drink    [provider]  PRESCRIPTION MEDICATION Inhale into the lungs at bedtime. Uses CPAP with oxygen    [provider]  sotalol (BETAPACE) 160 MG tablet TAKE 1 TABLET BY MOUTH IN THE MORNING 02/15/17   Sherran Needs, NP  torsemide (DEMADEX) 10 MG tablet Take 1 tablet (10  mg total) by mouth daily. Patient taking differently: Take 20 mg by mouth every other day.  03/03/16   Lavina Hamman, MD  vitamin B-12 (CYANOCOBALAMIN) 1000 MCG tablet Take 1,000 mcg by mouth daily.    [provider]    Family History Family History  Problem Relation Age of Onset  . Heart attack Father   . Heart failure Mother   . Cancer Mother        colon,kidney  . Diabetes Sister   . Hypertension Sister   . Heart failure Sister     Social History Social History   Tobacco Use  . Smoking status: Never Smoker  . Smokeless tobacco: Never Used  Substance Use Topics  . Alcohol use: No    Alcohol/week: 0.0 oz    Comment: rare  . Drug use: No     Allergies   Lasix [furosemide]; Phenobarbital; and Amitiza [lubiprostone]   Review of Systems Review of Systems  Constitutional: Negative for diaphoresis.  Respiratory: Negative for shortness of breath.   Cardiovascular: Positive for chest pain.  Gastrointestinal: Negative for vomiting.  All other systems reviewed and are negative.    Physical Exam Updated Vital Signs BP (!) 150/104 (BP Location: Right Arm)   Pulse (!) 118   Temp 98.3 F (36.8 C) (Oral)   Resp (!) 25   Ht 1.524 m (5')   Wt 77.6 kg (171 lb)   SpO2 95%   BMI 33.40 kg/m   Physical Exam CONSTITUTIONAL: elderly, no distress HEAD: Normocephalic/atraumatic EYES: EOMI/PERRL ENMT: Mucous membranes moist NECK: supple no meningeal signs SPINE/BACK:entire spine nontender CV: S1/S2 noted, no murmurs/rubs/gallops noted, tachycardic LUNGS: Lungs are clear to auscultation bilaterally, no apparent distress ABDOMEN: soft, nontender, no rebound or guarding, bowel sounds noted throughout abdomen GU:no cva tenderness NEURO: Pt is awake/alert/appropriate, moves all extremitiesx4.  No facial droop.   EXTREMITIES: pulses normal/equal, full ROM SKIN: warm, color normal PSYCH: no abnormalities of mood noted, alert and oriented to situation   ED  Treatments / Results  Labs (all labs ordered are listed, but only abnormal results are displayed) Labs Reviewed  BASIC METABOLIC PANEL - Abnormal; Notable for the following components:  Result Value   Glucose, Bld 163 (*)    BUN 32 (*)    Creatinine, Ser 1.31 (*)    GFR calc non Af Amer 37 (*)    GFR calc Af Amer 42 (*)    All other components within normal limits  CBC  I-STAT TROPONIN, ED    EKG  EKG Interpretation  Date/Time:  Wednesday February 23 2017 00:46:29 EST Ventricular Rate:  117 PR Interval:    QRS Duration: 132 QT Interval:  385 QTC Calculation: 538 R Axis:   -68 Text Interpretation:  Sinus tachycardia Nonspecific IVCD with LAD Probable inferior infarct, age indeterminate Anterior infarct, old Baseline wander in lead(s) V1 No significant change since last tracing Confirmed by Ripley Fraise [phone]) on 02/23/2017 12:52:09 AM       Radiology Dg Chest 2 View  Result Date: 02/23/2017 CLINICAL DATA:  Chest pain EXAM: CHEST  2 VIEW COMPARISON:  Chest radiograph 09/04/2016 FINDINGS: Mild cardiomegaly. Unchanged elevation of the right hemidiaphragm. No focal airspace consolidation or pulmonary edema. No pleural effusion or pneumothorax. IMPRESSION: No active cardiopulmonary disease. Electronically Signed   By: Ulyses Jarred M.D.   On: 02/23/2017 01:23    Procedures Procedures    Medications Ordered in ED Medications  0.9 %  sodium chloride infusion (not administered)  sodium chloride 0.9 % bolus 1,000 mL (1,000 mLs Intravenous New Bag/Given 02/23/17 0334)     Initial Impression / Assessment and Plan / ED Course  I have reviewed the triage vital signs and the nursing notes.  Pertinent labs  results that were available during my care of the patient were reviewed by me and considered in my medical decision making (see chart for details).     Patient with history of known CAD, paroxysmal A. fib presents with episode of chest pain that is now  resolved. Due to age and history, I feel she is to be admitted the hospital for further monitoring Currently she appears to be in sinus tach No pleuritic chest pain reported, I have a low suspicion for acute PE at this time Discussed with Dr. Tamala Julian with [facility]  for admission  Final Clinical Impressions(s) / ED Diagnoses   Final diagnoses:  Sinus tachycardia  Chest pain, rule out acute myocardial infarction    ED Discharge Orders    None       Ripley Fraise, MD 02/23/17 [phone]

## 2017-02-23 NOTE — H&P (Signed)
History and Physical    Melinda [name] GDJ:[id number] DOB: 1932-01-12 DOA: 02/23/2017   PCP: Crist Infante, MD   Patient coming from:  Home    Chief Complaint: Chest pain   HPI: Melinda [name] is a 83 y.o. female with medical history significant for diabetes, GERD, CAD, atrial fibrillation, anxiety, osteoarthritis,osteoarthritis, brought to the ER with severe substernal chest pain radiating to the jaw, acute, starting around 10 PM, lasting for about one hour. She reports having similar episodes over the last 6 months, when the symptoms appeared to bedtime. She took a baby aspirin, and the symptoms improved. She did not receive nitroglycerin in her morphine in transport. She denies any new changes in her medications and she is compliant with him. She denies any pleuritic chest pain. She has been feeling increasingly weak, as well as as experiencing dyspnea on exertion in the setting of deconditioning. Patient denies any new herbal products, or hormonal meds. She denies any new stressors. No recent long distance trips. No recent infections. She denies any fever, chills or night sweats. She denies any diaphoresis with the above symptoms. She denied any palpitations. She denies any abdominal pain, nausea, or vomiting. She denies any lower extremity swelling or calf pain. Since admission to the ER, no further episodes were noted. Cardiology has been notified of the patient's admission, and is to consult this morning.   ED Course:  BP (!) 134/95   Pulse (!) 118   Temp 98.3 F (36.8 C) (Oral)   Resp (!) 26   Ht 5' (1.524 m)   Wt 77.6 kg (171 lb)   SpO2 91%   BMI 33.40 kg/m   Sodium 135, potassium 4.5, bicarbonate 26, glucose 163, BUNs 32, creatinine 1.31, GFR 37 Troponin 0.01, with repeat  less than 0.03     white count 9.1, hemoglobin 13, platelets 181    Sinus tachycardia Nonspecific IVCD with LAD Probable inferior infarct, age indeterminate Anterior infarct, old Baseline wander in  lead(s) V1 No significant change since last tracing Chest x-ray with NAD  Cardiology  consultation is pending  Review of Systems:  As per HPI otherwise all other systems reviewed and are negative  Past Medical History:  Diagnosis Date  . A-fib ([place])   . Acid reflux   . Anxiety   . Arthritis   . Cataract    surgery,bilateral  . Constipation   . Depression   . Diabetes mellitus without complication (HCC)    Pre diabetes  . Dyspnea on exertion    a. 07/2015 Echo: EF 55-60%, no rwma, mild MR, mod dil LA/RA/RV, mod TR, triv PR, PASP 72mmHg.  . Falls   . Family history of adverse reaction to anesthesia    mother always had n/v  . Hx: UTI (urinary tract infection)   . Hypercholesterolemia   . Hypertensive heart disease   . Insomnia   . Multifactorial gait disorder   . Non-obstructive CAD    a. 07/2015 Cath: LM 30-40d, LAD min irregs, LCX min irregs, RCA 30p, RA 17, RV 33/7, PA 42/19, PCWP 17.  . Osteoarthritis    a. 06/2015 s/p R total hip arthroplasty.  [name] PAF (paroxysmal atrial fibrillation) ([place])    a. Dx 07/2015, CHA2DS2VASc = 6-->Eliquis.  . Premature atrial contractions   . Pulmonary hypertension ([place])    a. 07/2015 Echo: PASP 69mmHg.  [name] Pyloric antral stenosis    in childhood,infancy  . Sleep apnea with use of continuous positive airway pressure (CPAP) 10/19/2012  a. compliant w/ CPAP.    Past Surgical History:  Procedure Laterality Date  . ABDOMINAL HYSTERECTOMY  1970  . ABDOMINAL SURGERY     as a 40 day old baby  . CARDIAC CATHETERIZATION N/A 07/14/2015   Procedure: Right/Left Heart Cath and Coronary Angiography;  Surgeon: Sanda Klein, MD;  Location: [facility];  Service: Cardiovascular;  Laterality: N/A;  . CARDIOVERSION N/A 11/04/2015   Procedure: CARDIOVERSION;  Surgeon: Lelon Perla, MD;  Location: [facility] ENDOSCOPY;  Service: Cardiovascular;  Laterality: N/A;  . CARDIOVERSION N/A 02/25/2016   Procedure: CARDIOVERSION;  Surgeon: Pixie Casino, MD;   Location: [facility] ENDOSCOPY;  Service: Cardiovascular;  Laterality: N/A;  . CARDIOVERSION N/A 12/13/2016   Procedure: CARDIOVERSION;  Surgeon: Fay Records, MD;  Location: [facility];  Service: Cardiovascular;  Laterality: N/A;  . COLONOSCOPY    . EYE SURGERY Bilateral    cataract surgery with lens implant  . INNER EAR SURGERY Left    x 3  . JOINT REPLACEMENT Right   . LAPAROSCOPIC SALPINGO OOPHERECTOMY    . NM MYOVIEW LTD  11/30/2007   normal stress nuclear study/EF-83%  . TONSILLECTOMY    . TOTAL HIP ARTHROPLASTY Right 06/26/2015   Procedure: RIGHT TOTAL HIP ARTHROPLASTY ANTERIOR APPROACH;  Surgeon: Leandrew Koyanagi, MD;  Location: [facility];  Service: Orthopedics;  Laterality: Right;  . TRANSTHORACIC ECHOCARDIOGRAM  07/23/2009   mild concentric LVH/mild mitral insufficiency/ moderate tricuspid innsufficiency/mild pulmonary HTN/EF- 55-60%  . VAGINAL DELIVERY      Social History Social History   Socioeconomic History  . Marital status: Widowed    Spouse name: Not on file  . Number of children: 1  . Years of education: 5  . Highest education level: Not on file  Social Needs  . Financial resource strain: Not on file  . Food insecurity - worry: Not on file  . Food insecurity - inability: Not on file  . Transportation needs - medical: Not on file  . Transportation needs - non-medical: Not on file  Occupational History  . Occupation: Retired  Tobacco Use  . Smoking status: Never Smoker  . Smokeless tobacco: Never Used  Substance and Sexual Activity  . Alcohol use: No    Alcohol/week: 0.0 oz    Comment: rare  . Drug use: No  . Sexual activity: Not on file  Other Topics Concern  . Not on file  Social History Narrative   Patient lives alone.    Patient is widowed.    Patient retired.    Patient some college.    Patient has one child.    Caffeine 1-2 cups daily avg.     Allergies  Allergen Reactions  . Lasix [Furosemide] Itching  . Phenobarbital Itching  . Amitiza  [Lubiprostone] Itching    Family History  Problem Relation Age of Onset  . Heart attack Father   . Heart failure Mother   . Cancer Mother        colon,kidney  . Diabetes Sister   . Hypertension Sister   . Heart failure Sister       Prior to Admission medications   Medication Sig Start Date End Date Taking? Authorizing Provider  acetaminophen (TYLENOL) 650 MG CR tablet Take 650-1,300 mg by mouth 2 (two) times daily as needed for pain (arthritis pain).    Yes [provider]  alum & mag hydroxide-simeth (MAALOX/MYLANTA) 200-200-20 MG/5ML suspension Take 15 mLs by mouth every 6 (six) hours as needed for indigestion  or heartburn.   Yes [provider]  amLODipine (NORVASC) 5 MG tablet Take 2.5 mg by mouth daily.    Yes [provider]  apixaban (ELIQUIS) 5 MG TABS tablet Take 1 tablet (5 mg total) by mouth 2 (two) times daily. 07/15/15  Yes Robbie Lis, MD  atorvastatin (LIPITOR) 40 MG tablet Take 40 mg by mouth daily.    Yes [provider]  Calcium Carbonate-Vitamin D3 (CALCIUM 600+D3) 600-400 MG-UNIT TABS Take 1 tablet by mouth daily.   Yes [provider]  cholecalciferol (VITAMIN D) 1000 UNITS tablet Take 1,000 Units by mouth daily.    Yes [provider]  desipramine (NORPRAMIN) 25 MG tablet Take 25 mg by mouth daily.   Yes [provider]  diclofenac sodium (VOLTAREN) 1 % GEL Apply 2 g topically 4 (four) times daily. 02/01/17  Yes Leandrew Koyanagi, MD  famotidine (PEPCID) 20 MG tablet Take 20 mg by mouth daily.   Yes [provider]  glucosamine-chondroitin 500-400 MG tablet Take 1 tablet by mouth every other day.    Yes [provider]  KLOR-CON 10 10 MEQ tablet TAKE 2 TABLETS (20 MEQ TOTAL) BY MOUTH DAILY. 11/25/16 02/23/17 Yes Sherran Needs, NP  linaclotide [facility]) 145 MCG CAPS capsule Take 145 mcg by mouth daily before breakfast.    Yes [provider]  LORazepam (ATIVAN) 0.5 MG tablet  Take 0.5 mg by mouth at bedtime.  12/26/15  Yes [provider]  metoprolol succinate (TOPROL-XL) 25 MG 24 hr tablet Take 0.5 tablets (12.5 mg total) by mouth at bedtime. Patient taking differently: Take 12.5-25 mg by mouth at bedtime. 25 mg in the morning. 12.5 mg at bedtime 12/07/16  Yes Sherran Needs, NP  Multiple Vitamins-Minerals (OCUVITE PO) Take 1 tablet by mouth daily.   Yes [provider]  Polyethyl Glycol-Propyl Glycol (SYSTANE) 0.4-0.3 % GEL ophthalmic gel Place 1 application into both eyes at bedtime as needed (dry eyes).   Yes [provider]  polyethylene glycol (MIRALAX / GLYCOLAX) packet Take 17 g by mouth daily. Mix in 8 oz liquid and drink   Yes [provider]  PRESCRIPTION MEDICATION Inhale into the lungs at bedtime. Uses CPAP with oxygen   Yes [provider]  sotalol (BETAPACE) 160 MG tablet TAKE 1 TABLET BY MOUTH IN THE MORNING 02/15/17  Yes Sherran Needs, NP  torsemide (DEMADEX) 10 MG tablet Take 1 tablet (10 mg total) by mouth daily. Patient taking differently: Take 20 mg by mouth every other day.  03/03/16  Yes Lavina Hamman, MD  vitamin B-12 (CYANOCOBALAMIN) 1000 MCG tablet Take 1,000 mcg by mouth daily.   Yes [provider]    Physical Exam:  Vitals:   02/23/17 0330 02/23/17 0400 02/23/17 0500 02/23/17 0530  BP: (!) 151/90 (!) 150/106 (!) 147/98 (!) 134/95  Pulse: (!) 114 (!) 114 (!) 118 (!) 118  Resp: 19 15 (!) 24 (!) 26  Temp:      TempSrc:      SpO2: 94% 95% 95% 91%  Weight:      Height:       Constitutional: NAD, calm, comfortable  Eyes: PERRL, lids and conjunctivae normal ENMT: Mucous membranes are moist, without exudate or lesions  Neck: normal, supple, no masses, no thyromegaly Respiratory: clear to auscultation bilaterally, no wheezing, no crackles. Normal respiratory effort  Cardiovascular: Irregularly irregular and tachy  rate and rhythm, no murmur, rubs or gallops. No extremity edema.  2+ pedal pulses. No carotid bruits.  Abdomen: Soft, mod obese non tender, No hepatosplenomegaly. Bowel sounds positive.  Musculoskeletal: no clubbing / cyanosis. Moves all extremities Skin: no jaundice, No lesions.  Neurologic: Sensation intact  Strength equal in all extremities Psychiatric:   Alert and oriented x 3. Normal mood.     Labs on Admission: I have personally reviewed following labs and imaging studies  CBC: Recent Labs  Lab 02/23/17 0138  WBC 9.1  HGB 13.0  HCT 38.4  MCV 94.3  PLT 967    Basic Metabolic Panel: Recent Labs  Lab 02/23/17 0138  NA 135  K 4.5  CL 103  CO2 26  GLUCOSE 163*  BUN 32*  CREATININE 1.31*  CALCIUM 9.2    GFR: Estimated Creatinine Clearance: 29.9 mL/min (A) (by C-G formula based on SCr of 1.31 mg/dL (H)).  Liver Function Tests: No results for input(s): AST, ALT, ALKPHOS, BILITOT, PROT, ALBUMIN in the last 168 hours. No results for input(s): LIPASE, AMYLASE in the last 168 hours. No results for input(s): AMMONIA in the last 168 hours.  Coagulation Profile: No results for input(s): INR, PROTIME in the last 168 hours.  Cardiac Enzymes: Recent Labs  Lab 02/23/17 0507  TROPONINI <0.03    BNP (last 3 results) No results for input(s): PROBNP in the last 8760 hours.  HbA1C: No results for input(s): HGBA1C in the last 72 hours.  CBG: No results for input(s): GLUCAP in the last 168 hours.  Lipid Profile: No results for input(s): CHOL, HDL, LDLCALC, TRIG, CHOLHDL, LDLDIRECT in the last 72 hours.  Thyroid Function Tests: No results for input(s): TSH, T4TOTAL, FREET4, T3FREE, THYROIDAB in the last 72 hours.  Anemia Panel: No results for input(s): VITAMINB12, FOLATE, FERRITIN, TIBC, IRON, RETICCTPCT in the last 72 hours.  Urine analysis:    Component Value Date/Time   COLORURINE YELLOW 09/04/2016 1222   APPEARANCEUR CLOUDY (A) 09/04/2016 1222   LABSPEC 1.012 09/04/2016 1222   PHURINE 6.0 09/04/2016 1222   GLUCOSEU  NEGATIVE 09/04/2016 1222   HGBUR NEGATIVE 09/04/2016 1222   BILIRUBINUR NEGATIVE 09/04/2016 1222   KETONESUR NEGATIVE 09/04/2016 1222   PROTEINUR 30 (A) 09/04/2016 1222   NITRITE NEGATIVE 09/04/2016 1222   LEUKOCYTESUR LARGE (A) 09/04/2016 1222    Sepsis Labs: @LABRCNTIP (procalcitonin:4,lacticidven:4) )No results found for this or any previous visit (from the past 240 hour(s)).   Radiological Exams on Admission: Dg Chest 2 View  Result Date: 02/23/2017 CLINICAL DATA:  Chest pain EXAM: CHEST  2 VIEW COMPARISON:  Chest radiograph 09/04/2016 FINDINGS: Mild cardiomegaly. Unchanged elevation of the right hemidiaphragm. No focal airspace consolidation or pulmonary edema. No pleural effusion or pneumothorax. IMPRESSION: No active cardiopulmonary disease. Electronically Signed   By: Ulyses Jarred M.D.   On: 02/23/2017 01:23    EKG: Independently reviewed.  Assessment/Plan Active Problems:   Chest pain with moderate risk for cardiac etiology   Multifactorial gait disorder   Sleep apnea with use of continuous positive airway pressure (CPAP)   Adjustment disorder with mixed anxiety and depressed mood   OSA on CPAP   Ataxia   Falls   Osteoarthritis of right hip   Essential hypertension   Pulmonary arterial hypertension (HCC)   Hypercholesterolemia   Persistent atrial fibrillation (HCC)   Chest pain   GERD (gastroesophageal reflux disease)   Diabetes ([place])   Chest pain syndrome/known CAD  HEART score 4 . Troponin neg , EKG without evidence of ACS. CP relieved with  aspirin. CXR unrevealing.  Admit to Telemetry/ Observation Chest pain order set Cycle troponins EKG in am continue ASA, O2 and NTG as needed Check Lipid panel  Hb A1C Consult to Cards, appreciate theitr involvement  Atrial Fibrillation s/p DCCV in Dec 2017  CHA2DS2-VASc score 4-5, on anticoagulation with Eliquis . Last 2-D echo 03/02/2016, with EF 49-44%, normal systolic on the left ventricle. Continue meds  including Sotalol, BB, including Eliquis    History of pulmonary hypertension, with negative CT of the chest as outpatient follow by cardiology   Chronic kidney disease stage  3  baseline creatinine 1.3 stable     Lab Results  Component Value Date   CREATININE 1.31 (H) 02/23/2017   CREATININE 0.95 11/30/2016   CREATININE 1.33 (H) 09/04/2016  IVF Repeat CMET in am    Hypertension BP  134/95   Pulse 118  Has not taken her morning meds yet  Resume  home anti-hypertensive medications  Add Hydralazine Q6 hours as needed for BP 160/90    Type II Diabetes Current blood sugar level is 161 No results found for: HGBA1C Hgb A1C Hold home oral diabetic medications.  SSI  Hyperlipidemia Continue home statins   Chronic constipation  Continue Linzess   GERD, no acute symptoms Continue PPI  Anxiety/ depression Continue home Ativan and Desipramine  Chronic gait disorder/Osteoarthritis/ deconditioning PT/OT Continue Voltaren  OSA Continue CPAP nightly    DVT prophylaxis:  Eliquis  Code Status:    Full  Family Communication:  Discussed with patient Disposition Plan: Expect patient to be discharged to home after condition improves Consults called:    Cardiology per EDP  Admission status: Tele Obs    Sharene Butters, PA-C [facility]   02/23/2017, 6:40 AM

## 2017-02-23 NOTE — Telephone Encounter (Signed)
Pt reported to ED yesterday evening

## 2017-02-23 NOTE — Plan of Care (Signed)
Discussed patient care with Dr. Christy Gentles.  Ms. Melinda [name] is 83 year old female with pmh of PAF requiring cardioversion x3 in the last 1 year, but nonobstructive CAD, HTN, HLD, CHF last EF 60-65% in 02/2016; presents with acute onset of chest pain and shortness of breath.  Patient took 325 mg of aspirin PTA of EMS. CXR clear.  Patient is currently chest pain-free.  EKG shows sinus tachycardia without significant ischemic changes labs revealed BUN 32, creatinine 1.31, and troponin i 0.01.  TRH called to admit.  Accepted as observation to a telemetry bed.  Trending troponin and gentle IV fluids of normal saline at 75 mL/h.

## 2017-02-23 NOTE — Telephone Encounter (Signed)
Patient is currently in the ER. 

## 2017-02-23 NOTE — H&P (View-Only) (Signed)
History and Physical    Melinda [name] OIZ:[id number] DOB: 23-Jan-1932 DOA: 02/23/2017   PCP: Crist Infante, MD   Patient coming from:  Home    Chief Complaint: Chest pain   HPI: Melinda [name] is a 83 y.o. female with medical history significant for diabetes, GERD, CAD, atrial fibrillation, anxiety, osteoarthritis,osteoarthritis, brought to the ER with severe substernal chest pain radiating to the jaw, acute, starting around 10 PM, lasting for about one hour. She reports having similar episodes over the last 6 months, when the symptoms appeared to bedtime. She took a baby aspirin, and the symptoms improved. She did not receive nitroglycerin in her morphine in transport. She denies any new changes in her medications and she is compliant with him. She denies any pleuritic chest pain. She has been feeling increasingly weak, as well as as experiencing dyspnea on exertion in the setting of deconditioning. Patient denies any new herbal products, or hormonal meds. She denies any new stressors. No recent long distance trips. No recent infections. She denies any fever, chills or night sweats. She denies any diaphoresis with the above symptoms. She denied any palpitations. She denies any abdominal pain, nausea, or vomiting. She denies any lower extremity swelling or calf pain. Since admission to the ER, no further episodes were noted. Cardiology has been notified of the patient's admission, and is to consult this morning.   ED Course:  BP (!) 134/95   Pulse (!) 118   Temp 98.3 F (36.8 C) (Oral)   Resp (!) 26   Ht 5' (1.524 m)   Wt 77.6 kg (171 lb)   SpO2 91%   BMI 33.40 kg/m   Sodium 135, potassium 4.5, bicarbonate 26, glucose 163, BUNs 32, creatinine 1.31, GFR 37 Troponin 0.01, with repeat  less than 0.03     white count 9.1, hemoglobin 13, platelets 181    Sinus tachycardia Nonspecific IVCD with LAD Probable inferior infarct, age indeterminate Anterior infarct, old Baseline wander in  lead(s) V1 No significant change since last tracing Chest x-ray with NAD  Cardiology  consultation is pending  Review of Systems:  As per HPI otherwise all other systems reviewed and are negative  Past Medical History:  Diagnosis Date  . A-fib ([place])   . Acid reflux   . Anxiety   . Arthritis   . Cataract    surgery,bilateral  . Constipation   . Depression   . Diabetes mellitus without complication (HCC)    Pre diabetes  . Dyspnea on exertion    a. 07/2015 Echo: EF 55-60%, no rwma, mild MR, mod dil LA/RA/RV, mod TR, triv PR, PASP 44mmHg.  . Falls   . Family history of adverse reaction to anesthesia    mother always had n/v  . Hx: UTI (urinary tract infection)   . Hypercholesterolemia   . Hypertensive heart disease   . Insomnia   . Multifactorial gait disorder   . Non-obstructive CAD    a. 07/2015 Cath: LM 30-40d, LAD min irregs, LCX min irregs, RCA 30p, RA 17, RV 33/7, PA 42/19, PCWP 17.  . Osteoarthritis    a. 06/2015 s/p R total hip arthroplasty.  [name] PAF (paroxysmal atrial fibrillation) ([place])    a. Dx 07/2015, CHA2DS2VASc = 6-->Eliquis.  . Premature atrial contractions   . Pulmonary hypertension ([place])    a. 07/2015 Echo: PASP 49mmHg.  [name] Pyloric antral stenosis    in childhood,infancy  . Sleep apnea with use of continuous positive airway pressure (CPAP) 10/19/2012  a. compliant w/ CPAP.    Past Surgical History:  Procedure Laterality Date  . ABDOMINAL HYSTERECTOMY  1970  . ABDOMINAL SURGERY     as a 69 day old baby  . CARDIAC CATHETERIZATION N/A 07/14/2015   Procedure: Right/Left Heart Cath and Coronary Angiography;  Surgeon: Sanda Klein, MD;  Location: [place] [facility];  Service: Cardiovascular;  Laterality: N/A;  . CARDIOVERSION N/A 11/04/2015   Procedure: CARDIOVERSION;  Surgeon: Lelon Perla, MD;  Location: [facility] ENDOSCOPY;  Service: Cardiovascular;  Laterality: N/A;  . CARDIOVERSION N/A 02/25/2016   Procedure: CARDIOVERSION;  Surgeon: Pixie Casino, MD;   Location: [facility] ENDOSCOPY;  Service: Cardiovascular;  Laterality: N/A;  . CARDIOVERSION N/A 12/13/2016   Procedure: CARDIOVERSION;  Surgeon: Fay Records, MD;  Location: [facility];  Service: Cardiovascular;  Laterality: N/A;  . COLONOSCOPY    . EYE SURGERY Bilateral    cataract surgery with lens implant  . INNER EAR SURGERY Left    x 3  . JOINT REPLACEMENT Right   . LAPAROSCOPIC SALPINGO OOPHERECTOMY    . NM MYOVIEW LTD  11/30/2007   normal stress nuclear study/EF-83%  . TONSILLECTOMY    . TOTAL HIP ARTHROPLASTY Right 06/26/2015   Procedure: RIGHT TOTAL HIP ARTHROPLASTY ANTERIOR APPROACH;  Surgeon: Leandrew Koyanagi, MD;  Location: [place];  Service: Orthopedics;  Laterality: Right;  . TRANSTHORACIC ECHOCARDIOGRAM  07/23/2009   mild concentric LVH/mild mitral insufficiency/ moderate tricuspid innsufficiency/mild pulmonary HTN/EF- 55-60%  . VAGINAL DELIVERY      Social History Social History   Socioeconomic History  . Marital status: Widowed    Spouse name: Not on file  . Number of children: 1  . Years of education: 77  . Highest education level: Not on file  Social Needs  . Financial resource strain: Not on file  . Food insecurity - worry: Not on file  . Food insecurity - inability: Not on file  . Transportation needs - medical: Not on file  . Transportation needs - non-medical: Not on file  Occupational History  . Occupation: Retired  Tobacco Use  . Smoking status: Never Smoker  . Smokeless tobacco: Never Used  Substance and Sexual Activity  . Alcohol use: No    Alcohol/week: 0.0 oz    Comment: rare  . Drug use: No  . Sexual activity: Not on file  Other Topics Concern  . Not on file  Social History Narrative   Patient lives alone.    Patient is widowed.    Patient retired.    Patient some college.    Patient has one child.    Caffeine 1-2 cups daily avg.     Allergies  Allergen Reactions  . Lasix [Furosemide] Itching  . Phenobarbital Itching  . Amitiza  [Lubiprostone] Itching    Family History  Problem Relation Age of Onset  . Heart attack Father   . Heart failure Mother   . Cancer Mother        colon,kidney  . Diabetes Sister   . Hypertension Sister   . Heart failure Sister       Prior to Admission medications   Medication Sig Start Date End Date Taking? Authorizing Provider  acetaminophen (TYLENOL) 650 MG CR tablet Take 650-1,300 mg by mouth 2 (two) times daily as needed for pain (arthritis pain).    Yes [provider]  alum & mag hydroxide-simeth (MAALOX/MYLANTA) 200-200-20 MG/5ML suspension Take 15 mLs by mouth every 6 (six) hours as needed for indigestion  or heartburn.   Yes [provider]  amLODipine (NORVASC) 5 MG tablet Take 2.5 mg by mouth daily.    Yes [provider]  apixaban (ELIQUIS) 5 MG TABS tablet Take 1 tablet (5 mg total) by mouth 2 (two) times daily. 07/15/15  Yes Robbie Lis, MD  atorvastatin (LIPITOR) 40 MG tablet Take 40 mg by mouth daily.    Yes [provider]  Calcium Carbonate-Vitamin D3 (CALCIUM 600+D3) 600-400 MG-UNIT TABS Take 1 tablet by mouth daily.   Yes [provider]  cholecalciferol (VITAMIN D) 1000 UNITS tablet Take 1,000 Units by mouth daily.    Yes [provider]  desipramine (NORPRAMIN) 25 MG tablet Take 25 mg by mouth daily.   Yes [provider]  diclofenac sodium (VOLTAREN) 1 % GEL Apply 2 g topically 4 (four) times daily. 02/01/17  Yes Leandrew Koyanagi, MD  famotidine (PEPCID) 20 MG tablet Take 20 mg by mouth daily.   Yes [provider]  glucosamine-chondroitin 500-400 MG tablet Take 1 tablet by mouth every other day.    Yes [provider]  KLOR-CON 10 10 MEQ tablet TAKE 2 TABLETS (20 MEQ TOTAL) BY MOUTH DAILY. 11/25/16 02/23/17 Yes Sherran Needs, NP  linaclotide [facility]) 145 MCG CAPS capsule Take 145 mcg by mouth daily before breakfast.    Yes [provider]  LORazepam (ATIVAN) 0.5 MG tablet  Take 0.5 mg by mouth at bedtime.  12/26/15  Yes [provider]  metoprolol succinate (TOPROL-XL) 25 MG 24 hr tablet Take 0.5 tablets (12.5 mg total) by mouth at bedtime. Patient taking differently: Take 12.5-25 mg by mouth at bedtime. 25 mg in the morning. 12.5 mg at bedtime 12/07/16  Yes Sherran Needs, NP  Multiple Vitamins-Minerals (OCUVITE PO) Take 1 tablet by mouth daily.   Yes [provider]  Polyethyl Glycol-Propyl Glycol (SYSTANE) 0.4-0.3 % GEL ophthalmic gel Place 1 application into both eyes at bedtime as needed (dry eyes).   Yes [provider]  polyethylene glycol (MIRALAX / GLYCOLAX) packet Take 17 g by mouth daily. Mix in 8 oz liquid and drink   Yes [provider]  PRESCRIPTION MEDICATION Inhale into the lungs at bedtime. Uses CPAP with oxygen   Yes [provider]  sotalol (BETAPACE) 160 MG tablet TAKE 1 TABLET BY MOUTH IN THE MORNING 02/15/17  Yes Sherran Needs, NP  torsemide (DEMADEX) 10 MG tablet Take 1 tablet (10 mg total) by mouth daily. Patient taking differently: Take 20 mg by mouth every other day.  03/03/16  Yes Lavina Hamman, MD  vitamin B-12 (CYANOCOBALAMIN) 1000 MCG tablet Take 1,000 mcg by mouth daily.   Yes [provider]    Physical Exam:  Vitals:   02/23/17 0330 02/23/17 0400 02/23/17 0500 02/23/17 0530  BP: (!) 151/90 (!) 150/106 (!) 147/98 (!) 134/95  Pulse: (!) 114 (!) 114 (!) 118 (!) 118  Resp: 19 15 (!) 24 (!) 26  Temp:      TempSrc:      SpO2: 94% 95% 95% 91%  Weight:      Height:       Constitutional: NAD, calm, comfortable  Eyes: PERRL, lids and conjunctivae normal ENMT: Mucous membranes are moist, without exudate or lesions  Neck: normal, supple, no masses, no thyromegaly Respiratory: clear to auscultation bilaterally, no wheezing, no crackles. Normal respiratory effort  Cardiovascular: Irregularly irregular and tachy  rate and rhythm, no murmur, rubs or gallops. No extremity edema.  2+ pedal pulses. No carotid bruits.  Abdomen: Soft, mod obese non tender, No hepatosplenomegaly. Bowel sounds positive.  Musculoskeletal: no clubbing / cyanosis. Moves all extremities Skin: no jaundice, No lesions.  Neurologic: Sensation intact  Strength equal in all extremities Psychiatric:   Alert and oriented x 3. Normal mood.     Labs on Admission: I have personally reviewed following labs and imaging studies  CBC: Recent Labs  Lab 02/23/17 0138  WBC 9.1  HGB 13.0  HCT 38.4  MCV 94.3  PLT 478    Basic Metabolic Panel: Recent Labs  Lab 02/23/17 0138  NA 135  K 4.5  CL 103  CO2 26  GLUCOSE 163*  BUN 32*  CREATININE 1.31*  CALCIUM 9.2    GFR: Estimated Creatinine Clearance: 29.9 mL/min (A) (by C-G formula based on SCr of 1.31 mg/dL (H)).  Liver Function Tests: No results for input(s): AST, ALT, ALKPHOS, BILITOT, PROT, ALBUMIN in the last 168 hours. No results for input(s): LIPASE, AMYLASE in the last 168 hours. No results for input(s): AMMONIA in the last 168 hours.  Coagulation Profile: No results for input(s): INR, PROTIME in the last 168 hours.  Cardiac Enzymes: Recent Labs  Lab 02/23/17 0507  TROPONINI <0.03    BNP (last 3 results) No results for input(s): PROBNP in the last 8760 hours.  HbA1C: No results for input(s): HGBA1C in the last 72 hours.  CBG: No results for input(s): GLUCAP in the last 168 hours.  Lipid Profile: No results for input(s): CHOL, HDL, LDLCALC, TRIG, CHOLHDL, LDLDIRECT in the last 72 hours.  Thyroid Function Tests: No results for input(s): TSH, T4TOTAL, FREET4, T3FREE, THYROIDAB in the last 72 hours.  Anemia Panel: No results for input(s): VITAMINB12, FOLATE, FERRITIN, TIBC, IRON, RETICCTPCT in the last 72 hours.  Urine analysis:    Component Value Date/Time   COLORURINE YELLOW 09/04/2016 1222   APPEARANCEUR CLOUDY (A) 09/04/2016 1222   LABSPEC 1.012 09/04/2016 1222   PHURINE 6.0 09/04/2016 1222   GLUCOSEU  NEGATIVE 09/04/2016 1222   HGBUR NEGATIVE 09/04/2016 1222   BILIRUBINUR NEGATIVE 09/04/2016 1222   KETONESUR NEGATIVE 09/04/2016 1222   PROTEINUR 30 (A) 09/04/2016 1222   NITRITE NEGATIVE 09/04/2016 1222   LEUKOCYTESUR LARGE (A) 09/04/2016 1222    Sepsis Labs: @LABRCNTIP (procalcitonin:4,lacticidven:4) )No results found for this or any previous visit (from the past 240 hour(s)).   Radiological Exams on Admission: Dg Chest 2 View  Result Date: 02/23/2017 CLINICAL DATA:  Chest pain EXAM: CHEST  2 VIEW COMPARISON:  Chest radiograph 09/04/2016 FINDINGS: Mild cardiomegaly. Unchanged elevation of the right hemidiaphragm. No focal airspace consolidation or pulmonary edema. No pleural effusion or pneumothorax. IMPRESSION: No active cardiopulmonary disease. Electronically Signed   By: Ulyses Jarred M.D.   On: 02/23/2017 01:23    EKG: Independently reviewed.  Assessment/Plan Active Problems:   Chest pain with moderate risk for cardiac etiology   Multifactorial gait disorder   Sleep apnea with use of continuous positive airway pressure (CPAP)   Adjustment disorder with mixed anxiety and depressed mood   OSA on CPAP   Ataxia   Falls   Osteoarthritis of right hip   Essential hypertension   Pulmonary arterial hypertension (HCC)   Hypercholesterolemia   Persistent atrial fibrillation (HCC)   Chest pain   GERD (gastroesophageal reflux disease)   Diabetes ([employer])   Chest pain syndrome/known CAD  HEART score 4 . Troponin neg , EKG without evidence of ACS. CP relieved with  aspirin. CXR unrevealing.  Admit to Telemetry/ Observation Chest pain order set Cycle troponins EKG in am continue ASA, O2 and NTG as needed Check Lipid panel  Hb A1C Consult to Cards, appreciate theitr involvement  Atrial Fibrillation s/p DCCV in Dec 2017  CHA2DS2-VASc score 4-5, on anticoagulation with Eliquis . Last 2-D echo 03/02/2016, with EF 12-45%, normal systolic on the left ventricle. Continue meds  including Sotalol, BB, including Eliquis    History of pulmonary hypertension, with negative CT of the chest as outpatient follow by cardiology   Chronic kidney disease stage  3  baseline creatinine 1.3 stable     Lab Results  Component Value Date   CREATININE 1.31 (H) 02/23/2017   CREATININE 0.95 11/30/2016   CREATININE 1.33 (H) 09/04/2016  IVF Repeat CMET in am    Hypertension BP  134/95   Pulse 118  Has not taken her morning meds yet  Resume  home anti-hypertensive medications  Add Hydralazine Q6 hours as needed for BP 160/90    Type II Diabetes Current blood sugar level is 161 No results found for: HGBA1C Hgb A1C Hold home oral diabetic medications.  SSI  Hyperlipidemia Continue home statins   Chronic constipation  Continue Linzess   GERD, no acute symptoms Continue PPI  Anxiety/ depression Continue home Ativan and Desipramine  Chronic gait disorder/Osteoarthritis/ deconditioning PT/OT Continue Voltaren  OSA Continue CPAP nightly    DVT prophylaxis:  Eliquis  Code Status:    Full  Family Communication:  Discussed with patient Disposition Plan: Expect patient to be discharged to home after condition improves Consults called:    Cardiology per EDP  Admission status: Tele Obs    Sharene Butters, PA-C [facility]   02/23/2017, 6:40 AM

## 2017-02-23 NOTE — ED Notes (Signed)
Patient transported to X-ray 

## 2017-02-23 NOTE — Consult Note (Signed)
Cardiology Consultation:   Patient ID: SKARLETTE LATTNER; [id number]; [birth year]   Admit date: 02/23/2017 Date of Consult: 02/23/2017  Primary Care Provider: Crist Infante, MD Primary Cardiologist: Dr. Martinique Primary Electrophysiologist: Dr. Lovena Le  Patient Profile:   KAYLIE RITTER is a 83 y.o. female with a hx of atrial fibrillation status post multiple DCCV cardioversions, DM, nonobstructive CAD per cath 07/2015, osteoarthritis, and GERD who is being seen today for the evaluation of chest pain at the request of Dr. Marthenia Rolling.   History of Present Illness:   Ms. Lucia is an 83 year old female who presents to the emergency department with mild substernal chest pain which radiated to the right and left jaw. Her chest pain began around 10 PM yesterday while laying in bed (02/22/17) and lasted for approximately 1 hour. She called EMS and was transported to the emergency department. She received 324mg  ASA with relief en route. She rated her pain at a 4/10 in severity, which subsided to a 1/10 with the ASA. She says that she has been having these episodes more frequently over the last several months. Prior that this, she had one single episode years ago.   She does report increased dyspnea on exertion and increasing weakness, however this has been ongoing and does not seem to be getting worse. She denies nausea, vomiting, fevers, or chills. No reports of palpitations, numbness, or tingling in her hands or fingers, or no swelling in her lower extremities.   She has an extensive history of A. fib with multiple cardioversions over the last several years.   In 2009 she had a normal stress Myoview study and echocardiogram which showed mild LVH with normal EF and mild pulmonary hypertension. Early May 2017 she presented with symptoms of increased dyspnea on exertion. A repeat echocardiogram and Myoview study were ordered, however the myoview was never completed due to an acute chest pain  admission to the hospital May 7- Jul 15, 2015 where she was found to be in a flutter with a controlled rate.    A coronary ischemia workup was completed and she ruled out for an MI at this time. Her echo at this time showed a normal LV function with mild LVH, mild MR, moderate LA and RA and RV enlargement with pulmonary hypertension estimated at 79mmHg. She underwent a right and left heart cath (07/14/2015) which showed nonobstructive CAD and mild pulmonary hypertension. She had a chest CT which was normal with no evidence of PE. She was followed by Dr. Brett Fairy for her OSA/CPAP and subsequent PFTs were performed as an outpatient. These showed normal volumes and mild obstruction. She was started on diuretic therapy, anticoagulated and underwent successful DCCV on November 04, 2015.  She was then seen November 2017 for recurrent A. Fib in the office.  At this time her beta-blocker was increased and she had a subsequent repeat DCCV on February 25, 2016. She was admitted 4 days later with hypoxia and respiratory distress questionably related to left lower lobe pneumonia. Her echo was normal at that time.   On 03/11/2016, she was seen back in the emergency department for recurrent A. fib which was rate controlled. She was set up to be followed in the A. fib clinic and was not found to be a Tikosyn candidate due to cost. Due to the fact that she is significantly symptomatic with her A. Fib, she was admitted in March 2018 for initiation of sotalol therapy and DC'd on 160 mg twice daily. She remains on  Eliquis.  In September 2018 she was seen in the office with symptoms of tachycardia and fatigue. She was found to be in a flutter. Her beta-blocker was increased with no improvement and underwent another DCCV cardioversion on 12/13/2016 successfully.  She was last seen by Dr. Martinique on 12/20/2016 in which she has been keeping a diary of her blood pressure and heart rate since her cardioversion and states that her heart  rate has stayed down.  In the ED, her troponin levels have been flat: 0.01>0.03. CXR showed mild cardiomegaly and unchanged elevation of the right hemidiaphragm. EKG with no ischemic changes. Her Sotalol is currently on hold due to increased QTc of 542 per IM and pharmacy.    Past Medical History:  Diagnosis Date  . A-fib ([place])   . Acid reflux   . Anxiety   . Arthritis   . Cataract    surgery,bilateral  . Constipation   . Depression   . Diabetes mellitus without complication (HCC)    Pre diabetes  . Dyspnea on exertion    a. 07/2015 Echo: EF 55-60%, no rwma, mild MR, mod dil LA/RA/RV, mod TR, triv PR, PASP 57mmHg.  . Falls   . Family history of adverse reaction to anesthesia    mother always had n/v  . Hx: UTI (urinary tract infection)   . Hypercholesterolemia   . Hypertensive heart disease   . Insomnia   . Multifactorial gait disorder   . Non-obstructive CAD    a. 07/2015 Cath: LM 30-40d, LAD min irregs, LCX min irregs, RCA 30p, RA 17, RV 33/7, PA 42/19, PCWP 17.  . Osteoarthritis    a. 06/2015 s/p R total hip arthroplasty.  [name] PAF (paroxysmal atrial fibrillation) ([place])    a. Dx 07/2015, CHA2DS2VASc = 6-->Eliquis.  . Premature atrial contractions   . Pulmonary hypertension ([place])    a. 07/2015 Echo: PASP 23mmHg.  [name] Pyloric antral stenosis    in childhood,infancy  . Sleep apnea with use of continuous positive airway pressure (CPAP) 10/19/2012   a. compliant w/ CPAP.    Past Surgical History:  Procedure Laterality Date  . ABDOMINAL HYSTERECTOMY  1970  . ABDOMINAL SURGERY     as a 51 day old baby  . CARDIAC CATHETERIZATION N/A 07/14/2015   Procedure: Right/Left Heart Cath and Coronary Angiography;  Surgeon: Sanda Klein, MD;  Location: [place] [facility];  Service: Cardiovascular;  Laterality: N/A;  . CARDIOVERSION N/A 11/04/2015   Procedure: CARDIOVERSION;  Surgeon: Lelon Perla, MD;  Location: [facility] ENDOSCOPY;  Service: Cardiovascular;  Laterality: N/A;  . CARDIOVERSION N/A  02/25/2016   Procedure: CARDIOVERSION;  Surgeon: Pixie Casino, MD;  Location: [facility] ENDOSCOPY;  Service: Cardiovascular;  Laterality: N/A;  . CARDIOVERSION N/A 12/13/2016   Procedure: CARDIOVERSION;  Surgeon: Fay Records, MD;  Location: [place];  Service: Cardiovascular;  Laterality: N/A;  . COLONOSCOPY    . EYE SURGERY Bilateral    cataract surgery with lens implant  . INNER EAR SURGERY Left    x 3  . JOINT REPLACEMENT Right   . LAPAROSCOPIC SALPINGO OOPHERECTOMY    . NM MYOVIEW LTD  11/30/2007   normal stress nuclear study/EF-83%  . TONSILLECTOMY    . TOTAL HIP ARTHROPLASTY Right 06/26/2015   Procedure: RIGHT TOTAL HIP ARTHROPLASTY ANTERIOR APPROACH;  Surgeon: Leandrew Koyanagi, MD;  Location: [place];  Service: Orthopedics;  Laterality: Right;  . TRANSTHORACIC ECHOCARDIOGRAM  07/23/2009   mild concentric LVH/mild mitral insufficiency/ moderate tricuspid innsufficiency/mild  pulmonary HTN/EF- 55-60%  . VAGINAL DELIVERY       Prior to Admission medications   Medication Sig Start Date End Date Taking? Authorizing Provider  acetaminophen (TYLENOL) 650 MG CR tablet Take 650-1,300 mg by mouth 2 (two) times daily as needed for pain (arthritis pain).    Yes [provider]  alum & mag hydroxide-simeth (MAALOX/MYLANTA) 200-200-20 MG/5ML suspension Take 15 mLs by mouth every 6 (six) hours as needed for indigestion or heartburn.   Yes [provider]  amLODipine (NORVASC) 5 MG tablet Take 2.5 mg by mouth daily.    Yes [provider]  apixaban (ELIQUIS) 5 MG TABS tablet Take 1 tablet (5 mg total) by mouth 2 (two) times daily. 07/15/15  Yes Robbie Lis, MD  atorvastatin (LIPITOR) 40 MG tablet Take 40 mg by mouth daily.    Yes [provider]  Calcium Carbonate-Vitamin D3 (CALCIUM 600+D3) 600-400 MG-UNIT TABS Take 1 tablet by mouth daily.   Yes [provider]  cholecalciferol (VITAMIN D) 1000 UNITS tablet Take 1,000 Units by mouth daily.    Yes [provider]  desipramine (NORPRAMIN) 25 MG tablet Take 25 mg by mouth daily.   Yes [provider]  diclofenac sodium (VOLTAREN) 1 % GEL Apply 2 g topically 4 (four) times daily. 02/01/17  Yes Leandrew Koyanagi, MD  famotidine (PEPCID) 20 MG tablet Take 20 mg by mouth daily.   Yes [provider]  glucosamine-chondroitin 500-400 MG tablet Take 1 tablet by mouth every other day.    Yes [provider]  KLOR-CON 10 10 MEQ tablet TAKE 2 TABLETS (20 MEQ TOTAL) BY MOUTH DAILY. 11/25/16 02/23/17 Yes Sherran Needs, NP  linaclotide [facility]) 145 MCG CAPS capsule Take 145 mcg by mouth daily before breakfast.    Yes [provider]  LORazepam (ATIVAN) 0.5 MG tablet Take 0.5 mg by mouth at bedtime.  12/26/15  Yes [provider]  metoprolol succinate (TOPROL-XL) 25 MG 24 hr tablet Take 0.5 tablets (12.5 mg total) by mouth at bedtime. Patient taking differently: Take 12.5-25 mg by mouth at bedtime. 25 mg in the morning. 12.5 mg at bedtime 12/07/16  Yes Sherran Needs, NP  Multiple Vitamins-Minerals (OCUVITE PO) Take 1 tablet by mouth daily.   Yes [provider]  Polyethyl Glycol-Propyl Glycol (SYSTANE) 0.4-0.3 % GEL ophthalmic gel Place 1 application into both eyes at bedtime as needed (dry eyes).   Yes [provider]  polyethylene glycol (MIRALAX / GLYCOLAX) packet Take 17 g by mouth daily. Mix in 8 oz liquid and drink   Yes [provider]  PRESCRIPTION MEDICATION Inhale into the lungs at bedtime. Uses CPAP with oxygen   Yes [provider]  sotalol (BETAPACE) 160 MG tablet TAKE 1 TABLET BY MOUTH IN THE MORNING 02/15/17  Yes Sherran Needs, NP  torsemide (DEMADEX) 10 MG tablet Take 1 tablet (10 mg total) by mouth daily. Patient taking differently: Take 20 mg by mouth every other day.  03/03/16  Yes Lavina Hamman, MD  vitamin B-12 (CYANOCOBALAMIN) 1000 MCG tablet Take 1,000 mcg by mouth daily.   Yes [provider]    Inpatient Medications: Scheduled Meds: . amLODipine  2.5 mg Oral Daily  . apixaban  5 mg Oral BID  . aspirin  325 mg Oral Daily  . atorvastatin  40 mg Oral Daily  . calcium-vitamin D  1 tablet Oral Q breakfast  . cholecalciferol  1,000 Units Oral  Daily  . desipramine  25 mg Oral Daily  . diclofenac sodium  2 g Topical QID  . famotidine  20 mg Oral Daily  . insulin aspart  0-9 Units Subcutaneous TID WC  . linaclotide  145 mcg Oral QAC breakfast  . LORazepam  0.5 mg Oral QHS  . metoprolol succinate  12.5 mg Oral QHS  . potassium chloride  20 mEq Oral Daily  . sotalol  160 mg Oral q morning - 10a  . torsemide  10 mg Oral Daily  . vitamin B-12  1,000 mcg Oral Daily   Continuous Infusions: . sodium chloride 75 mL/hr at 02/23/17 0440   PRN Meds: alum & mag hydroxide-simeth, morphine injection, polyvinyl alcohol  Allergies:    Allergies  Allergen Reactions  . Lasix [Furosemide] Itching  . Phenobarbital Itching  . Amitiza [Lubiprostone] Itching    Social History:   Social History   Socioeconomic History  . Marital status: Widowed    Spouse name: Not on file  . Number of children: 1  . Years of education: 36  . Highest education level: Not on file  Social Needs  . Financial resource strain: Not on file  . Food insecurity - worry: Not on file  . Food insecurity - inability: Not on file  . Transportation needs - medical: Not on file  . Transportation needs - non-medical: Not on file  Occupational History  . Occupation: Retired  Tobacco Use  . Smoking status: Never Smoker  . Smokeless tobacco: Never Used  Substance and Sexual Activity  . Alcohol use: No    Alcohol/week: 0.0 oz    Comment: rare  . Drug use: No  . Sexual activity: Not on file  Other Topics Concern  . Not on file  Social History Narrative   Patient lives alone.    Patient is widowed.    Patient retired.    Patient some college.    Patient has one child.    Caffeine 1-2 cups daily avg.      Family History:   Family History  Problem Relation Age of Onset  . Heart attack Father   . Heart failure Mother   . Cancer Mother        colon,kidney  . Diabetes Sister   . Hypertension Sister   . Heart failure Sister    Family Status:  Family Status  Relation Name Status  . Father  Deceased at age 20       MI  . Mother  Deceased       CHF  . Sister  Alive       Palpitations  . Sister  Alive    ROS:  Please see the history of present illness.  All other ROS reviewed and negative.     Physical Exam/Data:   Vitals:   02/23/17 1230 02/23/17 1300 02/23/17 1314 02/23/17 1407  BP: 132/84 126/88  (!) 141/86  Pulse: (!) 120 (!) 112    Resp: 18 (!) 22  (!) 28  Temp:   98.4 F (36.9 C) 97.7 F (36.5 C)  TempSrc:   Oral Oral  SpO2: 96% 93%    Weight:      Height:        Intake/Output Summary (Last 24 hours) at 02/23/2017 1436 Last data filed at 02/23/2017 0420 Gross per 24 hour  Intake 2000 ml  Output -  Net 2000 ml   Filed Weights   02/23/17 0053  Weight: 171 lb (77.6 kg)  Body mass index is 33.4 kg/m.   General: Well developed, well nourished, NAD Skin: Warm, dry, intact  Head: Normocephalic, atraumatic, clear, moist mucus membranes. Neck: Negative for carotid bruits. No JVD Lungs: Clear to ausculation bilaterally. No wheezes, rales, or rhonchi. Breathing is unlabored. Cardiovascular: RRR with S1 S2. No murmurs, rubs, or gallops Abdomen: Soft, non-tender, non-distended with normoactive bowel sounds. No obvious abdominal masses. MSK: Strength and tone appear normal for age. 5/5 in all extremities Extremities: No edema. No clubbing or cyanosis. DP/PT pulses 2+ bilaterally Neuro: Alert and oriented. No focal deficits. No facial asymmetry. MAE spontaneously. Psych: Responds to questions appropriately with normal affect.     EKG:  The EKG was personally reviewed and demonstrates: 02/23/17- ST with first degree AV block versus Aflutter  Telemetry:  Telemetry  was personally reviewed and demonstrates: 02/23/17- Aflutter-rate 125  Relevant CV Studies:  ECHO: 03/02/2016  Study Conclusions  - Left ventricle: The cavity size was normal. Systolic function was   normal. The estimated ejection fraction was in the range of 60%   to 65%. Wall motion was normal; there were no regional wall   motion abnormalities. The study is not technically sufficient to   allow evaluation of LV diastolic function. - Aortic valve: Transvalvular velocity was within the normal range.   There was no stenosis. There was no regurgitation. - Mitral valve: Calcified annulus. Transvalvular velocity was   within the normal range. There was no evidence for stenosis.   There was moderate regurgitation directed centrally. - Left atrium: The atrium was severely dilated. - Right ventricle: The cavity size was normal. Wall thickness was   normal. Systolic function was normal. - Right atrium: The atrium was severely dilated. - Tricuspid valve: There was moderate regurgitation. - Pulmonic valve: There was moderate regurgitation. - Pulmonary arteries: Systolic pressure was severely increased. PA   peak pressure: 71 mm Hg (S). - Pericardium, extracardiac: A small pericardial effusion was   identified. Features were not consistent with tamponade   physiology.  CATH: 07/14/2015   LM lesion, 30% stenosed.  Prox RCA lesion, 30% stenosed.  Mid LAD to Dist LAD lesion, 10% stenosed.  Laboratory Data:  Chemistry Recent Labs  Lab 02/23/17 0138  NA 135  K 4.5  CL 103  CO2 26  GLUCOSE 163*  BUN 32*  CREATININE 1.31*  CALCIUM 9.2  GFRNONAA 37*  GFRAA 42*  ANIONGAP 6    Total Protein  Date Value Ref Range Status  11/30/2016 6.6 6.5 - 8.1 g/dL Final   Albumin  Date Value Ref Range Status  11/30/2016 3.7 3.5 - 5.0 g/dL Final   AST  Date Value Ref Range Status  11/30/2016 21 15 - 41 U/L Final   ALT  Date Value Ref Range Status  11/30/2016 17 14 - 54 U/L Final     Alkaline Phosphatase  Date Value Ref Range Status  11/30/2016 61 38 - 126 U/L Final   Total Bilirubin  Date Value Ref Range Status  11/30/2016 1.0 0.3 - 1.2 mg/dL Final   Hematology Recent Labs  Lab 02/23/17 0138  WBC 9.1  RBC 4.07  HGB 13.0  HCT 38.4  MCV 94.3  MCH 31.9  MCHC 33.9  RDW 13.2  PLT 181   Cardiac Enzymes Recent Labs  Lab 02/23/17 0507 02/23/17 0807 02/23/17 1033  TROPONINI <0.03 <0.03 <0.03    Recent Labs  Lab 02/23/17 0150  TROPIPOC 0.01    BNPNo results for input(s): BNP, PROBNP in the  last 168 hours.  DDimer No results for input(s): DDIMER in the last 168 hours. TSH:  Lab Results  Component Value Date   TSH 2.374 11/30/2016   Lipids: Lab Results  Component Value Date   CHOL 153 02/23/2017   HDL 62 02/23/2017   LDLCALC 64 02/23/2017   TRIG 137 02/23/2017   CHOLHDL 2.5 02/23/2017   HgbA1c: Lab Results  Component Value Date   HGBA1C 7.0 (H) 02/23/2017    Radiology/Studies:  Dg Chest 2 View  Result Date: 02/23/2017 CLINICAL DATA:  Chest pain EXAM: CHEST  2 VIEW COMPARISON:  Chest radiograph 09/04/2016 FINDINGS: Mild cardiomegaly. Unchanged elevation of the right hemidiaphragm. No focal airspace consolidation or pulmonary edema. No pleural effusion or pneumothorax. IMPRESSION: No active cardiopulmonary disease. Electronically Signed   By: Ulyses Jarred M.D.   On: 02/23/2017 01:23    Assessment and Plan:   1. Atypical chest pain r/t atrial arhythmia: -Troponin levels negative: 0.01>0.03 -Non-obstructive CAD per cath 2017 -Echocardiogram 2017 EF 60-65% -Continue ASA, BB, Eliquis -Will increase metoprolol succinate to 25mg  daily -Will consult EP -EP to consult for further management. -CHA2DS2-VASc score 4, on anticoagulation with Eliquis  -Sotalol on hold due to increased QTc, she is not a Tikosyn candidate  2. AKI:  -Cr today 1.31, stable -Appears that she is at her baseline -Will continue to monitor with BMET -She is  getting 5ml/hr IV hydration    Active Problems:   Multifactorial gait disorder   Sleep apnea with use of continuous positive airway pressure (CPAP)   Adjustment disorder with mixed anxiety and depressed mood   OSA on CPAP   Ataxia   Falls   Osteoarthritis of right hip   Chest pain with moderate risk for cardiac etiology   Essential hypertension   Pulmonary arterial hypertension (HCC)   Hypercholesterolemia   Persistent atrial fibrillation (HCC)   Chest pain   GERD (gastroesophageal reflux disease)   Diabetes ([place])     For questions or updates, please contact [facility] Please consult [email or web address] for contact info under Cardiology/STEMI.   Signed, Kathyrn Drown, NP  02/23/2017 2:36 PM Patient seen and examined and history reviewed. Agree with above findings and plan. Mrs Massey is well known to me. Seen for chest pain in the setting of recurrent atrial flutter/tachycardia. Prior ischemic evaluation negative. Last DCCV in October for same. When seen in follow up pulse 61. Patient reports it lasted about one month but since then has had a persistent tachycardia with HR 125. She notes she felt much better when in NSR. She has been on Sotalol 160 mg bid and Eliquis. On exam she is in NAD Lungs clear.  CV tachy. No gallop or murmur No edema  Ecg reviewed: atrial tachy versus atypical atrial flutter with rate 120s. Prolonged QTc. I have personally reviewed and interpreted this study.  Impression: recurrent Atrial tachy/flutter despite Sotalol. Now also with prolonged QT. She is symptomatic  Plan: will hold Sotalol. Increase Toprol dose. Continue Eliquis.  Will  Ask EP to see in am. I am not sure if she is a candidate for alternative antiarrhythmic drug therapy or ablation. Will update Echo.    Nicodemus Denk Martinique, [place] 02/23/2017 5:09 PM

## 2017-02-23 NOTE — ED Notes (Signed)
Carb Modified lunch Tray Ordered

## 2017-02-23 NOTE — Telephone Encounter (Addendum)
Late entry- Me     02/22/17 12:08 PM  Note    Pt cld reporting HR 121 and BP 126/79.  Pt was instructed per Roderic Palau, NP to take 12.5 mg Metoprolol now and to recheck vitals and call us back about 4:30 today with progress. Pt has already taken morning dose of metoprolol.  Pt understood instructions and will call back

## 2017-02-23 NOTE — ED Notes (Signed)
ED Provider at bedside. 

## 2017-02-23 NOTE — ED Notes (Signed)
Carb lunch tray ordered at 1031

## 2017-02-23 NOTE — ED Triage Notes (Signed)
Pt BIB EMS from home. Pt had onset of central CP/pressure that radiated to her bilat jaw, neck. Also reports some SOB. Denies N/V/D, lightheadedness, dizziness. Took 324mg  ASA prior to EMS arrival. Pt has hx of afib requiring cardioversion, 3x in the last year. Pain decr'd en route and pt in NAD at this time.

## 2017-02-23 NOTE — Evaluation (Signed)
Physical Therapy Evaluation Patient Details Name: Melinda [name] MRN: [id number] DOB: 1932-02-23 Today's Date: 02/23/2017   History of Present Illness  Pt is an 83 y.o. female with PMH significant for DM, GERD, CAD, a-fib, anxiety, OA, brought to ER on 02/23/17 with severe substernal chest pain radiating to jaw; troponin negative and EKG without evidence of ACS.     Clinical Impression  Patient evaluated by Physical Therapy with no further acute PT needs identified. PTA, pt indep and lives at home alone; sister lives next door and available for 24/7 support if needed. Pt does endorse a few falls over the past 3 months. Today, instability noted while ambulating with no UE support; stability much improved and pt mod indep using RW. Recommend pt use RW for household and community ambulation to decrease risk for falls; pt in agreement with this. Recommend outpatient PT to further address in-depth balance deficits. All education has been completed and the patient has no further questions. PT is signing off. Thank you for this referral.    Follow Up Recommendations Outpatient PT    Equipment Recommendations  None recommended by PT(owns DME)    Recommendations for Other Services       Precautions / Restrictions Precautions Precautions: Fall Restrictions Weight Bearing Restrictions: No      Mobility  Bed Mobility               General bed mobility comments: Received sitting in chair  Transfers Overall transfer level: Needs assistance Equipment used: Rolling walker (2 wheeled) Transfers: Sit to/from Stand Sit to Stand: Supervision;Modified independent (Device/Increase time)         General transfer comment: Supervision standing with no DME. Mod indep with RW  Ambulation/Gait Ambulation/Gait assistance: Modified independent (Device/Increase time) Ambulation Distance (Feet): 250 Feet Assistive device: Rolling walker (2 wheeled);None Gait Pattern/deviations:  Step-through pattern;Decreased stride length Gait velocity: Decreased Gait velocity interpretation: <1.8 ft/sec, indicative of risk for recurrent falls General Gait Details: Unsteady amb with no DME, reaching out for furniture/rails for UE support and stability, requiring supervision for safety. Stability much improved using RW; pt mod indep with RW  Stairs            Wheelchair Mobility    Modified Rankin (Stroke Patients Only)       Balance Overall balance assessment: Needs assistance   Sitting balance-Leahy Scale: Good       Standing balance-Leahy Scale: Fair                               Pertinent Vitals/Pain Pain Assessment: No/denies pain    Home Living Family/patient expects to be discharged to:: Private residence Living Arrangements: Alone Available Help at Discharge: Family;Available PRN/intermittently Type of Home: House Home Access: Stairs to enter Entrance Stairs-Rails: Right Entrance Stairs-Number of Steps: 2 Home Layout: Two level;Able to live on main level with bedroom/bathroom Home Equipment: Gilford Rile - 2 wheels;Walker - standard;Shower seat;Grab bars - toilet;Grab bars - tub/shower Additional Comments: Sister lives next door and available 24/7 if needed    Prior Function Level of Independence: Independent with assistive device(s)         Comments: Indep with intermittent use of RW or 'furniture surfing'     Hand Dominance        Extremity/Trunk Assessment   Upper Extremity Assessment Upper Extremity Assessment: Overall WFL for tasks assessed    Lower Extremity Assessment Lower Extremity Assessment: Generalized weakness  Cervical / Trunk Assessment Cervical / Trunk Assessment: Normal  Communication   Communication: No difficulties  Cognition Arousal/Alertness: Awake/alert Behavior During Therapy: WFL for tasks assessed/performed Overall Cognitive Status: Within Functional Limits for tasks assessed                                         General Comments General comments (skin integrity, edema, etc.): HR 120-127 throughout session    Exercises     Assessment/Plan    PT Assessment All further PT needs can be met in the next venue of care  PT Problem List Decreased strength;Decreased activity tolerance;Decreased balance;Decreased mobility       PT Treatment Interventions      PT Goals (Current goals can be found in the Care Plan section)  Acute Rehab PT Goals PT Goal Formulation: All assessment and education complete, DC therapy    Frequency     Barriers to discharge        Co-evaluation               AM-PAC PT "6 Clicks" Daily Activity  Outcome Measure Difficulty turning over in bed (including adjusting bedclothes, sheets and blankets)?: None Difficulty moving from lying on back to sitting on the side of the bed? : None Difficulty sitting down on and standing up from a chair with arms (e.g., wheelchair, bedside commode, etc,.)?: None Help needed moving to and from a bed to chair (including a wheelchair)?: None Help needed walking in hospital room?: None Help needed climbing 3-5 steps with a railing? : A Little 6 Click Score: 23    End of Session Equipment Utilized During Treatment: Gait belt Activity Tolerance: Patient tolerated treatment well Patient left: in chair;with call bell/phone within reach Nurse Communication: Mobility status PT Visit Diagnosis: Other abnormalities of gait and mobility (R26.89)    Time: 7955-8316 PT Time Calculation (min) (ACUTE ONLY): 27 min   Charges:   PT Evaluation $PT Eval Low Complexity: 1 Low PT Treatments $Gait Training: 8-22 mins   PT G Codes:   PT G-Codes **NOT FOR INPATIENT CLASS** Functional Assessment Tool Used: AM-PAC 6 Clicks Basic Mobility Functional Limitation: Mobility: Walking and moving around Mobility: Walking and Moving Around Current Status (F4255): At least 1 percent but less than 20 percent  impaired, limited or restricted Mobility: Walking and Moving Around Goal Status [phone]): At least 1 percent but less than 20 percent impaired, limited or restricted Mobility: Walking and Moving Around Discharge Status [phone]): At least 1 percent but less than 20 percent impaired, limited or restricted   Mabeline Caras, PT, DPT Acute Rehab Services  Pager: [place] 02/23/2017, 3:25 PM

## 2017-02-23 NOTE — ED Notes (Signed)
Paged admitting MD regarding pharmacy contraindication of Sotalol due to QTc elevation. Repeated EKG Qtc remains elevated, advised by admitting MD to page cardiology. Cards master notified RN that they have not seen patient yet. Will hold sotalol pending cards consult.

## 2017-02-24 ENCOUNTER — Encounter (HOSPITAL_COMMUNITY): Payer: Self-pay | Admitting: General Practice

## 2017-02-24 ENCOUNTER — Encounter: Payer: Medicare HMO | Admitting: Rehabilitative and Restorative Service Providers"

## 2017-02-24 ENCOUNTER — Observation Stay (HOSPITAL_BASED_OUTPATIENT_CLINIC_OR_DEPARTMENT_OTHER): Payer: Medicare HMO

## 2017-02-24 ENCOUNTER — Other Ambulatory Visit: Payer: Self-pay

## 2017-02-24 DIAGNOSIS — I340 Nonrheumatic mitral (valve) insufficiency: Secondary | ICD-10-CM | POA: Diagnosis not present

## 2017-02-24 DIAGNOSIS — I480 Paroxysmal atrial fibrillation: Secondary | ICD-10-CM | POA: Diagnosis not present

## 2017-02-24 DIAGNOSIS — Z8249 Family history of ischemic heart disease and other diseases of the circulatory system: Secondary | ICD-10-CM | POA: Diagnosis not present

## 2017-02-24 DIAGNOSIS — K5909 Other constipation: Secondary | ICD-10-CM | POA: Diagnosis present

## 2017-02-24 DIAGNOSIS — E785 Hyperlipidemia, unspecified: Secondary | ICD-10-CM | POA: Diagnosis present

## 2017-02-24 DIAGNOSIS — Z7901 Long term (current) use of anticoagulants: Secondary | ICD-10-CM | POA: Diagnosis not present

## 2017-02-24 DIAGNOSIS — R079 Chest pain, unspecified: Secondary | ICD-10-CM | POA: Diagnosis not present

## 2017-02-24 DIAGNOSIS — I5032 Chronic diastolic (congestive) heart failure: Secondary | ICD-10-CM | POA: Diagnosis not present

## 2017-02-24 DIAGNOSIS — Z833 Family history of diabetes mellitus: Secondary | ICD-10-CM | POA: Diagnosis not present

## 2017-02-24 DIAGNOSIS — N179 Acute kidney failure, unspecified: Secondary | ICD-10-CM | POA: Diagnosis not present

## 2017-02-24 DIAGNOSIS — N183 Chronic kidney disease, stage 3 (moderate): Secondary | ICD-10-CM | POA: Diagnosis present

## 2017-02-24 DIAGNOSIS — R000 Tachycardia, unspecified: Secondary | ICD-10-CM | POA: Diagnosis not present

## 2017-02-24 DIAGNOSIS — R270 Ataxia, unspecified: Secondary | ICD-10-CM | POA: Diagnosis present

## 2017-02-24 DIAGNOSIS — I2721 Secondary pulmonary arterial hypertension: Secondary | ICD-10-CM | POA: Diagnosis present

## 2017-02-24 DIAGNOSIS — I484 Atypical atrial flutter: Secondary | ICD-10-CM | POA: Diagnosis not present

## 2017-02-24 DIAGNOSIS — Z79899 Other long term (current) drug therapy: Secondary | ICD-10-CM | POA: Diagnosis not present

## 2017-02-24 DIAGNOSIS — I4891 Unspecified atrial fibrillation: Secondary | ICD-10-CM | POA: Diagnosis present

## 2017-02-24 DIAGNOSIS — E1122 Type 2 diabetes mellitus with diabetic chronic kidney disease: Secondary | ICD-10-CM | POA: Diagnosis not present

## 2017-02-24 DIAGNOSIS — I130 Hypertensive heart and chronic kidney disease with heart failure and stage 1 through stage 4 chronic kidney disease, or unspecified chronic kidney disease: Secondary | ICD-10-CM | POA: Diagnosis not present

## 2017-02-24 DIAGNOSIS — I2510 Atherosclerotic heart disease of native coronary artery without angina pectoris: Secondary | ICD-10-CM | POA: Diagnosis present

## 2017-02-24 DIAGNOSIS — Z96641 Presence of right artificial hip joint: Secondary | ICD-10-CM | POA: Diagnosis not present

## 2017-02-24 DIAGNOSIS — G4733 Obstructive sleep apnea (adult) (pediatric): Secondary | ICD-10-CM | POA: Diagnosis not present

## 2017-02-24 DIAGNOSIS — K219 Gastro-esophageal reflux disease without esophagitis: Secondary | ICD-10-CM | POA: Diagnosis not present

## 2017-02-24 DIAGNOSIS — I481 Persistent atrial fibrillation: Secondary | ICD-10-CM | POA: Diagnosis not present

## 2017-02-24 DIAGNOSIS — Z90710 Acquired absence of both cervix and uterus: Secondary | ICD-10-CM | POA: Diagnosis not present

## 2017-02-24 DIAGNOSIS — E7800 Pure hypercholesterolemia, unspecified: Secondary | ICD-10-CM | POA: Diagnosis not present

## 2017-02-24 DIAGNOSIS — F4323 Adjustment disorder with mixed anxiety and depressed mood: Secondary | ICD-10-CM | POA: Diagnosis present

## 2017-02-24 LAB — BASIC METABOLIC PANEL
ANION GAP: 8 (ref 5–15)
BUN: 27 mg/dL — ABNORMAL HIGH (ref 6–20)
CALCIUM: 9.3 mg/dL (ref 8.9–10.3)
CO2: 28 mmol/L (ref 22–32)
Chloride: 99 mmol/L — ABNORMAL LOW (ref 101–111)
Creatinine, Ser: 1.21 mg/dL — ABNORMAL HIGH (ref 0.44–1.00)
GFR calc non Af Amer: 40 mL/min — ABNORMAL LOW (ref >60)
GFR, EST AFRICAN AMERICAN: 47 mL/min — AB (ref >60)
Glucose, Bld: 155 mg/dL — ABNORMAL HIGH (ref 65–99)
Potassium: 4.7 mmol/L (ref 3.5–5.1)
SODIUM: 135 mmol/L (ref 135–145)

## 2017-02-24 LAB — GLUCOSE, CAPILLARY
GLUCOSE-CAPILLARY: 145 mg/dL — AB (ref 65–99)
GLUCOSE-CAPILLARY: 174 mg/dL — AB (ref 65–99)
GLUCOSE-CAPILLARY: 200 mg/dL — AB (ref 65–99)
Glucose-Capillary: 137 mg/dL — ABNORMAL HIGH (ref 65–99)

## 2017-02-24 LAB — ECHOCARDIOGRAM COMPLETE
AOASC: 28 cm
CHL CUP MV DEC (S): 144
CHL CUP TV REG PEAK VELOCITY: 270 cm/s
E decel time: 144 msec
FS: 22 % — AB (ref 28–44)
HEIGHTINCHES: 60 in
IVS/LV PW RATIO, ED: 0.77
LA ID, A-P, ES: 50 mm
LA diam end sys: 50 mm
LA vol A4C: 60 ml
LA vol index: 42.1 mL/m2
LA vol: 77.8 mL
LADIAMINDEX: 2.71 cm/m2
LDCA: 3.46 cm2
LVOTD: 21 mm
MV pk A vel: 46.1 m/s
MV pk E vel: 91.7 m/s
MVPG: 3 mmHg
PV Reg grad dias: 8 mmHg
PV Reg vel dias: 143 cm/s
PW: 11.5 mm — AB (ref 0.6–1.1)
TAPSE: 14.7 mm
TR max vel: 270 cm/s
WEIGHTICAEL: 2736 [oz_av]

## 2017-02-24 LAB — CBC
HCT: 42.8 % (ref 36.0–46.0)
HEMOGLOBIN: 14.3 g/dL (ref 12.0–15.0)
MCH: 32 pg (ref 26.0–34.0)
MCHC: 33.4 g/dL (ref 30.0–36.0)
MCV: 95.7 fL (ref 78.0–100.0)
PLATELETS: 215 10*3/uL (ref 150–400)
RBC: 4.47 MIL/uL (ref 3.87–5.11)
RDW: 13.6 % (ref 11.5–15.5)
WBC: 10.2 10*3/uL (ref 4.0–10.5)

## 2017-02-24 MED ORDER — POLYETHYLENE GLYCOL 3350 17 G PO PACK
17.0000 g | PACK | Freq: Two times a day (BID) | ORAL | Status: DC
  Filled 2017-02-24 (×2): qty 1

## 2017-02-24 MED ORDER — METOPROLOL SUCCINATE ER 25 MG PO TB24
25.0000 mg | ORAL_TABLET | Freq: Two times a day (BID) | ORAL | Status: DC
  Administered 2017-02-24 – 2017-02-25 (×2): 25 mg via ORAL
  Filled 2017-02-24 (×2): qty 1

## 2017-02-24 MED ORDER — AMIODARONE HCL 200 MG PO TABS
200.0000 mg | ORAL_TABLET | Freq: Every day | ORAL | Status: DC
  Administered 2017-02-24 – 2017-02-25 (×2): 200 mg via ORAL
  Filled 2017-02-24 (×2): qty 1

## 2017-02-24 NOTE — Progress Notes (Signed)
Pt placed on CPAP for the night with 2lpm oxygen.  Pt tolerating nasal mask well.

## 2017-02-24 NOTE — Consult Note (Signed)
Cardiology Consultation:   Patient ID: Melinda [name]; [id number]; 1931-07-22   Admit date: 02/23/2017 Date of Consult: 02/24/2017  Primary Care Provider: Crist Infante, MD Primary Cardiologist: Dr. Martinique Primary Electrophysiologist:  Dr. Lovena Le   Patient Profile:   Melinda [name] is a 83 y.o. female with a hx of HTN, HLD, non-obsrtuctive CAD, DM, and paroxysmal AF who is being seen today for the evaluation of aflutter at the request of Dr. Martinique.  History of Present Illness:   [name] AF history is associated with a hospital stay 02/25/16 with CHF felt secondary to her AFib w/RVR and then again 02/29/16 with CP associated with rapid AFib, she was referred to the AF clinic by Dr. Martinique to discuss rhythm control strategies given she is very symptomatic when in AF and wanted to purse AAD tx.  In d/w Roderic Palau, evaluation into Tikosyn noted the cost would be prohibitive for the patient and was decided to purse Sotalol.  She was loaded on Sotalol earlier  This year in March 2017, did OK until Sept seen in the AF clinic noted in an aflutter BB was increased without improvement in rate.  DCCV resulted in sinus bradycardia/hypotension requiring intervention though did resolve.  She last saw Dr. Martinique Oct 15, given was the first  Time in 6 months on Sotalol with breakthrough arrhythmia no changes were made.  She was [facility] with c/o CP, found in [place].  She feels well currently, no CP, no awareness of her rythym or tachycardia currently  LABS K+ 4.5 BUN/Creat 32/1.31 (baseline looks around 1), clearance is 40 currently Trop I: <0.03 x3 WBC 9.1 H/H 13/38 Plts 181 Ddimer <0.27  Current rate/rhythm meds: Metoprolol 25mg  daily Sotalol 160mg  BID d/c, none given here    Past Medical History:  Diagnosis Date  . A-fib ([place])   . Acid reflux   . Anxiety   . Arthritis   . Cataract    surgery,bilateral  . Constipation   . Depression   . Diabetes  mellitus without complication (HCC)    Pre diabetes  . Dyspnea on exertion    a. 07/2015 Echo: EF 55-60%, no rwma, mild MR, mod dil LA/RA/RV, mod TR, triv PR, PASP 72mmHg.  . Falls   . Family history of adverse reaction to anesthesia    mother always had n/v  . Hx: UTI (urinary tract infection)   . Hypercholesterolemia   . Hypertensive heart disease   . Insomnia   . Multifactorial gait disorder   . Non-obstructive CAD    a. 07/2015 Cath: LM 30-40d, LAD min irregs, LCX min irregs, RCA 30p, RA 17, RV 33/7, PA 42/19, PCWP 17.  . Osteoarthritis    a. 06/2015 s/p R total hip arthroplasty.  [name] PAF (paroxysmal atrial fibrillation) ([place])    a. Dx 07/2015, CHA2DS2VASc = 6-->Eliquis.  . Premature atrial contractions   . Pulmonary hypertension ([place])    a. 07/2015 Echo: PASP 81mmHg.  [name] Pyloric antral stenosis    in childhood,infancy  . Sleep apnea with use of continuous positive airway pressure (CPAP) 10/19/2012   a. compliant w/ CPAP.    Past Surgical History:  Procedure Laterality Date  . ABDOMINAL HYSTERECTOMY  1970  . ABDOMINAL SURGERY     as a 48 day old baby  . CARDIAC CATHETERIZATION N/A 07/14/2015   Procedure: Right/Left Heart Cath and Coronary Angiography;  Surgeon: Sanda Klein, MD;  Location: [facility];  Service: Cardiovascular;  Laterality: N/A;  .  CARDIOVERSION N/A 11/04/2015   Procedure: CARDIOVERSION;  Surgeon: Lelon Perla, MD;  Location: [facility] ENDOSCOPY;  Service: Cardiovascular;  Laterality: N/A;  . CARDIOVERSION N/A 02/25/2016   Procedure: CARDIOVERSION;  Surgeon: Pixie Casino, MD;  Location: [facility] ENDOSCOPY;  Service: Cardiovascular;  Laterality: N/A;  . CARDIOVERSION N/A 12/13/2016   Procedure: CARDIOVERSION;  Surgeon: Fay Records, MD;  Location: [facility];  Service: Cardiovascular;  Laterality: N/A;  . COLONOSCOPY    . EYE SURGERY Bilateral    cataract surgery with lens implant  . INNER EAR SURGERY Left    x 3  . JOINT REPLACEMENT Right   . LAPAROSCOPIC  SALPINGO OOPHERECTOMY    . NM MYOVIEW LTD  11/30/2007   normal stress nuclear study/EF-83%  . TONSILLECTOMY    . TOTAL HIP ARTHROPLASTY Right 06/26/2015   Procedure: RIGHT TOTAL HIP ARTHROPLASTY ANTERIOR APPROACH;  Surgeon: Leandrew Koyanagi, MD;  Location: [facility];  Service: Orthopedics;  Laterality: Right;  . TRANSTHORACIC ECHOCARDIOGRAM  07/23/2009   mild concentric LVH/mild mitral insufficiency/ moderate tricuspid innsufficiency/mild pulmonary HTN/EF- 55-60%  . VAGINAL DELIVERY         Inpatient Medications: Scheduled Meds: . amLODipine  2.5 mg Oral Daily  . apixaban  5 mg Oral BID  . aspirin  325 mg Oral Daily  . atorvastatin  40 mg Oral Daily  . calcium-vitamin D  1 tablet Oral Q breakfast  . cholecalciferol  1,000 Units Oral Daily  . desipramine  25 mg Oral Daily  . diclofenac sodium  2 g Topical QID  . famotidine  20 mg Oral Daily  . insulin aspart  0-9 Units Subcutaneous TID WC  . linaclotide  145 mcg Oral QAC breakfast  . LORazepam  0.5 mg Oral QHS  . metoprolol succinate  25 mg Oral Daily  . polyethylene glycol  17 g Oral BID  . potassium chloride  20 mEq Oral Daily  . torsemide  10 mg Oral Daily  . vitamin B-12  1,000 mcg Oral Daily   Continuous Infusions: . sodium chloride 75 mL/hr at 02/23/17 0440   PRN Meds: alum & mag hydroxide-simeth, morphine injection, polyvinyl alcohol  Allergies:    Allergies  Allergen Reactions  . Lasix [Furosemide] Itching  . Phenobarbital Itching  . Amitiza [Lubiprostone] Itching    Social History:   Social History   Socioeconomic History  . Marital status: Widowed    Spouse name: Not on file  . Number of children: 1  . Years of education: 37  . Highest education level: Not on file  Social Needs  . Financial resource strain: Not on file  . Food insecurity - worry: Not on file  . Food insecurity - inability: Not on file  . Transportation needs - medical: Not on file  . Transportation needs - non-medical: Not on file    Occupational History  . Occupation: Retired  Tobacco Use  . Smoking status: Never Smoker  . Smokeless tobacco: Never Used  Substance and Sexual Activity  . Alcohol use: No    Alcohol/week: 0.0 oz    Comment: rare  . Drug use: No  . Sexual activity: Not on file  Other Topics Concern  . Not on file  Social History Narrative   Patient lives alone.    Patient is widowed.    Patient retired.    Patient some college.    Patient has one child.    Caffeine 1-2 cups daily avg.    Family History:  Family History  Problem Relation Age of Onset  . Heart attack Father   . Heart failure Mother   . Cancer Mother        colon,kidney  . Diabetes Sister   . Hypertension Sister   . Heart failure Sister      ROS:  Please see the history of present illness.  ROS  All other ROS reviewed and negative.     Physical Exam/Data:   Vitals:   02/23/17 1314 02/23/17 1407 02/23/17 2028 02/24/17 0446  BP:  (!) 141/86 (!) 145/101 (!) 146/94  Pulse:   (!) 119 (!) 116  Resp:  (!) 28 (!) 27 (!) 26  Temp: 98.4 F (36.9 C) 97.7 F (36.5 C) 98.1 F (36.7 C) 98.1 F (36.7 C)  TempSrc: Oral Oral Oral Oral  SpO2:      Weight:      Height:        Intake/Output Summary (Last 24 hours) at 02/24/2017 1229 Last data filed at 02/23/2017 1800 Gross per 24 hour  Intake 1000 ml  Output -  Net 1000 ml   Filed Weights   02/23/17 0053  Weight: 171 lb (77.6 kg)   Body mass index is 33.4 kg/m.  General:  Well nourished, well developed, in no acute distress, OOB to  HEENT: normal Lymph: no adenopathy Neck: no JVD Endocrine:  No thryomegaly Vascular: No carotid bruits Cardiac:  iRRR/tachycardic; no murmurs, gallops or rubs Lungs:  CTA b/l, no wheezing, rhonchi or rales  Abd: soft, nontender, obese  Ext: no edema Musculoskeletal:  No deformities, age appropriate atrophy Skin: warm and dry  Neuro: no focal abnormalities noted Psych:  Normal affect    EKG:  The EKG was personally  reviewed and demonstrates:   #1 AFlutter 117bpm, IVCD/LAD #2 Atypical AFlutter, 116bpm Telemetry:  Telemetry was personally reviewed and demonstrates:   AFlutter 100-130 range  Relevant CV Studies:  ECHO: 03/02/2016 Study Conclusions - Left ventricle: The cavity size was normal. Systolic function was normal. The estimated ejection fraction was in the range of 60% to 65%. Wall motion was normal; there were no regional wall motion abnormalities. The study is not technically sufficient to allow evaluation of LV diastolic function. - Aortic valve: Transvalvular velocity was within the normal range. There was no stenosis. There was no regurgitation. - Mitral valve: Calcified annulus. Transvalvular velocity was within the normal range. There was no evidence for stenosis. There was moderate regurgitation directed centrally. - Left atrium: The atrium was severely dilated. (72mm) - Right ventricle: The cavity size was normal. Wall thickness was normal. Systolic function was normal. - Right atrium: The atrium was severely dilated. - Tricuspid valve: There was moderate regurgitation. - Pulmonic valve: There was moderate regurgitation. - Pulmonary arteries: Systolic pressure was severely increased. PA peak pressure: 71 mm Hg (S). - Pericardium, extracardiac: A small pericardial effusion was identified. Features were not consistent with tamponade physiology.   CATH: 07/14/2015  LM lesion, 30% stenosed.  Prox RCA lesion, 30% stenosed.  Mid LAD to Dist LAD lesion, 10% stenosed.   Laboratory Data:  Chemistry Recent Labs  Lab 02/23/17 0138  NA 135  K 4.5  CL 103  CO2 26  GLUCOSE 163*  BUN 32*  CREATININE 1.31*  CALCIUM 9.2  GFRNONAA 37*  GFRAA 42*  ANIONGAP 6    No results for input(s): PROT, ALBUMIN, AST, ALT, ALKPHOS, BILITOT in the last 168 hours. Hematology Recent Labs  Lab 02/23/17 0138  WBC 9.1  RBC  4.07  HGB 13.0  HCT 38.4  MCV 94.3   MCH 31.9  MCHC 33.9  RDW 13.2  PLT 181   Cardiac Enzymes Recent Labs  Lab 02/23/17 0507 02/23/17 0807 02/23/17 1033  TROPONINI <0.03 <0.03 <0.03    Recent Labs  Lab 02/23/17 0150  TROPIPOC 0.01    BNPNo results for input(s): BNP, PROBNP in the last 168 hours.  DDimer  Recent Labs  Lab 02/23/17 1408  DDIMER <0.27    Radiology/Studies:   Dg Chest 2 View Result Date: 02/23/2017 CLINICAL DATA:  Chest pain EXAM: CHEST  2 VIEW COMPARISON:  Chest radiograph 09/04/2016 FINDINGS: Mild cardiomegaly. Unchanged elevation of the right hemidiaphragm. No focal airspace consolidation or pulmonary edema. No pleural effusion or pneumothorax. IMPRESSION: No active cardiopulmonary disease. Electronically Signed   By: Ulyses Jarred M.D.   On: 02/23/2017 01:23    Assessment and Plan:   1. Atypical AFlutter,RVR     Another breakthrough despite AAD w/Sotalol, Tikosyn is cost prohibitive     Agree with up-titration of BB for rate control     May need to change to amiodarone  BP can support more BB, last SR EKG with rate 61, she had transient bradycardia post DCCV last time, though home dose looked like 12..5 PM, and 25 AM, was contnued here only 25mg  daily, will make BID   LA described as severely dilated, 48mm, may make maintenance of NSR more difficult.  2. CP     Resolved     Neg Trop 3, no obstructive CAD by cath last  Year  3. HTN     Follow    For questions or updates, please contact [place] Please consult [email or web address] for contact info under Cardiology/STEMI.   Signed, Baldwin Jamaica, PA-C  02/24/2017 12:29 PM  EP Attending Patient seen and examined. Agree with a bove. She is known to me from prior clinic visits. She has atypical atrial flutter/tachycardia and presented with a break through and associated chest pain. Her symptoms are improved. She has failed sotalol. I would recommend initiation of amiodarone. 200 mg twice daily starting tomorrow. She could be  discharged after with an ECG in the office in a week. DCCV would be recommended in 3-4 weeks. She could ultimately end up needing a PPM due to sinus node dysfunction.   Mikle Bosworth.D.

## 2017-02-24 NOTE — Evaluation (Signed)
Occupational Therapy Evaluation Patient Details Name: Melinda [name] MRN: [id number] DOB: [birth year] Today's Date: 02/24/2017    History of Present Illness Pt is an 83 y.o. female with PMH significant for DM, GERD, CAD, a-fib, anxiety, OA, brought to ER on 02/23/17 with severe substernal chest pain radiating to jaw; troponin negative and EKG without evidence of ACS.    Clinical Impression   Patient evaluated by Occupational Therapy with no further acute OT needs identified. All education has been completed and the patient has no further questions. Pt is mod I with ADLs.  All education completed.   See below for any follow-up Occupational Therapy or equipment needs. OT is signing off. Thank you for this referral.      Follow Up Recommendations  No OT follow up;Supervision - Intermittent    Equipment Recommendations  None recommended by OT    Recommendations for Other Services       Precautions / Restrictions Precautions Precautions: Fall Precaution Comments: h/o falls       Mobility Bed Mobility               General bed mobility comments: Pt sitting EOB   Transfers Overall transfer level: Modified independent                    Balance Overall balance assessment: Needs assistance Sitting-balance support: Feet supported Sitting balance-Leahy Scale: Good     Standing balance support: No upper extremity supported Standing balance-Leahy Scale: Good                             ADL either performed or assessed with clinical judgement   ADL Overall ADL's : Modified independent                                       General ADL Comments: reviewed walker safety with pt - removing area rugs, using walker bag/basket, using countertops to move items in kitchen. She verbalized understanding.  She was able to simulate tub transfer mod I      Vision         Perception     Praxis      Pertinent Vitals/Pain Pain  Assessment: No/denies pain     Hand Dominance Right   Extremity/Trunk Assessment Upper Extremity Assessment Upper Extremity Assessment: Overall WFL for tasks assessed   Lower Extremity Assessment Lower Extremity Assessment: Defer to PT evaluation   Cervical / Trunk Assessment Cervical / Trunk Assessment: Normal   Communication Communication Communication: No difficulties   Cognition Arousal/Alertness: Awake/alert Behavior During Therapy: WFL for tasks assessed/performed Overall Cognitive Status: Within Functional Limits for tasks assessed                                     General Comments  HR 126.  Pt aware of recommendation to use RW at home and states she feels much more steady using it     Exercises     Shoulder Instructions      Home Living Family/patient expects to be discharged to:: Private residence Living Arrangements: Alone Available Help at Discharge: Family;Available PRN/intermittently Type of Home: House Home Access: Stairs to enter [employer] of Steps: 2 Entrance Stairs-Rails: Right Home Layout: Two level;Able to live on main level with  bedroom/bathroom Alternate Level Stairs-Number of Steps: 13   Bathroom Shower/Tub: Tub/shower unit;Curtain   Biochemist, clinical: Standard     Home Equipment: Environmental consultant - 2 wheels;Walker - standard;Shower seat;Grab bars - toilet;Grab bars - tub/shower   Additional Comments: Sister lives next door and available 24/7 if needed      Prior Functioning/Environment Level of Independence: Independent with assistive device(s)        Comments: Indep with intermittent use of RW or 'furniture surfing'        OT Problem List: Decreased activity tolerance      OT Treatment/Interventions:      OT Goals(Current goals can be found in the care plan section) Acute Rehab OT Goals Patient Stated Goal: to go home before Christmas  OT Goal Formulation: All assessment and education complete, DC therapy   OT Frequency:     Barriers to D/C:            Co-evaluation              AM-PAC PT "6 Clicks" Daily Activity     Outcome Measure Help from another person eating meals?: None Help from another person taking care of personal grooming?: None Help from another person toileting, which includes using toliet, bedpan, or urinal?: None Help from another person bathing (including washing, rinsing, drying)?: None Help from another person to put on and taking off regular upper body clothing?: None Help from another person to put on and taking off regular lower body clothing?: None 6 Click Score: 24   End of Session Equipment Utilized During Treatment: Rolling walker  Activity Tolerance: Patient tolerated treatment well Patient left: in bed;with call bell/phone within reach  OT Visit Diagnosis: Unsteadiness on feet (R26.81)                Time: 4081-4481 OT Time Calculation (min): 16 min Charges:  OT General Charges $OT Visit: 1 Visit OT Evaluation $OT Eval Low Complexity: 1 Low G-Codes: OT G-codes **NOT FOR INPATIENT CLASS** Functional Assessment Tool Used: AM-PAC 6 Clicks Daily Activity Functional Limitation: Self care Self Care Current Status (E5631): At least 1 percent but less than 20 percent impaired, limited or restricted Self Care Goal Status (S9702): At least 1 percent but less than 20 percent impaired, limited or restricted Self Care Discharge Status [phone]): At least 1 percent but less than 20 percent impaired, limited or restricted   [facility], OTR/L [phone]   Lucille Passy M 02/24/2017, 6:11 PM

## 2017-02-24 NOTE — Progress Notes (Signed)
  Echocardiogram 2D Echocardiogram has been performed.  Melinda [name] 02/24/2017, 12:23 PM

## 2017-02-24 NOTE — Progress Notes (Signed)
Occupational Therapy Note:  Pt seen by OT.  Education completed.  She is mod I with ADLs.   No follow up OT recommended. Full eval note to follow.   Lucille Passy, OTR/L [phone]

## 2017-02-24 NOTE — Progress Notes (Addendum)
PROGRESS NOTE    KAYLEN MOTL  ZWC:[id number] DOB: 13-Apr-1931 DOA: 02/23/2017 PCP: Crist Infante, MD    Brief Narrative: Melinda [name] is a 83 y.o. female with medical history significant for diabetes, GERD, CAD, atrial fibrillation, anxiety, osteoarthritis,osteoarthritis, brought to the ER with severe substernal chest pain radiating to the jaw, acute, starting around 10 PM, lasting for about one hour. She reports having similar episodes over the last 6 months, when the symptoms appeared to bedtime. She took a baby aspirin, and the symptoms improved. She did not receive nitroglycerin in her morphine in transport. She denies any new changes in her medications and she is compliant with him. She denies any pleuritic chest pain. She has been feeling increasingly weak, as well as as experiencing dyspnea on exertion in the setting of deconditioning. Patient denies any new herbal products, or hormonal meds. She denies any new stressors. No recent long distance trips. No recent infections. She denies any fever, chills or night sweats. She denies any diaphoresis with the above symptoms. She denied any palpitations. She denies any abdominal pain, nausea, or vomiting. She denies any lower extremity swelling or calf pain. Since admission to the ER, no further episodes were noted. Cardiology has been notified of the patient's admission, and is to consult this morning.     Assessment & Plan:   Active Problems:   Multifactorial gait disorder   Sleep apnea with use of continuous positive airway pressure (CPAP)   Adjustment disorder with mixed anxiety and depressed mood   OSA on CPAP   Ataxia   Falls   Osteoarthritis of right hip   Chest pain with moderate risk for cardiac etiology   Essential hypertension   Pulmonary arterial hypertension (HCC)   Hypercholesterolemia   Persistent atrial fibrillation (HCC)   Chest pain   GERD (gastroesophageal reflux disease)   Diabetes ([place])   Atypical  atrial flutter (HCC)   Prolonged Q-T interval on ECG   1-A fib RVR;  Plan to stop sotalol.  Continue toprol.  EP evaluation.   2-Chest pain, likely related to arrhythmia.  Denies chest pain this am.   CKD stage III, cr baseline 1.3.  Follow cr level.  Monitor on torsemide.   HTN; on metoprolol/  Hold norvasc to have more room in BP.   Diabetes type 2;  SSI.   Hyperlipidemia; continue statins.   Chronic gait disorder/Osteoarthritis/ deconditioning PT/OT Continue Voltaren  OSA Continue CPAP nightly  Constipation; linzess.    DVT prophylaxis: Eliquis.  Code Status: full code.  Family Communication: care discussed with patient.  Disposition Plan: to be determine.    Consultants:   Cardiology    Procedures: ECHO  Antimicrobials: none  Subjective: She denies chest pain, mild dyspnea.    Objective: Vitals:   02/23/17 1314 02/23/17 1407 02/23/17 2028 02/24/17 0446  BP:  (!) 141/86 (!) 145/101 (!) 146/94  Pulse:   (!) 119 (!) 116  Resp:  (!) 28 (!) 27 (!) 26  Temp: 98.4 F (36.9 C) 97.7 F (36.5 C) 98.1 F (36.7 C) 98.1 F (36.7 C)  TempSrc: Oral Oral Oral Oral  SpO2:      Weight:      Height:        Intake/Output Summary (Last 24 hours) at 02/24/2017 0909 Last data filed at 02/23/2017 1800 Gross per 24 hour  Intake 1000 ml  Output -  Net 1000 ml   Filed Weights   02/23/17 0053  Weight: 77.6 kg (171 lb)  Examination:  General exam: Appears calm and comfortable  Respiratory system: Clear to auscultation. Respiratory effort normal. Cardiovascular system: S1 & S2 heard, IRR. No JVD, murmurs, rubs, gallops or clicks. No pedal edema. Gastrointestinal system: Abdomen is nondistended, soft and nontender. No organomegaly or masses felt. Normal bowel sounds heard. Central nervous system: Alert and oriented. No focal neurological deficits. Extremities: Symmetric 5 x 5 power. Skin: No rashes, lesions or ulcers    Data Reviewed: I have  personally reviewed following labs and imaging studies  CBC: Recent Labs  Lab 02/23/17 0138  WBC 9.1  HGB 13.0  HCT 38.4  MCV 94.3  PLT 109   Basic Metabolic Panel: Recent Labs  Lab 02/23/17 0138  NA 135  K 4.5  CL 103  CO2 26  GLUCOSE 163*  BUN 32*  CREATININE 1.31*  CALCIUM 9.2   GFR: Estimated Creatinine Clearance: 29.9 mL/min (A) (by C-G formula based on SCr of 1.31 mg/dL (H)). Liver Function Tests: No results for input(s): AST, ALT, ALKPHOS, BILITOT, PROT, ALBUMIN in the last 168 hours. No results for input(s): LIPASE, AMYLASE in the last 168 hours. No results for input(s): AMMONIA in the last 168 hours. Coagulation Profile: No results for input(s): INR, PROTIME in the last 168 hours. Cardiac Enzymes: Recent Labs  Lab 02/23/17 0507 02/23/17 0807 02/23/17 1033  TROPONINI <0.03 <0.03 <0.03   BNP (last 3 results) No results for input(s): PROBNP in the last 8760 hours. HbA1C: Recent Labs    02/23/17 0807  HGBA1C 7.0*   CBG: Recent Labs  Lab 02/23/17 0954 02/23/17 1148 02/23/17 1719 02/23/17 2136 02/24/17 0617  GLUCAP 163* 162* 181* 156* 137*   Lipid Profile: Recent Labs    02/23/17 0807  CHOL 153  HDL 62  LDLCALC 64  TRIG 137  CHOLHDL 2.5   Thyroid Function Tests: No results for input(s): TSH, T4TOTAL, FREET4, T3FREE, THYROIDAB in the last 72 hours. Anemia Panel: No results for input(s): VITAMINB12, FOLATE, FERRITIN, TIBC, IRON, RETICCTPCT in the last 72 hours. Sepsis Labs: No results for input(s): PROCALCITON, LATICACIDVEN in the last 168 hours.  No results found for this or any previous visit (from the past 240 hour(s)).       Radiology Studies: Dg Chest 2 View  Result Date: 02/23/2017 CLINICAL DATA:  Chest pain EXAM: CHEST  2 VIEW COMPARISON:  Chest radiograph 09/04/2016 FINDINGS: Mild cardiomegaly. Unchanged elevation of the right hemidiaphragm. No focal airspace consolidation or pulmonary edema. No pleural effusion or  pneumothorax. IMPRESSION: No active cardiopulmonary disease. Electronically Signed   By: Ulyses Jarred M.D.   On: 02/23/2017 01:23        Scheduled Meds: . amLODipine  2.5 mg Oral Daily  . apixaban  5 mg Oral BID  . aspirin  325 mg Oral Daily  . atorvastatin  40 mg Oral Daily  . calcium-vitamin D  1 tablet Oral Q breakfast  . cholecalciferol  1,000 Units Oral Daily  . desipramine  25 mg Oral Daily  . diclofenac sodium  2 g Topical QID  . famotidine  20 mg Oral Daily  . insulin aspart  0-9 Units Subcutaneous TID WC  . linaclotide  145 mcg Oral QAC breakfast  . LORazepam  0.5 mg Oral QHS  . metoprolol succinate  25 mg Oral Daily  . potassium chloride  20 mEq Oral Daily  . torsemide  10 mg Oral Daily  . vitamin B-12  1,000 mcg Oral Daily   Continuous Infusions: . sodium chloride 75  mL/hr at 02/23/17 0440     LOS: 0 days    Time spent: [phone]    Elmarie Shiley, MD [facility] Pager [phone]  If 7PM-7AM, please contact night-coverage [email or web address] Password [email or web address] 02/24/2017, 9:09 AM

## 2017-02-25 ENCOUNTER — Other Ambulatory Visit: Payer: Self-pay | Admitting: Physician Assistant

## 2017-02-25 DIAGNOSIS — I484 Atypical atrial flutter: Secondary | ICD-10-CM

## 2017-02-25 LAB — GLUCOSE, CAPILLARY: Glucose-Capillary: 148 mg/dL — ABNORMAL HIGH (ref 65–99)

## 2017-02-25 LAB — BASIC METABOLIC PANEL WITH GFR
Anion gap: 6 (ref 5–15)
BUN: 32 mg/dL — ABNORMAL HIGH (ref 6–20)
CO2: 26 mmol/L (ref 22–32)
Calcium: 9.1 mg/dL (ref 8.9–10.3)
Chloride: 103 mmol/L (ref 101–111)
Creatinine, Ser: 0.96 mg/dL (ref 0.44–1.00)
GFR calc Af Amer: >60 mL/min
GFR calc non Af Amer: 53 mL/min — ABNORMAL LOW
Glucose, Bld: 165 mg/dL — ABNORMAL HIGH (ref 65–99)
Potassium: 4.5 mmol/L (ref 3.5–5.1)
Sodium: 135 mmol/L (ref 135–145)

## 2017-02-25 MED ORDER — AMIODARONE HCL 200 MG PO TABS: 200.0000 mg | ORAL_TABLET | Freq: Every day | ORAL | 0 refills | Status: DC

## 2017-02-25 MED ORDER — METOPROLOL SUCCINATE ER 25 MG PO TB24: 25.0000 mg | ORAL_TABLET | Freq: Two times a day (BID) | ORAL | 0 refills | Status: DC

## 2017-02-25 NOTE — Progress Notes (Signed)
Progress Note  Patient Name: Melinda [name] Date of Encounter: 02/25/2017  Primary Cardiologist: [place]  Subjective   No chest pressure. Denies sob. No problems with initial amiodarone dose  Inpatient Medications    Scheduled Meds: . amiodarone  200 mg Oral Daily  . apixaban  5 mg Oral BID  . aspirin  325 mg Oral Daily  . atorvastatin  40 mg Oral Daily  . calcium-vitamin D  1 tablet Oral Q breakfast  . cholecalciferol  1,000 Units Oral Daily  . desipramine  25 mg Oral Daily  . diclofenac sodium  2 g Topical QID  . famotidine  20 mg Oral Daily  . insulin aspart  0-9 Units Subcutaneous TID WC  . linaclotide  145 mcg Oral QAC breakfast  . LORazepam  0.5 mg Oral QHS  . metoprolol succinate  25 mg Oral BID  . polyethylene glycol  17 g Oral BID  . potassium chloride  20 mEq Oral Daily  . torsemide  10 mg Oral Daily  . vitamin B-12  1,000 mcg Oral Daily   Continuous Infusions:  PRN Meds: alum & mag hydroxide-simeth, morphine injection, polyvinyl alcohol   Vital Signs    Vitals:   02/24/17 1326 02/24/17 2010 02/24/17 2119 02/25/17 0420  BP: 122/83  123/72 111/84  Pulse: 96 (!) 124 (!) 125 (!) 117  Resp: (!) 28     Temp: (!) 97.5 F (36.4 C) 97.8 F (36.6 C)  98.2 F (36.8 C)  TempSrc: Oral Oral  Oral  SpO2:      Weight:      Height:        Intake/Output Summary (Last 24 hours) at 02/25/2017 0750 Last data filed at 02/24/2017 1900 Gross per 24 hour  Intake 360 ml  Output -  Net 360 ml   Filed Weights   02/23/17 0053  Weight: 171 lb (77.6 kg)    Telemetry    Atypical atrial flutter with 2:1 AV conduction - Personally Reviewed  ECG    none - Personally Reviewed  Physical Exam   GEN: No acute distress.   Neck: 6 cm JVD Cardiac: RRR, no murmurs, rubs, or gallops.  Respiratory: Clear to auscultation bilaterally. GI: Soft, nontender, non-distended  MS: No edema; No deformity. Neuro:  Nonfocal  Psych: Normal affect   Labs     Chemistry Recent Labs  Lab 02/23/17 0138 02/24/17 1541 02/25/17 0244  NA 135 135 135  K 4.5 4.7 4.5  CL 103 99* 103  CO2 26 28 26   GLUCOSE 163* 155* 165*  BUN 32* 27* 32*  CREATININE 1.31* 1.21* 0.96  CALCIUM 9.2 9.3 9.1  GFRNONAA 37* 40* 53*  GFRAA 42* 47* >60  ANIONGAP 6 8 6      Hematology Recent Labs  Lab 02/23/17 0138 02/24/17 1541  WBC 9.1 10.2  RBC 4.07 4.47  HGB 13.0 14.3  HCT 38.4 42.8  MCV 94.3 95.7  MCH 31.9 32.0  MCHC 33.9 33.4  RDW 13.2 13.6  PLT 181 215    Cardiac Enzymes Recent Labs  Lab 02/23/17 0507 02/23/17 0807 02/23/17 1033  TROPONINI <0.03 <0.03 <0.03    Recent Labs  Lab 02/23/17 0150  TROPIPOC 0.01     BNPNo results for input(s): BNP, PROBNP in the last 168 hours.   DDimer  Recent Labs  Lab 02/23/17 1408  DDIMER <0.27     Radiology    No results found.  Cardiac Studies     Patient Profile  83 y.o. female admitted with chest fullness and atrial flutter with a RVR  Assessment & Plan    1. Atypical atrial flutter - she has been started on amiodarone. We will plan to have her return in about 10 days for an ECG. She will take 200 mg twice a day. I will see her in 3-4 weeks with plans to pursue DCCV if she has not reverted back to NSR on her own.  2. Chest pressure - resolved 3. Sob - resolved 4. Disp. - ok for DC home from my perspective with plan as above.    For questions or updates, please contact [place] Please consult [email or web address] for contact info under Cardiology/STEMI.      Signed, [name], MD  02/25/2017, 7:50 AM  Patient ID: Melinda [name], female   DOB: [birth year], 83 y.o.   MRN: [id number]

## 2017-02-25 NOTE — Discharge Summary (Signed)
Physician Discharge Summary  Melinda [name] JTT:[id number] DOB: 06/30/1931 DOA: 02/23/2017  PCP: Melinda Infante, MD  Admit date: 02/23/2017 Discharge date: 02/25/2017  Admitted From: Home  Disposition: home   Recommendations for Outpatient Follow-up:  1. Follow up with PCP in 1-2 weeks 2. Please obtain BMP/CBC in one week 3. Follow up with cardiology for further care of a fib.    Discharge Condition: stable.  CODE STATUS: full code.  Diet recommendation: Heart Healthy  Brief/Interim Summary: Melinda [name] a 83 y.o.femalewith medical history significant fordiabetes, GERD, CAD, atrial fibrillation,anxiety, osteoarthritis,osteoarthritis,brought to the ER with severe substernal chest pain radiating to the jaw, acute, starting around 10 PM, lasting for about one hour.She reports having similar episodes over the last 6 months, when the symptoms appeared to bedtime. She took a baby aspirin, and the symptoms improved. She did not receive nitroglycerin in her morphine in transport. She denies any new changes in her medications and she is compliant with him. She denies any pleuritic chest pain. She has been feeling increasingly weak, as well as as experiencing dyspnea on exertion in the setting of deconditioning. Patient denies any new herbal products, or hormonal meds. She denies any new stressors. No recent long distance trips. No recent infections. She denies any fever, chills or night sweats. She denies any diaphoresis with the above symptoms. She denied any palpitations. She denies any abdominal pain,nausea, or vomiting. She denies any lower extremity swelling or calf pain. Since admission to the ER, no further episodes were noted. Cardiology has been notified of the patient's admission, and is to consult this morning.     Assessment & Plan:   Active Problems:   Multifactorial gait disorder   Sleep apnea with use of continuous positive airway pressure (CPAP)  Adjustment disorder with mixed anxiety and depressed mood   OSA on CPAP   Ataxia   Falls   Osteoarthritis of right hip   Chest pain with moderate risk for cardiac etiology   Essential hypertension   Pulmonary arterial hypertension (HCC)   Hypercholesterolemia   Persistent atrial fibrillation (HCC)   Chest pain   GERD (gastroesophageal reflux disease)   Diabetes ([place])   Atypical atrial flutter (HCC)   Prolonged Q-T interval on ECG   1-A fib RVR;  Plan to stop sotalol.  Continue toprol.  EP evaluation. Started on amiodarone. Metoprolol increase. HR better controlled. Chest pain free. Ok to discharge home today per EP.   2-Chest pain, likely related to arrhythmia.  Denies chest pain this am.   CKD stage III, cr baseline 1.3.  Follow cr level.  Monitor on torsemide.   HTN; on metoprolol/  Discontinue norvasc.   Diabetes type 2;  SSI.  Resume meds  Hyperlipidemia; continue statins.   Chronic gait disorder/Osteoarthritis/deconditioning PT/OT Continue Voltaren  OSA Continue CPAP nightly  Constipation; linzess. Resolved.      Discharge Diagnoses:  Active Problems:   Multifactorial gait disorder   Sleep apnea with use of continuous positive airway pressure (CPAP)   Adjustment disorder with mixed anxiety and depressed mood   OSA on CPAP   Ataxia   Falls   Osteoarthritis of right hip   Chest pain with moderate risk for cardiac etiology   Essential hypertension   Pulmonary arterial hypertension (HCC)   Hypercholesterolemia   Persistent atrial fibrillation (HCC)   Chest pain   GERD (gastroesophageal reflux disease)   Diabetes ([place])   Atypical atrial flutter (HCC)   Prolonged Q-T interval on ECG  Uncontrolled atrial fibrillation [facility])    Discharge Instructions  Discharge Instructions    Diet - low sodium heart healthy   Complete by:  As directed    Increase activity slowly   Complete by:  As directed      Allergies as of 02/25/2017       Reactions   Lasix [furosemide] Itching   Phenobarbital Itching   Amitiza [lubiprostone] Itching      Medication List    STOP taking these medications   amLODipine 5 MG tablet Commonly known as:  NORVASC   sotalol 160 MG tablet Commonly known as:  BETAPACE     TAKE these medications   acetaminophen 650 MG CR tablet Commonly known as:  TYLENOL Take 650-1,300 mg by mouth 2 (two) times daily as needed for pain (arthritis pain).   alum & mag hydroxide-simeth 200-200-20 MG/5ML suspension Commonly known as:  MAALOX/MYLANTA Take 15 mLs by mouth every 6 (six) hours as needed for indigestion or heartburn.   amiodarone 200 MG tablet Commonly known as:  PACERONE Take 1 tablet (200 mg total) by mouth daily. Start taking on:  02/26/2017   apixaban 5 MG Tabs tablet Commonly known as:  ELIQUIS Take 1 tablet (5 mg total) by mouth 2 (two) times daily.   atorvastatin 40 MG tablet Commonly known as:  LIPITOR Take 40 mg by mouth daily.   CALCIUM 600+D3 600-400 MG-UNIT Tabs Generic drug:  Calcium Carbonate-Vitamin D3 Take 1 tablet by mouth daily.   cholecalciferol 1000 units tablet Commonly known as:  VITAMIN D Take 1,000 Units by mouth daily.   desipramine 25 MG tablet Commonly known as:  NORPRAMIN Take 25 mg by mouth daily.   diclofenac sodium 1 % Gel Commonly known as:  VOLTAREN Apply 2 g topically 4 (four) times daily.   famotidine 20 MG tablet Commonly known as:  PEPCID Take 20 mg by mouth daily.   glucosamine-chondroitin 500-400 MG tablet Take 1 tablet by mouth every other day.   KLOR-CON 10 10 MEQ tablet Generic drug:  potassium chloride TAKE 2 TABLETS (20 MEQ TOTAL) BY MOUTH DAILY.   linaclotide 145 MCG Caps capsule Commonly known as:  LINZESS Take 145 mcg by mouth daily before breakfast.   LORazepam 0.5 MG tablet Commonly known as:  ATIVAN Take 0.5 mg by mouth at bedtime.   metoprolol succinate 25 MG 24 hr tablet Commonly known as:  TOPROL-XL Take 1  tablet (25 mg total) by mouth 2 (two) times daily. What changed:    how much to take  when to take this   OCUVITE PO Take 1 tablet by mouth daily.   polyethylene glycol packet Commonly known as:  MIRALAX / GLYCOLAX Take 17 g by mouth daily. Mix in 8 oz liquid and drink   PRESCRIPTION MEDICATION Inhale into the lungs at bedtime. Uses CPAP with oxygen   SYSTANE 0.4-0.3 % Gel ophthalmic gel Generic drug:  Polyethyl Glycol-Propyl Glycol Place 1 application into both eyes at bedtime as needed (dry eyes).   torsemide 10 MG tablet Commonly known as:  DEMADEX Take 1 tablet (10 mg total) by mouth daily. What changed:    how much to take  when to take this   vitamin B-12 1000 MCG tablet Commonly known as:  CYANOCOBALAMIN Take 1,000 mcg by mouth daily.       Allergies  Allergen Reactions  . Lasix [Furosemide] Itching  . Phenobarbital Itching  . Amitiza [Lubiprostone] Itching    Consultations:  Cardiology  Procedures/Studies: Dg Chest 2 View  Result Date: 02/23/2017 CLINICAL DATA:  Chest pain EXAM: CHEST  2 VIEW COMPARISON:  Chest radiograph 09/04/2016 FINDINGS: Mild cardiomegaly. Unchanged elevation of the right hemidiaphragm. No focal airspace consolidation or pulmonary edema. No pleural effusion or pneumothorax. IMPRESSION: No active cardiopulmonary disease. Electronically Signed   By: Ulyses Jarred M.D.   On: 02/23/2017 01:23   Xr C-arm No Report  Result Date: 02/16/2017 Please see Notes or Procedures tab for imaging impression.  Xr Pelvis 1-2 Views  Result Date: 02/01/2017 Stable right total hip replacement without complication.   (Echo, Carotid, EGD, Colonoscopy, ERCP)    Subjective:   Discharge Exam: Vitals:   02/25/17 0420 02/25/17 0824  BP: 111/84 120/81  Pulse: (!) 117 72  Resp:  (!) 21  Temp: 98.2 F (36.8 C) (!) 97.5 F (36.4 C)  SpO2:     Vitals:   02/24/17 2010 02/24/17 2119 02/25/17 0420 02/25/17 0824  BP:  123/72 111/84 120/81   Pulse: (!) 124 (!) 125 (!) 117 72  Resp:    (!) 21  Temp: 97.8 F (36.6 C)  98.2 F (36.8 C) (!) 97.5 F (36.4 C)  TempSrc: Oral  Oral Oral  SpO2:      Weight:      Height:        General: Pt is alert, awake, not in acute distress Cardiovascular: RRR, S1/S2 +, no rubs, no gallops Respiratory: CTA bilaterally, no wheezing, no rhonchi Abdominal: Soft, NT, ND, bowel sounds + Extremities: no edema, no cyanosis    The results of significant diagnostics from this hospitalization (including imaging, microbiology, ancillary and laboratory) are listed below for reference.     Microbiology: No results found for this or any previous visit (from the past 240 hour(s)).   Labs: BNP (last 3 results) Recent Labs    02/29/16 1817  BNP 704.8*   Basic Metabolic Panel: Recent Labs  Lab 02/23/17 0138 02/24/17 1541 02/25/17 0244  NA 135 135 135  K 4.5 4.7 4.5  CL 103 99* 103  CO2 26 28 26   GLUCOSE 163* 155* 165*  BUN 32* 27* 32*  CREATININE 1.31* 1.21* 0.96  CALCIUM 9.2 9.3 9.1   Liver Function Tests: No results for input(s): AST, ALT, ALKPHOS, BILITOT, PROT, ALBUMIN in the last 168 hours. No results for input(s): LIPASE, AMYLASE in the last 168 hours. No results for input(s): AMMONIA in the last 168 hours. CBC: Recent Labs  Lab 02/23/17 0138 02/24/17 1541  WBC 9.1 10.2  HGB 13.0 14.3  HCT 38.4 42.8  MCV 94.3 95.7  PLT 181 215   Cardiac Enzymes: Recent Labs  Lab 02/23/17 0507 02/23/17 0807 02/23/17 1033  TROPONINI <0.03 <0.03 <0.03   BNP: Invalid input(s): POCBNP CBG: Recent Labs  Lab 02/24/17 0617 02/24/17 1141 02/24/17 1633 02/24/17 2014 02/25/17 0601  GLUCAP 137* 145* 174* 200* 148*   D-Dimer Recent Labs    02/23/17 1408  DDIMER <0.27   Hgb A1c Recent Labs    02/23/17 0807  HGBA1C 7.0*   Lipid Profile Recent Labs    02/23/17 0807  CHOL 153  HDL 62  LDLCALC 64  TRIG 137  CHOLHDL 2.5   Thyroid function studies No results for  input(s): TSH, T4TOTAL, T3FREE, THYROIDAB in the last 72 hours.  Invalid input(s): FREET3 Anemia work up No results for input(s): VITAMINB12, FOLATE, FERRITIN, TIBC, IRON, RETICCTPCT in the last 72 hours. Urinalysis    Component Value Date/Time   COLORURINE YELLOW  09/04/2016 1222   APPEARANCEUR CLOUDY (A) 09/04/2016 1222   LABSPEC 1.012 09/04/2016 1222   PHURINE 6.0 09/04/2016 1222   GLUCOSEU NEGATIVE 09/04/2016 1222   HGBUR NEGATIVE 09/04/2016 1222   BILIRUBINUR NEGATIVE 09/04/2016 1222   KETONESUR NEGATIVE 09/04/2016 1222   PROTEINUR 30 (A) 09/04/2016 1222   NITRITE NEGATIVE 09/04/2016 1222   LEUKOCYTESUR LARGE (A) 09/04/2016 1222   Sepsis Labs Invalid input(s): PROCALCITONIN,  WBC,  LACTICIDVEN Microbiology No results found for this or any previous visit (from the past 240 hour(s)).   Time coordinating discharge: Over 30 minutes  SIGNED:   Elmarie Shiley, MD  [facility] 02/25/2017, 9:14 AM Pager   If 7PM-7AM, please contact night-coverage [email or web address] Password [email or web address]

## 2017-02-25 NOTE — Progress Notes (Signed)
Discharge instructions (including medications) discussed with and copy provided to patient/caregiver 

## 2017-03-02 ENCOUNTER — Ambulatory Visit (INDEPENDENT_AMBULATORY_CARE_PROVIDER_SITE_OTHER): Payer: Medicare HMO | Admitting: Rehabilitative and Restorative Service Providers"

## 2017-03-02 ENCOUNTER — Encounter: Payer: Self-pay | Admitting: Rehabilitative and Restorative Service Providers"

## 2017-03-02 DIAGNOSIS — M25561 Pain in right knee: Secondary | ICD-10-CM

## 2017-03-02 DIAGNOSIS — M25551 Pain in right hip: Secondary | ICD-10-CM | POA: Diagnosis not present

## 2017-03-02 DIAGNOSIS — R29898 Other symptoms and signs involving the musculoskeletal system: Secondary | ICD-10-CM | POA: Diagnosis not present

## 2017-03-02 NOTE — Therapy (Signed)
[place], [name], [address] Phone: [phone]   Fax:  [phone]  Physical Therapy Treatment  Patient Details  Name: Melinda [name] MRN: [id number] Date of Birth: 11-Jan-1932 Referring Provider: Dr Frankey Shown   Encounter Date: 03/02/2017  PT End of Session - 03/02/17 1403    Visit Number  5    Number of Visits  12    Date for PT Re-Evaluation  03/13/17    PT Start Time  6962    PT Stop Time  1500    PT Time Calculation (min)  56 min    Activity Tolerance  Patient tolerated treatment well       Past Medical History:  Diagnosis Date  . A-fib ([place])   . Acid reflux   . Anxiety   . Arthritis   . Cataract    surgery,bilateral  . Constipation   . Depression   . Diabetes mellitus without complication (HCC)    Pre diabetes  . Dyspnea on exertion    a. 07/2015 Echo: EF 55-60%, no rwma, mild MR, mod dil LA/RA/RV, mod TR, triv PR, PASP 12mHg.  . Falls   . Family history of adverse reaction to anesthesia    mother always had n/v  . Hx: UTI (urinary tract infection)   . Hypercholesterolemia   . Hypertensive heart disease   . Insomnia   . Multifactorial gait disorder   . Non-obstructive CAD    a. 07/2015 Cath: LM 30-40d, LAD min irregs, LCX min irregs, RCA 30p, RA 17, RV 33/7, PA 42/19, PCWP 17.  . On home oxygen therapy    "2L w/CPAP at night" (02/24/2017)  . Osteoarthritis    a. 06/2015 s/p R total hip arthroplasty.  .Marland KitchenPAF (paroxysmal atrial fibrillation) ([place]    a. Dx 07/2015, CHA2DS2VASc = 6-->Eliquis.  . Premature atrial contractions   . Pulmonary hypertension ([place]    a. 07/2015 Echo: PASP 517mg.  . Marland Kitchenyloric antral stenosis    in childhood,infancy  . Sleep apnea with use of continuous positive airway pressure (CPAP) 10/19/2012   a. compliant w/ CPAP.    Past Surgical History:  Procedure Laterality Date  . ABDOMINAL HYSTERECTOMY  1970  . ABDOMINAL SURGERY     as a 11 18ay old  baby  . CARDIAC CATHETERIZATION N/A 07/14/2015   Procedure: Right/Left Heart Cath and Coronary Angiography;  Surgeon: MiSanda KleinMD;  [facility];  Service: Cardiovascular;  Laterality: N/A;  . CARDIOVERSION N/A 11/04/2015   Procedure: CARDIOVERSION;  Surgeon: BrLelon PerlaMD;  [facility];  Service: Cardiovascular;  Laterality: N/A;  . CARDIOVERSION N/A 02/25/2016   Procedure: CARDIOVERSION;  Surgeon: KePixie CasinoMD;  [facility];  Service: Cardiovascular;  Laterality: N/A;  . CARDIOVERSION N/A 12/13/2016   Procedure: CARDIOVERSION;  Surgeon: RoFay RecordsMD;  [facility] Service: Cardiovascular;  Laterality: N/A;  . COLONOSCOPY    . EYE SURGERY Bilateral    cataract surgery with lens implant  . INNER EAR SURGERY Left    x 3  . JOINT REPLACEMENT Right   . LAPAROSCOPIC SALPINGO OOPHERECTOMY    . NM MYOVIEW LTD  11/30/2007   normal stress nuclear study/EF-83%  . TONSILLECTOMY    . TOTAL HIP ARTHROPLASTY Right 06/26/2015   Procedure: RIGHT TOTAL HIP ARTHROPLASTY ANTERIOR APPROACH;  Surgeon: NaLeandrew KoyanagiMD;  [facility]: [facility] Service: Orthopedics;  Laterality: Right;  . TRANSTHORACIC  ECHOCARDIOGRAM  07/23/2009   mild concentric LVH/mild mitral insufficiency/ moderate tricuspid innsufficiency/mild pulmonary HTN/EF- 55-60%  . VAGINAL DELIVERY      There were no vitals filed for this visit.  Subjective Assessment - 03/02/17 1406    Subjective  Injection last week really seems to have helped. She is not having pain in the hip and knee now. Pleaesd with progress. not sure that her strength is where she wants it to be. She has been in the hospital last week for a few days with work up of heart problems - racing heart. She has started on medication to regulate her heart rate. She reports HR at 118 prior to coming in for therapy.     Currently in Pain?  No/denies         [facility] PT Assessment - 03/02/17 0001      Assessment    Medical Diagnosis  Rt IT band symdrome; Rt psoas tendinitis     Referring Provider  Dr Frankey Shown    Onset Date/Surgical Date  12/06/16    Hand Dominance  Right    Next MD Visit  06/23/17      Strength   Right Hip Flexion  3+/5    Right Hip Extension  4-/5    Right Hip ABduction  4/5      Palpation   Palpation comment  improving tightness Rt psoas and hip flexors; Rt lateral thigh at knee and proximal fibula; posterior hamstrings/proximal calf                   [facility] Adult PT Treatment/Exercise - 03/02/17 0001      Knee/Hip Exercises: Aerobic   Nustep  L5: 6 min  HR 118      Knee/Hip Exercises: Standing   Hip Abduction  AROM;Right;Left;1 set;10 reps;Knee straight    Hip Extension  AROM;Right;Left;1 set;10 reps;Knee straight    Extension Limitations  with UE support; VC for posture and form.     Other Standing Knee Exercises  added standing balance activities - standing to balance 60 sec; standing feet together 60 sec; standing one foot frw 60 sec other ft fwd 60 sec       Knee/Hip Exercises: Seated   Sit to Sand  10 reps;without UE support      Knee/Hip Exercises: Supine   Bridges  Both;1 set;10 reps    Other Supine Knee/Hip Exercises  hip abduction holding one leg still and moving the opposite in hooklying green TB 20 reps each side       Knee/Hip Exercises: Sidelying   Hip ABduction  AROM;Strengthening;Right;10 reps             PT Education - 03/02/17 1442    Education provided  Yes    Education Details  HEP     Person(s) Educated  Patient    Methods  Explanation;Demonstration;Tactile cues;Verbal cues;Handout    Comprehension  Verbalized understanding;Returned demonstration;Verbal cues required          PT Long Term Goals - 03/02/17 1406      PT LONG TERM GOAL #1   Title  Improve tightness to palpation through the Rt hip flexors and IT band with patient reporting no to little pain with palpation 03/23/17    Time  6    Period  Weeks    Status   Partially Met      PT LONG TERM GOAL #2   Title  increase Rt hip strength to 4+/4 to 5-/5 allowing  patient to get in and out of bed with greater ease and less pain 03/23/17    Time  6    Period  Weeks    Status  On-going      PT LONG TERM GOAL #3   Title  decrease pain Rt hip and Rt knee by 50-75% allowing patient to preform functional activities with less pain and greater ease and safety 03/23/17    Time  6    Period  Weeks    Status  On-going      PT LONG TERM GOAL #4   Title  Independent in HEP 03/23/17    Time  6    Period  Weeks    Status  On-going      PT LONG TERM GOAL #5   Title  Improve FOTO to </= 49% limtation /16/19    Time  6    Period  Weeks    Status  On-going            Plan - 03/02/17 1415    Clinical Impression Statement  Patient reports good resolution of Rt hip and knee pain following the injection last week. Strength is increasing Rt hip but remains decreased. Patient was hospitalized last week for several days to evaluate rapid HR. She is undergoing treatment from her cardiologist and OK'ed to resume normal activities. Checking HR during treatment it is running 103 to 118. Melinda [name] is progressing well toward goals of therapy.      Rehab Potential  Good    PT Frequency  2x / week    PT Duration  6 weeks    PT Treatment/Interventions  Patient/family education;ADLs/Self Care Home Management;Cryotherapy;Electrical Stimulation;Iontophoresis 65m/ml Dexamethasone;Moist Heat;Ultrasound;Dry needling;Manual techniques;Therapeutic activities;Therapeutic exercise    PT Next Visit Plan  continue manual therapy to Rt hip/thigh prn ; progress HEP as tolerated. Hold resisted hip flexion progress with strengthening hip abduction and extension. Continue wit hmanual work anterior hip, hamstring and calf stretches.     Consulted and Agree with Plan of Care  Patient       Patient will benefit from skilled therapeutic intervention in order to improve the following deficits and  impairments:  Postural dysfunction, Improper body mechanics, Increased fascial restricitons, Increased muscle spasms, Decreased strength, Decreased mobility, Decreased range of motion, Decreased activity tolerance, Abnormal gait  Visit Diagnosis: Pain in right hip  Acute pain of right knee  Other symptoms and signs involving the musculoskeletal system     Problem List Patient Active Problem List   Diagnosis Date Noted  . Uncontrolled atrial fibrillation ([place] 02/24/2017  . GERD (gastroesophageal reflux disease) 02/23/2017  . Diabetes ([place] 02/23/2017  . Atypical atrial flutter (HG[place]   . Prolonged Q-T interval on ECG   . Pain in right hip 06/22/2016  . Encounter for monitoring sotalol therapy 05/31/2016  . Chest pain   . CHF exacerbation ([place] 02/29/2016  . Acute respiratory failure with hypoxia (HD[place] 02/29/2016  . Persistent atrial fibrillation ([place]   . Hypertensive heart disease   . Hypercholesterolemia   . Pulmonary hypertension ([place]   . Dyspnea on exertion   . Non-obstructive CAD   . Chest pain, moderate coronary artery risk 07/14/2015  . Pain in the chest   . PAF (paroxysmal atrial fibrillation) (HCC)   . Pulmonary arterial hypertension ([place]   . Chest pain with moderate risk for cardiac etiology 07/08/2015  . Dyspnea 07/08/2015  . Essential hypertension 07/08/2015  . Hyperlipidemia 07/08/2015  . Osteoarthritis of right hip  06/26/2015  . Hip joint replacement status 06/26/2015  . OSA on CPAP 03/27/2015  . Ataxia 03/27/2015  . Falls 03/27/2015  . Insomnia, controlled 03/27/2015  . Insomnia with sleep apnea 03/22/2014  . Adjustment disorder with mixed anxiety and depressed mood 03/22/2014  . Sleep apnea with use of continuous positive airway pressure (CPAP) 10/19/2012  . Multifactorial gait disorder     Donnovan Stamour Nilda Simmer PT, MPH  03/02/2017, 2:45 PM  [facility] [place], [name],  [address] Phone: [phone]   Fax:  [phone]  Name: PANHIA KARL MRN: [id number] Date of Birth: 07-Dec-1931

## 2017-03-02 NOTE — Patient Instructions (Addendum)
Sitting safely  Find an object to focus on 5-6 feet from where you are sitting.  Keep eyes fixed on the object and move your head side to side slowly  30-60 sec   Standing with feet together stand and balance for 1-2 minutes  3-4 reps  Standing with one foot slightly in front of the other Hold for 1-2 min Switch legs each side alternating forward feet  Repeat 2-3 times on each leg   Strengthening: Hip Abductor - Resisted    With band looped around both legs above knees, hold one knee still and move the opposite leg out to the side.  Repeat _10___ times per set. Do __2-3__ sets per session. Do __1-2__ sessions per day. Alternate every 10 reps     Strengthening: Hip Abduction (Side-Lying)    Tighten muscles on front of left thigh, then lift leg leading up with heel  __10-12__ inches from surface, keeping knee locked.  Repeat _10___ times per set. Do __2-3__ sets per session. Do __2__ sessions per day.   SIT TO STAND: No Device    Sit with feet shoulder-width apart, on floor. Lean chest forward, raise hips up from surface. Straighten hips and knees. Weight bear equally on left and right sides. __10_ reps per set, __2-3_ sets per day,

## 2017-03-03 DIAGNOSIS — R0902 Hypoxemia: Secondary | ICD-10-CM | POA: Diagnosis not present

## 2017-03-03 DIAGNOSIS — G4733 Obstructive sleep apnea (adult) (pediatric): Secondary | ICD-10-CM | POA: Diagnosis not present

## 2017-03-03 DIAGNOSIS — I5043 Acute on chronic combined systolic (congestive) and diastolic (congestive) heart failure: Secondary | ICD-10-CM | POA: Diagnosis not present

## 2017-03-04 ENCOUNTER — Ambulatory Visit (INDEPENDENT_AMBULATORY_CARE_PROVIDER_SITE_OTHER): Payer: Medicare HMO

## 2017-03-04 ENCOUNTER — Encounter: Payer: Medicare HMO | Admitting: Physical Therapy

## 2017-03-04 VITALS — BP 128/70 | HR 114 | Resp 16 | Ht 60.0 in | Wt 166.8 lb

## 2017-03-04 DIAGNOSIS — I482 Chronic atrial fibrillation, unspecified: Secondary | ICD-10-CM

## 2017-03-04 MED ORDER — AMIODARONE HCL 200 MG PO TABS: 200.0000 mg | ORAL_TABLET | Freq: Two times a day (BID) | ORAL | 3 refills | Status: DC

## 2017-03-04 MED ORDER — METOPROLOL SUCCINATE ER 25 MG PO TB24: 37.5000 mg | ORAL_TABLET | Freq: Two times a day (BID) | ORAL | 3 refills | Status: DC

## 2017-03-04 NOTE — Patient Instructions (Signed)
Medication Instructions:  Your physician has recommended you make the following change in your medication:   INCREASE: Amiodarone 200 mg two times a day  INCREASE: metoprolol succinate 37.5 mg two times a day   Labwork: None ordered   Testing/Procedures: You had an EKG done today   Follow-Up: You have a follow up appointment with Dr. Lovena Le on 03/21/17.    Any Other Special Instructions Will Be Listed Below (If Applicable).     If you need a refill on your cardiac medications before your next appointment, please call your pharmacy.

## 2017-03-04 NOTE — Progress Notes (Signed)
1.) Reason for visit: follow up EKG  2.) Name of MD requesting visit: Tommye Standard, PA  3.) H&P: Patient admitted to [facility] on 12/19 for substernal chest pain. Patient has a h/o diabetes, GERD, CAD and afib.    4.) ROS related to problem: Patient presented alone today for EKG in NAD. Patient c/o of some dizziness that she feels is a little worse this morning than usual. Vital signs are stable. Patient was discharged from hospital on Amiodarone 200 mg once a day.   5.) Assessment and plan per MD: Vital signs were stable. HR 117, EKG reviewed with Dr. Burt Knack (DOD). Per Dr. Burt Knack increased amiodarone to 200 mg BID and Toprol to 37.5 mg BID. Patient in agreement with plan. Patient instructed to monitor BP. Patient is scheduled for follow up appt with Dr. Lovena Le on 03/21/17. Patient left in NAD.

## 2017-03-10 ENCOUNTER — Ambulatory Visit (INDEPENDENT_AMBULATORY_CARE_PROVIDER_SITE_OTHER): Payer: Medicare HMO | Admitting: Physical Therapy

## 2017-03-10 VITALS — HR 104

## 2017-03-10 DIAGNOSIS — M25561 Pain in right knee: Secondary | ICD-10-CM | POA: Diagnosis not present

## 2017-03-10 DIAGNOSIS — R29898 Other symptoms and signs involving the musculoskeletal system: Secondary | ICD-10-CM | POA: Diagnosis not present

## 2017-03-10 DIAGNOSIS — I2510 Atherosclerotic heart disease of native coronary artery without angina pectoris: Secondary | ICD-10-CM | POA: Diagnosis not present

## 2017-03-10 DIAGNOSIS — Z6832 Body mass index (BMI) 32.0-32.9, adult: Secondary | ICD-10-CM | POA: Diagnosis not present

## 2017-03-10 DIAGNOSIS — I480 Paroxysmal atrial fibrillation: Secondary | ICD-10-CM | POA: Diagnosis not present

## 2017-03-10 DIAGNOSIS — E119 Type 2 diabetes mellitus without complications: Secondary | ICD-10-CM | POA: Diagnosis not present

## 2017-03-10 DIAGNOSIS — M25551 Pain in right hip: Secondary | ICD-10-CM | POA: Diagnosis not present

## 2017-03-10 DIAGNOSIS — I10 Essential (primary) hypertension: Secondary | ICD-10-CM | POA: Diagnosis not present

## 2017-03-10 DIAGNOSIS — R69 Illness, unspecified: Secondary | ICD-10-CM | POA: Diagnosis not present

## 2017-03-10 NOTE — Therapy (Addendum)
[name], [name], [address] Phone: [phone]   Fax:  [phone]  Physical Therapy Treatment  Patient Details  Name: Melinda [name] MRN: [id number] Date of Birth: [birth year] Referring Provider: Dr. Frankey Shown    Encounter Date: 03/10/2017  PT End of Session - 03/10/17 1405    Visit Number  6    Number of Visits  12    Date for PT Re-Evaluation  03/13/17    PT Start Time  1505    PT Stop Time  1501    PT Time Calculation (min)  59 min    Activity Tolerance  Patient tolerated treatment well;Patient limited by fatigue    Behavior During Therapy  [facility] for tasks assessed/performed       Past Medical History:  Diagnosis Date  . A-fib ([place])   . Acid reflux   . Anxiety   . Arthritis   . Cataract    surgery,bilateral  . Constipation   . Depression   . Diabetes mellitus without complication (HCC)    Pre diabetes  . Dyspnea on exertion    a. 07/2015 Echo: EF 55-60%, no rwma, mild MR, mod dil LA/RA/RV, mod TR, triv PR, PASP 64mHg.  . Falls   . Family history of adverse reaction to anesthesia    mother always had n/v  . Hx: UTI (urinary tract infection)   . Hypercholesterolemia   . Hypertensive heart disease   . Insomnia   . Multifactorial gait disorder   . Non-obstructive CAD    a. 07/2015 Cath: LM 30-40d, LAD min irregs, LCX min irregs, RCA 30p, RA 17, RV 33/7, PA 42/19, PCWP 17.  . On home oxygen therapy    "2L w/CPAP at night" (02/24/2017)  . Osteoarthritis    a. 06/2015 s/p R total hip arthroplasty.  .Marland KitchenPAF (paroxysmal atrial fibrillation) ([place]    a. Dx 07/2015, CHA2DS2VASc = 6-->Eliquis.  . Premature atrial contractions   . Pulmonary hypertension ([place]    a. 07/2015 Echo: PASP 511mg.  . Marland Kitchenyloric antral stenosis    in childhood,infancy  . Sleep apnea with use of continuous positive airway pressure (CPAP) 10/19/2012   a. compliant w/ CPAP.    Past Surgical History:  Procedure  Laterality Date  . ABDOMINAL HYSTERECTOMY  1970  . ABDOMINAL SURGERY     as a [age]ay old baby  . CARDIAC CATHETERIZATION N/A 07/14/2015   Procedure: Right/Left Heart Cath and Coronary Angiography;  Surgeon: MiSanda KleinMD;  [facility];  Service: Cardiovascular;  Laterality: N/A;  . CARDIOVERSION N/A 11/04/2015   Procedure: CARDIOVERSION;  Surgeon: BrLelon PerlaMD;  [facility];  Service: Cardiovascular;  Laterality: N/A;  . CARDIOVERSION N/A 02/25/2016   Procedure: CARDIOVERSION;  Surgeon: KePixie CasinoMD;  [facility];  Service: Cardiovascular;  Laterality: N/A;  . CARDIOVERSION N/A 12/13/2016   Procedure: CARDIOVERSION;  Surgeon: RoFay RecordsMD;  Location: [facility] Service: Cardiovascular;  Laterality: N/A;  . COLONOSCOPY    . EYE SURGERY Bilateral    cataract surgery with lens implant  . INNER EAR SURGERY Left    x 3  . JOINT REPLACEMENT Right   . LAPAROSCOPIC SALPINGO OOPHERECTOMY    . NM MYOVIEW LTD  11/30/2007   normal stress nuclear study/EF-83%  . TONSILLECTOMY    . TOTAL HIP ARTHROPLASTY Right 06/26/2015   Procedure: RIGHT TOTAL HIP ARTHROPLASTY ANTERIOR APPROACH;  Surgeon: NaMarylynn Pearson  Erlinda Hong, MD;  Location: [place];  Service: Orthopedics;  Laterality: Right;  . TRANSTHORACIC ECHOCARDIOGRAM  07/23/2009   mild concentric LVH/mild mitral insufficiency/ moderate tricuspid innsufficiency/mild pulmonary HTN/EF- 55-60%  . VAGINAL DELIVERY      Vitals:   03/10/17 1405  Pulse: (!) 104    Subjective Assessment - 03/10/17 1405    Subjective  Pt reports she was hospitalized for heart issues.  She is please with her progress.  RLE not bothering her like it was.  Still has some tightness across front of Rt hip.     Patient Stated Goals  get rid of the hip and knee pain     Currently in Pain?  Yes    Pain Score  4     Pain Location  Hip    Pain Orientation  Anterior    Pain Descriptors / Indicators  Sore    Aggravating Factors    moving leg a certain way    Pain Relieving Factors  sitting down and resting.          [facility] PT Assessment - 03/10/17 0001      Assessment   Medical Diagnosis  Rt IT band symdrome; Rt psoas tendinitis     Referring Provider  Dr. Frankey Shown     Onset Date/Surgical Date  12/06/16    Hand Dominance  Right    Next MD Visit  03/15/17      Observation/Other Assessments   Focus on Therapeutic Outcomes (FOTO)   53% limited      Strength   Right Hip Flexion  3/5 painful     Right Hip Extension  4-/5    Right Hip ABduction  4+/5      Palpation   Palpation comment  improving tightness Rt psoas and hip flexors; Rt lateral thigh at knee and proximal fibula; posterior hamstrings/proximal calf                   [facility] Adult PT Treatment/Exercise - 03/10/17 0001      Knee/Hip Exercises: Stretches   Passive Hamstring Stretch  Right;2 reps;30 seconds    Hip Flexor Stretch  Right;30 seconds;4 reps leg off edge of table      Knee/Hip Exercises: Standing   Hip Extension  Right;1 set;10 reps;Knee straight UE support    Forward Step Up  Right;1 set;10 reps;Hand Hold: 2;Step Height: 6"    Step Down  Left;1 set;5 reps;Hand Hold: 2;Step Height: 4";Step Height: 6" .  Reciprocal steps with BUE support, slow pace, and VC x 5 reps    Other Standing Knee Exercises  semi-tandem stance x 10 sec without UE support x 2 reps each leg forward.       Knee/Hip Exercises: Supine   Bridges  Both;1 set;15 reps core engaged    Other Supine Knee/Hip Exercises  Rt hip/knee flexion x 10 reps      Knee/Hip Exercises: Sidelying   Hip ABduction  Strengthening;Right;1 set;10 reps    Clams  Rt LE x 10 reps       Moist Heat Therapy   Number Minutes Moist Heat  15 Minutes    Moist Heat Location  Hip      Electrical Stimulation   Electrical Stimulation Location  anterior/lateral Rt hip     Electrical Stimulation Action  IFC    Electrical Stimulation Parameters  to tolerance    Electrical Stimulation Goals   Pain;Tone  PT Long Term Goals - 03/10/17 1544      PT LONG TERM GOAL #1   Title  Improve tightness to palpation through the Rt hip flexors and IT band with patient reporting no to little pain with palpation 03/23/17    Time  6    Period  Weeks    Status  Partially Met      PT LONG TERM GOAL #2   Title  increase Rt hip strength to 4+/4 to 5-/5 allowing patient to get in and out of bed with greater ease and less pain 03/23/17    Time  6    Period  Weeks    Status  Partially Met      PT LONG TERM GOAL #3   Title  decrease pain Rt hip and Rt knee by 50-75% allowing patient to preform functional activities with less pain and greater ease and safety 03/23/17    Time  6    Period  Weeks    Status  Partially Met      PT LONG TERM GOAL #4   Title  Independent in HEP 03/23/17    Time  6    Period  Weeks    Status  On-going      PT LONG TERM GOAL #5   Title  Improve FOTO to </= 49% limtation /16/19    Time  6    Period  Weeks    Status  On-going            Plan - 03/10/17 1545    Clinical Impression Statement  Pt demonstrates continue weakness and pain with Rt hip flexion AROM. Pt tolerated all exercises well, however requires increased time to transition between exercises. HR during treatment was 100-107 bpm.  Pt has partially met her goals and will benefit from continued PT intervention to max functional mobility and safety.     Rehab Potential  Good    PT Frequency  2x / week    PT Duration  6 weeks    PT Treatment/Interventions  Patient/family education;ADLs/Self Care Home Management;Cryotherapy;Electrical Stimulation;Iontophoresis 73m/ml Dexamethasone;Moist Heat;Ultrasound;Dry needling;Manual techniques;Therapeutic activities;Therapeutic exercise    PT Next Visit Plan  Await advisement from MD, will hold therapy until after MD visit.  Pt reports she has a lot of MD visits and may desire to d/c after doctor appt.     Consulted and Agree with Plan of  Care  Patient       Patient will benefit from skilled therapeutic intervention in order to improve the following deficits and impairments:  Postural dysfunction, Improper body mechanics, Increased fascial restricitons, Increased muscle spasms, Decreased strength, Decreased mobility, Decreased range of motion, Decreased activity tolerance, Abnormal gait  Visit Diagnosis: Pain in right hip  Acute pain of right knee  Other symptoms and signs involving the musculoskeletal system     Problem List Patient Active Problem List   Diagnosis Date Noted  . Uncontrolled atrial fibrillation ([place] 02/24/2017  . GERD (gastroesophageal reflux disease) 02/23/2017  . Diabetes (HG[place] 02/23/2017  . Atypical atrial flutter (HLake [place]   . Prolonged Q-T interval on ECG   . Pain in right hip 06/22/2016  . Encounter for monitoring sotalol therapy 05/31/2016  . Chest pain   . CHF exacerbation (HM[place] 02/29/2016  . Acute respiratory failure with hypoxia (HE[place] 02/29/2016  . Persistent atrial fibrillation (HOliv[place]   . Hypertensive heart disease   . Hypercholesterolemia   . Pulmonary hypertension (HBoyle   . Dyspnea on exertion   .  Non-obstructive CAD   . Chest pain, moderate coronary artery risk 07/14/2015  . Pain in the chest   . PAF (paroxysmal atrial fibrillation) (HCC)   . Pulmonary arterial hypertension ([place])   . Chest pain with moderate risk for cardiac etiology 07/08/2015  . Dyspnea 07/08/2015  . Essential hypertension 07/08/2015  . Hyperlipidemia 07/08/2015  . Osteoarthritis of right hip 06/26/2015  . Hip joint replacement status 06/26/2015  . OSA on CPAP 03/27/2015  . Ataxia 03/27/2015  . Falls 03/27/2015  . Insomnia, controlled 03/27/2015  . Insomnia with sleep apnea 03/22/2014  . Adjustment disorder with mixed anxiety and depressed mood 03/22/2014  . Sleep apnea with use of continuous positive airway pressure (CPAP) 10/19/2012  . Multifactorial gait disorder    Kerin Perna,  PTA 03/10/17 3:55 PM  [facility] [place], [name], [address] Phone: [phone]   Fax:  [phone]  Name: Melinda [name] MRN: [id number] Date of Birth: Dec 13, 1931  PHYSICAL THERAPY DISCHARGE SUMMARY  Visits from Start of Care: 6  Current functional level related to goals / functional outcomes: Unknown, pt was pleased with her current progress at last visit and reported that she had a lot of MD appointments coming up and may want to discharge.    Remaining deficits: unknown   Education / Equipment: HEP Plan: Patient agrees to discharge.  Patient goals were partially met. Patient is being discharged due to the patient's request.  ?????    Jeral Pinch, PT 04/20/17 2:39 PM

## 2017-03-14 ENCOUNTER — Telehealth: Payer: Self-pay | Admitting: Cardiovascular Disease

## 2017-03-14 NOTE — Telephone Encounter (Signed)
Routed to pharmacy staff/MD for advice

## 2017-03-14 NOTE — Telephone Encounter (Signed)
Returned call to Jenny Reichmann at [facility] Dr.Jordan's advice given.Message was sent to Dr.Taylor.

## 2017-03-14 NOTE — Telephone Encounter (Signed)
Agreed. No significant interaction noted between amiodarone and desipramine.

## 2017-03-14 NOTE — Telephone Encounter (Signed)
New message   Pharmacist at [facility] states that the medication Burt Knack increased which is the amiodarone (PACERONE) 200 MG tablet will cause increased arithmias when mixed with desipramine (NORPRAMIN) 25 MG tablet Please call

## 2017-03-14 NOTE — Telephone Encounter (Signed)
Amiodarone initiated by Dr. Lovena Le. I don't think this is of major concern but will make him aware.  Peter Martinique MD, [facility]

## 2017-03-15 ENCOUNTER — Ambulatory Visit (INDEPENDENT_AMBULATORY_CARE_PROVIDER_SITE_OTHER): Payer: Medicare HMO | Admitting: Orthopaedic Surgery

## 2017-03-15 DIAGNOSIS — M25551 Pain in right hip: Secondary | ICD-10-CM | POA: Diagnosis not present

## 2017-03-15 DIAGNOSIS — Z96641 Presence of right artificial hip joint: Secondary | ICD-10-CM

## 2017-03-15 NOTE — Progress Notes (Signed)
Office Visit Note   Patient: Melinda [name]           Date of Birth: Jun 27, 1931           MRN: [id number] Visit Date: 03/15/2017              Requested by: Crist Infante, MD [address] [place], [place] [address] PCP: Crist Infante, MD   Assessment & Plan: Visit Diagnoses:  1. Status post right hip replacement   2. Pain in right hip     Plan: Impression is 84 year old female with right hip and groin pain.  I think this is consistent with a hip flexor tendinitis that just has been lingering.  This is mechanical in nature and does not present like this is an infection or issue with the prosthesis.  I would like physical therapy to add iontophoresis or phonophoresis to her physical therapy regimen.  I will see her back in 3 months as scheduled for her 2-year visit.  AP pelvis and lateral hip on return.  Follow-Up Instructions: Return f/u as scheduled in april.   Orders:  No orders of the defined types were placed in this encounter.  No orders of the defined types were placed in this encounter.     Procedures: No procedures performed   Clinical Data: No additional findings.   Subjective: No chief complaint on file.   Patient is following up for her pain.  Overall she is doing much better.  She says Tylenol really helps.  This is not bothersome on a daily basis.  Some days it does not even bother her.  It only affects her when she is climbing up into her truck.  Denies any constitutional symptoms or baseline pain.  Denies any radicular symptoms.    Review of Systems   Objective: Vital Signs: There were no vitals taken for this visit.  Physical Exam  Ortho Exam Right hip exam shows painless rotation of the hip.  Discomfort with resisted straight leg.  There is no swelling or signs of infection. Specialty Comments:  No specialty comments available.  Imaging: No results found.   PMFS History: Patient Active Problem List   Diagnosis Date Noted  .  Uncontrolled atrial fibrillation ([place]) 02/24/2017  . GERD (gastroesophageal reflux disease) 02/23/2017  . Diabetes ([place]) 02/23/2017  . Atypical atrial flutter ([place])   . Prolonged Q-T interval on ECG   . Pain in right hip 06/22/2016  . Encounter for monitoring sotalol therapy 05/31/2016  . Chest pain   . CHF exacerbation ([place]) 02/29/2016  . Acute respiratory failure with hypoxia ([place]) 02/29/2016  . Persistent atrial fibrillation ([place])   . Hypertensive heart disease   . Hypercholesterolemia   . Pulmonary hypertension ([place])   . Dyspnea on exertion   . Non-obstructive CAD   . Chest pain, moderate coronary artery risk 07/14/2015  . Pain in the chest   . PAF (paroxysmal atrial fibrillation) ([facility])   . Pulmonary arterial hypertension ([place])   . Chest pain with moderate risk for cardiac etiology 07/08/2015  . Dyspnea 07/08/2015  . Essential hypertension 07/08/2015  . Hyperlipidemia 07/08/2015  . Osteoarthritis of right hip 06/26/2015  . Hip joint replacement status 06/26/2015  . OSA on CPAP 03/27/2015  . Ataxia 03/27/2015  . Falls 03/27/2015  . Insomnia, controlled 03/27/2015  . Insomnia with sleep apnea 03/22/2014  . Adjustment disorder with mixed anxiety and depressed mood 03/22/2014  . Sleep apnea with use of continuous positive airway pressure (CPAP) 10/19/2012  .  Multifactorial gait disorder    Past Medical History:  Diagnosis Date  . A-fib ([place])   . Acid reflux   . Anxiety   . Arthritis   . Cataract    surgery,bilateral  . Constipation   . Depression   . Diabetes mellitus without complication (HCC)    Pre diabetes  . Dyspnea on exertion    a. 07/2015 Echo: EF 55-60%, no rwma, mild MR, mod dil LA/RA/RV, mod TR, triv PR, PASP 76mmHg.  . Falls   . Family history of adverse reaction to anesthesia    mother always had n/v  . Hx: UTI (urinary tract infection)   . Hypercholesterolemia   . Hypertensive heart disease   . Insomnia   . Multifactorial gait disorder   .  Non-obstructive CAD    a. 07/2015 Cath: LM 30-40d, LAD min irregs, LCX min irregs, RCA 30p, RA 17, RV 33/7, PA 42/19, PCWP 17.  . On home oxygen therapy    "2L w/CPAP at night" (02/24/2017)  . Osteoarthritis    a. 06/2015 s/p R total hip arthroplasty.  [name] PAF (paroxysmal atrial fibrillation) ([place])    a. Dx 07/2015, CHA2DS2VASc = 6-->Eliquis.  . Premature atrial contractions   . Pulmonary hypertension ([place])    a. 07/2015 Echo: PASP 47mmHg.  [name] Pyloric antral stenosis    in childhood,infancy  . Sleep apnea with use of continuous positive airway pressure (CPAP) 10/19/2012   a. compliant w/ CPAP.    Family History  Problem Relation Age of Onset  . Heart attack Father   . Heart failure Mother   . Cancer Mother        colon,kidney  . Diabetes Sister   . Hypertension Sister   . Heart failure Sister     Past Surgical History:  Procedure Laterality Date  . ABDOMINAL HYSTERECTOMY  1970  . ABDOMINAL SURGERY     as a 19 day old baby  . CARDIAC CATHETERIZATION N/A 07/14/2015   Procedure: Right/Left Heart Cath and Coronary Angiography;  Surgeon: Sanda Klein, MD;  Location: [facility];  Service: Cardiovascular;  Laterality: N/A;  . CARDIOVERSION N/A 11/04/2015   Procedure: CARDIOVERSION;  Surgeon: Lelon Perla, MD;  [facility] ENDOSCOPY;  Service: Cardiovascular;  Laterality: N/A;  . CARDIOVERSION N/A 02/25/2016   Procedure: CARDIOVERSION;  Surgeon: Pixie Casino, MD;  Location: [facility] ENDOSCOPY;  Service: Cardiovascular;  Laterality: N/A;  . CARDIOVERSION N/A 12/13/2016   Procedure: CARDIOVERSION;  Surgeon: Fay Records, MD;  Location: [facility];  Service: Cardiovascular;  Laterality: N/A;  . COLONOSCOPY    . EYE SURGERY Bilateral    cataract surgery with lens implant  . INNER EAR SURGERY Left    x 3  . JOINT REPLACEMENT Right   . LAPAROSCOPIC SALPINGO OOPHERECTOMY    . NM MYOVIEW LTD  11/30/2007   normal stress nuclear study/EF-83%  . TONSILLECTOMY    . TOTAL HIP  ARTHROPLASTY Right 06/26/2015   Procedure: RIGHT TOTAL HIP ARTHROPLASTY ANTERIOR APPROACH;  Surgeon: Leandrew Koyanagi, MD;  Location: [facility];  Service: Orthopedics;  Laterality: Right;  . TRANSTHORACIC ECHOCARDIOGRAM  07/23/2009   mild concentric LVH/mild mitral insufficiency/ moderate tricuspid innsufficiency/mild pulmonary HTN/EF- 55-60%  . VAGINAL DELIVERY     Social History   Occupational History  . Occupation: Retired  Tobacco Use  . Smoking status: Never Smoker  . Smokeless tobacco: Never Used  Substance and Sexual Activity  . Alcohol use: No    Alcohol/week: 0.0 oz  Comment: rare  . Drug use: No  . Sexual activity: Not on file

## 2017-03-17 ENCOUNTER — Ambulatory Visit: Payer: Medicare HMO

## 2017-03-21 ENCOUNTER — Ambulatory Visit: Payer: Medicare HMO | Admitting: Internal Medicine

## 2017-03-21 ENCOUNTER — Encounter: Payer: Self-pay | Admitting: Internal Medicine

## 2017-03-21 VITALS — BP 138/86 | HR 101 | Ht 60.0 in | Wt 166.0 lb

## 2017-03-21 DIAGNOSIS — I484 Atypical atrial flutter: Secondary | ICD-10-CM | POA: Diagnosis not present

## 2017-03-21 NOTE — Patient Instructions (Addendum)
Medication Instructions:  Your physician recommends that you continue on your current medications as directed. Please refer to the Current Medication list given to you today.   Labwork: CBC and BMP today  Testing/Procedures:  You are scheduled for a Cardioversion on 03/28/2017 with Dr.Taylor.  Please arrive at the [facility] (Main Entrance A) at [facility]: [address] [place], [place] [address] at 6:30 am.   DIET: Nothing to eat or drink after midnight except a sip of water with medications (see medication instructions below)  Medication Instructions:  Continue your anticoagulant: Eliquis. Be sure to take the morning of your procedure.  You will need to continue your anticoagulant after your procedure until you  are told by Dr Lovena Le that it is safe to stop.   You must have a responsible person to drive you home and stay in the waiting area during your procedure. Failure to do so could result in cancellation.  Bring your insurance cards.  *Special Note: Every effort is made to have your procedure done on time. Occasionally there are emergencies that occur at the hospital that may cause delays. Please be patient if a delay does occur.     Follow-Up: Your physician wants you to follow-up in: 4-6 weeks follow up with Dr Lovena Le.   Any Other Special Instructions Will Be Listed Below (If Applicable).     If you need a refill on your cardiac medications before your next appointment, please call your pharmacy.

## 2017-03-21 NOTE — Progress Notes (Signed)
HPI Melinda [name] returns today for ongoing evaluation and management of atypical atrial flutter with a RVR. She was placed on amiodarone several weeks ago after presenting with rapid atrial flutter and was placed on amiodarone. She has felt better the past couple of days. Her sob is better. No syncope. No edema.  Allergies  Allergen Reactions  . Lasix [Furosemide] Itching  . Phenobarbital Itching  . Amitiza [Lubiprostone] Itching     Current Outpatient Medications  Medication Sig Dispense Refill  . acetaminophen (TYLENOL) 650 MG CR tablet Take 650-1,300 mg by mouth 2 (two) times daily as needed for pain (arthritis pain).     [name] amiodarone (PACERONE) 200 MG tablet Take 1 tablet (200 mg total) by mouth 2 (two) times daily. 180 tablet 3  . apixaban (ELIQUIS) 5 MG TABS tablet Take 1 tablet (5 mg total) by mouth 2 (two) times daily. 60 tablet 0  . atorvastatin (LIPITOR) 40 MG tablet Take 40 mg by mouth daily.     . Calcium Carbonate-Vitamin D3 (CALCIUM 600+D3) 600-400 MG-UNIT TABS Take 1 tablet by mouth daily.    . cholecalciferol (VITAMIN D) 1000 UNITS tablet Take 1,000 Units by mouth daily.     [name] desipramine (NORPRAMIN) 25 MG tablet Take 25 mg by mouth daily.    . famotidine (PEPCID) 20 MG tablet Take 20 mg by mouth daily.    [name] glucosamine-chondroitin 500-400 MG tablet Take 1 tablet by mouth every other day.     [name] KLOR-CON 10 10 MEQ tablet TAKE 2 TABLETS (20 MEQ TOTAL) BY MOUTH DAILY. 60 tablet 6  . linaclotide (LINZESS) 145 MCG CAPS capsule Take 145 mcg by mouth daily before breakfast.     . LORazepam (ATIVAN) 0.5 MG tablet Take 0.5 mg by mouth at bedtime.   5  . metoprolol succinate (TOPROL XL) 25 MG 24 hr tablet Take 1.5 tablets (37.5 mg total) by mouth 2 (two) times daily. 240 tablet 3  . Polyethyl Glycol-Propyl Glycol (SYSTANE) 0.4-0.3 % GEL ophthalmic gel Place 1 application into both eyes at bedtime as needed (dry eyes).    . polyethylene glycol (MIRALAX / GLYCOLAX) packet  Take 17 g by mouth daily. Mix in 8 oz liquid and drink    . PRESCRIPTION MEDICATION Inhale into the lungs at bedtime. Uses CPAP with oxygen    . torsemide (DEMADEX) 10 MG tablet Take 1 tablet (10 mg total) by mouth daily. (Patient taking differently: Take 20 mg by mouth every other day. ) 30 tablet 0  . vitamin B-12 (CYANOCOBALAMIN) 1000 MCG tablet Take 1,000 mcg by mouth daily.     No current facility-administered medications for this visit.      Past Medical History:  Diagnosis Date  . A-fib ([place])   . Acid reflux   . Anxiety   . Arthritis   . Cataract    surgery,bilateral  . Constipation   . Depression   . Diabetes mellitus without complication (HCC)    Pre diabetes  . Dyspnea on exertion    a. 07/2015 Echo: EF 55-60%, no rwma, mild MR, mod dil LA/RA/RV, mod TR, triv PR, PASP 96mmHg.  . Falls   . Family history of adverse reaction to anesthesia    mother always had n/v  . Hx: UTI (urinary tract infection)   . Hypercholesterolemia   . Hypertensive heart disease   . Insomnia   . Multifactorial gait disorder   . Non-obstructive CAD    a. 07/2015 Cath: LM  30-40d, LAD min irregs, LCX min irregs, RCA 30p, RA 17, RV 33/7, PA 42/19, PCWP 17.  . On home oxygen therapy    "2L w/CPAP at night" (02/24/2017)  . Osteoarthritis    a. 06/2015 s/p R total hip arthroplasty.  [name] PAF (paroxysmal atrial fibrillation) ([place])    a. Dx 07/2015, CHA2DS2VASc = 6-->Eliquis.  . Premature atrial contractions   . Pulmonary hypertension ([place])    a. 07/2015 Echo: PASP 50mmHg.  [name] Pyloric antral stenosis    in childhood,infancy  . Sleep apnea with use of continuous positive airway pressure (CPAP) 10/19/2012   a. compliant w/ CPAP.    ROS:   All systems reviewed and negative except as noted in the HPI.   Past Surgical History:  Procedure Laterality Date  . ABDOMINAL HYSTERECTOMY  1970  . ABDOMINAL SURGERY     as a 17 day old baby  . CARDIAC CATHETERIZATION N/A 07/14/2015   Procedure: Right/Left Heart  Cath and Coronary Angiography;  Surgeon: Sanda Klein, MD;  Location: [place];  Service: Cardiovascular;  Laterality: N/A;  . CARDIOVERSION N/A 11/04/2015   Procedure: CARDIOVERSION;  Surgeon: Lelon Perla, MD;  [facility] END[facility]COPY;  Service: Cardiovascular;  Laterality: N/A;  . CARDIOVERSION N/A 02/25/2016   Procedure: CARDIOVERSION;  Surgeon: Pixie Casino, MD;  Location: [facility] ENDOSCOPY;  Service: Cardiovascular;  Laterality: N/A;  . CARDIOVERSION N/A 12/13/2016   Procedure: CARDIOVERSION;  Surgeon: Fay Records, MD;  Location: [facility];  Service: Cardiovascular;  Laterality: N/A;  . COLONOSCOPY    . EYE SURGERY Bilateral    cataract surgery with lens implant  . INNER EAR SURGERY Left    x 3  . JOINT REPLACEMENT Right   . LAPAROSCOPIC SALPINGO OOPHERECTOMY    . NM MYOVIEW LTD  11/30/2007   normal stress nuclear study/EF-83%  . TONSILLECTOMY    . TOTAL HIP ARTHROPLASTY Right 06/26/2015   Procedure: RIGHT TOTAL HIP ARTHROPLASTY ANTERIOR APPROACH;  Surgeon: Leandrew Koyanagi, MD;  Location: [facility];  Service: Orthopedics;  Laterality: Right;  . TRANSTHORACIC ECHOCARDIOGRAM  07/23/2009   mild concentric LVH/mild mitral insufficiency/ moderate tricuspid innsufficiency/mild pulmonary HTN/EF- 55-60%  . VAGINAL DELIVERY       Family History  Problem Relation Age of Onset  . Heart attack Father   . Heart failure Mother   . Cancer Mother        colon,kidney  . Diabetes Sister   . Hypertension Sister   . Heart failure Sister      Social History   Socioeconomic History  . Marital status: Widowed    Spouse name: Not on file  . Number of children: 1  . Years of education: 69  . Highest education level: Not on file  Social Needs  . Financial resource strain: Not on file  . Food insecurity - worry: Not on file  . Food insecurity - inability: Not on file  . Transportation needs - medical: Not on file  . Transportation needs - non-medical: Not on file  Occupational  History  . Occupation: Retired  Tobacco Use  . Smoking status: Never Smoker  . Smokeless tobacco: Never Used  Substance and Sexual Activity  . Alcohol use: No    Alcohol/week: 0.0 oz    Comment: rare  . Drug use: No  . Sexual activity: Not on file  Other Topics Concern  . Not on file  Social History Narrative   Patient lives alone.    Patient is  widowed.    Patient retired.    Patient some college.    Patient has one child.    Caffeine 1-2 cups daily avg.     BP 138/86   Pulse (!) 101   Ht 5' (1.524 m)   Wt 166 lb (75.3 kg)   BMI 32.42 kg/m   Physical Exam:  Well appearing elderly woman, NAD HEENT: Unremarkable Neck:  6 cm JVD, no thyromegally Lymphatics:  No adenopathy Back:  No CVA tenderness Lungs:  Clear HEART:  Regular rate rhythm, no murmurs, no rubs, no clicks Abd:  soft, positive bowel sounds, no organomegally, no rebound, no guarding Ext:  2 plus pulses, no edema, no cyanosis, no clubbing Skin:  No rashes no nodules Neuro:  CN II through XII intact, motor grossly intact  EKG - atrial flutter with 2:1 AV conduction.  DEVICE  Normal device function.  See PaceArt for details.   Assess/Plan: 1. Atypical atrial flutter - I have discussed proceeding with DCCV with the patient and she wishes to proceed. This will be scheduled in the coming weeks. 2. Chronic diastolic heart failure - her symptoms are class 2 and she is well compensated. Will follow. 3. Atrial fib - she has not had any on amio 4. HTN - her blood pressure is elevated a bit. Hopefully her meds can be uptitrated after return to [facility].  Mikle Bosworth.D.

## 2017-03-22 ENCOUNTER — Inpatient Hospital Stay (HOSPITAL_COMMUNITY)
Admission: EM | Admit: 2017-03-22 | Discharge: 2017-03-24 | DRG: 308 | Disposition: A | Discharge status: Home / Self Care | Payer: Medicare HMO | Attending: Cardiology | Admitting: Cardiology

## 2017-03-22 ENCOUNTER — Ambulatory Visit: Payer: Medicare HMO

## 2017-03-22 ENCOUNTER — Encounter (HOSPITAL_COMMUNITY): Payer: Self-pay | Admitting: Emergency Medicine

## 2017-03-22 ENCOUNTER — Emergency Department (HOSPITAL_COMMUNITY): Payer: Medicare HMO

## 2017-03-22 DIAGNOSIS — Z7901 Long term (current) use of anticoagulants: Secondary | ICD-10-CM

## 2017-03-22 DIAGNOSIS — E119 Type 2 diabetes mellitus without complications: Secondary | ICD-10-CM

## 2017-03-22 DIAGNOSIS — Z9981 Dependence on supplemental oxygen: Secondary | ICD-10-CM | POA: Diagnosis not present

## 2017-03-22 DIAGNOSIS — Z9989 Dependence on other enabling machines and devices: Secondary | ICD-10-CM

## 2017-03-22 DIAGNOSIS — Z96641 Presence of right artificial hip joint: Secondary | ICD-10-CM | POA: Diagnosis present

## 2017-03-22 DIAGNOSIS — I10 Essential (primary) hypertension: Secondary | ICD-10-CM | POA: Diagnosis present

## 2017-03-22 DIAGNOSIS — R579 Shock, unspecified: Secondary | ICD-10-CM | POA: Diagnosis present

## 2017-03-22 DIAGNOSIS — R079 Chest pain, unspecified: Secondary | ICD-10-CM | POA: Diagnosis present

## 2017-03-22 DIAGNOSIS — I481 Persistent atrial fibrillation: Secondary | ICD-10-CM | POA: Diagnosis not present

## 2017-03-22 DIAGNOSIS — F329 Major depressive disorder, single episode, unspecified: Secondary | ICD-10-CM | POA: Diagnosis present

## 2017-03-22 DIAGNOSIS — I484 Atypical atrial flutter: Secondary | ICD-10-CM | POA: Diagnosis not present

## 2017-03-22 DIAGNOSIS — N183 Chronic kidney disease, stage 3 (moderate): Secondary | ICD-10-CM | POA: Diagnosis not present

## 2017-03-22 DIAGNOSIS — I4892 Unspecified atrial flutter: Secondary | ICD-10-CM | POA: Diagnosis not present

## 2017-03-22 DIAGNOSIS — R001 Bradycardia, unspecified: Secondary | ICD-10-CM | POA: Diagnosis not present

## 2017-03-22 DIAGNOSIS — E785 Hyperlipidemia, unspecified: Secondary | ICD-10-CM | POA: Diagnosis not present

## 2017-03-22 DIAGNOSIS — I130 Hypertensive heart and chronic kidney disease with heart failure and stage 1 through stage 4 chronic kidney disease, or unspecified chronic kidney disease: Secondary | ICD-10-CM | POA: Diagnosis present

## 2017-03-22 DIAGNOSIS — Z9181 History of falling: Secondary | ICD-10-CM

## 2017-03-22 DIAGNOSIS — K219 Gastro-esophageal reflux disease without esophagitis: Secondary | ICD-10-CM | POA: Diagnosis present

## 2017-03-22 DIAGNOSIS — R578 Other shock: Secondary | ICD-10-CM | POA: Diagnosis not present

## 2017-03-22 DIAGNOSIS — E1122 Type 2 diabetes mellitus with diabetic chronic kidney disease: Secondary | ICD-10-CM | POA: Diagnosis not present

## 2017-03-22 DIAGNOSIS — Z6833 Body mass index (BMI) 33.0-33.9, adult: Secondary | ICD-10-CM

## 2017-03-22 DIAGNOSIS — I2510 Atherosclerotic heart disease of native coronary artery without angina pectoris: Secondary | ICD-10-CM | POA: Diagnosis present

## 2017-03-22 DIAGNOSIS — F05 Delirium due to known physiological condition: Secondary | ICD-10-CM | POA: Diagnosis not present

## 2017-03-22 DIAGNOSIS — I5032 Chronic diastolic (congestive) heart failure: Secondary | ICD-10-CM | POA: Diagnosis present

## 2017-03-22 DIAGNOSIS — I480 Paroxysmal atrial fibrillation: Secondary | ICD-10-CM | POA: Diagnosis not present

## 2017-03-22 DIAGNOSIS — K5900 Constipation, unspecified: Secondary | ICD-10-CM | POA: Diagnosis present

## 2017-03-22 DIAGNOSIS — G4733 Obstructive sleep apnea (adult) (pediatric): Secondary | ICD-10-CM

## 2017-03-22 DIAGNOSIS — R072 Precordial pain: Secondary | ICD-10-CM

## 2017-03-22 DIAGNOSIS — E7800 Pure hypercholesterolemia, unspecified: Secondary | ICD-10-CM | POA: Diagnosis present

## 2017-03-22 DIAGNOSIS — F419 Anxiety disorder, unspecified: Secondary | ICD-10-CM | POA: Diagnosis present

## 2017-03-22 DIAGNOSIS — R270 Ataxia, unspecified: Secondary | ICD-10-CM | POA: Diagnosis present

## 2017-03-22 DIAGNOSIS — I2721 Secondary pulmonary arterial hypertension: Secondary | ICD-10-CM | POA: Diagnosis present

## 2017-03-22 DIAGNOSIS — Z833 Family history of diabetes mellitus: Secondary | ICD-10-CM

## 2017-03-22 DIAGNOSIS — R2689 Other abnormalities of gait and mobility: Secondary | ICD-10-CM | POA: Diagnosis present

## 2017-03-22 DIAGNOSIS — R69 Illness, unspecified: Secondary | ICD-10-CM | POA: Diagnosis not present

## 2017-03-22 DIAGNOSIS — Z8249 Family history of ischemic heart disease and other diseases of the circulatory system: Secondary | ICD-10-CM

## 2017-03-22 DIAGNOSIS — Z888 Allergy status to other drugs, medicaments and biological substances status: Secondary | ICD-10-CM

## 2017-03-22 LAB — CBC WITH DIFFERENTIAL/PLATELET
BASOS ABS: 0 10*3/uL (ref 0.0–0.2)
BASOS: 1 %
EOS (ABSOLUTE): 0.1 10*3/uL (ref 0.0–0.4)
Eos: 3 %
HEMOGLOBIN: 13.1 g/dL (ref 11.1–15.9)
Hematocrit: 38.4 % (ref 34.0–46.6)
Immature Grans (Abs): 0 10*3/uL (ref 0.0–0.1)
Immature Granulocytes: 1 %
LYMPHS ABS: 0.8 10*3/uL (ref 0.7–3.1)
LYMPHS: 19 %
MCH: 31.6 pg (ref 26.6–33.0)
MCHC: 34.1 g/dL (ref 31.5–35.7)
MCV: 93 fL (ref 79–97)
Monocytes Absolute: 0.5 10*3/uL (ref 0.1–0.9)
Monocytes: 12 %
Neutrophils Absolute: 2.7 10*3/uL (ref 1.4–7.0)
Neutrophils: 64 %
PLATELETS: 219 10*3/uL (ref 150–379)
RBC: 4.14 x10E6/uL (ref 3.77–5.28)
RDW: 14.5 % (ref 12.3–15.4)
WBC: 4.2 10*3/uL (ref 3.4–10.8)

## 2017-03-22 LAB — BASIC METABOLIC PANEL
Anion gap: 10 (ref 5–15)
BUN / CREAT RATIO: 21 (ref 12–28)
BUN: 22 mg/dL (ref 8–27)
BUN: 20 mg/dL (ref 6–20)
CHLORIDE: 98 mmol/L — AB (ref 101–111)
CO2: 30 mmol/L — ABNORMAL HIGH (ref 20–29)
CO2: 30 mmol/L (ref 22–32)
CREATININE: 1.06 mg/dL — AB (ref 0.57–1.00)
Calcium: 9.4 mg/dL (ref 8.7–10.3)
Calcium: 9.2 mg/dL (ref 8.9–10.3)
Chloride: 99 mmol/L (ref 96–106)
Creatinine, Ser: 1.15 mg/dL — ABNORMAL HIGH (ref 0.44–1.00)
GFR calc Af Amer: 50 mL/min — ABNORMAL LOW (ref >60)
GFR calc non Af Amer: 49 mL/min/{1.73_m2} — ABNORMAL LOW (ref >59)
GFR calc non Af Amer: 43 mL/min — ABNORMAL LOW (ref >60)
GFR, EST AFRICAN AMERICAN: 56 mL/min/{1.73_m2} — AB (ref >59)
Glucose, Bld: 121 mg/dL — ABNORMAL HIGH (ref 65–99)
Glucose: 112 mg/dL — ABNORMAL HIGH (ref 65–99)
POTASSIUM: 3.3 mmol/L — AB (ref 3.5–5.1)
Potassium: 3.9 mmol/L (ref 3.5–5.2)
SODIUM: 144 mmol/L (ref 134–144)
SODIUM: 138 mmol/L (ref 135–145)

## 2017-03-22 LAB — I-STAT TROPONIN, ED
TROPONIN I, POC: 0.01 ng/mL (ref 0.00–0.08)
Troponin i, poc: 0.01 ng/mL (ref 0.00–0.08)

## 2017-03-22 LAB — CBC
HEMATOCRIT: 40.6 % (ref 36.0–46.0)
Hemoglobin: 13.4 g/dL (ref 12.0–15.0)
MCH: 32.2 pg (ref 26.0–34.0)
MCHC: 33 g/dL (ref 30.0–36.0)
MCV: 97.6 fL (ref 78.0–100.0)
Platelets: 195 10*3/uL (ref 150–400)
RBC: 4.16 MIL/uL (ref 3.87–5.11)
RDW: 13.8 % (ref 11.5–15.5)
WBC: 4.4 10*3/uL (ref 4.0–10.5)

## 2017-03-22 MED ORDER — ONDANSETRON HCL 4 MG/2ML IJ SOLN: 4.0000 mg | Freq: Four times a day (QID) | INTRAMUSCULAR | Status: DC | PRN

## 2017-03-22 MED ORDER — POLYETHYLENE GLYCOL 3350 17 G PO PACK
17.0000 g | PACK | Freq: Every day | ORAL | Status: DC
  Filled 2017-03-22: qty 1

## 2017-03-22 MED ORDER — VITAMIN D 1000 UNITS PO TABS
1000.0000 [IU] | ORAL_TABLET | Freq: Every day | ORAL | Status: DC
  Administered 2017-03-23 – 2017-03-24 (×2): 1000 [IU] via ORAL
  Filled 2017-03-22 (×2): qty 1

## 2017-03-22 MED ORDER — LORAZEPAM 0.5 MG PO TABS
0.5000 mg | ORAL_TABLET | Freq: Every day | ORAL | Status: DC
  Administered 2017-03-22 – 2017-03-23 (×2): 0.5 mg via ORAL
  Filled 2017-03-22 (×2): qty 1

## 2017-03-22 MED ORDER — CALCIUM CARBONATE-VITAMIN D3 600-400 MG-UNIT PO TABS: 1.0000 | ORAL_TABLET | Freq: Every day | ORAL | Status: DC

## 2017-03-22 MED ORDER — DESIPRAMINE HCL 25 MG PO TABS
25.0000 mg | ORAL_TABLET | Freq: Every day | ORAL | Status: DC
  Administered 2017-03-23 – 2017-03-24 (×2): 25 mg via ORAL
  Filled 2017-03-22 (×3): qty 1

## 2017-03-22 MED ORDER — AMIODARONE HCL 200 MG PO TABS
200.0000 mg | ORAL_TABLET | Freq: Two times a day (BID) | ORAL | Status: DC
  Administered 2017-03-22 – 2017-03-24 (×4): 200 mg via ORAL
  Filled 2017-03-22 (×4): qty 1

## 2017-03-22 MED ORDER — POTASSIUM CHLORIDE CRYS ER 10 MEQ PO TBCR
20.0000 meq | EXTENDED_RELEASE_TABLET | Freq: Every day | ORAL | Status: DC
  Administered 2017-03-23 – 2017-03-24 (×2): 20 meq via ORAL
  Filled 2017-03-22 (×3): qty 2

## 2017-03-22 MED ORDER — APIXABAN 5 MG PO TABS
5.0000 mg | ORAL_TABLET | Freq: Two times a day (BID) | ORAL | Status: DC
  Administered 2017-03-22 – 2017-03-24 (×4): 5 mg via ORAL
  Filled 2017-03-22 (×4): qty 1

## 2017-03-22 MED ORDER — FAMOTIDINE 20 MG PO TABS
20.0000 mg | ORAL_TABLET | Freq: Every day | ORAL | Status: DC
  Administered 2017-03-23 – 2017-03-24 (×2): 20 mg via ORAL
  Filled 2017-03-22 (×2): qty 1

## 2017-03-22 MED ORDER — POTASSIUM CHLORIDE CRYS ER 20 MEQ PO TBCR
40.0000 meq | EXTENDED_RELEASE_TABLET | Freq: Once | ORAL | Status: AC
  Administered 2017-03-22: 40 meq via ORAL
  Filled 2017-03-22: qty 2

## 2017-03-22 MED ORDER — METOPROLOL SUCCINATE ER 25 MG PO TB24
37.5000 mg | ORAL_TABLET | Freq: Two times a day (BID) | ORAL | Status: DC
  Administered 2017-03-22 – 2017-03-23 (×2): 37.5 mg via ORAL
  Filled 2017-03-22: qty 2
  Filled 2017-03-22: qty 1

## 2017-03-22 MED ORDER — TORSEMIDE 20 MG PO TABS
10.0000 mg | ORAL_TABLET | Freq: Every day | ORAL | Status: DC
  Administered 2017-03-23: 10 mg via ORAL
  Filled 2017-03-22: qty 1

## 2017-03-22 MED ORDER — LINACLOTIDE 145 MCG PO CAPS
145.0000 ug | ORAL_CAPSULE | Freq: Every day | ORAL | Status: DC
  Administered 2017-03-24: 145 ug via ORAL
  Filled 2017-03-22 (×2): qty 1

## 2017-03-22 MED ORDER — VITAMIN B-12 1000 MCG PO TABS
1000.0000 ug | ORAL_TABLET | Freq: Every day | ORAL | Status: DC
  Administered 2017-03-23 – 2017-03-24 (×2): 1000 ug via ORAL
  Filled 2017-03-22 (×2): qty 1

## 2017-03-22 MED ORDER — ATORVASTATIN CALCIUM 40 MG PO TABS
40.0000 mg | ORAL_TABLET | Freq: Every day | ORAL | Status: DC
  Administered 2017-03-23 – 2017-03-24 (×2): 40 mg via ORAL
  Filled 2017-03-22 (×2): qty 1

## 2017-03-22 MED ORDER — CALCIUM CARBONATE-VITAMIN D 500-200 MG-UNIT PO TABS
1.0000 | ORAL_TABLET | Freq: Every day | ORAL | Status: DC
Start: 2017-03-23 — End: 2017-03-24
  Administered 2017-03-23 – 2017-03-24 (×2): 1 via ORAL
  Filled 2017-03-22 (×3): qty 1

## 2017-03-22 MED ORDER — POLYVINYL ALCOHOL 1.4 % OP SOLN
1.0000 [drp] | Freq: Every day | OPHTHALMIC | Status: DC
  Administered 2017-03-23: 1 [drp] via OPHTHALMIC
  Filled 2017-03-22: qty 15

## 2017-03-22 MED ORDER — GLUCOSAMINE-CHONDROITIN 500-400 MG PO TABS: 1.0000 | ORAL_TABLET | ORAL | Status: DC

## 2017-03-22 NOTE — ED Triage Notes (Signed)
Pt was at radiology receiving mammogram when she started experiencing central chest pain that was radiating into her jaw line that lasted approximately 1 hour and has resolved on arrival to ED. EKG done on arrival and given to MD yelverton.

## 2017-03-22 NOTE — Consult Note (Signed)
Cardiology Consultation:   Patient ID: Melinda [name]; [id number]; 1931-05-24   Admit date: 03/22/2017 Date of Consult: 03/22/2017  Primary Care Provider: Crist Infante, MD Primary Cardiologist: Dr Martinique Primary Electrophysiologist:  Dr Lovena Le   Patient Profile:   Melinda [name] is a 84 y.o. female with a hx of PAF on Eliquis, CP w/ minor CAD at cath 07/2015, DM, HTN, HLD, OSA on CPAP, who is being seen today for the evaluation of chest pain at the request of Dr Lita Mains.  She saw Dr Lovena Le yesterday and was in atrial flutter w/ 2:1 conduction. DCCV planned for 03/28/2017. No med changes were made.   History of Present Illness:   Melinda [name] was on her way to have a mammogram this pm and had onset of SSCP, pressure, at about 12:15, while driving. It was 4/10 and radiated up to her L ear and jaw. She had SOB w/ this and a HA. When she got to radiology, EMS was called. She got ASA but no other meds. She was transported to the [facility] ER, pain resolved by arrival. She SOB and headache have also resolved.  The CP was not changed by position change or deep inspiration.   Last pm, she had headache and nausea. Sx resolved by this am. She did not have N&V or diaphoresis with the chest pain.   The pain is similar to previous CP episodes. She gets these about once a month. They generally occur at bedtime.   She was admitted 12/19-12/21/2018 for chest pain. Those sx improved w/ ASA 81 mg. She was in atrial flutter during that admission. Her sotalol was stopped and metoprolol was increased. She was started on amiodarone.   No palpitations, dizzyness or presyncope. She is not aware of the atrial flutter in general, but can occasionally feel her heart beat.  No LE edema, no orthopnea or PND. She has chronic DOE, no recent change.   Her activity is mainly limited by MS issues. She uses a walker in the morning, does not generally use it in the afternoon. She falls 1-2 x month, the last  time was during the big snow recently. Has not gotten any significant injuries, if she hits her head, she gets evaluated. She feels her balance is bad, she states she is unsteady when she walks.  [name] can climb a flight of stairs without stopping, but uses both feet on each one.   No bleeding issues.    Past Medical History:  Diagnosis Date  . A-fib ([place])   . Acid reflux   . Anxiety   . Arthritis   . Cataract    surgery,bilateral  . Constipation   . Depression   . Diabetes mellitus without complication (HCC)    Pre diabetes  . Dyspnea on exertion    a. 07/2015 Echo: EF 55-60%, no rwma, mild MR, mod dil LA/RA/RV, mod TR, triv PR, PASP 51mmHg.  . Falls   . Family history of adverse reaction to anesthesia    mother always had n/v  . Hx: UTI (urinary tract infection)   . Hypercholesterolemia   . Hypertensive heart disease   . Insomnia   . Multifactorial gait disorder   . Non-obstructive CAD    a. 07/2015 Cath: LM 30-40d, LAD min irregs, LCX min irregs, RCA 30p, RA 17, RV 33/7, PA 42/19, PCWP 17.  . On home oxygen therapy    "2L w/CPAP at night" (02/24/2017)  . Osteoarthritis    a. 06/2015 s/p R total  hip arthroplasty.  [name] PAF (paroxysmal atrial fibrillation) ([place])    a. Dx 07/2015, CHA2DS2VASc = 6-->Eliquis.  . Premature atrial contractions   . Pulmonary hypertension ([place])    a. 07/2015 Echo: PASP 71mmHg.  [name] Pyloric antral stenosis    in childhood,infancy  . Sleep apnea with use of continuous positive airway pressure (CPAP) 10/19/2012   a. compliant w/ CPAP.    Past Surgical History:  Procedure Laterality Date  . ABDOMINAL HYSTERECTOMY  1970  . ABDOMINAL SURGERY     as a 49 day old baby  . CARDIAC CATHETERIZATION N/A 07/14/2015   Procedure: Right/Left Heart Cath and Coronary Angiography;  Surgeon: Sanda Klein, MD;  Location: [place] [facility];  Service: Cardiovascular;  Laterality: N/A;  . CARDIOVERSION N/A 11/04/2015   Procedure: CARDIOVERSION;  Surgeon: Lelon Perla,  MD;  Location: [facility] ENDOSCOPY;  Service: Cardiovascular;  Laterality: N/A;  . CARDIOVERSION N/A 02/25/2016   Procedure: CARDIOVERSION;  Surgeon: Pixie Casino, MD;  Location: [facility] ENDOSCOPY;  Service: Cardiovascular;  Laterality: N/A;  . CARDIOVERSION N/A 12/13/2016   Procedure: CARDIOVERSION;  Surgeon: Fay Records, MD;  Location: [place];  Service: Cardiovascular;  Laterality: N/A;  . COLONOSCOPY    . EYE SURGERY Bilateral    cataract surgery with lens implant  . INNER EAR SURGERY Left    x 3  . JOINT REPLACEMENT Right   . LAPAROSCOPIC SALPINGO OOPHERECTOMY    . NM MYOVIEW LTD  11/30/2007   normal stress nuclear study/EF-83%  . TONSILLECTOMY    . TOTAL HIP ARTHROPLASTY Right 06/26/2015   Procedure: RIGHT TOTAL HIP ARTHROPLASTY ANTERIOR APPROACH;  Surgeon: Leandrew Koyanagi, MD;  Location: [place];  Service: Orthopedics;  Laterality: Right;  . TRANSTHORACIC ECHOCARDIOGRAM  07/23/2009   mild concentric LVH/mild mitral insufficiency/ moderate tricuspid innsufficiency/mild pulmonary HTN/EF- 55-60%  . VAGINAL DELIVERY       Prior to Admission medications   Medication Sig Start Date End Date Taking? Authorizing Provider  acetaminophen (TYLENOL) 650 MG CR tablet Take 650-1,300 mg by mouth 2 (two) times daily as needed for pain (arthritis pain).     [provider]  amiodarone (PACERONE) 200 MG tablet Take 1 tablet (200 mg total) by mouth 2 (two) times daily. 03/04/17 03/04/18  Sherren Mocha, MD  apixaban (ELIQUIS) 5 MG TABS tablet Take 1 tablet (5 mg total) by mouth 2 (two) times daily. 07/15/15   Robbie Lis, MD  atorvastatin (LIPITOR) 40 MG tablet Take 40 mg by mouth daily.     [provider]  Calcium Carbonate-Vitamin D3 (CALCIUM 600+D3) 600-400 MG-UNIT TABS Take 1 tablet by mouth daily.    [provider]  cholecalciferol (VITAMIN D) 1000 UNITS tablet Take 1,000 Units by mouth daily.     [provider]  desipramine (NORPRAMIN) 25 MG tablet Take 25 mg by  mouth daily.    [provider]  famotidine (PEPCID) 20 MG tablet Take 20 mg by mouth daily.    [provider]  glucosamine-chondroitin 500-400 MG tablet Take 1 tablet by mouth every other day.     [provider]  KLOR-CON 10 10 MEQ tablet TAKE 2 TABLETS (20 MEQ TOTAL) BY MOUTH DAILY. 11/25/16 03/21/17  Sherran Needs, NP  linaclotide Rolan Lipa) 145 MCG CAPS capsule Take 145 mcg by mouth daily before breakfast.     [provider]  LORazepam (ATIVAN) 0.5 MG tablet Take 0.5 mg by mouth at bedtime.  12/26/15   [provider]  metoprolol succinate (TOPROL XL) 25 MG 24 hr tablet Take 1.5 tablets (37.5 mg total) by mouth 2 (two) times daily. 03/04/17   Sherren Mocha, MD  Polyethyl Glycol-Propyl Glycol (SYSTANE) 0.4-0.3 % GEL ophthalmic gel Place 1 application into both eyes at bedtime as needed (dry eyes).    [provider]  polyethylene glycol (MIRALAX / GLYCOLAX) packet Take 17 g by mouth daily. Mix in 8 oz liquid and drink    [provider]  PRESCRIPTION MEDICATION Inhale into the lungs at bedtime. Uses CPAP with oxygen    [provider]  torsemide (DEMADEX) 10 MG tablet Take 1 tablet (10 mg total) by mouth daily. Patient taking differently: Take 20 mg by mouth every other day.  03/03/16   Lavina Hamman, MD  vitamin B-12 (CYANOCOBALAMIN) 1000 MCG tablet Take 1,000 mcg by mouth daily.    [provider]    Inpatient Medications: Scheduled Meds:  Continuous Infusions:  PRN Meds:   Allergies:    Allergies  Allergen Reactions  . Lasix [Furosemide] Itching  . Phenobarbital Itching  . Amitiza [Lubiprostone] Itching    Social History:   Social History   Socioeconomic History  . Marital status: Widowed    Spouse name: Not on file  . Number of children: 1  . Years of education: 28  . Highest education level: Not on file  Social Needs  . Financial resource strain: Not on file  . Food insecurity -  worry: Not on file  . Food insecurity - inability: Not on file  . Transportation needs - medical: Not on file  . Transportation needs - non-medical: Not on file  Occupational History  . Occupation: Retired  Tobacco Use  . Smoking status: Never Smoker  . Smokeless tobacco: Never Used  Substance and Sexual Activity  . Alcohol use: No    Alcohol/week: 0.0 oz    Comment: rare  . Drug use: No  . Sexual activity: Not on file  Other Topics Concern  . Not on file  Social History Narrative   Patient lives alone.    Patient is widowed.    Patient retired.    Patient some college.    Patient has one child.    Caffeine 1-2 cups daily avg.    Family History:   Family History  Problem Relation Age of Onset  . Heart attack Father   . Heart failure Mother   . Cancer Mother        colon,kidney  . Diabetes Sister   . Hypertension Sister   . Heart failure Sister    Family Status:  Family Status  Relation Name Status  . Father  Deceased at age 22       MI  . Mother  Deceased       CHF  . Sister  Alive       Palpitations  . Sister  Alive    ROS:  Please see the history of present illness.  All other ROS reviewed and negative.     Physical Exam/Data:   Vitals:   03/22/17 1331 03/22/17 1428 03/22/17 1433 03/22/17 1500  BP:  (!) 138/103  (!) 122/91  Pulse:      Resp:   17 (!) 21  Temp:      TempSrc:      SpO2:      Weight: 160 lb 3.2 oz (72.7 kg)     Height: 5' (1.524 m)      No intake or  output data in the 24 hours ending 03/22/17 1618 Filed Weights   03/22/17 1331  Weight: 160 lb 3.2 oz (72.7 kg)   Body mass index is 31.29 kg/m.  General:  Well nourished, well developed, in no acute distress HEENT: normal Lymph: no adenopathy Neck: no JVD Endocrine:  No thryomegaly Vascular: No carotid bruits; 4/4 extremity pulses 2+, without bruits  Cardiac:  normal S1, S2; slightly irregular R&R; no murmur  Lungs:  clear to auscultation bilaterally, no wheezing, rhonchi or  rales  Abd: soft, nontender, no hepatomegaly  Ext: no edema Musculoskeletal:  No deformities, BUE and BLE strength normal and equal Skin: warm and dry  Neuro:  CNs 2-12 intact, no focal abnormalities noted Psych:  Normal affect   EKG:  The EKG was personally reviewed and demonstrates:  Atrial flutter (relatively slow) with 2:1 conduction, Ventricular rate 102 Telemetry:  Telemetry was personally reviewed and demonstrates:  Atrial flutter, RVR at times  Relevant CV Studies:  ECHO: 02/24/2017 - Left ventricle: The cavity size was normal. Systolic function was   mildly reduced. The estimated ejection fraction was in the range   of 45% to 50%. Diffuse hypokinesis worse in the septal   myocardium. - Aortic valve: There was no regurgitation. - Mitral valve: Mildly calcified annulus. There was mild   regurgitation. - Left atrium: The atrium was moderately dilated. - Right ventricle: The cavity size was normal. Wall thickness was   normal. Systolic function was normal. - Right atrium: The atrium was severely dilated. - Atrial septum: No defect or patent foramen ovale was identified. - Tricuspid valve: There was mild regurgitation. - Pulmonary arteries: Systolic pressure was within the normal   range. PA peak pressure: 32 mm Hg (S). - Pericardium, extracardiac: A small pericardial effusion was   identified. There was evidence for increased RV-LV interaction   demonstrated by respirophasic changes in transmitral velocities   and tricuspid velocities. Impressions: - Variation was noted in the mitral and tricuspid valve inflow.   This is likely due to atrial fibrillation rather than tamponade.   IVC collapses normally.  CATH:  07/14/2015  LM lesion, 30% stenosed.  Prox RCA lesion, 30% stenosed.  Mid LAD to Dist LAD lesion, 10% stenosed. IMPRESSIONS:  Mild coronary atherosclerosis, not hemodynamically significant. Minimally elevated LVEDP at rest, consistent with mild diastolic  dysfunction. Mild pulmonary artery hypertension. Transpulmonary gradient >12 mm Hg suggests intrinsic pulmonary arteriolar hypertension, consistent with a pulmonary cause for PAH and dyspnea.  RECOMMENDATION:  Check pulmonary function tests and reevaluate CPAP treatment settings for OSA. Symptoms appear to be non-cardiac in etiology. Statin therapy is probably appropriate. Echocardiogram should be performed, but would be more informative when she is in NSR. Start direct oral anticoagulant 4 hours after cath if no radial site bleeding.  Laboratory Data:  Chemistry Recent Labs  Lab 03/21/17 1504 03/22/17 1355  NA 144 138  K 3.9 3.3*  CL 99 98*  CO2 30* 30  GLUCOSE 112* 121*  BUN 22 20  CREATININE 1.06* 1.15*  CALCIUM 9.4 9.2  GFRNONAA 49* 43*  GFRAA 56* 50*  ANIONGAP  --  10    Lab Results  Component Value Date   ALT 17 11/30/2016   AST 21 11/30/2016   ALKPHOS 61 11/30/2016   BILITOT 1.0 11/30/2016   Hematology Recent Labs  Lab 03/21/17 1504 03/22/17 1355  WBC 4.2 4.4  RBC 4.14 4.16  HGB 13.1 13.4  HCT 38.4 40.6  MCV 93 97.6  MCH 31.6  32.2  MCHC 34.1 33.0  RDW 14.5 13.8  PLT 219 195   Cardiac EnzymesNo results for input(s): TROPONINI in the last 168 hours.  Recent Labs  Lab 03/22/17 1408  TROPIPOC 0.01    TSH:  Lab Results  Component Value Date   TSH 2.374 11/30/2016   Lipids: Lab Results  Component Value Date   CHOL 153 02/23/2017   HDL 62 02/23/2017   LDLCALC 64 02/23/2017   TRIG 137 02/23/2017   CHOLHDL 2.5 02/23/2017   HgbA1c: Lab Results  Component Value Date   HGBA1C 7.0 (H) 02/23/2017   Magnesium:  Magnesium  Date Value Ref Range Status  11/30/2016 2.1 1.7 - 2.4 mg/dL Final     Radiology/Studies:  Dg Chest 2 View  Result Date: 03/22/2017 CLINICAL DATA:  Chest pain EXAM: CHEST  2 VIEW COMPARISON:  02/23/2017.  03/11/2016. FINDINGS: Stable asymmetric elevation right hemidiaphragm. The lungs are clear without focal pneumonia,  edema, pneumothorax or pleural effusion. Cardiopericardial silhouette is at upper limits of normal for size. Right hilar fullness similar to prior studies compatible with vascular anatomy. The visualized bony structures of the thorax are intact. IMPRESSION: Stable.  No acute findings. Electronically Signed   By: Misty Stanley M.D.   On: 03/22/2017 14:40    Assessment and Plan:   Principal Problem: 1.  Chest pain with moderate risk for cardiac etiology - although pt has multiple CRFs, she has been evaluated multiple times with no cardiac cause found for her pain.  - However, her EF was lower in the 02/24/2017 echo than previous, ? Significant WMA - no ischemic eval since that echo - It is also possible that her atrial flutter rate is elevated at times and that is the reason for the chest pain.  - her ECG has not indicated spasm and she is at low risk for PE.   - will review data w/ MD and decide on eval.    Active Problems: 2.  Atypical atrial flutter ([place]) - pt has DCCV scheduled for 01/21 - MD advise if we should schedule for tomorrow. - no TEE needed. - if d/c w/ f/u 01/21 as arranged is best plan, increase Toprol XL from 37.5>>50 mg bid  3. Chronic anticoagulation - No doses missed  4. Chronic CHF:  - Track weights and I/O if admitted.  For questions or updates, please contact [place] Please consult [email or web address] for contact info under Cardiology/STEMI.   Jonetta Speak, PA-C  03/22/2017 4:18 PM

## 2017-03-22 NOTE — H&P (Signed)
Cardiology History and Physical:   Patient ID: Melinda [name]; [id number]; August 22, 1931  Admit date: 03/22/2017  Date of Consult: 03/22/2017  Primary Care Provider: Crist Infante, MD  Primary Cardiologist: Dr Martinique  Primary Electrophysiologist: Dr Lovena Le   Patient Profile:  Melinda [name] is a 84 y.o. female with a hx of PAF on Eliquis, CP w/ minor CAD at cath 07/2015, DM, HTN, HLD, OSA on CPAP, who is being seen today for the evaluation of chest pain at the request of Dr Lita Mains.  She saw Dr Lovena Le yesterday and was in atrial flutter w/ 2:1 conduction. DCCV planned for 03/28/2017. No med changes were made.   History of Present Illness:  Melinda [name] was on her way to have a mammogram this pm and had onset of SSCP, pressure, at about 12:15, while driving. It was 4/10 and radiated up to her L ear and jaw. She had SOB w/ this and a HA. When she got to radiology, EMS was called. She got ASA but no other meds. She was transported to the [facility] ER, pain resolved by arrival. She SOB and headache have also resolved.  The CP was not changed by position change or deep inspiration.  Last pm, she had headache and nausea. Sx resolved by this am. She did not have N&V or diaphoresis with the chest pain.  The pain is similar to previous CP episodes. She gets these about once a month. They generally occur at bedtime.  She was admitted 12/19-12/21/2018 for chest pain. Those sx improved w/ ASA 81 mg. She was in atrial flutter during that admission. Her sotalol was stopped and metoprolol was increased. She was started on amiodarone.  No palpitations, dizzyness or presyncope. She is not aware of the atrial flutter in general, but can occasionally feel her heart beat.  No LE edema, no orthopnea or PND. She has chronic DOE, no recent change.  Her activity is mainly limited by MS issues. She uses a walker in the morning, does not generally use it in the afternoon. She falls 1-2 x month, the last time was  during the big snow recently. Has not gotten any significant injuries, if she hits her head, she gets evaluated. She feels her balance is bad, she states she is unsteady when she walks.  [name] can climb a flight of stairs without stopping, but uses both feet on each one.  No bleeding issues.       Past Medical History:  Diagnosis Date  . A-fib ([place])   . Acid reflux   . Anxiety   . Arthritis   . Cataract    surgery,bilateral  . Constipation   . Depression   . Diabetes mellitus without complication (HCC)    Pre diabetes  . Dyspnea on exertion    a. 07/2015 Echo: EF 55-60%, no rwma, mild MR, mod dil LA/RA/RV, mod TR, triv PR, PASP 93mmHg.  . Falls   . Family history of adverse reaction to anesthesia    mother always had n/v  . Hx: UTI (urinary tract infection)   . Hypercholesterolemia   . Hypertensive heart disease   . Insomnia   . Multifactorial gait disorder   . Non-obstructive CAD    a. 07/2015 Cath: LM 30-40d, LAD min irregs, LCX min irregs, RCA 30p, RA 17, RV 33/7, PA 42/19, PCWP 17.  . On home oxygen therapy    "2L w/CPAP at night" (02/24/2017)  . Osteoarthritis    a. 06/2015 s/p R total hip arthroplasty.  [name]  PAF (paroxysmal atrial fibrillation) ([place])    a. Dx 07/2015, CHA2DS2VASc = 6-->Eliquis.  . Premature atrial contractions   . Pulmonary hypertension ([place])    a. 07/2015 Echo: PASP 19mmHg.  [name] Pyloric antral stenosis    in childhood,infancy  . Sleep apnea with use of continuous positive airway pressure (CPAP) 10/19/2012   a. compliant w/ CPAP.        Past Surgical History:  Procedure Laterality Date  . ABDOMINAL HYSTERECTOMY  1970  . ABDOMINAL SURGERY     as a 31 day old baby  . CARDIAC CATHETERIZATION N/A 07/14/2015   Procedure: Right/Left Heart Cath and Coronary Angiography; Surgeon: Sanda Klein, MD; Location: [facility]; Service: Cardiovascular; Laterality: N/A;  . CARDIOVERSION N/A 11/04/2015   Procedure: CARDIOVERSION; Surgeon: Lelon Perla, MD;  Location: [facility] ENDOSCOPY; Service: Cardiovascular; Laterality: N/A;  . CARDIOVERSION N/A 02/25/2016   Procedure: CARDIOVERSION; Surgeon: Pixie Casino, MD; Location: [facility] ENDOSCOPY; Service: Cardiovascular; Laterality: N/A;  . CARDIOVERSION N/A 12/13/2016   Procedure: CARDIOVERSION; Surgeon: Fay Records, MD; Location: [facility]; Service: Cardiovascular; Laterality: N/A;  . COLONOSCOPY    . EYE SURGERY Bilateral    cataract surgery with lens implant  . INNER EAR SURGERY Left    x 3  . JOINT REPLACEMENT Right   . LAPAROSCOPIC SALPINGO OOPHERECTOMY    . NM MYOVIEW LTD  11/30/2007   normal stress nuclear study/EF-83%  . TONSILLECTOMY    . TOTAL HIP ARTHROPLASTY Right 06/26/2015   Procedure: RIGHT TOTAL HIP ARTHROPLASTY ANTERIOR APPROACH; Surgeon: Leandrew Koyanagi, MD; Location: [facility]; Service: Orthopedics; Laterality: Right;  . TRANSTHORACIC ECHOCARDIOGRAM  07/23/2009   mild concentric LVH/mild mitral insufficiency/ moderate tricuspid innsufficiency/mild pulmonary HTN/EF- 55-60%  . VAGINAL DELIVERY            Prior to Admission medications   Medication Sig Start Date End Date Taking? Authorizing Provider  acetaminophen (TYLENOL) 650 MG CR tablet Take 650-1,300 mg by mouth 2 (two) times daily as needed for pain (arthritis pain).     [provider]  amiodarone (PACERONE) 200 MG tablet Take 1 tablet (200 mg total) by mouth 2 (two) times daily. 03/04/17 03/04/18  Sherren Mocha, MD  apixaban (ELIQUIS) 5 MG TABS tablet Take 1 tablet (5 mg total) by mouth 2 (two) times daily. 07/15/15   Robbie Lis, MD  atorvastatin (LIPITOR) 40 MG tablet Take 40 mg by mouth daily.     [provider]  Calcium Carbonate-Vitamin D3 (CALCIUM 600+D3) 600-400 MG-UNIT TABS Take 1 tablet by mouth daily.    [provider]  cholecalciferol (VITAMIN D) 1000 UNITS tablet Take 1,000 Units by mouth daily.     [provider]  desipramine (NORPRAMIN) 25 MG tablet Take 25 mg by mouth daily.     [provider]  famotidine (PEPCID) 20 MG tablet Take 20 mg by mouth daily.    [provider]  glucosamine-chondroitin 500-400 MG tablet Take 1 tablet by mouth every other day.     [provider]  KLOR-CON 10 10 MEQ tablet TAKE 2 TABLETS (20 MEQ TOTAL) BY MOUTH DAILY. 11/25/16 03/21/17  Sherran Needs, NP  linaclotide Rolan Lipa) 145 MCG CAPS capsule Take 145 mcg by mouth daily before breakfast.     [provider]  LORazepam (ATIVAN) 0.5 MG tablet Take 0.5 mg by mouth at bedtime.  12/26/15   [provider]  metoprolol succinate (TOPROL XL) 25 MG 24 hr tablet Take 1.5 tablets (37.5 mg  total) by mouth 2 (two) times daily. 03/04/17   Sherren Mocha, MD  Polyethyl Glycol-Propyl Glycol (SYSTANE) 0.4-0.3 % GEL ophthalmic gel Place 1 application into both eyes at bedtime as needed (dry eyes).    [provider]  polyethylene glycol (MIRALAX / GLYCOLAX) packet Take 17 g by mouth daily. Mix in 8 oz liquid and drink    [provider]  PRESCRIPTION MEDICATION Inhale into the lungs at bedtime. Uses CPAP with oxygen    [provider]  torsemide (DEMADEX) 10 MG tablet Take 1 tablet (10 mg total) by mouth daily.  Patient taking differently: Take 20 mg by mouth every other day.  03/03/16   Lavina Hamman, MD  vitamin B-12 (CYANOCOBALAMIN) 1000 MCG tablet Take 1,000 mcg by mouth daily.    [provider]  Inpatient Medications:  Scheduled Meds:  Continuous Infusions:  PRN Meds:   Allergies:      Allergies  Allergen Reactions  . Lasix [Furosemide] Itching  . Phenobarbital Itching  . Amitiza [Lubiprostone] Itching   Social History        Socioeconomic History  . Marital status: Widowed    Spouse name: Not on file  . Number of children: 1  . Years of education: 60  . Highest education level: Not on file  Social Needs  . Financial resource strain: Not on file  . Food insecurity - worry: Not on file  . Food  insecurity - inability: Not on file  . Transportation needs - medical: Not on file  . Transportation needs - non-medical: Not on file  Occupational History  . Occupation: Retired  Tobacco Use  . Smoking status: Never Smoker  . Smokeless tobacco: Never Used  Substance and Sexual Activity  . Alcohol use: No    Alcohol/week: 0.0 oz    Comment: rare  . Drug use: No  . Sexual activity: Not on file  Other Topics Concern  . Not on file  Social History Narrative   Patient lives alone.    Patient is widowed.    Patient retired.    Patient some college.    Patient has one child.    Caffeine 1-2 cups daily avg.        Family History  Problem Relation Age of Onset  . Heart attack Father   . Heart failure Mother   . Cancer Mother    colon,kidney  . Diabetes Sister   . Hypertension Sister   . Heart failure Sister    Family Status:       Family Status  Relation Name Status  . Father  Deceased at age 8   MI  . Mother  Deceased   CHF  . Sister  Alive   Palpitations  . Sister  Alive   ROS:  Please see the history of present illness.  All other ROS reviewed and negative.  Physical Exam/Data:         Vitals:   03/22/17 1331 03/22/17 1428 03/22/17 1433 03/22/17 1500  BP:  (!) 138/103  (!) 122/91  Pulse:      Resp:   17 (!) 21  Temp:      TempSrc:      SpO2:      Weight: 160 lb 3.2 oz (72.7 kg)     Height: 5' (1.524 m)      No intake or output data in the 24 hours ending 03/22/17 1618     Filed Weights   03/22/17 1331  Weight:  160 lb 3.2 oz (72.7 kg)   Body mass index is 31.29 kg/m.  General: Well nourished, well developed, in no acute distress  HEENT: normal  Lymph: no adenopathy  Neck: no JVD  Endocrine: No thryomegaly  Vascular: No carotid bruits; 4/4 extremity pulses 2+, without bruits  Cardiac: normal S1, S2; slightly irregular R&R; no murmur  Lungs: clear to auscultation bilaterally, no wheezing, rhonchi or rales  Abd: soft, nontender, no hepatomegaly    Ext: no edema  Musculoskeletal: No deformities, BUE and BLE strength normal and equal  Skin: warm and dry  Neuro: CNs 2-12 intact, no focal abnormalities noted  Psych: Normal affect    EKG: The EKG was personally reviewed and demonstrates: Atrial flutter (relatively slow) with 2:1 conduction, Ventricular rate 102   Telemetry: Telemetry was personally reviewed and demonstrates: Atrial flutter, RVR at times    Relevant CV Studies:  ECHO: 02/24/2017  - Left ventricle: The cavity size was normal. Systolic function was  mildly reduced. The estimated ejection fraction was in the range  of 45% to 50%. Diffuse hypokinesis worse in the septal  myocardium.  - Aortic valve: There was no regurgitation.  - Mitral valve: Mildly calcified annulus. There was mild  regurgitation.  - Left atrium: The atrium was moderately dilated.  - Right ventricle: The cavity size was normal. Wall thickness was  normal. Systolic function was normal.  - Right atrium: The atrium was severely dilated.  - Atrial septum: No defect or patent foramen ovale was identified.  - Tricuspid valve: There was mild regurgitation.  - Pulmonary arteries: Systolic pressure was within the normal  range. PA peak pressure: 32 mm Hg (S).  - Pericardium, extracardiac: A small pericardial effusion was  identified. There was evidence for increased RV-LV interaction  demonstrated by respirophasic changes in transmitral velocities  and tricuspid velocities.  Impressions:  - Variation was noted in the mitral and tricuspid valve inflow.  This is likely due to atrial fibrillation rather than tamponade.  IVC collapses normally.   CATH: 07/14/2015  LM lesion, 30% stenosed.  Prox RCA lesion, 30% stenosed.  Mid LAD to Dist LAD lesion, 10% stenosed. IMPRESSIONS:  Mild coronary atherosclerosis, not hemodynamically significant.  Minimally elevated LVEDP at rest, consistent with mild diastolic dysfunction.  Mild pulmonary artery  hypertension. Transpulmonary gradient >12 mm Hg suggests intrinsic pulmonary arteriolar hypertension, consistent with a pulmonary cause for PAH and dyspnea.  RECOMMENDATION:  Check pulmonary function tests and reevaluate CPAP treatment settings for OSA.  Symptoms appear to be non-cardiac in etiology.  Statin therapy is probably appropriate.  Echocardiogram should be performed, but would be more informative when she is in NSR.  Start direct oral anticoagulant 4 hours after cath if no radial site bleeding.   Laboratory Data:  Lab 03/21/17 1504 03/22/17 1355  NA 144 138  K 3.9 3.3*  CL 99 98*  CO2 30* 30  GLUCOSE 112* 121*  BUN 22 20  CREATININE 1.06* 1.15*  CALCIUM 9.4 9.2  GFRNONAA 49* 43*  GFRAA 56* 50*  ANIONGAP  --  10      RecentLabs       Lab Results  Component Value Date   ALT 17 11/30/2016   AST 21 11/30/2016   ALKPHOS 61 11/30/2016   BILITOT 1.0 11/30/2016     Hematology LastLabs      Recent Labs  Lab 03/21/17 1504 03/22/17 1355  WBC 4.2 4.4  RBC 4.14 4.16  HGB 13.1 13.4  HCT 38.4 40.6  MCV 93 97.6  MCH 31.6 32.2  MCHC 34.1 33.0  RDW 14.5 13.8  PLT 219 195     Cardiac Enzymes LastLabs  No results for input(s): TROPONINI in the last 168 hours.    LastLabs     Recent Labs  Lab 03/22/17 1408  TROPIPOC 0.01      TSH:  RecentLabs       Lab Results  Component Value Date   TSH 2.374 11/30/2016     Lipids: RecentLabs       Lab Results  Component Value Date   CHOL 153 02/23/2017   HDL 62 02/23/2017   LDLCALC 64 02/23/2017   TRIG 137 02/23/2017   CHOLHDL 2.5 02/23/2017     HgbA1c: RecentLabs       Lab Results  Component Value Date   HGBA1C 7.0 (H) 02/23/2017     Magnesium:  LastLabs       Magnesium  Date Value Ref Range Status  11/30/2016 2.1 1.7 - 2.4 mg/dL Final      Radiology/Studies:  Dg Chest 2 View  Result Date: 03/22/2017  CLINICAL DATA: Chest pain EXAM: CHEST 2 VIEW COMPARISON:  02/23/2017. 03/11/2016. FINDINGS: Stable asymmetric elevation right hemidiaphragm. The lungs are clear without focal pneumonia, edema, pneumothorax or pleural effusion. Cardiopericardial silhouette is at upper limits of normal for size. Right hilar fullness similar to prior studies compatible with vascular anatomy. The visualized bony structures of the thorax are intact. IMPRESSION: Stable. No acute findings. Electronically Signed By: Misty Stanley M.D. On: 03/22/2017 14:40   Assessment and Plan:  Principal Problem:  1. Chest pain with moderate risk for cardiac etiology  - although pt has multiple CRFs, she has been evaluated multiple times with no cardiac cause found for her pain.  - However, her EF was lower in the 02/24/2017 echo than previous, ?2nd atrial flutter - no ischemic eval since that echo  - It is also possible that her atrial flutter rate is elevated at times and that is the reason for the chest pain.  - her ECG has not indicated spasm and she is at low risk for PE.  - cycle ez, initial troponin neg - recheck echo 6-12 weeks after pt in SR, then decide if ischemic eval indicated  Active Problems:  2. Atypical atrial flutter ([place])  - DCCV scheduled for 01/21, will move to tomorrow - no TEE needed.   3. Chronic anticoagulation  - No doses missed of Eliquis  4. Chronic CHF:  - Track weights and I/O  - continue current therapy  For questions or updates, please contact [facility]  Please consult [email or web address] for contact info under Cardiology/STEMI.  Signed,  Rosaria Ferries, PA-C  03/22/2017 4:18 PM   I have seen, examined and evaluated the patient this afternoon in the emergency room along with Rosaria Ferries, PA-C.  After reviewing all the available data and chart, we discussed the patients laboratory, study & physical findings as well as symptoms in detail. I agree with her findings, examination as well as impression recommendations as per our discussion.     Very  interesting situation with a very pleasant 84 year old woman who has a very atypical form of atrial flutter and planned upcoming cardioversion on January 21 by Dr. Lovena Le.  She has been having intermittent chest discomfort off and on for several years, and had a relatively normal cardiac catheterization not that long ago. She comes in now with an episode of chest discomfort while driving for radiology appointment. Her  chest pain is quite atypical although there is some concern for being a cardiac etiology.  I am less concerned about it being an ischemic etiology than being a manifestation of rapid atrial flutter.  When I saw her in the emergency room her heart rate was in the 100-110 range, and she was feeling somewhat dyspneic.  We will cycle cardiac enzymes to exclude ACS. Recent echo did show reduced function with septal dyssynergy, this may very well just simply be related due to ongoing atrial flutter and would probably be better checked once she has restored sinus rhythm.  For now I think the best plan is to admit her under observation status as she is already on anticoagulation with Eliquis.  We discussed with Dr. Lovena Le and he recommends proceeding with cardioversion tomorrow without TEE.  We will then see how her symptoms progress while in sinus rhythm. --If symptoms recur while in sinus rhythm, then would pursue ischemic evaluation.     Glenetta Hew, M.D., M.S. Interventional Cardiologist   Pager # [phone] Phone # [phone] [address] [place], [place] [address]

## 2017-03-22 NOTE — ED Notes (Signed)
Alert, NAD, calm, interactive, resps e/u, speaking in clear complete sentences, no dyspnea noted, skin W&D, VSS, admits to some mild itching "not new, relates to her water pill", ST on monitor, HR 104, (denies: pain, sob, nausea, dizziness or visual changes). Denies questions or needs.

## 2017-03-22 NOTE — ED Notes (Signed)
Report given to Anderson Malta, RN (ED RN in [facility])

## 2017-03-22 NOTE — ED Notes (Signed)
Patient denies pain and is resting comfortably.  

## 2017-03-22 NOTE — H&P (View-Only) (Signed)
Cardiology Consultation:   Patient ID: Melinda [name]; [id number]; 10/24/1931   Admit date: 03/22/2017 Date of Consult: 03/22/2017  Primary Care Provider: Crist Infante, MD Primary Cardiologist: Dr Melinda [name] Primary Electrophysiologist:  Dr Melinda [name]   Patient Profile:   Melinda [name] is a 84 y.o. female with a hx of PAF on Eliquis, CP w/ minor CAD at cath 07/2015, DM, HTN, HLD, OSA on CPAP, who is being seen today for the evaluation of chest pain at the request of Dr Melinda [name].  She saw Dr Melinda [name] yesterday and was in atrial flutter w/ 2:1 conduction. DCCV planned for 03/28/2017. No med changes were made.   History of Present Illness:   Melinda [name] was on her way to have a mammogram this pm and had onset of SSCP, pressure, at about 12:15, while driving. It was 4/10 and radiated up to her L ear and jaw. She had SOB w/ this and a HA. When she got to radiology, EMS was called. She got ASA but no other meds. She was transported to the [facility] ER, pain resolved by arrival. She SOB and headache have also resolved.  The CP was not changed by position change or deep inspiration.   Last pm, she had headache and nausea. Sx resolved by this am. She did not have N&V or diaphoresis with the chest pain.   The pain is similar to previous CP episodes. She gets these about once a month. They generally occur at bedtime.   She was admitted 12/19-12/21/2018 for chest pain. Those sx improved w/ ASA 81 mg. She was in atrial flutter during that admission. Her sotalol was stopped and metoprolol was increased. She was started on amiodarone.   No palpitations, dizzyness or presyncope. She is not aware of the atrial flutter in general, but can occasionally feel her heart beat.  No [name] edema, no orthopnea or PND. She has chronic DOE, no recent change.   Her activity is mainly limited by MS issues. She uses a walker in the morning, does not generally use it in the afternoon. She falls 1-2 x month, the last  time was during the big snow recently. Has not gotten any significant injuries, if she hits her head, she gets evaluated. She feels her balance is bad, she states she is unsteady when she walks.  [name] can climb a flight of stairs without stopping, but uses both feet on each one.   No bleeding issues.    Past Medical History:  Diagnosis Date  . A-fib ([place])   . Acid reflux   . Anxiety   . Arthritis   . Cataract    surgery,bilateral  . Constipation   . Depression   . Diabetes mellitus without complication (HCC)    Pre diabetes  . Dyspnea on exertion    a. 07/2015 Echo: EF 55-60%, no rwma, mild MR, mod dil LA/RA/RV, mod TR, triv PR, PASP 90mmHg.  . Falls   . Family history of adverse reaction to anesthesia    mother always had n/v  . Hx: UTI (urinary tract infection)   . Hypercholesterolemia   . Hypertensive heart disease   . Insomnia   . Multifactorial gait disorder   . Non-obstructive CAD    a. 07/2015 Cath: LM 30-40d, LAD min irregs, LCX min irregs, RCA 30p, RA 17, RV 33/7, PA 42/19, PCWP 17.  . On home oxygen therapy    "2L w/CPAP at night" (02/24/2017)  . Osteoarthritis    a. 06/2015 s/p R total  hip arthroplasty.  [name] PAF (paroxysmal atrial fibrillation) ([place])    a. Dx 07/2015, CHA2DS2VASc = 6-->Eliquis.  . Premature atrial contractions   . Pulmonary hypertension ([place])    a. 07/2015 Echo: PASP 60mmHg.  [name] Pyloric antral stenosis    in childhood,infancy  . Sleep apnea with use of continuous positive airway pressure (CPAP) 10/19/2012   a. compliant w/ CPAP.    Past Surgical History:  Procedure Laterality Date  . ABDOMINAL HYSTERECTOMY  1970  . ABDOMINAL SURGERY     as a 71 day old baby  . CARDIAC CATHETERIZATION N/A 07/14/2015   Procedure: Right/Left Heart Cath and Coronary Angiography;  Surgeon: Sanda Klein, MD;  Location: [facility];  Service: Cardiovascular;  Laterality: N/A;  . CARDIOVERSION N/A 11/04/2015   Procedure: CARDIOVERSION;  Surgeon: Lelon Perla,  MD;  Location: [facility] ENDOSCOPY;  Service: Cardiovascular;  Laterality: N/A;  . CARDIOVERSION N/A 02/25/2016   Procedure: CARDIOVERSION;  Surgeon: Pixie Casino, MD;  Location: [facility] ENDOSCOPY;  Service: Cardiovascular;  Laterality: N/A;  . CARDIOVERSION N/A 12/13/2016   Procedure: CARDIOVERSION;  Surgeon: Fay Records, MD;  Location: [facility];  Service: Cardiovascular;  Laterality: N/A;  . COLONOSCOPY    . EYE SURGERY Bilateral    cataract surgery with lens implant  . INNER EAR SURGERY Left    x 3  . JOINT REPLACEMENT Right   . LAPAROSCOPIC SALPINGO OOPHERECTOMY    . NM MYOVIEW LTD  11/30/2007   normal stress nuclear study/EF-83%  . TONSILLECTOMY    . TOTAL HIP ARTHROPLASTY Right 06/26/2015   Procedure: RIGHT TOTAL HIP ARTHROPLASTY ANTERIOR APPROACH;  Surgeon: Leandrew Koyanagi, MD;  Location: [facility];  Service: Orthopedics;  Laterality: Right;  . TRANSTHORACIC ECHOCARDIOGRAM  07/23/2009   mild concentric LVH/mild mitral insufficiency/ moderate tricuspid innsufficiency/mild pulmonary HTN/EF- 55-60%  . VAGINAL DELIVERY       Prior to Admission medications   Medication Sig Start Date End Date Taking? Authorizing Provider  acetaminophen (TYLENOL) 650 MG CR tablet Take 650-1,300 mg by mouth 2 (two) times daily as needed for pain (arthritis pain).     [provider]  amiodarone (PACERONE) 200 MG tablet Take 1 tablet (200 mg total) by mouth 2 (two) times daily. 03/04/17 03/04/18  Sherren Mocha, MD  apixaban (ELIQUIS) 5 MG TABS tablet Take 1 tablet (5 mg total) by mouth 2 (two) times daily. 07/15/15   Robbie Lis, MD  atorvastatin (LIPITOR) 40 MG tablet Take 40 mg by mouth daily.     [provider]  Calcium Carbonate-Vitamin D3 (CALCIUM 600+D3) 600-400 MG-UNIT TABS Take 1 tablet by mouth daily.    [provider]  cholecalciferol (VITAMIN D) 1000 UNITS tablet Take 1,000 Units by mouth daily.     [provider]  desipramine (NORPRAMIN) 25 MG tablet Take 25 mg by  mouth daily.    [provider]  famotidine (PEPCID) 20 MG tablet Take 20 mg by mouth daily.    [provider]  glucosamine-chondroitin 500-400 MG tablet Take 1 tablet by mouth every other day.     [provider]  KLOR-CON 10 10 MEQ tablet TAKE 2 TABLETS (20 MEQ TOTAL) BY MOUTH DAILY. 11/25/16 03/21/17  Sherran Needs, NP  linaclotide Rolan Lipa) 145 MCG CAPS capsule Take 145 mcg by mouth daily before breakfast.     [provider]  LORazepam (ATIVAN) 0.5 MG tablet Take 0.5 mg by mouth at bedtime.  12/26/15   [provider]  metoprolol succinate (TOPROL XL) 25 MG 24 hr tablet Take 1.5 tablets (37.5 mg total) by mouth 2 (two) times daily. 03/04/17   Sherren Mocha, MD  Polyethyl Glycol-Propyl Glycol (SYSTANE) 0.4-0.3 % GEL ophthalmic gel Place 1 application into both eyes at bedtime as needed (dry eyes).    [provider]  polyethylene glycol (MIRALAX / GLYCOLAX) packet Take 17 g by mouth daily. Mix in 8 oz liquid and drink    [provider]  PRESCRIPTION MEDICATION Inhale into the lungs at bedtime. Uses CPAP with oxygen    [provider]  torsemide (DEMADEX) 10 MG tablet Take 1 tablet (10 mg total) by mouth daily. Patient taking differently: Take 20 mg by mouth every other day.  03/03/16   Lavina Hamman, MD  vitamin B-12 (CYANOCOBALAMIN) 1000 MCG tablet Take 1,000 mcg by mouth daily.    [provider]    Inpatient Medications: Scheduled Meds:  Continuous Infusions:  PRN Meds:   Allergies:    Allergies  Allergen Reactions  . Lasix [Furosemide] Itching  . Phenobarbital Itching  . Amitiza [Lubiprostone] Itching    Social History:   Social History   Socioeconomic History  . Marital status: Widowed    Spouse name: Not on file  . Number of children: 1  . Years of education: 55  . Highest education level: Not on file  Social Needs  . Financial resource strain: Not on file  . Food insecurity -  worry: Not on file  . Food insecurity - inability: Not on file  . Transportation needs - medical: Not on file  . Transportation needs - non-medical: Not on file  Occupational History  . Occupation: Retired  Tobacco Use  . Smoking status: Never Smoker  . Smokeless tobacco: Never Used  Substance and Sexual Activity  . Alcohol use: No    Alcohol/week: 0.0 oz    Comment: rare  . Drug use: No  . Sexual activity: Not on file  Other Topics Concern  . Not on file  Social History Narrative   Patient lives alone.    Patient is widowed.    Patient retired.    Patient some college.    Patient has one child.    Caffeine 1-2 cups daily avg.    Family History:   Family History  Problem Relation Age of Onset  . Heart attack Father   . Heart failure Mother   . Cancer Mother        colon,kidney  . Diabetes Sister   . Hypertension Sister   . Heart failure Sister    Family Status:  Family Status  Relation Name Status  . Father  Deceased at age 64       MI  . Mother  Deceased       CHF  . Sister  Alive       Palpitations  . Sister  Alive    ROS:  Please see the history of present illness.  All other ROS reviewed and negative.     Physical Exam/Data:   Vitals:   03/22/17 1331 03/22/17 1428 03/22/17 1433 03/22/17 1500  BP:  (!) 138/103  (!) 122/91  Pulse:      Resp:   17 (!) 21  Temp:      TempSrc:      SpO2:      Weight: 160 lb 3.2 oz (72.7 kg)     Height: 5' (1.524 m)      No intake or  output data in the 24 hours ending 03/22/17 1618 Filed Weights   03/22/17 1331  Weight: 160 lb 3.2 oz (72.7 kg)   Body mass index is 31.29 kg/m.  General:  Well nourished, well developed, in no acute distress HEENT: normal Lymph: no adenopathy Neck: no JVD Endocrine:  No thryomegaly Vascular: No carotid bruits; 4/4 extremity pulses 2+, without bruits  Cardiac:  normal S1, S2; slightly irregular R&R; no murmur  Lungs:  clear to auscultation bilaterally, no wheezing, rhonchi or  rales  Abd: soft, nontender, no hepatomegaly  Ext: no edema Musculoskeletal:  No deformities, BUE and BLE strength normal and equal Skin: warm and dry  Neuro:  CNs 2-12 intact, no focal abnormalities noted Psych:  Normal affect   EKG:  The EKG was personally reviewed and demonstrates:  Atrial flutter (relatively slow) with 2:1 conduction, Ventricular rate 102 Telemetry:  Telemetry was personally reviewed and demonstrates:  Atrial flutter, RVR at times  Relevant CV Studies:  ECHO: 02/24/2017 - Left ventricle: The cavity size was normal. Systolic function was   mildly reduced. The estimated ejection fraction was in the range   of 45% to 50%. Diffuse hypokinesis worse in the septal   myocardium. - Aortic valve: There was no regurgitation. - Mitral valve: Mildly calcified annulus. There was mild   regurgitation. - Left atrium: The atrium was moderately dilated. - Right ventricle: The cavity size was normal. Wall thickness was   normal. Systolic function was normal. - Right atrium: The atrium was severely dilated. - Atrial septum: No defect or patent foramen ovale was identified. - Tricuspid valve: There was mild regurgitation. - Pulmonary arteries: Systolic pressure was within the normal   range. PA peak pressure: 32 mm Hg (S). - Pericardium, extracardiac: A small pericardial effusion was   identified. There was evidence for increased RV-LV interaction   demonstrated by respirophasic changes in transmitral velocities   and tricuspid velocities. Impressions: - Variation was noted in the mitral and tricuspid valve inflow.   This is likely due to atrial fibrillation rather than tamponade.   IVC collapses normally.  CATH:  07/14/2015  LM lesion, 30% stenosed.  Prox RCA lesion, 30% stenosed.  Mid LAD to Dist LAD lesion, 10% stenosed. IMPRESSIONS:  Mild coronary atherosclerosis, not hemodynamically significant. Minimally elevated LVEDP at rest, consistent with mild diastolic  dysfunction. Mild pulmonary artery hypertension. Transpulmonary gradient >12 mm Hg suggests intrinsic pulmonary arteriolar hypertension, consistent with a pulmonary cause for PAH and dyspnea.  RECOMMENDATION:  Check pulmonary function tests and reevaluate CPAP treatment settings for OSA. Symptoms appear to be non-cardiac in etiology. Statin therapy is probably appropriate. Echocardiogram should be performed, but would be more informative when she is in NSR. Start direct oral anticoagulant 4 hours after cath if no radial site bleeding.  Laboratory Data:  Chemistry Recent Labs  Lab 03/21/17 1504 03/22/17 1355  NA 144 138  K 3.9 3.3*  CL 99 98*  CO2 30* 30  GLUCOSE 112* 121*  BUN 22 20  CREATININE 1.06* 1.15*  CALCIUM 9.4 9.2  GFRNONAA 49* 43*  GFRAA 56* 50*  ANIONGAP  --  10    Lab Results  Component Value Date   ALT 17 11/30/2016   AST 21 11/30/2016   ALKPHOS 61 11/30/2016   BILITOT 1.0 11/30/2016   Hematology Recent Labs  Lab 03/21/17 1504 03/22/17 1355  WBC 4.2 4.4  RBC 4.14 4.16  HGB 13.1 13.4  HCT 38.4 40.6  MCV 93 97.6  MCH 31.6  32.2  MCHC 34.1 33.0  RDW 14.5 13.8  PLT 219 195   Cardiac EnzymesNo results for input(s): TROPONINI in the last 168 hours.  Recent Labs  Lab 03/22/17 1408  TROPIPOC 0.01    TSH:  Lab Results  Component Value Date   TSH 2.374 11/30/2016   Lipids: Lab Results  Component Value Date   CHOL 153 02/23/2017   HDL 62 02/23/2017   LDLCALC 64 02/23/2017   TRIG 137 02/23/2017   CHOLHDL 2.5 02/23/2017   HgbA1c: Lab Results  Component Value Date   HGBA1C 7.0 (H) 02/23/2017   Magnesium:  Magnesium  Date Value Ref Range Status  11/30/2016 2.1 1.7 - 2.4 mg/dL Final     Radiology/Studies:  Dg Chest 2 View  Result Date: 03/22/2017 CLINICAL DATA:  Chest pain EXAM: CHEST  2 VIEW COMPARISON:  02/23/2017.  03/11/2016. FINDINGS: Stable asymmetric elevation right hemidiaphragm. The lungs are clear without focal pneumonia,  edema, pneumothorax or pleural effusion. Cardiopericardial silhouette is at upper limits of normal for size. Right hilar fullness similar to prior studies compatible with vascular anatomy. The visualized bony structures of the thorax are intact. IMPRESSION: Stable.  No acute findings. Electronically Signed   By: Misty Stanley M.D.   On: 03/22/2017 14:40    Assessment and Plan:   Principal Problem: 1.  Chest pain with moderate risk for cardiac etiology - although pt has multiple CRFs, she has been evaluated multiple times with no cardiac cause found for her pain.  - However, her EF was lower in the 02/24/2017 echo than previous, ? Significant WMA - no ischemic eval since that echo - It is also possible that her atrial flutter rate is elevated at times and that is the reason for the chest pain.  - her ECG has not indicated spasm and she is at low risk for PE.   - will review data w/ MD and decide on eval.    Active Problems: 2.  Atypical atrial flutter ([place]) - pt has DCCV scheduled for 01/21 - MD advise if we should schedule for tomorrow. - no TEE needed. - if d/c w/ f/u 01/21 as arranged is best plan, increase Toprol XL from 37.5>>50 mg bid  3. Chronic anticoagulation - No doses missed  4. Chronic CHF:  - Track weights and I/O if admitted.  For questions or updates, please contact [place] Please consult [email or web address] for contact info under Cardiology/STEMI.   Jonetta Speak, PA-C  03/22/2017 4:18 PM

## 2017-03-22 NOTE — ED Provider Notes (Signed)
[facility] EMERGENCY DEPARTMENT Provider Note   CSN: [id number] Arrival date & time: 03/22/17  1321     History   Chief Complaint Chief Complaint  Patient presents with  . Chest Pain    HPI Melinda [name] is a 84 y.o. female.  Patient with h/o atrial flutter/atrial fibrillation on amiodarone and Eliquis presents to the ED today with chest pain.  Patient reports approximately 1 hour history of "pain over her heart" with radiation to her left neck and jaw today.  This occurred while she was going to mammography appointment.  EMS was called for the patient.  She was given aspirin.  Pain gradually resolved.  She did not have any associated shortness of breath, diaphoresis, exertional pain.  No recent fevers or cough.  Patient did have an episode of nausea and headache last night without any chest pain.  No increased swelling in her legs, lightheadedness or syncope.  No abdominal pain or diarrhea.   Last cath in 07/2015.  Patient is scheduled for elective DC cardioversion in the next couple of weeks.  Dr. Burt Knack is her cardiologist and Dr. Lovena Le is her electrophysiologist.  5/17: Angiographic Findings:  1. The left main coronary artery is heavily calcified and has significant atherosclerosis, but no more than a 30-40% distal stenosis. It bifurcates in the usual fashion into the left anterior descending artery and left circumflex coronary artery.  2. The left anterior descending artery is a large vessel that reaches the apex and generates 2 major diagonal branches. There is evidence of mild luminal irregularities and moderate calcification. No hemodynamically meaningful stenoses are seen. 3. The left circumflex coronary artery is a medium-size vessel non dominant vessel that generates 2 major oblique marginal arteries. There is evidence of mild luminal irregularities and mild calcification. No hemodynamically meaningful stenoses are seen. 4. The right coronary artery is  a large-size dominant vessel that generates a two large posterior lateral ventricular branches as well as a bifurcated "twin" posterior descending artery system. There is evidence of mild luminal irregularities and moderate calcification. There is a proximal 30% stenosis, but no hemodynamically meaningful stenoses are seen.  5. The left ventricle is not injected. The ascending aorta appears normal. There is no aortic valve stenosis by pullback. The left ventricular end-diastolic pressure is 70-35 mm Hg.       Past Medical History:  Diagnosis Date  . A-fib ([place])   . Acid reflux   . Anxiety   . Arthritis   . Cataract    surgery,bilateral  . Constipation   . Depression   . Diabetes mellitus without complication (HCC)    Pre diabetes  . Dyspnea on exertion    a. 07/2015 Echo: EF 55-60%, no rwma, mild MR, mod dil LA/RA/RV, mod TR, triv PR, PASP 12mmHg.  . Falls   . Family history of adverse reaction to anesthesia    mother always had n/v  . Hx: UTI (urinary tract infection)   . Hypercholesterolemia   . Hypertensive heart disease   . Insomnia   . Multifactorial gait disorder   . Non-obstructive CAD    a. 07/2015 Cath: LM 30-40d, LAD min irregs, LCX min irregs, RCA 30p, RA 17, RV 33/7, PA 42/19, PCWP 17.  . On home oxygen therapy    "2L w/CPAP at night" (02/24/2017)  . Osteoarthritis    a. 06/2015 s/p R total hip arthroplasty.  [name] PAF (paroxysmal atrial fibrillation) ([place])    a. Dx 07/2015, CHA2DS2VASc = 6-->Eliquis.  [name]  Premature atrial contractions   . Pulmonary hypertension ([place])    a. 07/2015 Echo: PASP 35mmHg.  [name] Pyloric antral stenosis    in childhood,infancy  . Sleep apnea with use of continuous positive airway pressure (CPAP) 10/19/2012   a. compliant w/ CPAP.    Patient Active Problem List   Diagnosis Date Noted  . Uncontrolled atrial fibrillation ([place]) 02/24/2017  . GERD (gastroesophageal reflux disease) 02/23/2017  . Diabetes ([place]) 02/23/2017  . Atypical atrial flutter  ([place])   . Prolonged Q-T interval on ECG   . Pain in right hip 06/22/2016  . Encounter for monitoring sotalol therapy 05/31/2016  . Chest pain   . CHF exacerbation ([place]) 02/29/2016  . Acute respiratory failure with hypoxia ([place]) 02/29/2016  . Persistent atrial fibrillation ([place])   . Hypertensive heart disease   . Hypercholesterolemia   . Pulmonary hypertension ([place])   . Dyspnea on exertion   . Non-obstructive CAD   . Chest pain, moderate coronary artery risk 07/14/2015  . Pain in the chest   . PAF (paroxysmal atrial fibrillation) (HCC)   . Pulmonary arterial hypertension ([place])   . Chest pain with moderate risk for cardiac etiology 07/08/2015  . Dyspnea 07/08/2015  . Essential hypertension 07/08/2015  . Hyperlipidemia 07/08/2015  . Osteoarthritis of right hip 06/26/2015  . Hip joint replacement status 06/26/2015  . OSA on CPAP 03/27/2015  . Ataxia 03/27/2015  . Falls 03/27/2015  . Insomnia, controlled 03/27/2015  . Insomnia with sleep apnea 03/22/2014  . Adjustment disorder with mixed anxiety and depressed mood 03/22/2014  . Sleep apnea with use of continuous positive airway pressure (CPAP) 10/19/2012  . Multifactorial gait disorder     Past Surgical History:  Procedure Laterality Date  . ABDOMINAL HYSTERECTOMY  1970  . ABDOMINAL SURGERY     as a 61 day old baby  . CARDIAC CATHETERIZATION N/A 07/14/2015   Procedure: Right/Left Heart Cath and Coronary Angiography;  Surgeon: Sanda Klein, MD;  Location: [facility];  Service: Cardiovascular;  Laterality: N/A;  . CARDIOVERSION N/A 11/04/2015   Procedure: CARDIOVERSION;  Surgeon: Lelon Perla, MD;  Location: [facility] ENDOSCOPY;  Service: Cardiovascular;  Laterality: N/A;  . CARDIOVERSION N/A 02/25/2016   Procedure: CARDIOVERSION;  Surgeon: Pixie Casino, MD;  Location: [facility] ENDOSCOPY;  Service: Cardiovascular;  Laterality: N/A;  . CARDIOVERSION N/A 12/13/2016   Procedure: CARDIOVERSION;  Surgeon: Fay Records, MD;  Location:  [facility];  Service: Cardiovascular;  Laterality: N/A;  . COLONOSCOPY    . EYE SURGERY Bilateral    cataract surgery with lens implant  . INNER EAR SURGERY Left    x 3  . JOINT REPLACEMENT Right   . LAPAROSCOPIC [place]PINGO OOPHERECTOMY    . NM MYOVIEW LTD  11/30/2007   normal stress nuclear study/EF-83%  . TONSILLECTOMY    . TOTAL HIP ARTHROPLASTY Right 06/26/2015   Procedure: RIGHT TOTAL HIP ARTHROPLASTY ANTERIOR APPROACH;  Surgeon: Leandrew Koyanagi, MD;  Location: [facility];  Service: Orthopedics;  Laterality: Right;  . TRANSTHORACIC ECHOCARDIOGRAM  07/23/2009   mild concentric LVH/mild mitral insufficiency/ moderate tricuspid innsufficiency/mild pulmonary HTN/EF- 55-60%  . VAGINAL DELIVERY      OB History    No data available       Home Medications    Prior to Admission medications   Medication Sig Start Date End Date Taking? Authorizing Provider  acetaminophen (TYLENOL) 650 MG CR tablet Take 650-1,300 mg by mouth 2 (two) times daily as needed for pain (arthritis  pain).     [provider]  amiodarone (PACERONE) 200 MG tablet Take 1 tablet (200 mg total) by mouth 2 (two) times daily. 03/04/17 03/04/18  Sherren Mocha, MD  apixaban (ELIQUIS) 5 MG TABS tablet Take 1 tablet (5 mg total) by mouth 2 (two) times daily. 07/15/15   Robbie Lis, MD  atorvastatin (LIPITOR) 40 MG tablet Take 40 mg by mouth daily.     [provider]  Calcium Carbonate-Vitamin D3 (CALCIUM 600+D3) 600-400 MG-UNIT TABS Take 1 tablet by mouth daily.    [provider]  cholecalciferol (VITAMIN D) 1000 UNITS tablet Take 1,000 Units by mouth daily.     [provider]  desipramine (NORPRAMIN) 25 MG tablet Take 25 mg by mouth daily.    [provider]  famotidine (PEPCID) 20 MG tablet Take 20 mg by mouth daily.    [provider]  glucosamine-chondroitin 500-400 MG tablet Take 1 tablet by mouth every other day.     [provider]  KLOR-CON 10 10 MEQ tablet  TAKE 2 TABLETS (20 MEQ TOTAL) BY MOUTH DAILY. 11/25/16 03/21/17  Sherran Needs, NP  linaclotide Rolan Lipa) 145 MCG CAPS capsule Take 145 mcg by mouth daily before breakfast.     [provider]  LORazepam (ATIVAN) 0.5 MG tablet Take 0.5 mg by mouth at bedtime.  12/26/15   [provider]  metoprolol succinate (TOPROL XL) 25 MG 24 hr tablet Take 1.5 tablets (37.5 mg total) by mouth 2 (two) times daily. 03/04/17   Sherren Mocha, MD  Polyethyl Glycol-Propyl Glycol (SYSTANE) 0.4-0.3 % GEL ophthalmic gel Place 1 application into both eyes at bedtime as needed (dry eyes).    [provider]  polyethylene glycol (MIRALAX / GLYCOLAX) packet Take 17 g by mouth daily. Mix in 8 oz liquid and drink    [provider]  PRESCRIPTION MEDICATION Inhale into the lungs at bedtime. Uses CPAP with oxygen    [provider]  torsemide (DEMADEX) 10 MG tablet Take 1 tablet (10 mg total) by mouth daily. Patient taking differently: Take 20 mg by mouth every other day.  03/03/16   Lavina Hamman, MD  vitamin B-12 (CYANOCOBALAMIN) 1000 MCG tablet Take 1,000 mcg by mouth daily.    [provider]    Family History Family History  Problem Relation Age of Onset  . Heart attack Father   . Heart failure Mother   . Cancer Mother        colon,kidney  . Diabetes Sister   . Hypertension Sister   . Heart failure Sister     Social History Social History   Tobacco Use  . Smoking status: Never Smoker  . Smokeless tobacco: Never Used  Substance Use Topics  . Alcohol use: No    Alcohol/week: 0.0 oz    Comment: rare  . Drug use: No     Allergies   Lasix [furosemide]; Phenobarbital; and Amitiza [lubiprostone]   Review of Systems Review of Systems  Constitutional: Negative for diaphoresis and fever.  Eyes: Negative for redness.  Respiratory: Negative for cough and shortness of breath.   Cardiovascular: Positive for chest pain. Negative for palpitations and  leg swelling.  Gastrointestinal: Negative for abdominal pain, nausea and vomiting.  Genitourinary: Negative for dysuria.  Musculoskeletal: Negative for back pain and neck pain.  Skin: Negative for rash.  Neurological: Positive for headaches (resolved). Negative for syncope and light-headedness.  Psychiatric/Behavioral: The patient is not nervous/anxious.  Physical Exam Updated Vital Signs BP (!) 135/91 (BP Location: Right Arm)   Pulse (!) 103   Temp 97.9 F (36.6 C) (Oral)   Resp 16   Ht 5' (1.524 m)   Wt 72.7 kg (160 lb 3.2 oz)   SpO2 94%   BMI 31.29 kg/m   Physical Exam  Constitutional: She appears well-developed and well-nourished.  HENT:  Head: Normocephalic and atraumatic.  Mouth/Throat: Mucous membranes are normal. Mucous membranes are not dry.  Eyes: Conjunctivae are normal.  Neck: Trachea normal and normal range of motion. Neck supple. Normal carotid pulses and no JVD present. No muscular tenderness present. Carotid bruit is not present. No tracheal deviation present.  Cardiovascular: Regular rhythm, S1 normal, S2 normal, normal heart sounds and intact distal pulses. Tachycardia present. Exam reveals no decreased pulses.  No murmur heard. Mild tachycardia.  Pulmonary/Chest: Effort normal. No respiratory distress. She has no wheezes. She exhibits no tenderness.  Abdominal: Soft. Normal aorta and bowel sounds are normal. There is no tenderness. There is no rebound and no guarding.  Musculoskeletal: Normal range of motion.       Right lower leg: She exhibits no edema.       Left lower leg: She exhibits no edema.  Neurological: She is alert.  Skin: Skin is warm and dry. She is not diaphoretic. No cyanosis. No pallor.  Psychiatric: She has a normal mood and affect.  Nursing note and vitals reviewed.    ED Treatments / Results  Labs (all labs ordered are listed, but only abnormal results are displayed) Labs Reviewed  BASIC METABOLIC PANEL - Abnormal; Notable for  the following components:      Result Value   Potassium 3.3 (*)    Chloride 98 (*)    Glucose, Bld 121 (*)    Creatinine, Ser 1.15 (*)    GFR calc non Af Amer 43 (*)    GFR calc Af Amer 50 (*)    All other components within normal limits  CBC  I-STAT TROPONIN, ED  I-STAT TROPONIN, ED    Radiology Dg Chest 2 View  Result Date: 03/22/2017 CLINICAL DATA:  Chest pain EXAM: CHEST  2 VIEW COMPARISON:  02/23/2017.  03/11/2016. FINDINGS: Stable asymmetric elevation right hemidiaphragm. The lungs are clear without focal pneumonia, edema, pneumothorax or pleural effusion. Cardiopericardial silhouette is at upper limits of normal for size. Right hilar fullness similar to prior studies compatible with vascular anatomy. The visualized bony structures of the thorax are intact. IMPRESSION: Stable.  No acute findings. Electronically Signed   By: Misty Stanley M.D.   On: 03/22/2017 14:40    Procedures Procedures (including critical care time)  Medications Ordered in ED Medications  potassium chloride SA (K-DUR,KLOR-CON) CR tablet 40 mEq (not administered)     Initial Impression / Assessment and Plan / ED Course  I have reviewed the triage vital signs and the nursing notes.  Pertinent labs & imaging results that were available during my care of the patient were reviewed by me and considered in my medical decision making (see chart for details).     Patient seen and examined. Work-up initiated.   Vital signs reviewed and are as follows: BP (!) 122/91   Pulse (!) 103   Temp 97.9 F (36.6 C) (Oral)   Resp (!) 21   Ht 5' (1.524 m)   Wt 72.7 kg (160 lb 3.2 oz)   SpO2 94%   BMI 31.29 kg/m   Discussed with Dr. Lita Mains.  Spoke with cardiology for consult.   4:32 PM Handoff to [place] PA-C at shift change. Pending final reccs from cardiology.   Final Clinical Impressions(s) / ED Diagnoses   Final diagnoses:  Precordial pain   Pending completion of work-up and cards reccs.    ED Discharge Orders    None       Carlisle Cater, Hershal Coria 03/22/17 1633    Julianne Rice, MD 03/25/17 [phone]

## 2017-03-22 NOTE — ED Notes (Signed)
ED Provider at bedside. 

## 2017-03-23 ENCOUNTER — Encounter (HOSPITAL_COMMUNITY): Payer: Self-pay | Admitting: *Deleted

## 2017-03-23 ENCOUNTER — Encounter (HOSPITAL_COMMUNITY)
Admission: EM | Disposition: A | Discharge status: Home / Self Care | Payer: Self-pay | Source: Home / Self Care | Attending: Cardiology

## 2017-03-23 ENCOUNTER — Observation Stay (HOSPITAL_COMMUNITY): Payer: Medicare HMO | Admitting: Anesthesiology

## 2017-03-23 ENCOUNTER — Other Ambulatory Visit: Payer: Self-pay

## 2017-03-23 DIAGNOSIS — R579 Shock, unspecified: Secondary | ICD-10-CM

## 2017-03-23 DIAGNOSIS — I4892 Unspecified atrial flutter: Secondary | ICD-10-CM

## 2017-03-23 HISTORY — PX: CARDIOVERSION: SHX1299

## 2017-03-23 LAB — CBC
HEMATOCRIT: 38.6 % (ref 36.0–46.0)
HEMOGLOBIN: 12.5 g/dL (ref 12.0–15.0)
MCH: 31.7 pg (ref 26.0–34.0)
MCHC: 32.4 g/dL (ref 30.0–36.0)
MCV: 98 fL (ref 78.0–100.0)
Platelets: 187 10*3/uL (ref 150–400)
RBC: 3.94 MIL/uL (ref 3.87–5.11)
RDW: 14.1 % (ref 11.5–15.5)
WBC: 4.9 10*3/uL (ref 4.0–10.5)

## 2017-03-23 LAB — COMPREHENSIVE METABOLIC PANEL
ALK PHOS: 57 U/L (ref 38–126)
ALT: 19 U/L (ref 14–54)
ANION GAP: 10 (ref 5–15)
AST: 20 U/L (ref 15–41)
Albumin: 3.3 g/dL — ABNORMAL LOW (ref 3.5–5.0)
BILIRUBIN TOTAL: 1.3 mg/dL — AB (ref 0.3–1.2)
BUN: 19 mg/dL (ref 6–20)
CALCIUM: 8.9 mg/dL (ref 8.9–10.3)
CO2: 27 mmol/L (ref 22–32)
Chloride: 103 mmol/L (ref 101–111)
Creatinine, Ser: 1.18 mg/dL — ABNORMAL HIGH (ref 0.44–1.00)
GFR, EST AFRICAN AMERICAN: 48 mL/min — AB (ref >60)
GFR, EST NON AFRICAN AMERICAN: 41 mL/min — AB (ref >60)
Glucose, Bld: 133 mg/dL — ABNORMAL HIGH (ref 65–99)
Potassium: 4.4 mmol/L (ref 3.5–5.1)
SODIUM: 140 mmol/L (ref 135–145)
TOTAL PROTEIN: 6.1 g/dL — AB (ref 6.5–8.1)

## 2017-03-23 LAB — TROPONIN I: Troponin I: <0.03 ng/mL (ref <0.03)

## 2017-03-23 LAB — GLUCOSE, CAPILLARY: GLUCOSE-CAPILLARY: 172 mg/dL — AB (ref 65–99)

## 2017-03-23 LAB — CBG MONITORING, ED
GLUCOSE-CAPILLARY: 137 mg/dL — AB (ref 65–99)
Glucose-Capillary: 127 mg/dL — ABNORMAL HIGH (ref 65–99)
Glucose-Capillary: 141 mg/dL — ABNORMAL HIGH (ref 65–99)

## 2017-03-23 LAB — MRSA PCR SCREENING: MRSA BY PCR: NEGATIVE

## 2017-03-23 LAB — TSH: TSH: 3.334 u[IU]/mL (ref 0.350–4.500)

## 2017-03-23 SURGERY — CARDIOVERSION
Anesthesia: General

## 2017-03-23 MED ORDER — ZOLPIDEM TARTRATE 5 MG PO TABS: 5.0000 mg | ORAL_TABLET | Freq: Every evening | ORAL | Status: DC | PRN

## 2017-03-23 MED ORDER — ALPRAZOLAM 0.25 MG PO TABS: 0.2500 mg | ORAL_TABLET | Freq: Two times a day (BID) | ORAL | Status: DC | PRN

## 2017-03-23 MED ORDER — ONDANSETRON HCL 4 MG/2ML IJ SOLN
4.0000 mg | Freq: Four times a day (QID) | INTRAMUSCULAR | Status: DC | PRN
  Administered 2017-03-23: 4 mg via INTRAVENOUS
  Filled 2017-03-23: qty 2

## 2017-03-23 MED ORDER — NOREPINEPHRINE BITARTRATE 1 MG/ML IV SOLN
INTRAVENOUS | Status: DC | PRN
  Administered 2017-03-23: 3 ug/min via INTRAVENOUS

## 2017-03-23 MED ORDER — EPHEDRINE SULFATE 50 MG/ML IJ SOLN
INTRAMUSCULAR | Status: DC | PRN
  Administered 2017-03-23: 15 mg via INTRAVENOUS
  Administered 2017-03-23 (×2): 10 mg via INTRAVENOUS
  Administered 2017-03-23: 15 mg via INTRAVENOUS

## 2017-03-23 MED ORDER — INSULIN ASPART 100 UNIT/ML ~~LOC~~ SOLN
0.0000 [IU] | Freq: Three times a day (TID) | SUBCUTANEOUS | Status: DC
  Administered 2017-03-23 – 2017-03-24 (×3): 2 [IU] via SUBCUTANEOUS
  Filled 2017-03-23 (×2): qty 1

## 2017-03-23 MED ORDER — LACTATED RINGERS IV SOLN
INTRAVENOUS | Status: DC | PRN
  Administered 2017-03-23: 14:00:00 via INTRAVENOUS

## 2017-03-23 MED ORDER — PHENYLEPHRINE HCL 10 MG/ML IJ SOLN
INTRAMUSCULAR | Status: DC | PRN
  Administered 2017-03-23: 120 ug via INTRAVENOUS
  Administered 2017-03-23 (×2): 80 ug via INTRAVENOUS
  Administered 2017-03-23 (×2): 120 ug via INTRAVENOUS

## 2017-03-23 MED ORDER — DOPAMINE-DEXTROSE 3.2-5 MG/ML-% IV SOLN
0.0000 ug/kg/min | INTRAVENOUS | Status: DC
  Administered 2017-03-23: 3 ug/kg/min via INTRAVENOUS
  Filled 2017-03-23: qty 250

## 2017-03-23 MED ORDER — ATROPINE SULFATE 0.4 MG/ML IJ SOLN
INTRAMUSCULAR | Status: DC | PRN
  Administered 2017-03-23 (×3): 0.4 mg via INTRAVENOUS

## 2017-03-23 MED ORDER — LIDOCAINE HCL (CARDIAC) 20 MG/ML IV SOLN
INTRAVENOUS | Status: DC | PRN
  Administered 2017-03-23: 40 mg via INTRATRACHEAL

## 2017-03-23 MED ORDER — NITROGLYCERIN 0.4 MG SL SUBL: 0.4000 mg | SUBLINGUAL_TABLET | SUBLINGUAL | Status: DC | PRN

## 2017-03-23 MED ORDER — SODIUM CHLORIDE 0.9 % IV SOLN
INTRAVENOUS | Status: DC | PRN
  Administered 2017-03-23 (×2): via INTRAVENOUS

## 2017-03-23 MED ORDER — ACETAMINOPHEN 325 MG PO TABS: 650.0000 mg | ORAL_TABLET | ORAL | Status: DC | PRN

## 2017-03-23 MED ORDER — DOPAMINE-DEXTROSE 1.6-5 MG/ML-% IV SOLN: INTRAVENOUS | Status: DC | PRN

## 2017-03-23 MED ORDER — PROPOFOL 10 MG/ML IV BOLUS
INTRAVENOUS | Status: DC | PRN
  Administered 2017-03-23: 65 mg via INTRAVENOUS

## 2017-03-23 NOTE — Progress Notes (Addendum)
Progress Note  Patient Name: Melinda [name] Date of Encounter: 03/23/2017  Primary Cardiologist: Peter Martinique, MD  EP: Dr. Lovena Le   Subjective   Feeling better. CP resolved. No dyspnea. Plan is for DCCV today.   Inpatient Medications    Scheduled Meds: . amiodarone  200 mg Oral BID  . apixaban  5 mg Oral BID  . atorvastatin  40 mg Oral Daily  . calcium-vitamin D  1 tablet Oral Q breakfast  . cholecalciferol  1,000 Units Oral Daily  . desipramine  25 mg Oral Daily  . famotidine  20 mg Oral Daily  . insulin aspart  0-15 Units Subcutaneous TID WC  . linaclotide  145 mcg Oral QAC breakfast  . LORazepam  0.5 mg Oral QHS  . metoprolol succinate  37.5 mg Oral BID  . polyethylene glycol  17 g Oral QHS  . polyvinyl alcohol  1 drop Both Eyes QHS  . potassium chloride  20 mEq Oral Daily  . torsemide  10 mg Oral Daily  . vitamin B-12  1,000 mcg Oral Daily   Continuous Infusions:  PRN Meds: acetaminophen, ALPRAZolam, nitroGLYCERIN, ondansetron (ZOFRAN) IV, zolpidem   Vital Signs    Vitals:   03/23/17 0500 03/23/17 0600 03/23/17 0700 03/23/17 0900  BP: 122/78 127/89 (!) 132/94 105/69  Pulse: 98 99 (!) 102 (!) 104  Resp: (!) 23 20 (!) 21 20  Temp:      TempSrc:      SpO2: 97% 96% 98% 95%  Weight:      Height:       No intake or output data in the 24 hours ending 03/23/17 1109 Filed Weights   03/22/17 1331  Weight: 160 lb 3.2 oz (72.7 kg)    Telemetry    Atrial flutter 102 - Personally Reviewed  ECG    Atrial flutter - Personally Reviewed  Physical Exam   GEN: No acute distress.   Neck: No JVD Cardiac: irregular rhythm, mildly tachy rate, no murmurs, rubs, or gallops.  Respiratory: Clear to auscultation bilaterally. GI: Soft, nontender, non-distended  MS: No edema; No deformity. Neuro:  Nonfocal  Psych: Normal affect   Labs    Chemistry Recent Labs  Lab 03/21/17 1504 03/22/17 1355 03/23/17 0334  NA 144 138 140  K 3.9 3.3* 4.4  CL 99 98*  103  CO2 30* 30 27  GLUCOSE 112* 121* 133*  BUN 22 20 19   CREATININE 1.06* 1.15* 1.18*  CALCIUM 9.4 9.2 8.9  PROT  --   --  6.1*  ALBUMIN  --   --  3.3*  AST  --   --  20  ALT  --   --  19  ALKPHOS  --   --  57  BILITOT  --   --  1.3*  GFRNONAA 49* 43* 41*  GFRAA 56* 50* 48*  ANIONGAP  --  10 10     Hematology Recent Labs  Lab 03/21/17 1504 03/22/17 1355 03/23/17 0334  WBC 4.2 4.4 4.9  RBC 4.14 4.16 3.94  HGB 13.1 13.4 12.5  HCT 38.4 40.6 38.6  MCV 93 97.6 98.0  MCH 31.6 32.2 31.7  MCHC 34.1 33.0 32.4  RDW 14.5 13.8 14.1  PLT 219 195 187    Cardiac Enzymes Recent Labs  Lab 03/22/17 2235 03/23/17 0334 03/23/17 0920  TROPONINI <0.03 <0.03 <0.03    Recent Labs  Lab 03/22/17 1408 03/22/17 1643  TROPIPOC 0.01 0.01     BNPNo results  for input(s): BNP, PROBNP in the last 168 hours.   DDimer No results for input(s): DDIMER in the last 168 hours.   Radiology    Dg Chest 2 View  Result Date: 03/22/2017 CLINICAL DATA:  Chest pain EXAM: CHEST  2 VIEW COMPARISON:  02/23/2017.  03/11/2016. FINDINGS: Stable asymmetric elevation right hemidiaphragm. The lungs are clear without focal pneumonia, edema, pneumothorax or pleural effusion. Cardiopericardial silhouette is at upper limits of normal for size. Right hilar fullness similar to prior studies compatible with vascular anatomy. The visualized bony structures of the thorax are intact. IMPRESSION: Stable.  No acute findings. Electronically Signed   By: Misty Stanley M.D.   On: 03/22/2017 14:40    Cardiac Studies   ECHO: 02/24/2017 - Left ventricle: The cavity size was normal. Systolic function was mildly reduced. The estimated ejection fraction was in the range of 45% to 50%. Diffuse hypokinesis worse in the septal myocardium. - Aortic valve: There was no regurgitation. - Mitral valve: Mildly calcified annulus. There was mild regurgitation. - Left atrium: The atrium was moderately dilated. - Right  ventricle: The cavity size was normal. Wall thickness was normal. Systolic function was normal. - Right atrium: The atrium was severely dilated. - Atrial septum: No defect or patent foramen ovale was identified. - Tricuspid valve: There was mild regurgitation. - Pulmonary arteries: Systolic pressure was within the normal range. PA peak pressure: 32 mm Hg (S). - Pericardium, extracardiac: A small pericardial effusion was identified. There was evidence for increased RV-LV interaction demonstrated by respirophasic changes in transmitral velocities and tricuspid velocities. Impressions: - Variation was noted in the mitral and tricuspid valve inflow. This is likely due to atrial fibrillation rather than tamponade. IVC collapses normally.  CATH:  07/14/2015  LM lesion, 30% stenosed.  Prox RCA lesion, 30% stenosed.  Mid LAD to Dist LAD lesion, 10% stenosed. IMPRESSIONS: Mild coronary atherosclerosis, not hemodynamically significant. Minimally elevated LVEDP at rest, consistent with mild diastolic dysfunction. Mild pulmonary artery hypertension. Transpulmonary gradient >12 mm Hg suggests intrinsic pulmonary arteriolar hypertension, consistent with a pulmonary cause for PAH and dyspnea. RECOMMENDATION: Check pulmonary function tests and reevaluate CPAP treatment settings for OSA. Symptoms appear to be non-cardiac in etiology. Statin therapy is probably appropriate. Echocardiogram should be performed, but would be more informative when she is in NSR. Start direct oral anticoagulant 4 hours after cath if no radial site bleeding.   Patient Profile      84 y.o. female with a hx of PAF on Eliquis, CP w/ minor CAD at cath 07/2015, DM, HTN, HLD, OSA on CPAP, admitted for chest pain and atrial flutter w/ RVR.   Assessment & Plan    1. Atrial Flutter: plan for DCCV today. Pt has been on Eliquis. No indication for TEE, per Dr. Lovena Le. Continue amiodarone, metoprolol and  Eliquis.   2. Chest Pain: Chest Pain resolved. Troponins are negative x 3. [facility] 07/2016 showed mild CAD, not hemodynamically significant (LM 30%, prox RCA 30%, mid-distal LAD 10%). Negative troponins argue against acute plaque rupture. Suspect symptoms were 2/2 rapid atrial flutter.   3. Systolic Dysfunction: slight reduction in LVEF, down from 60-65% previously to 45-50%. Diffuse hypokinesis worse in the septal myocardium. Suspect this may be tachy mediated from rapid atrial flutter. Plan to repeat 2D echo as an outpatient after NSR is restored.   For questions or updates, please contact [place] Please consult [email or web address] for contact info under Cardiology/STEMI.      Signed, Lyda Jester, PA-C  03/23/2017, 11:09 AM    Personally seen and examined. Agree with above.  No cp, no sob Exam: irreg irreg mildly tachy 101 on tele personally reviewed.  Labs reviewed  AFLUTTER, parox, atypical   - Not missed any doses of Eliquis  - OK for DCCV (Dr. Ellyn Hack discussed with Dr. Lovena Le)  CP  - resolved  - prior cath reviewed, non obstructive dz.   OK for DC after DCCV Repeat ECHO as outpatient.   Candee Furbish, MD

## 2017-03-23 NOTE — Anesthesia Postprocedure Evaluation (Signed)
Anesthesia Post Note  Patient: Alayshia Marini Weare  Procedure(s) Performed: CARDIOVERSION (N/A )     Patient location during evaluation: Endoscopy Anesthesia Type: General Level of consciousness: awake and alert Pain management: pain level controlled Vital Signs Assessment: post-procedure vital signs reviewed and stable Respiratory status: spontaneous breathing, nonlabored ventilation, respiratory function stable and patient connected to nasal cannula oxygen Cardiovascular status: blood pressure returned to baseline and stable Postop Assessment: no apparent nausea or vomiting Anesthetic complications: no    Last Vitals:  Vitals:   03/23/17 1300 03/23/17 1335  BP:  132/79  Pulse: (!) 104 (!) 103  Resp: (!) 25 (!) 24  Temp:  36.8 C  SpO2: 100% 97%    Last Pain:  Vitals:   03/23/17 1335  TempSrc: Oral  PainSc:                  Shannyn Jankowiak,JAMES TERRILL

## 2017-03-23 NOTE — CV Procedure (Signed)
   Electrical Cardioversion Procedure Note Melinda [name] [id number] August 15, 1931  Procedure: Electrical Cardioversion Indications:  Atrial Flutter  Time Out: Verified patient identification, verified procedure,medications/allergies/relevent history reviewed, required imaging and test results available.  Performed  Procedure Details  The patient was NPO after midnight. Anesthesia was administered at the beside  by [facility] with 65mg  of propofol and Lidocaine 40mg .  Cardioversion was done with synchronized biphasic defibrillation with AP pads with 150watts.  The patient converted to normal sinus rhythm. The patient tolerated the procedure well   IMPRESSION:  Successful cardioversion of atrial flutter    Denni France 03/23/2017, 12:17 PM

## 2017-03-23 NOTE — OR Nursing (Addendum)
Hypotensive and bradycardic.  Patient given neo synephrine, and  0.8 mg atropine and Efedron by CRNA,  IVF increased.  500 cc bolus of NS give BP 66/25  Dr. Radford Pax here.  Dopamine 27mcg /minute. 1421 dopamine IV 6 mcg min.   Aline placed in left radial. Levophed drip started.  Patient transferred in CCU bed 4.  Patient had a bolus of 1500cc IVF

## 2017-03-23 NOTE — ED Notes (Signed)
Pt transported to endo for cardioversion.

## 2017-03-23 NOTE — Interval H&P Note (Signed)
History and Physical Interval Note:  03/23/2017 12:17 PM  Melinda [name]  has presented today for surgery, with the diagnosis of AFIB  The various methods of treatment have been discussed with the patient and family. After consideration of risks, benefits and other options for treatment, the patient has consented to  Procedure(s): CARDIOVERSION (N/A) as a surgical intervention .  The patient's history has been reviewed, patient examined, no change in status, stable for surgery.  I have reviewed the patient's chart and labs.  Questions were answered to the patient's satisfaction.     [name]

## 2017-03-23 NOTE — Anesthesia Preprocedure Evaluation (Signed)
Anesthesia Evaluation  Patient identified by MRN, date of birth, ID band Patient awake    Reviewed: Allergy & Precautions, NPO status , Patient's Chart, lab work & pertinent test results  Airway Mallampati: II  TM Distance: <3 FB Neck ROM: Full    Dental no notable dental hx.    Pulmonary shortness of breath,    breath sounds clear to auscultation       Cardiovascular hypertension, + CAD and +CHF  + dysrhythmias  Rhythm:Irregular Rate:Normal     Neuro/Psych    GI/Hepatic Neg liver ROS, GERD  ,  Endo/Other  diabetesMorbid obesity  Renal/GU negative Renal ROS     Musculoskeletal   Abdominal   Peds  Hematology   Anesthesia Other Findings   Reproductive/Obstetrics                             Anesthesia Physical Anesthesia Plan  ASA: III  Anesthesia Plan: General   Post-op Pain Management:    Induction: Intravenous  PONV Risk Score and Plan: 3 and Treatment may vary due to age or medical condition, Dexamethasone and Ondansetron  Airway Management Planned: Mask  Additional Equipment:   Intra-op Plan:   Post-operative Plan: Extubation in OR  Informed Consent:   Dental advisory given  Plan Discussed with: CRNA  Anesthesia Plan Comments:         Anesthesia Quick Evaluation

## 2017-03-23 NOTE — Progress Notes (Signed)
    84 year old paroxysmal atrial fibrillation, ejection fraction 45% on amiodarone, chronic intake regulation here for cardioversion because of significant shortness of breath recently.  Cardioversion consisted of 150 J x1, 60 mg of propofol, typical dosing.  Post shock per report she was significantly bradycardic.  Multiple adrenergic medications were utilized.  Atropine was utilized without much effect.  IV fluids were administered.  Dopamine as well as norepinephrine were utilized at 10.  An A-line was placed by anesthesia.  Bedside echocardiogram, portable was utilized personally and demonstrated ejection fraction likely in the 50% range.  Right ventricle mildly dilated.  She was transported to the to heart unit for further care.  I spoke to her to family members, sisters.  They stated that after her last cardioversion her heart rate was relatively slow in the 50s and she went home.  They stated that her mother had trouble with anesthesia, coming out of it in the past.  Currently her blood pressure is in the 66Y systolic, heart rate is 61 sinus rhythm, satting 94% on room air, no crackles, she is sleepy but does answer questions when aroused.  She moves all extremities, nonfocal.  She does state that she was having trouble focusing on me when she was talking.  Critical care time 40 minutes spent with patient, nursing staff, collaboration with Dr. Radford Pax, Dr. Burt Knack, data review.  We will continue to monitor her closely.  We will keep her family updated.  Candee Furbish, MD

## 2017-03-23 NOTE — Transfer of Care (Signed)
Immediate Anesthesia Transfer of Care Note  Patient: Melinda [name]  Procedure(s) Performed: CARDIOVERSION (N/A )  Patient Location: ICU  Anesthesia Type:General  Level of Consciousness: responds to stimulation  Airway & Oxygen Therapy: Patient Spontanous Breathing and Patient connected to nasal cannula oxygen  Post-op Assessment: Report given to RN, Post -op Vital signs reviewed and unstable, Anesthesiologist notified and Patient moving all extremities  Post vital signs: Reviewed and unstable  Last Vitals:  Vitals:   03/23/17 1300 03/23/17 1335  BP:  132/79  Pulse: (!) 104 (!) 103  Resp: (!) 25 (!) 24  Temp:  36.8 C  SpO2: 100% 97%    Last Pain:  Vitals:   03/23/17 1335  TempSrc: Oral  PainSc:          Complications: No apparent anesthesia complications

## 2017-03-23 NOTE — Anesthesia Procedure Notes (Signed)
Arterial Line Insertion Start/End1/16/2019 2:35 PM, 03/23/2017 2:45 PM Performed by: Rica Koyanagi, MD, Myna Bright, CRNA, CRNA  Preanesthetic checklist: patient identified, IV checked, monitors and equipment checked and pre-op evaluation Lidocaine 1% used for infiltration radial was placed Catheter size: 20 G Hand hygiene performed  and maximum sterile barriers used   Attempts: 3 Procedure performed without using ultrasound guided technique. Ultrasound Notes:anatomy identified

## 2017-03-23 NOTE — Anesthesia Procedure Notes (Signed)
Arterial Line Insertion Start/End1/16/2019 2:30 PM, 03/23/2017 2:45 PM Performed by: Myna Bright, CRNA, CRNA  Patient location: OOR procedure area. Preanesthetic checklist: patient identified, IV checked, monitors and equipment checked and pre-op evaluation Emergency situation Lidocaine 1% used for infiltration radial was placed Catheter size: 20 G Hand hygiene performed  and maximum sterile barriers used  Allen's test indicative of satisfactory collateral circulation Attempts: 2 Procedure performed without using ultrasound guided technique. Following insertion, dressing applied and Biopatch. Post procedure assessment: normal  Patient tolerated the procedure well with no immediate complications.

## 2017-03-23 NOTE — Interval H&P Note (Signed)
History and Physical Interval Note:  03/23/2017 1:47 PM  Melinda [name]  has presented today for surgery, with the diagnosis of AFIB  The various methods of treatment have been discussed with the patient and family. After consideration of risks, benefits and other options for treatment, the patient has consented to  Procedure(s): CARDIOVERSION (N/A) as a surgical intervention .  The patient's history has been reviewed, patient examined, no change in status, stable for surgery.  I have reviewed the patient's chart and labs.  Questions were answered to the patient's satisfaction.     [name]

## 2017-03-23 NOTE — Progress Notes (Signed)
Patient became hypotensive post DCCV with HR 44bpm with SBP in 60's.  SHe was given Ephredrine 35mg  IV, Atropine 0.8mg  IV, neosynephring 474mcg and started on IV dopamine gtt.  SBP currently 44mmHg and starting IV Levophed.  Will transfer to CCU.

## 2017-03-23 NOTE — Interval H&P Note (Signed)
History and Physical Interval Note:  03/23/2017 8:33 AM  Melinda [name]  has presented today for surgery, with the diagnosis of AFIB  The various methods of treatment have been discussed with the patient and family. After consideration of risks, benefits and other options for treatment, the patient has consented to  Procedure(s): CARDIOVERSION (N/A) as a surgical intervention .  The patient's history has been reviewed, patient examined, no change in status, stable for surgery.  I have reviewed the patient's chart and labs.  Questions were answered to the patient's satisfaction.     [name]

## 2017-03-24 LAB — GLUCOSE, CAPILLARY
Glucose-Capillary: 122 mg/dL — ABNORMAL HIGH (ref 65–99)
Glucose-Capillary: 116 mg/dL — ABNORMAL HIGH (ref 65–99)

## 2017-03-24 MED ORDER — ORAL CARE MOUTH RINSE
15.0000 mL | Freq: Two times a day (BID) | OROMUCOSAL | Status: DC
  Administered 2017-03-24: 15 mL via OROMUCOSAL

## 2017-03-24 NOTE — Progress Notes (Signed)
Tele monitor set up and activated for safety.

## 2017-03-24 NOTE — Discharge Summary (Signed)
Discharge Summary    Patient ID: Melinda [name],  MRN: [id number], DOB/AGE: February 03, 1932 84 y.o.  Admit date: 03/22/2017 Discharge date: 03/24/2017  Primary Care Provider: Crist Infante Primary Cardiologist: Peter Martinique, MD  Discharge Diagnoses    Principal Problem:   Chest pain with moderate risk for cardiac etiology Active Problems:   Multifactorial gait disorder   OSA on CPAP   Essential hypertension   Chest pain, moderate coronary artery risk   PAF (paroxysmal atrial fibrillation) (HCC)   Diabetes ([place])   Atypical atrial flutter (HCC)   Atrial flutter (HCC)   Shock (HCC)   Allergies Allergies  Allergen Reactions  . Lasix [Furosemide] Itching  . Phenobarbital Itching  . Adhesive [Tape] Other (See Comments)    SKIN WILL TEAR EASILY; PLEASE USE PAPER TAPE OR COBAN WRAP!!  . Amitiza [Lubiprostone] Itching    Diagnostic Studies/Procedures    DCCV 03/23/17: Cardioversion consisted of 150 J x1, 60 mg of propofol, typical dosing. Return to NSR with bradycardia and hypotension (see below)    Echo 02/24/17: Study Conclusions - Left ventricle: The cavity size was normal. Systolic function was   mildly reduced. The estimated ejection fraction was in the range   of 45% to 50%. Diffuse hypokinesis worse in the septal   myocardium. - Aortic valve: There was no regurgitation. - Mitral valve: Mildly calcified annulus. There was mild   regurgitation. - Left atrium: The atrium was moderately dilated. - Right ventricle: The cavity size was normal. Wall thickness was   normal. Systolic function was normal. - Right atrium: The atrium was severely dilated. - Atrial septum: No defect or patent foramen ovale was identified. - Tricuspid valve: There was mild regurgitation. - Pulmonary arteries: Systolic pressure was within the normal   range. PA peak pressure: 32 mm Hg (S). - Pericardium, extracardiac: A small pericardial effusion was   identified. There was evidence for  increased RV-LV interaction   demonstrated by respirophasic changes in transmitral velocities   and tricuspid velocities.  Impressions: - Variation was noted in the mitral and tricuspid valve inflow.   This is likely due to atrial fibrillation rather than tamponade.   IVC collapses normally.   History of Present Illness     Melinda [name] was on her way to have a mammogram this pm on 03/22/17, and had onset of SSCP, pressure, at about 12:15, while driving. It was 4/10 and radiated up to her L ear and jaw. She had SOB w/ this and a HA. When she got to radiology, EMS was called. She got ASA but no other meds. She was transported to the [facility] ER, pain resolved by arrival. Her SOB and headache have also resolved.   The CP was not changed by position change or deep inspiration.  Last pm, she had headache and nausea. Sx resolved by this am. She did not have N&V or diaphoresis with the chest pain.  The pain is similar to previous CP episodes. She gets these about once a month. They generally occur at bedtime.  She was admitted 12/19-12/21/2018 for chest pain. Those sx improved w/ ASA 81 mg. She was in atrial flutter during that admission. Her sotalol was stopped and metoprolol was increased. She was started on amiodarone.   No palpitations, dizzyness or presyncope. She is not aware of the atrial flutter in general, but can occasionally feel her heart beat.  No LE edema, no orthopnea or PND. She has chronic DOE, no recent change. Her activity  is mainly limited by MS issues. She uses a walker in the morning, does not generally use it in the afternoon. She falls 1-2 x month, the last time was during the big snow recently. Has not gotten any significant injuries, if she hits her head, she gets evaluated. She feels her balance is bad, she states she is unsteady when she walks.  [name] can climb a flight of stairs without stopping, but uses both feet on each one. No bleeding issues.   Hospital Course       Consultants: none  Pt was admitted to cardiology with plans for cardioversion on 03/23/17.  Cardioversion consisted of 150 J x1, 60 mg of propofol, typical dosing.  Post shock per report she was significantly bradycardic.  Multiple adrenergic medications were utilized.  Atropine was utilized without much effect.  IV fluids were administered.  Dopamine as well as norepinephrine were utilized at 10.  An A-line was placed by anesthesia.  Bedside echocardiogram, portable was utilized personally and demonstrated ejection fraction likely in the 50% range.  Right ventricle mildly dilated.  She was transported to the to heart unit for further care.  I spoke to her to family members, sisters.  They stated that after her last cardioversion her heart rate was relatively slow in the 50s and she went home.  They stated that her mother had trouble with anesthesia, coming out of it in the past.  Currently her blood pressure is in the 40N systolic, heart rate is 61 sinus rhythm, satting 94% on room air, no crackles, she is sleepy but does answer questions when aroused.  She moves all extremities, nonfocal.  She does state that she was having trouble focusing on me when she was talking.  On 03/24/17, she was feeling better with more clarity. She likely had a sundowning event overnight with a period of delirium that may have been residual from her hypotension post cardioversion. She denied chest pain and shortness of breath.   Vasodilatory shock post DCCV; paroxysmal atrial flutter, atypical This has resolved. Continue amiodarone 200 mg BID. Hold anti-hypertensives. Holding beta blocker given her bradycardia. Continue eliquis.   Care should be taken if she has a future cardioversion.   Chronic diastolic heart failure Try to maintain sinus rhythm. Pt is euvolemic on exam.  Delirium Pt answers orientation questions correctly today; however, seems to ramble a bit. I called and spoke with both her sister and her  daughter. I suggested that the patient should not be alone at night by herself for the next few days. I also asked her daughter to evaluate if the pt is at her baseline in terms of conversation. Daughter at bedside states her mom is at her neurologic baseline. Family has arranged for 24/7 supervision for the next week.   Patient has been seen and examined by Dr. Marlou Porch today and is deemed stable for discharge. All follow up appointments have been scheduled and discharge medications are listed below.  _____________  Discharge Vitals Blood pressure 137/71, pulse 60, temperature 98.3 F (36.8 C), temperature source Oral, resp. rate (!) 22, height 5' (1.524 m), weight 172 lb 2.9 oz (78.1 kg), SpO2 93 %.  Filed Weights   03/22/17 1331 03/23/17 1515 03/24/17 0500  Weight: 160 lb 3.2 oz (72.7 kg) 170 lb 3.1 oz (77.2 kg) 172 lb 2.9 oz (78.1 kg)    Labs & Radiologic Studies    CBC Recent Labs    03/22/17 1355 03/23/17 0334  WBC 4.4 4.9  HGB 13.4 12.5  HCT 40.6 38.6  MCV 97.6 98.0  PLT 195 353   Basic Metabolic Panel Recent Labs    03/22/17 1355 03/23/17 0334  NA 138 140  K 3.3* 4.4  CL 98* 103  CO2 30 27  GLUCOSE 121* 133*  BUN 20 19  CREATININE 1.15* 1.18*  CALCIUM 9.2 8.9   Liver Function Tests Recent Labs    03/23/17 0334  AST 20  ALT 19  ALKPHOS 57  BILITOT 1.3*  PROT 6.1*  ALBUMIN 3.3*   No results for input(s): LIPASE, AMYLASE in the last 72 hours. Cardiac Enzymes Recent Labs    03/23/17 0334 03/23/17 0920 03/23/17 1503  TROPONINI <0.03 <0.03 <0.03   BNP Invalid input(s): POCBNP D-Dimer No results for input(s): DDIMER in the last 72 hours. Hemoglobin A1C No results for input(s): HGBA1C in the last 72 hours. Fasting Lipid Panel No results for input(s): CHOL, HDL, LDLCALC, TRIG, CHOLHDL, LDLDIRECT in the last 72 hours. Thyroid Function Tests Recent Labs    03/23/17 0333  TSH 3.334   _____________  Dg Chest 2 View  Result Date:  03/22/2017 CLINICAL DATA:  Chest pain EXAM: CHEST  2 VIEW COMPARISON:  02/23/2017.  03/11/2016. FINDINGS: Stable asymmetric elevation right hemidiaphragm. The lungs are clear without focal pneumonia, edema, pneumothorax or pleural effusion. Cardiopericardial silhouette is at upper limits of normal for size. Right hilar fullness similar to prior studies compatible with vascular anatomy. The visualized bony structures of the thorax are intact. IMPRESSION: Stable.  No acute findings. Electronically Signed   By: Misty Stanley M.D.   On: 03/22/2017 14:40   Dg Chest 2 View  Result Date: 02/23/2017 CLINICAL DATA:  Chest pain EXAM: CHEST  2 VIEW COMPARISON:  Chest radiograph 09/04/2016 FINDINGS: Mild cardiomegaly. Unchanged elevation of the right hemidiaphragm. No focal airspace consolidation or pulmonary edema. No pleural effusion or pneumothorax. IMPRESSION: No active cardiopulmonary disease. Electronically Signed   By: Ulyses Jarred M.D.   On: 02/23/2017 01:23   Disposition   Pt is being discharged home today in good condition.  Follow-up Plans & Appointments    Follow-up Information    Lendon Colonel, NP Follow up on 04/05/2017.   Specialties:  Nurse Practitioner, Radiology, Cardiology Why:  10:30 AM for hospital follow up Contact information: [address] [place] [address] [phone]          Discharge Instructions    Diet - low sodium heart healthy   Complete by:  As directed    Diet - low sodium heart healthy   Complete by:  As directed    Increase activity slowly   Complete by:  As directed    Increase activity slowly   Complete by:  As directed       Discharge Medications   Allergies as of 03/24/2017      Reactions   Lasix [furosemide] Itching   Phenobarbital Itching   Adhesive [tape] Other (See Comments)   SKIN WILL TEAR EASILY; PLEASE USE PAPER TAPE OR COBAN WRAP!!   Amitiza [lubiprostone] Itching      Medication List    STOP taking these  medications   metoprolol succinate 25 MG 24 hr tablet Commonly known as:  TOPROL XL     TAKE these medications   acetaminophen 650 MG CR tablet Commonly known as:  TYLENOL Take 650-1,300 mg by mouth 2 (two) times daily as needed (for arthritis pain).   amiodarone 200 MG tablet Commonly known as:  PACERONE Take 1 tablet (200 mg total) by mouth 2 (two) times daily.   apixaban 5 MG Tabs tablet Commonly known as:  ELIQUIS Take 1 tablet (5 mg total) by mouth 2 (two) times daily.   atorvastatin 40 MG tablet Commonly known as:  LIPITOR Take 40 mg by mouth daily.   CALCIUM 600+D3 600-400 MG-UNIT Tabs Generic drug:  Calcium Carbonate-Vitamin D3 Take 1 tablet by mouth daily.   cholecalciferol 1000 units tablet Commonly known as:  VITAMIN D Take 1,000 Units by mouth daily.   desipramine 25 MG tablet Commonly known as:  NORPRAMIN Take 25 mg by mouth daily.   famotidine 20 MG tablet Commonly known as:  PEPCID Take 20 mg by mouth daily.   glucosamine-chondroitin 500-400 MG tablet Take 1 tablet by mouth every other day.   KLOR-CON 10 10 MEQ tablet Generic drug:  potassium chloride TAKE 2 TABLETS (20 MEQ TOTAL) BY MOUTH DAILY.   linaclotide 145 MCG Caps capsule Commonly known as:  LINZESS Take 145 mcg by mouth daily before breakfast.   LORazepam 0.5 MG tablet Commonly known as:  ATIVAN Take 0.5 mg by mouth at bedtime.   OCUVITE PRESERVISION PO Take 1 tablet by mouth daily.   polyethylene glycol packet Commonly known as:  MIRALAX / GLYCOLAX Take 17 g by mouth at bedtime. Mix in 8 oz liquid and drink   PRESCRIPTION MEDICATION CPAP with oxygen at bedtime   SYSTANE 0.4-0.3 % Gel ophthalmic gel Generic drug:  Polyethyl Glycol-Propyl Glycol Place 1 application into both eyes at bedtime.   torsemide 10 MG tablet Commonly known as:  DEMADEX Take 1 tablet (10 mg total) by mouth daily.   vitamin B-12 1000 MCG tablet Commonly known as:  CYANOCOBALAMIN Take 1,000 mcg by  mouth daily.        Outstanding Labs/Studies   ?restart low does toprol?  Duration of Discharge Encounter   Greater than 30 minutes including physician time.  Signed, Tami Lin Duke NP 03/24/2017, 3:11 PM   Personally seen and examined. Agree with above.   Vasodilatory shock post cardioversion  -This has resolved.  She is now off of norepinephrine and dopamine.  Her pressures are stable.  -Continue to hold beta-blocker, metoprolol 37.5 twice a day.  -Continue with amiodarone 200 mg twice a day.  -No other blood pressure medications.  -If she is doing well, I am comfortable with her being discharged later on today after lunch.  We want to make sure she is stable however.  Paroxysmal atrial flutter, atypical  -Continue with amiodarone  -We will need to be very careful if we perform another cardioversion on her as she had quite an adverse reaction with prolonged hypotension transient bradycardia yesterday.  -Postconversion bradycardia, heart rate in the 40s, currently in the 60s.  Continue with amiodarone only.  Chest pain  -Resolved.  Chronic anti-coagulation  -Continue with Eliquis  Delirium  -Last night became confused and agitated.  Likely sundowning.  Mittens had to be placed to protect her IV lines.  -She seems clear this morning.  -Discontinue restraints.  Chronic diastolic heart failure  -Feels better.  No significant crackles on exam.  Try to maintain sinus rhythm.  Daughter states that this is her baseline and feels comfortable with taking care of her. Understands that she needs 24 hour assistance. OK for DC.   Candee Furbish, MD

## 2017-03-24 NOTE — Progress Notes (Signed)
Progress Note  Patient Name: Melinda [name] Date of Encounter: 03/24/2017  Primary Cardiologist: Peter Martinique, MD   Subjective   Feeling better, clearer.  Had a period of delirium, likely sundowning overnight or perhaps residual from hypotension post cardioversion.  No chest pain, no shortness of breath  Inpatient Medications    Scheduled Meds: . amiodarone  200 mg Oral BID  . apixaban  5 mg Oral BID  . atorvastatin  40 mg Oral Daily  . calcium-vitamin D  1 tablet Oral Q breakfast  . cholecalciferol  1,000 Units Oral Daily  . desipramine  25 mg Oral Daily  . famotidine  20 mg Oral Daily  . insulin aspart  0-15 Units Subcutaneous TID WC  . linaclotide  145 mcg Oral QAC breakfast  . LORazepam  0.5 mg Oral QHS  . mouth rinse  15 mL Mouth Rinse BID  . polyethylene glycol  17 g Oral QHS  . polyvinyl alcohol  1 drop Both Eyes QHS  . potassium chloride  20 mEq Oral Daily  . vitamin B-12  1,000 mcg Oral Daily   Continuous Infusions: . DOPamine Stopped (03/23/17 2200)   PRN Meds: acetaminophen, ALPRAZolam, nitroGLYCERIN, ondansetron (ZOFRAN) IV, zolpidem   Vital Signs    Vitals:   03/24/17 0410 03/24/17 0500 03/24/17 0600 03/24/17 0800  BP: (!) 115/103 120/66 99/86 117/71  Pulse:      Resp: (!) 27 (!) 22 (!) 24 19  Temp:      TempSrc:      SpO2: 95% 98% 99% 96%  Weight:  172 lb 2.9 oz (78.1 kg)    Height:        Intake/Output Summary (Last 24 hours) at 03/24/2017 0917 Last data filed at 03/23/2017 1600 Gross per 24 hour  Intake 1721.53 ml  Output -  Net 1721.53 ml   Filed Weights   03/22/17 1331 03/23/17 1515 03/24/17 0500  Weight: 160 lb 3.2 oz (72.7 kg) 170 lb 3.1 oz (77.2 kg) 172 lb 2.9 oz (78.1 kg)    Telemetry    Sinus rhythm, no pauses- Personally Reviewed  ECG    Sinus rhythm- Personally Reviewed  Physical Exam   GEN: No acute distress.   Neck: No JVD Cardiac: RRR, no murmurs, rubs, or gallops.  Respiratory: Clear to auscultation  bilaterally. GI: Soft, nontender, non-distended  MS: No edema; No deformity.  Soft mittens in place Neuro:  Nonfocal  Psych: Normal affect appears more clear this morning  Labs    Chemistry Recent Labs  Lab 03/21/17 1504 03/22/17 1355 03/23/17 0334  NA 144 138 140  K 3.9 3.3* 4.4  CL 99 98* 103  CO2 30* 30 27  GLUCOSE 112* 121* 133*  BUN 22 20 19   CREATININE 1.06* 1.15* 1.18*  CALCIUM 9.4 9.2 8.9  PROT  --   --  6.1*  ALBUMIN  --   --  3.3*  AST  --   --  20  ALT  --   --  19  ALKPHOS  --   --  57  BILITOT  --   --  1.3*  GFRNONAA 49* 43* 41*  GFRAA 56* 50* 48*  ANIONGAP  --  10 10     Hematology Recent Labs  Lab 03/21/17 1504 03/22/17 1355 03/23/17 0334  WBC 4.2 4.4 4.9  RBC 4.14 4.16 3.94  HGB 13.1 13.4 12.5  HCT 38.4 40.6 38.6  MCV 93 97.6 98.0  MCH 31.6 32.2 31.7  MCHC  34.1 33.0 32.4  RDW 14.5 13.8 14.1  PLT 219 195 187    Cardiac Enzymes Recent Labs  Lab 03/22/17 2235 03/23/17 0334 03/23/17 0920 03/23/17 1503  TROPONINI <0.03 <0.03 <0.03 <0.03    Recent Labs  Lab 03/22/17 1408 03/22/17 1643  TROPIPOC 0.01 0.01     BNPNo results for input(s): BNP, PROBNP in the last 168 hours.   DDimer No results for input(s): DDIMER in the last 168 hours.   Radiology    Dg Chest 2 View  Result Date: 03/22/2017 CLINICAL DATA:  Chest pain EXAM: CHEST  2 VIEW COMPARISON:  02/23/2017.  03/11/2016. FINDINGS: Stable asymmetric elevation right hemidiaphragm. The lungs are clear without focal pneumonia, edema, pneumothorax or pleural effusion. Cardiopericardial silhouette is at upper limits of normal for size. Right hilar fullness similar to prior studies compatible with vascular anatomy. The visualized bony structures of the thorax are intact. IMPRESSION: Stable.  No acute findings. Electronically Signed   By: Misty Stanley M.D.   On: 03/22/2017 14:40    Cardiac Studies   Cardiac catheterization in May 2017-mild coronary atherosclerosis.  Minimally elevated  LVEDP. Echocardiogram 02/24/17-EF 45-50%.  Pulmonary pressures 32 mmHg.  Patient Profile     84 y.o. female here for cardioversion with subsequent adverse reaction, transient bradycardia, prolonged hypotension requiring multiple pressor agents post.  Slow resolution.  Assessment & Plan    Vasodilatory shock post cardioversion  -This has resolved.  She is now off of norepinephrine and dopamine.  Her pressures are stable.  -Continue to hold beta-blocker, metoprolol 37.5 twice a day.  -Continue with amiodarone 200 mg twice a day.  -No other blood pressure medications.  -If she is doing well, I am comfortable with her being discharged later on today after lunch.  We want to make sure she is stable however.  Paroxysmal atrial flutter, atypical  -Continue with amiodarone  -We will need to be very careful if we perform another cardioversion on her as she had quite an adverse reaction with prolonged hypotension transient bradycardia yesterday.  -Postconversion bradycardia, heart rate in the 40s, currently in the 60s.  Continue with amiodarone only.  Chest pain  -Resolved.  Chronic anti-coagulation  -Continue with Eliquis  Delirium  -Last night became confused and agitated.  Likely sundowning.  Mittens had to be placed to protect her IV lines.  -She seems clear this morning.  -Discontinue restraints.  Chronic diastolic heart failure  -Feels better.  No significant crackles on exam.  Try to maintain sinus rhythm.  Hopeful discharge later this afternoon.   For questions or updates, please contact [facility] Please consult [email or web address] for contact info under Cardiology/STEMI.      Signed, Candee Furbish, MD  03/24/2017, 9:17 AM

## 2017-03-24 NOTE — Progress Notes (Signed)
Discharge instructions given to patient and patients daughter.  Both verbalize understanding and have no questions.  Patient discharged via wheelchair to daughters home.

## 2017-03-24 NOTE — Plan of Care (Signed)
Pt attempted to get out of the bed numerous times during the shift and also removed a PIV and A-Line. Patient was redirected during the shift numerous times. Pt was told by numerous staff members to not get out of bed and to to utilize the call bell if she needed assistance. Pt did not appear to understand this information and needs further reinforcement. Pt. Cognition changed during the shift. Please see flowsheet for more information.

## 2017-03-24 NOTE — Procedures (Signed)
Arterial Catheter Insertion Procedure Note Melinda [name] [id number] 03-21-1931  Procedure: Insertion of Arterial Catheter  Indications: Blood pressure monitoring  Procedure Details Consent: Risks of procedure as well as the alternatives and risks of each were explained to the (patient/caregiver).  Consent for procedure obtained. Time Out: Verified patient identification, verified procedure, site/side was marked, verified correct patient position, special equipment/implants available, medications/allergies/relevent history reviewed, required imaging and test results available.  Performed  Maximum sterile technique was used including antiseptics, cap, gloves, gown, hand hygiene, mask and sheet. Skin prep: Chlorhexidine; local anesthetic administered 20 gauge catheter was inserted into right radial artery using the Seldinger technique.  Evaluation Blood flow good; BP tracing good. Complications: No apparent complications.  Previous arterial line came out, RN requested RT to place new line. No complications with procedure.   Melinda [name] 03/24/2017

## 2017-03-28 ENCOUNTER — Ambulatory Visit (HOSPITAL_COMMUNITY): Admit: 2017-03-28 | Payer: Medicare HMO | Admitting: Internal Medicine

## 2017-03-28 ENCOUNTER — Encounter (HOSPITAL_COMMUNITY): Payer: Self-pay

## 2017-03-28 SURGERY — CARDIOVERSION (CATH LAB)
Anesthesia: LOCAL

## 2017-03-29 ENCOUNTER — Encounter: Payer: Self-pay | Admitting: Adult Health

## 2017-03-30 DIAGNOSIS — H353211 Exudative age-related macular degeneration, right eye, with active choroidal neovascularization: Secondary | ICD-10-CM | POA: Diagnosis not present

## 2017-03-30 DIAGNOSIS — H43813 Vitreous degeneration, bilateral: Secondary | ICD-10-CM | POA: Diagnosis not present

## 2017-03-30 DIAGNOSIS — H353121 Nonexudative age-related macular degeneration, left eye, early dry stage: Secondary | ICD-10-CM | POA: Diagnosis not present

## 2017-04-03 DIAGNOSIS — G4733 Obstructive sleep apnea (adult) (pediatric): Secondary | ICD-10-CM | POA: Diagnosis not present

## 2017-04-03 DIAGNOSIS — I5043 Acute on chronic combined systolic (congestive) and diastolic (congestive) heart failure: Secondary | ICD-10-CM | POA: Diagnosis not present

## 2017-04-03 DIAGNOSIS — R0902 Hypoxemia: Secondary | ICD-10-CM | POA: Diagnosis not present

## 2017-04-05 ENCOUNTER — Encounter: Payer: Self-pay | Admitting: Adult Health

## 2017-04-05 ENCOUNTER — Ambulatory Visit: Payer: Medicare HMO | Admitting: Adult Health

## 2017-04-05 VITALS — BP 100/62 | HR 113 | Ht 60.0 in | Wt 163.6 lb

## 2017-04-05 DIAGNOSIS — I4891 Unspecified atrial fibrillation: Secondary | ICD-10-CM | POA: Diagnosis not present

## 2017-04-05 DIAGNOSIS — Z79899 Other long term (current) drug therapy: Secondary | ICD-10-CM | POA: Diagnosis not present

## 2017-04-05 DIAGNOSIS — I952 Hypotension due to drugs: Secondary | ICD-10-CM

## 2017-04-05 DIAGNOSIS — R69 Illness, unspecified: Secondary | ICD-10-CM | POA: Diagnosis not present

## 2017-04-05 LAB — CBC
Hematocrit: 39.5 % (ref 34.0–46.6)
Hemoglobin: 13.3 g/dL (ref 11.1–15.9)
MCH: 32.8 pg (ref 26.6–33.0)
MCHC: 33.7 g/dL (ref 31.5–35.7)
MCV: 98 fL — AB (ref 79–97)
PLATELETS: 307 10*3/uL (ref 150–379)
RBC: 4.05 x10E6/uL (ref 3.77–5.28)
RDW: 15.6 % — AB (ref 12.3–15.4)
WBC: 7.9 10*3/uL (ref 3.4–10.8)

## 2017-04-05 LAB — BASIC METABOLIC PANEL
BUN / CREAT RATIO: 18 (ref 12–28)
BUN: 30 mg/dL — ABNORMAL HIGH (ref 8–27)
CHLORIDE: 97 mmol/L (ref 96–106)
CO2: 20 mmol/L (ref 20–29)
Calcium: 9.9 mg/dL (ref 8.7–10.3)
Creatinine, Ser: 1.63 mg/dL — ABNORMAL HIGH (ref 0.57–1.00)
GFR calc non Af Amer: 29 mL/min/{1.73_m2} — ABNORMAL LOW (ref >59)
GFR, EST AFRICAN AMERICAN: 33 mL/min/{1.73_m2} — AB (ref >59)
Glucose: 112 mg/dL — ABNORMAL HIGH (ref 65–99)
POTASSIUM: 4.7 mmol/L (ref 3.5–5.2)
Sodium: 138 mmol/L (ref 134–144)

## 2017-04-05 MED ORDER — METOPROLOL TARTRATE 25 MG PO TABS: 12.5000 mg | ORAL_TABLET | Freq: Two times a day (BID) | ORAL | 3 refills | Status: DC

## 2017-04-05 NOTE — Patient Instructions (Signed)
Medication Instructions:  START metoprolol tartrate (Lopressor) 12.5 mg two times daily  Labwork: TODAY (CBC, BMET)  Follow-Up: Keep follow up with Dr. Martinique 2/13 as scheduled  Any Other Special Instructions Will Be Listed Below (If Applicable).  Continue to monitor BP and HR at home-call with any questions or concerns.    If you need a refill on your cardiac medications before your next appointment, please call your pharmacy.

## 2017-04-05 NOTE — Progress Notes (Signed)
Cardiology Office Note   Date:  04/05/2017   ID:  Melinda [name], DOB Jul 15, 1931, MRN [id number]  PCP:  Crist Infante, MD  Cardiologist:  Dr. Peter Martinique  Chief Complaint  Patient presents with  . Chest Pain  . Atrial Fibrillation  . Hospitalization Follow-up     History of Present Illness: Melinda [name] is a 84 y.o. female who presents for posthospitalization follow-up, ongoing assessment and management of paroxysmal atrial fibrillation, hypertension, with other history to include diabetes, and multifactorial gait disorder, along with OSA on CPAP.  He was admitted after experiencing subscapular chest pain while driving her car on her way to a mammogram.  She was found to be in atrial flutter, she was started on amiodarone, and metoprolol.  Patient underwent cardioversion x1 on 03/23/2017.Melinda [name]    Echocardiogram was also completed which revealed an LVEF of 50%.  She had right ventricular dilatation.  The patient was continued on amiodarone 200 mg twice daily, metoprolol was discontinued post cardioversion in the setting of bradycardia.  She was to continue Eliquis.  She is here today with recent complaints of chest discomfort associated with elevated heart rate.  The patient also has a great deal of anxiety which normally occurs in the early evening hours.  Her daughter has noticed that she becomes mildly confused at that time.  She has a concern for sundowners.  She is going to approach her primary care physician about this.  She denies bleeding, palpitations, or chest pain now.  She has been keeping up with her blood pressure and has noticed that it has been stable, but her heart rate is increasingly elevated with an average heart rate between 98-112 bpm.  Brings with her a list of her blood pressures and the heart rate.  She has been medically compliant with amiodarone.  Past Medical History:  Diagnosis Date  . A-fib ([place])   . Acid reflux   . Anxiety   . Arthritis   . Cataract     surgery,bilateral  . Constipation   . Depression   . Diabetes mellitus without complication (HCC)    Pre diabetes  . Dyspnea on exertion    a. 07/2015 Echo: EF 55-60%, no rwma, mild MR, mod dil LA/RA/RV, mod TR, triv PR, PASP 62mHg.  . Falls   . Family history of adverse reaction to anesthesia    mother always had n/v  . Hx: UTI (urinary tract infection)   . Hypercholesterolemia   . Hypertensive heart disease   . Insomnia   . Multifactorial gait disorder   . Non-obstructive CAD    a. 07/2015 Cath: LM 30-40d, LAD min irregs, LCX min irregs, RCA 30p, RA 17, RV 33/7, PA 42/19, PCWP 17.  . On home oxygen therapy    "2L w/CPAP at night" (02/24/2017)  . Osteoarthritis    a. 06/2015 s/p R total hip arthroplasty.  .Melinda KitchenPAF (paroxysmal atrial fibrillation) ([place]    a. Dx 07/2015, CHA2DS2VASc = 6-->Eliquis.  . Premature atrial contractions   . Pulmonary hypertension ([place]    a. 07/2015 Echo: PASP 514mg.  . Melinda Kitchenyloric antral stenosis    in childhood,infancy  . Sleep apnea with use of continuous positive airway pressure (CPAP) 10/19/2012   a. compliant w/ CPAP.    Past Surgical History:  Procedure Laterality Date  . ABDOMINAL HYSTERECTOMY  1970  . ABDOMINAL SURGERY     as a 11 30ay old baby  . CARDIAC CATHETERIZATION N/A 07/14/2015   Procedure: Right/Left Heart  Cath and Coronary Angiography;  Surgeon: Sanda Klein, MD;  Location: [facility];  Service: Cardiovascular;  Laterality: N/A;  . CARDIOVERSION N/A 11/04/2015   Procedure: CARDIOVERSION;  Surgeon: Lelon Perla, MD;  Location: [facility] ENDOSCOPY;  Service: Cardiovascular;  Laterality: N/A;  . CARDIOVERSION N/A 02/25/2016   Procedure: CARDIOVERSION;  Surgeon: Pixie Casino, MD;  Location: [facility] ENDOSCOPY;  Service: Cardiovascular;  Laterality: N/A;  . CARDIOVERSION N/A 12/13/2016   Procedure: CARDIOVERSION;  Surgeon: Fay Records, MD;  Location: [facility] ENDOSCOPY;  Service: Cardiovascular;  Laterality: N/A;  . CARDIOVERSION N/A 03/23/2017     Procedure: CARDIOVERSION;  Surgeon: Sueanne Margarita, MD;  Location: [facility] ENDOSCOPY;  Service: Cardiovascular;  Laterality: N/A;  . COLONOSCOPY    . EYE SURGERY Bilateral    cataract surgery with lens implant  . INNER EAR SURGERY Left    x 3  . JOINT REPLACEMENT Right   . LAPAROSCOPIC SALPINGO OOPHERECTOMY    . NM MYOVIEW LTD  11/30/2007   normal stress nuclear study/EF-83%  . TONSILLECTOMY    . TOTAL HIP ARTHROPLASTY Right 06/26/2015   Procedure: RIGHT TOTAL HIP ARTHROPLASTY ANTERIOR APPROACH;  Surgeon: Leandrew Koyanagi, MD;  Location: [facility];  Service: Orthopedics;  Laterality: Right;  . TRANSTHORACIC ECHOCARDIOGRAM  07/23/2009   mild concentric LVH/mild mitral insufficiency/ moderate tricuspid innsufficiency/mild pulmonary HTN/EF- 55-60%  . VAGINAL DELIVERY       Current Outpatient Medications  Medication Sig Dispense Refill  . acetaminophen (TYLENOL) 650 MG CR tablet Take 650-1,300 mg by mouth 2 (two) times daily as needed (for arthritis pain).     Melinda [name] amiodarone (PACERONE) 200 MG tablet Take 1 tablet (200 mg total) by mouth 2 (two) times daily. 180 tablet 3  . apixaban (ELIQUIS) 5 MG TABS tablet Take 1 tablet (5 mg total) by mouth 2 (two) times daily. 60 tablet 0  . atorvastatin (LIPITOR) 40 MG tablet Take 40 mg by mouth daily.     . Calcium Carbonate-Vitamin D3 (CALCIUM 600+D3) 600-400 MG-UNIT TABS Take 1 tablet by mouth daily.    . cholecalciferol (VITAMIN D) 1000 UNITS tablet Take 1,000 Units by mouth daily.     Melinda [name] desipramine (NORPRAMIN) 25 MG tablet Take 25 mg by mouth daily.    . famotidine (PEPCID) 20 MG tablet Take 20 mg by mouth daily.    Melinda [name] glucosamine-chondroitin 500-400 MG tablet Take 1 tablet by mouth every other day.     . linaclotide (LINZESS) 145 MCG CAPS capsule Take 145 mcg by mouth daily before breakfast.     . LORazepam (ATIVAN) 1 MG tablet Take 1 tablet by mouth at bedtime.  5  . Multiple Vitamins-Minerals (OCUVITE PRESERVISION PO) Take 1 tablet by mouth daily.    [name] Glycol-Propyl Glycol (SYSTANE) 0.4-0.3 % GEL ophthalmic gel Place 1 application into both eyes at bedtime.     . polyethylene glycol (MIRALAX / GLYCOLAX) packet Take 17 g by mouth at bedtime. Mix in 8 oz liquid and drink    . PRESCRIPTION MEDICATION CPAP with oxygen at bedtime    . torsemide (DEMADEX) 10 MG tablet Take 1 tablet (10 mg total) by mouth daily. 30 tablet 0  . vitamin B-12 (CYANOCOBALAMIN) 1000 MCG tablet Take 1,000 mcg by mouth daily.    Melinda [name] KLOR-CON 10 10 MEQ tablet TAKE 2 TABLETS (20 MEQ TOTAL) BY MOUTH DAILY. 60 tablet 6  . metoprolol tartrate (LOPRESSOR) 25 MG tablet Take 0.5 tablets (12.5 mg total) by mouth 2 (two)  times daily. 90 tablet 3   No current facility-administered medications for this visit.     Allergies:   Lasix [furosemide]; Phenobarbital; Adhesive [tape]; and Amitiza [lubiprostone]    Social History:  The patient  reports that  has never smoked. she has never used smokeless tobacco. She reports that she does not drink alcohol or use drugs.   Family History:  The patient's family history includes Cancer in her mother; Diabetes in her sister; Heart attack in her father; Heart failure in her mother and sister; Hypertension in her sister.    ROS: All other systems are reviewed and negative. Unless otherwise mentioned in H&P    PHYSICAL EXAM: VS:  BP 100/62   Pulse (!) 113   Ht 5' (1.524 m)   Wt 163 lb 9.6 oz (74.2 kg)   BMI 31.95 kg/m  , BMI Body mass index is 31.95 kg/m. GEN: Well nourished, well developed, in no acute distress  HEENT: normal  Neck: no JVD, carotid bruits, or masses Cardiac: RRR; no murmurs, rubs, or gallops,no edema  Respiratory:  clear to auscultation bilaterally, normal work of breathing GI: soft, nontender, nondistended, + BS MS: no deformity or atrophy  Skin: warm and dry, no rash Neuro:  Strength and sensation are intact Psych: euthymic mood, full affect   EKG: Atrial tachycardia, heart rate 113 bpm, left axis  deviation is noted.  Recent Labs: 11/30/2016: Magnesium 2.1 03/23/2017: ALT 19; BUN 19; Creatinine, Ser 1.18; Hemoglobin 12.5; Platelets 187; Potassium 4.4; Sodium 140; TSH 3.334    Lipid Panel    Component Value Date/Time   CHOL 153 02/23/2017 0807   TRIG 137 02/23/2017 0807   HDL 62 02/23/2017 0807   CHOLHDL 2.5 02/23/2017 0807   VLDL 27 02/23/2017 0807   LDLCALC 64 02/23/2017 0807      Wt Readings from Last 3 Encounters:  04/05/17 163 lb 9.6 oz (74.2 kg)  03/24/17 172 lb 2.9 oz (78.1 kg)  03/21/17 166 lb (75.3 kg)      Other studies Reviewed: DCCV 03/23/17: Cardioversion consisted of 150 J x1, 60 mg of propofol, typical dosing. Return to NSR with bradycardia and hypotension (see below)    Echo 02/24/17: Study Conclusions - Left ventricle: The cavity size was normal. Systolic function was mildly reduced. The estimated ejection fraction was in the range of 45% to 50%. Diffuse hypokinesis worse in the septal myocardium. - Aortic valve: There was no regurgitation. - Mitral valve: Mildly calcified annulus. There was mild regurgitation. - Left atrium: The atrium was moderately dilated. - Right ventricle: The cavity size was normal. Wall thickness was normal. Systolic function was normal. - Right atrium: The atrium was severely dilated. - Atrial septum: No defect or patent foramen ovale was identified. - Tricuspid valve: There was mild regurgitation. - Pulmonary arteries: Systolic pressure was within the normal range. PA peak pressure: 32 mm Hg (S). - Pericardium, extracardiac: A small pericardial effusion was identified. There was evidence for increased RV-LV interaction demonstrated by respirophasic changes in transmitral velocities and tricuspid velocities.  Impressions: - Variation was noted in the mitral and tricuspid valve inflow. This is likely due to atrial fibrillation rather than tamponade. IVC collapses  normally.     ASSESSMENT AND PLAN:  1.  Atrial fibrillation: She is status post cardioversion.  She is being followed by Dr. Lovena Le, is now on amiodarone 200 mg twice daily.  Due to bradycardia metoprolol was discontinued.  She remains in sinus rhythm but is tachycardic.  This  is correlated by her home blood pressure and heart rate readings.  She was taken off of metoprolol tartrate, during hospitalization (she was on 25 mg in the morning and 12.5 mg in the evening).  Due to heart rate elevation, I am going to start her back on lower dose metoprolol 12.5 mg twice daily.  She is to keep up with her heart rate and blood pressure and report any significant bradycardia hypotension or abnormalities.  In the interim, I am going to check a CBC as she has been on Eliquis to evaluate for anemia causing elevated heart rates.  Also will check a be met as she is also on a diuretic to evaluate for dehydration and kidney function.  She will keep her follow-up appointment with Dr. Martinique, Wednesday, April 20, 2017.  He will consider decreasing amiodarone to daily at that time as she would have been on it for 1 month at 200 mg twice daily.  She will keep her appointment with Dr. Lovena Le where she is to be followed a week later.  Medication adjustments at their discretion.  2.  Hypotension: Blood pressure is soft today which may be related to the amiodarone.  Consider decreasing it to 200 mg daily now but will discuss with Dr. Martinique prior to doing so.  Awaiting labs to evaluate for anemia and dehydration.  3.  Mild dementia: The patient has been experiencing confusion since leaving the hospital.  It is getting better, but she continues to have some confusion in the early evening hours.  Her daughter is concerned about sundowners.  I have advised her to speak with the patient's primary care physician about this.  She is due to see PCP in a few months.  I have advised her to make the appointment sooner    Current  medicines are reviewed at length with the patient today.    Labs/ tests ordered today include: BMET CBC  Phill Myron. West Pugh, ANP, AACC   04/05/2017 12:24 PM    [facility] [address], [place], [place] [address] Phone: [phone]; Fax: [phone]

## 2017-04-06 ENCOUNTER — Telehealth: Payer: Self-pay | Admitting: Adult Health

## 2017-04-06 MED ORDER — TORSEMIDE 10 MG PO TABS: 10.0000 mg | ORAL_TABLET | ORAL | 0 refills | Status: DC

## 2017-04-06 NOTE — Telephone Encounter (Signed)
New message    Patient returning call to Melinda [name] and for lab results. Please call

## 2017-04-06 NOTE — Addendum Note (Signed)
Addended by: Diana Eves on: 04/06/2017 04:36 PM   Modules accepted: Orders

## 2017-04-06 NOTE — Telephone Encounter (Signed)
Follow up    Patient is calling to get lab results please call.

## 2017-04-06 NOTE — Telephone Encounter (Signed)
Patient made aware of lab results and verbalized her understanding. She will cut her Torsemide back to every other day. She has a follow up with Dr. Martinique on 2/13.  Notes recorded by Lendon Colonel, NP on 04/06/2017 at 8:10 AM EST Labs are reviewed. She has evidence of mild dehydration. She is taking torsemide for fluid retention. May need to cut back on that to every other day. Otherwise we will proceed with stress test as scheduled. Will discuss this more with her on follow up. For now, have her decrease her torsemide to every other day. [name]

## 2017-04-11 ENCOUNTER — Other Ambulatory Visit: Payer: Self-pay

## 2017-04-11 DIAGNOSIS — G4733 Obstructive sleep apnea (adult) (pediatric): Secondary | ICD-10-CM | POA: Diagnosis not present

## 2017-04-11 NOTE — Patient Outreach (Signed)
  Melinda [name]) Care Management  [place]   04/11/2017  Melinda [name] September 07, 1931 [id number]  Subjective: Patient was unable to be reached by the telephone. 30 day discharge medication reconciliation was completed electronically.  Objective:   Encounter Medications: Outpatient Encounter Medications as of 04/11/2017  Medication Sig  . acetaminophen (TYLENOL) 650 MG CR tablet Take 650-1,300 mg by mouth 2 (two) times daily as needed (for arthritis pain).   [name] amiodarone (PACERONE) 200 MG tablet Take 1 tablet (200 mg total) by mouth 2 (two) times daily.  [name] apixaban (ELIQUIS) 5 MG TABS tablet Take 1 tablet (5 mg total) by mouth 2 (two) times daily.  [name] atorvastatin (LIPITOR) 40 MG tablet Take 40 mg by mouth daily.   . Calcium Carbonate-Vitamin D3 (CALCIUM 600+D3) 600-400 MG-UNIT TABS Take 1 tablet by mouth daily.  . cholecalciferol (VITAMIN D) 1000 UNITS tablet Take 1,000 Units by mouth daily.   [name] desipramine (NORPRAMIN) 25 MG tablet Take 25 mg by mouth daily.  . famotidine (PEPCID) 20 MG tablet Take 20 mg by mouth daily.  [name] glucosamine-chondroitin 500-400 MG tablet Take 1 tablet by mouth every other day.   . linaclotide (LINZESS) 145 MCG CAPS capsule Take 145 mcg by mouth daily before breakfast.   . LORazepam (ATIVAN) 1 MG tablet Take 1 tablet by mouth at bedtime.  . metoprolol tartrate (LOPRESSOR) 25 MG tablet Take 0.5 tablets (12.5 mg total) by mouth 2 (two) times daily.  . Multiple Vitamins-Minerals (OCUVITE PRESERVISION PO) Take 1 tablet by mouth daily.  Melinda [name] Glycol-Propyl Glycol (SYSTANE) 0.4-0.3 % GEL ophthalmic gel Place 1 application into both eyes at bedtime.   . polyethylene glycol (MIRALAX / GLYCOLAX) packet Take 17 g by mouth at bedtime. Mix in 8 oz liquid and drink  . PRESCRIPTION MEDICATION CPAP with oxygen at bedtime  . torsemide (DEMADEX) 10 MG tablet Take 1 tablet (10 mg total) by mouth every other day.  . vitamin B-12 (CYANOCOBALAMIN) 1000  MCG tablet Take 1,000 mcg by mouth daily.  [name] KLOR-CON 10 10 MEQ tablet TAKE 2 TABLETS (20 MEQ TOTAL) BY MOUTH DAILY.   No facility-administered encounter medications on file as of 04/11/2017.     Functional Status: In your present state of health, do you have any difficulty performing the following activities: 03/23/2017 02/24/2017  Hearing? N Y  Vision? Y N  Difficulty concentrating or making decisions? N Y  Comment - "sometimes"  Walking or climbing stairs? N N  Dressing or bathing? N N  Doing errands, shopping? N N  Some recent data might be hidden    Fall/Depression Screening: No flowsheet data found. No flowsheet data found.  ASSESSMENT Date Discharged from [name]: 03/24/17 Date Medication Reconciliation Performed: 04/11/2017  Medications Discontinued at Discharge: (Delete if not applicable)  Metoprolol succinate  No new medications were prescribed at discharge.  Patient was recently discharged from [name] and all medications have been reviewed  Drugs sorted by system:  Neurologic/Psychologic: lorazepam  Cardiovascular: amiodarone, apixaban, atorvastatin, metoprolol?, torsemide  Gastrointestinal: calcium/vitamin D, pepcid, glucosamine-chondroitin, klor-con, linzess, MVI, miralax, vitamin b-12  Endocrine: desipramine  Topical: systane  Pain: APAP  Duplications in therapy: metoprolol succinate in the [name], metoprolol tartrate at home? Medications to avoid in the elderly: lorazepam   Melinda [name], PharmD, [place] PGY1 Pharmacy Resident Pager: [phone]

## 2017-04-11 NOTE — Telephone Encounter (Signed)
Yes, if he is seeing her next.

## 2017-04-13 DIAGNOSIS — R69 Illness, unspecified: Secondary | ICD-10-CM | POA: Diagnosis not present

## 2017-04-17 NOTE — Progress Notes (Signed)
Cardiology Office Note   Date:  04/20/2017   ID:  Melinda [name], DOB 01-18-1932, MRN [id number]  PCP:  Crist Infante, MD  Cardiologist:   Lebert Lovern Martin[place], MD   Chief Complaint  Patient presents with  . Atrial Fibrillation      History of Present Illness: Melinda [name] is a 84 y.o. female who presents for follow up of Afib and dyspnea.  She was seen by me in 2009. At that time she had a normal stress Myoview study. Echo showed mild LVH with normal EF. Mild pulmonary HTN.   She was seen in early May 2017 with symptoms of increased dyspnea on exertion. No cough, wheezing, edema. Weight was stable.  On one occasion she walked to her mailbox and developed a heavy weight on her chest. She is s/p a right THR. She does have a history of OSA and is followed by Dr. Brett Fairy and using CPAP.    After my visit in early May 2017 she was scheduled for a repeat Echo and a Myoview study. Before these could be done she was admitted May 7-9 with acute chest pain. She was found to be in Atrial flutter with controlled rate (new since April). She ruled out for MI. Echo showed normal LV function with mild LVH, mild MR, moderate LA, RA, and RV enlargement with pulmonary HTN estimated at 50 mm Hg. She underwent right and left heart cath that showed nonobstructive CAD, mild pulmonary HTN- more consistent with pulmonary arterial hypertension. CT of the chest was normal with no evidence of PE. She had subsequent PFTs as an outpatient showing normal volumes with mild obstruction and decreased diffusion capacity. She was started on diuretic therapy and anticoagulated. She underwent successful DCCV on November 04, 2015.   When seen again in November 2017 she had recurrent Afib. Beta blocker dose was increased and she had repeat DCCV on December 20. She was admitted 4 days later with hypoxia and acute respiratory distress. ? LLL PNA. Treated with antibiotics and gently diuresed. Echo showed normal LV function. She  had severe biatrial enlargement and pulmonary HTN. Seen back in ED on 03/11/16 with recurrent Afib. Rate controlled. CXR showed no infiltrates. CT chest without acute findings.  Seen for follow up in Afib clinic. It was felt that she was not a candidate for Tikosyn due to cost. Tier 4. Due to the fact that she was significantly symptomatic with Afib she was admitted in March for initiation of sotalol therapy. DC on 160 mg bid.  She is still on eliquis.  She was seen in late September with symptoms of tachycardia and fatigue. Was found to be in atrial flutter versus atrial tachy. Beta blocker increased without improvement. Underwent DCCV on October 8 successfully.   In December she was admitted with atypical atrial flutter with 2:1 AV conduction. Seen by Dr. Lovena Le. Sotalol discontinued and she was started on amiodarone. She was admitted on March 22, 2017 with chest pain. Ruled out for MI. She underwent DCCV for her atrial flutter successfully. Post cardioversion she was bradycardic and profoundly hypotensive requiring pressors. Metoprolol was held. And pressures and HR improved. She had some delirium/sundowning. When seen back on 1/29 she was in NSR but HR faster. Low dose metoprolol resumed.   On follow up today she reports she had significant memory issues for 2 weeks following her DCCV. She denies any recurrent chest pain, dyspnea, palpitations. BP has been running higher and pulse rate in 90s.  Past Medical History:  Diagnosis Date  . A-fib ([place])   . Acid reflux   . Anxiety   . Arthritis   . Cataract    surgery,bilateral  . Constipation   . Depression   . Diabetes mellitus without complication (HCC)    Pre diabetes  . Dyspnea on exertion    a. 07/2015 Echo: EF 55-60%, no rwma, mild MR, mod dil LA/RA/RV, mod TR, triv PR, PASP 49mmHg.  . Falls   . Family history of adverse reaction to anesthesia    mother always had n/v  . Hx: UTI (urinary tract infection)   .  Hypercholesterolemia   . Hypertensive heart disease   . Insomnia   . Multifactorial gait disorder   . Non-obstructive CAD    a. 07/2015 Cath: LM 30-40d, LAD min irregs, LCX min irregs, RCA 30p, RA 17, RV 33/7, PA 42/19, PCWP 17.  . On home oxygen therapy    "2L w/CPAP at night" (02/24/2017)  . Osteoarthritis    a. 06/2015 s/p R total hip arthroplasty.  [name] PAF (paroxysmal atrial fibrillation) ([place])    a. Dx 07/2015, CHA2DS2VASc = 6-->Eliquis.  . Premature atrial contractions   . Pulmonary hypertension ([place])    a. 07/2015 Echo: PASP 74mmHg.  [name] Pyloric antral stenosis    in childhood,infancy  . Sleep apnea with use of continuous positive airway pressure (CPAP) 10/19/2012   a. compliant w/ CPAP.    Past Surgical History:  Procedure Laterality Date  . ABDOMINAL HYSTERECTOMY  1970  . ABDOMINAL SURGERY     as a 57 day old baby  . CARDIAC CATHETERIZATION N/A 07/14/2015   Procedure: Right/Left Heart Cath and Coronary Angiography;  Surgeon: Sanda Klein, MD;  Location: [facility];  Service: Cardiovascular;  Laterality: N/A;  . CARDIOVERSION N/A 11/04/2015   Procedure: CARDIOVERSION;  Surgeon: Lelon Perla, MD;  Location[facility] ENDOSCOPY;  Service: Cardiovascular;  Laterality: N/A;  . CARDIOVERSION N/A 02/25/2016   Procedure: CARDIOVERSION;  Surgeon: Pixie Casino, MD;  Location: [facility] ENDOSCOPY;  Service: Cardiovascular;  Laterality: N/A;  . CARDIOVERSION N/A 12/13/2016   Procedure: CARDIOVERSION;  Surgeon: Fay Records, MD;  Location: [facility] ENDOSCOPY;  Service: Cardiovascular;  Laterality: N/A;  . CARDIOVERSION N/A 03/23/2017   Procedure: CARDIOVERSION;  Surgeon: Sueanne Margarita, MD;  Location: [facility] ENDOSCOPY;  Service: Cardiovascular;  Laterality: N/A;  . COLONOSCOPY    . EYE SURGERY Bilateral    cataract surgery with lens implant  . INNER EAR SURGERY Left    x 3  . JOINT REPLACEMENT Right   . LAPAROSCOPIC SALPINGO OOPHERECTOMY    . NM MYOVIEW LTD  11/30/2007   normal stress nuclear  study/EF-83%  . TONSILLECTOMY    . TOTAL HIP ARTHROPLASTY Right 06/26/2015   Procedure: RIGHT TOTAL HIP ARTHROPLASTY ANTERIOR APPROACH;  Surgeon: Leandrew Koyanagi, MD;  Location: [facility];  Service: Orthopedics;  Laterality: Right;  . TRANSTHORACIC ECHOCARDIOGRAM  07/23/2009   mild concentric LVH/mild mitral insufficiency/ moderate tricuspid innsufficiency/mild pulmonary HTN/EF- 55-60%  . VAGINAL DELIVERY       Current Outpatient Medications  Medication Sig Dispense Refill  . acetaminophen (TYLENOL) 650 MG CR tablet Take 650-1,300 mg by mouth 2 (two) times daily as needed (for arthritis pain).     [name] amiodarone (PACERONE) 200 MG tablet Take 1 tablet (200 mg total) by mouth 2 (two) times daily. 180 tablet 3  . apixaban (ELIQUIS) 5 MG TABS tablet Take 1 tablet (5 mg total) by mouth  2 (two) times daily. 60 tablet 0  . atorvastatin (LIPITOR) 40 MG tablet Take 40 mg by mouth daily.     . Calcium Carbonate-Vitamin D3 (CALCIUM 600+D3) 600-400 MG-UNIT TABS Take 1 tablet by mouth daily.    . cholecalciferol (VITAMIN D) 1000 UNITS tablet Take 1,000 Units by mouth daily.     [name] desipramine (NORPRAMIN) 25 MG tablet Take 25 mg by mouth daily.    . famotidine (PEPCID) 20 MG tablet Take 20 mg by mouth daily.    [name] glucosamine-chondroitin 500-400 MG tablet Take 1 tablet by mouth every other day.     . linaclotide (LINZESS) 145 MCG CAPS capsule Take 145 mcg by mouth daily before breakfast.     . LORazepam (ATIVAN) 1 MG tablet Take 1 tablet by mouth at bedtime.  5  . metoprolol tartrate (LOPRESSOR) 25 MG tablet Take 1 tablet (25 mg total) by mouth 2 (two) times daily. 90 tablet 3  . Multiple Vitamins-Minerals (OCUVITE PRESERVISION PO) Take 1 tablet by mouth daily.    [name] Glycol-Propyl Glycol (SYSTANE) 0.4-0.3 % GEL ophthalmic gel Place 1 application into both eyes at bedtime.     . polyethylene glycol (MIRALAX / GLYCOLAX) packet Take 17 g by mouth at bedtime. Mix in 8 oz liquid and drink    . PRESCRIPTION  MEDICATION CPAP with oxygen at bedtime    . torsemide (DEMADEX) 10 MG tablet Take 1 tablet (10 mg total) by mouth every other day. 30 tablet 0  . vitamin B-12 (CYANOCOBALAMIN) 1000 MCG tablet Take 1,000 mcg by mouth daily.    [name] KLOR-CON 10 10 MEQ tablet TAKE 2 TABLETS (20 MEQ TOTAL) BY MOUTH DAILY. 60 tablet 6   No current facility-administered medications for this visit.     Allergies:   Lasix [furosemide]; Phenobarbital; Adhesive [tape]; and Amitiza [lubiprostone]    Social History:  The patient  reports that  has never smoked. she has never used smokeless tobacco. She reports that she does not drink alcohol or use drugs.   Family History:  The patient's family history includes Cancer in her mother; Diabetes in her sister; Heart attack in her father; Heart failure in her mother and sister; Hypertension in her sister.    ROS:  Please see the history of present illness.   Otherwise, review of systems are positive for none.   All other systems are reviewed and negative.    PHYSICAL EXAM: VS:  BP 130/70   Pulse 86   Ht 5' (1.524 m)   Wt 171 lb 6.4 oz (77.7 kg)   BMI 33.47 kg/m  , BMI Body mass index is 33.47 kg/m. GENERAL:  Well appearing obese WF in NAD HEENT:  PERRL, EOMI, sclera are clear. Oropharynx is clear. NECK:  No jugular venous distention, carotid upstroke brisk and symmetric, no bruits, no thyromegaly or adenopathy LUNGS:  Clear to auscultation bilaterally CHEST:  Unremarkable HEART:  RRR,  PMI not displaced or sustained,S1 and S2 within normal limits, no S3, no S4: no clicks, no rubs, no murmurs ABD:  Soft, nontender. BS +, no masses or bruits. No hepatomegaly, no splenomegaly EXT:  2 + pulses throughout, no edema, no cyanosis no clubbing SKIN:  Warm and dry.  No rashes NEURO:  Alert and oriented x 3. Cranial nerves II through XII intact. PSYCH:  Cognitively intact   SKIN:  Warm and dry.  No rashes NEURO:  Alert and oriented x 3. Cranial nerves II through XII  intact. PSYCH:  Cognitively intact     EKG:  EKG is not ordered today.   Recent Labs: 11/30/2016: Magnesium 2.1 03/23/2017: ALT 19; TSH 3.334 04/05/2017: BUN 30; Creatinine, Ser 1.63; Hemoglobin 13.3; Platelets 307; Potassium 4.7; Sodium 138    Lipid Panel    Component Value Date/Time   CHOL 153 02/23/2017 0807   TRIG 137 02/23/2017 0807   HDL 62 02/23/2017 0807   CHOLHDL 2.5 02/23/2017 0807   VLDL 27 02/23/2017 0807   LDLCALC 64 02/23/2017 0807      Wt Readings from Last 3 Encounters:  04/20/17 171 lb 6.4 oz (77.7 kg)  04/05/17 163 lb 9.6 oz (74.2 kg)  03/24/17 172 lb 2.9 oz (78.1 kg)      Other studies Reviewed:  Lab Results  Component Value Date   WBC 7.9 04/05/2017   HGB 13.3 04/05/2017   HCT 39.5 04/05/2017   PLT 307 04/05/2017   GLUCOSE 112 (H) 04/05/2017   CHOL 153 02/23/2017   TRIG 137 02/23/2017   HDL 62 02/23/2017   LDLCALC 64 02/23/2017   ALT 19 03/23/2017   AST 20 03/23/2017   NA 138 04/05/2017   K 4.7 04/05/2017   CL 97 04/05/2017   CREATININE 1.63 (H) 04/05/2017   BUN 30 (H) 04/05/2017   CO2 20 04/05/2017   TSH 3.334 03/23/2017   INR 1.1 10/29/2015   HGBA1C 7.0 (H) 02/23/2017     Labs dated 05/19/16: Cholesterol 170, triglycerides 151, HDL 59, LDL 81. A1c 6.9%. TSH normal.  Dated 09/21/16: A1c 6.7%  Echo 03/02/16: Study Conclusions  - Left ventricle: The cavity size was normal. Systolic function was   normal. The estimated ejection fraction was in the range of 60%   to 65%. Wall motion was normal; there were no regional wall   motion abnormalities. The study is not technically sufficient to   allow evaluation of LV diastolic function. - Aortic valve: Transvalvular velocity was within the normal range.   There was no stenosis. There was no regurgitation. - Mitral valve: Calcified annulus. Transvalvular velocity was   within the normal range. There was no evidence for stenosis.   There was moderate regurgitation directed centrally. -  Left atrium: The atrium was severely dilated. - Right ventricle: The cavity size was normal. Wall thickness was   normal. Systolic function was normal. - Right atrium: The atrium was severely dilated. - Tricuspid valve: There was moderate regurgitation. - Pulmonic valve: There was moderate regurgitation. - Pulmonary arteries: Systolic pressure was severely increased. PA   peak pressure: 71 mm Hg (S). - Pericardium, extracardiac: A small pericardial effusion was   identified. Features were not consistent with tamponade   physiology.   Echo 02/24/17: Study Conclusions  - Left ventricle: The cavity size was normal. Systolic function was   mildly reduced. The estimated ejection fraction was in the range   of 45% to 50%. Diffuse hypokinesis worse in the septal   myocardium. - Aortic valve: There was no regurgitation. - Mitral valve: Mildly calcified annulus. There was mild   regurgitation. - Left atrium: The atrium was moderately dilated. - Right ventricle: The cavity size was normal. Wall thickness was   normal. Systolic function was normal. - Right atrium: The atrium was severely dilated. - Atrial septum: No defect or patent foramen ovale was identified. - Tricuspid valve: There was mild regurgitation. - Pulmonary arteries: Systolic pressure was within the normal   range. PA peak pressure: 32 mm Hg (S). - Pericardium, extracardiac: A small  pericardial effusion was   identified. There was evidence for increased RV-LV interaction   demonstrated by respirophasic changes in transmitral velocities   and tricuspid velocities.  Impressions:  - Variation was noted in the mitral and tricuspid valve inflow.   This is likely due to atrial fibrillation rather than tamponade.   IVC collapses normally.  ASSESSMENT AND PLAN:  1. Atrial fibrillation paroxysmal. S/p DCCV x 2 previously with early return of Afib. Recent recurrence of atypical Atrial flutter  on Sotalol. Now on amiodarone.   S/p DCCV with profound hypotension post procedure.   On Eliquis for anticoagulation. Will increase metoprolol to 25 mg bid. Reduce amiodarone to 200 mg daily. To see Dr. Lovena Le in one week.  Follow up in 6 months.  2. Atypical chest pain. Nonobstructive CAD by cath in 2017.   3. OSA on CPAP  4. Pulmonary HTN- right heart cath suggests PAH. CT of chest negative.   5. HTN  6. Hyperlipidemia.    Current medicines are reviewed at length with the patient today.  The patient does not have concerns regarding medicines.  The following changes have been made:  no change  Labs/ tests ordered today include:  No orders of the defined types were placed in this encounter.  Follow up in 3 months.  Signed, Iyahna Obriant Martinique, MD  04/20/2017 2:22 PM    [facility] [address] [place], [name], [address] Phone [phone], Fax [phone]

## 2017-04-20 ENCOUNTER — Ambulatory Visit: Payer: Medicare HMO | Admitting: Cardiology

## 2017-04-20 ENCOUNTER — Encounter: Payer: Self-pay | Admitting: Cardiology

## 2017-04-20 VITALS — BP 130/70 | HR 86 | Ht 60.0 in | Wt 171.4 lb

## 2017-04-20 DIAGNOSIS — I10 Essential (primary) hypertension: Secondary | ICD-10-CM

## 2017-04-20 DIAGNOSIS — R0609 Other forms of dyspnea: Secondary | ICD-10-CM

## 2017-04-20 DIAGNOSIS — I4891 Unspecified atrial fibrillation: Secondary | ICD-10-CM

## 2017-04-20 DIAGNOSIS — I484 Atypical atrial flutter: Secondary | ICD-10-CM

## 2017-04-20 DIAGNOSIS — I2510 Atherosclerotic heart disease of native coronary artery without angina pectoris: Secondary | ICD-10-CM

## 2017-04-20 DIAGNOSIS — R0600 Dyspnea, unspecified: Secondary | ICD-10-CM

## 2017-04-20 MED ORDER — METOPROLOL TARTRATE 25 MG PO TABS
25.0000 mg | ORAL_TABLET | Freq: Two times a day (BID) | ORAL | 3 refills | Status: DC
Start: 2017-04-20 — End: 2017-05-23

## 2017-04-20 NOTE — Patient Instructions (Addendum)
Increase metoprolol to 25 mg twice a day  Reduce amiodarone to 200 mg daily.   Continue your other therapy  I will see you in 3 months

## 2017-04-29 ENCOUNTER — Ambulatory Visit: Payer: Medicare HMO | Admitting: Internal Medicine

## 2017-04-29 ENCOUNTER — Encounter: Payer: Self-pay | Admitting: Internal Medicine

## 2017-04-29 VITALS — BP 134/74 | HR 100 | Ht 60.0 in | Wt 167.0 lb

## 2017-04-29 DIAGNOSIS — I484 Atypical atrial flutter: Secondary | ICD-10-CM

## 2017-04-29 DIAGNOSIS — I5032 Chronic diastolic (congestive) heart failure: Secondary | ICD-10-CM | POA: Diagnosis not present

## 2017-04-29 DIAGNOSIS — I4891 Unspecified atrial fibrillation: Secondary | ICD-10-CM | POA: Diagnosis not present

## 2017-04-29 DIAGNOSIS — I10 Essential (primary) hypertension: Secondary | ICD-10-CM

## 2017-04-29 NOTE — Progress Notes (Signed)
HPI Melinda [name] returns today for ongoing evaluation and management of atypical atrial flutter with a RVR. She was placed on amiodarone several weeks ago after presenting with rapid atrial flutter. She has felt better the past couple of days. Her sob is better. No syncope. No edema. She underwent DCCV about a month ago and this was complicated by hypotension and bradycardia. She saw Dr. Martinique and was thought to be in NSR although her ECG from 1/29 showed atypical atrial flutter. At rest she feels well. With exertion she gets sob and has been this way for many years.  Allergies  Allergen Reactions  . Lasix [Furosemide] Itching  . Phenobarbital Itching  . Adhesive [Tape] Other (See Comments)    SKIN WILL TEAR EASILY; PLEASE USE PAPER TAPE OR COBAN WRAP!!  . Amitiza [Lubiprostone] Itching     Current Outpatient Medications  Medication Sig Dispense Refill  . acetaminophen (TYLENOL) 650 MG CR tablet Take 650-1,300 mg by mouth 2 (two) times daily as needed (for arthritis pain).     [name] amiodarone (PACERONE) 200 MG tablet Take 1 tablet (200 mg total) by mouth 2 (two) times daily. 180 tablet 3  . apixaban (ELIQUIS) 5 MG TABS tablet Take 1 tablet (5 mg total) by mouth 2 (two) times daily. 60 tablet 0  . atorvastatin (LIPITOR) 40 MG tablet Take 40 mg by mouth daily.     . Calcium Carbonate-Vitamin D3 (CALCIUM 600+D3) 600-400 MG-UNIT TABS Take 1 tablet by mouth daily.    . cholecalciferol (VITAMIN D) 1000 UNITS tablet Take 1,000 Units by mouth daily.     [name] desipramine (NORPRAMIN) 25 MG tablet Take 25 mg by mouth daily.    . famotidine (PEPCID) 20 MG tablet Take 20 mg by mouth daily.    [name] glucosamine-chondroitin 500-400 MG tablet Take 1 tablet by mouth every other day.     [name] KLOR-CON 10 10 MEQ tablet TAKE 2 TABLETS (20 MEQ TOTAL) BY MOUTH DAILY. 60 tablet 6  . linaclotide (LINZESS) 145 MCG CAPS capsule Take 145 mcg by mouth daily before breakfast.     . LORazepam (ATIVAN) 1 MG tablet Take  1 tablet by mouth at bedtime.  5  . metoprolol tartrate (LOPRESSOR) 25 MG tablet Take 1 tablet (25 mg total) by mouth 2 (two) times daily. 90 tablet 3  . Multiple Vitamins-Minerals (OCUVITE PRESERVISION PO) Take 1 tablet by mouth daily.    [name] Glycol-Propyl Glycol (SYSTANE) 0.4-0.3 % GEL ophthalmic gel Place 1 application into both eyes at bedtime.     . polyethylene glycol (MIRALAX / GLYCOLAX) packet Take 17 g by mouth at bedtime. Mix in 8 oz liquid and drink    . PRESCRIPTION MEDICATION CPAP with oxygen at bedtime    . torsemide (DEMADEX) 10 MG tablet Take 1 tablet (10 mg total) by mouth every other day. 30 tablet 0  . vitamin B-12 (CYANOCOBALAMIN) 1000 MCG tablet Take 1,000 mcg by mouth daily.     No current facility-administered medications for this visit.      Past Medical History:  Diagnosis Date  . A-fib ([place])   . Acid reflux   . Anxiety   . Arthritis   . Cataract    surgery,bilateral  . Constipation   . Depression   . Diabetes mellitus without complication (HCC)    Pre diabetes  . Dyspnea on exertion    a. 07/2015 Echo: EF 55-60%, no rwma, mild MR, mod dil LA/RA/RV, mod TR, triv  PR, PASP 39mmHg.  . Falls   . Family history of adverse reaction to anesthesia    mother always had n/v  . Hx: UTI (urinary tract infection)   . Hypercholesterolemia   . Hypertensive heart disease   . Insomnia   . Multifactorial gait disorder   . Non-obstructive CAD    a. 07/2015 Cath: LM 30-40d, LAD min irregs, LCX min irregs, RCA 30p, RA 17, RV 33/7, PA 42/19, PCWP 17.  . On home oxygen therapy    "2L w/CPAP at night" (02/24/2017)  . Osteoarthritis    a. 06/2015 s/p R total hip arthroplasty.  [name] PAF (paroxysmal atrial fibrillation) ([place])    a. Dx 07/2015, CHA2DS2VASc = 6-->Eliquis.  . Premature atrial contractions   . Pulmonary hypertension ([place])    a. 07/2015 Echo: PASP 33mmHg.  [name] Pyloric antral stenosis    in childhood,infancy  . Sleep apnea with use of continuous positive airway  pressure (CPAP) 10/19/2012   a. compliant w/ CPAP.    ROS:   All systems reviewed and negative except as noted in the HPI.   Past Surgical History:  Procedure Laterality Date  . ABDOMINAL HYSTERECTOMY  1970  . ABDOMINAL SURGERY     as a 39 day old baby  . CARDIAC CATHETERIZATION N/A 07/14/2015   Procedure: Right/Left Heart Cath and Coronary Angiography;  Surgeon: Sanda Klein, MD;  Location: [facility];  Service: Cardiovascular;  Laterality: N/A;  . CARDIOVERSION N/A 11/04/2015   Procedure: CARDIOVERSION;  Surgeon: Lelon Perla, MD;  Location: [facility] ENDOSCOPY;  Service: Cardiovascular;  Laterality: N/A;  . CARDIOVERSION N/A 02/25/2016   Procedure: CARDIOVERSION;  Surgeon: Pixie Casino, MD;  Location: [facility]OSCOPY;  Service: Cardiovascular;  Laterality: N/A;  . CARDIOVERSION N/A 12/13/2016   Procedure: CARDIOVERSION;  Surgeon: Fay Records, MD;  Location: [facility] ENDOSCOPY;  Service: Cardiovascular;  Laterality: N/A;  . CARDIOVERSION N/A 03/23/2017   Procedure: CARDIOVERSION;  Surgeon: Sueanne Margarita, MD;  Location: [facility] ENDOSCOPY;  Service: Cardiovascular;  Laterality: N/A;  . COLONOSCOPY    . EYE SURGERY Bilateral    cataract surgery with lens implant  . INNER EAR SURGERY Left    x 3  . JOINT REPLACEMENT Right   . LAPAROSCOPIC SALPINGO OOPHERECTOMY    . NM MYOVIEW LTD  11/30/2007   normal stress nuclear study/EF-83%  . TONSILLECTOMY    . TOTAL HIP ARTHROPLASTY Right 06/26/2015   Procedure: RIGHT TOTAL HIP ARTHROPLASTY ANTERIOR APPROACH;  Surgeon: Leandrew Koyanagi, MD;  Location: [facility];  Service: Orthopedics;  Laterality: Right;  . TRANSTHORACIC ECHOCARDIOGRAM  07/23/2009   mild concentric LVH/mild mitral insufficiency/ moderate tricuspid innsufficiency/mild pulmonary HTN/EF- 55-60%  . VAGINAL DELIVERY       Family History  Problem Relation Age of Onset  . Heart attack Father   . Heart failure Mother   . Cancer Mother        colon,kidney  . Diabetes Sister   . Hypertension  Sister   . Heart failure Sister      Social History   Socioeconomic History  . Marital status: Widowed    Spouse name: Not on file  . Number of children: 1  . Years of education: 29  . Highest education level: Not on file  Social Needs  . Financial resource strain: Not on file  . Food insecurity - worry: Not on file  . Food insecurity - inability: Not on file  . Transportation needs - medical: Not on file  .  Transportation needs - non-medical: Not on file  Occupational History  . Occupation: Retired  Tobacco Use  . Smoking status: Never Smoker  . Smokeless tobacco: Never Used  Substance and Sexual Activity  . Alcohol use: No    Alcohol/week: 0.0 oz    Comment: rare  . Drug use: No  . Sexual activity: Not on file  Other Topics Concern  . Not on file  Social History Narrative   Patient lives alone.    Patient is widowed.    Patient retired.    Patient some college.    Patient has one child.    Caffeine 1-2 cups daily avg.     BP 134/74   Pulse 100   Ht 5' (1.524 m)   Wt 167 lb (75.8 kg)   SpO2 93%   BMI 32.61 kg/m   Physical Exam:  stable appearing 84 yo woman, NAD HEENT: Unremarkable Neck:  6 cm JVD, no thyromegally Lymphatics:  No adenopathy Back:  No CVA tenderness Lungs:  Clear with no wheezes HEART:  Regular rate rhythm, no murmurs, no rubs, no clicks Abd:  soft, positive bowel sounds, no organomegally, no rebound, no guarding Ext:  2 plus pulses, no edema, no cyanosis, no clubbing Skin:  No rashes no nodules Neuro:  CN II through XII intact, motor grossly intact  EKG - atrial flutter with 2:1 AV conduction   Assess/Plan: 1. Atypical atrial flutter - she has reverted back to atrial flutter despite amiodarone. She is not a great candidate for ablation and I am not sure that her flutter is in the RA. We will continue her current meds. We discussed ablation. With her multiple comorbidities, I would prefer to hold off on attempted ablation for now.  If she does not revert back to NSR on her own, will reconsider ablation in a couple of months. 2. Atrial fib - this appears to have resolved on amiodarone. 3. Chest pain - this appears to be better now that her ventricular rate is improved. 4. Pulm HTN - results of her workup are noted. She will continue her current meds.  Melinda [name].D.

## 2017-04-29 NOTE — Patient Instructions (Addendum)
Medication Instructions:  Your physician recommends that you continue on your current medications as directed. Please refer to the Current Medication list given to you today.  Labwork: None ordered.  Testing/Procedures: None ordered.  Follow-Up: Your physician wants you to follow-up in: May or June 2019 with Dr. Lovena Le.    Any Other Special Instructions Will Be Listed Below (If Applicable).  If you need a refill on your cardiac medications before your next appointment, please call your pharmacy.

## 2017-05-02 NOTE — Addendum Note (Signed)
Addended by: Marlis Edelson C on: 05/02/2017 10:53 AM   Modules accepted: Orders

## 2017-05-04 DIAGNOSIS — R0902 Hypoxemia: Secondary | ICD-10-CM | POA: Diagnosis not present

## 2017-05-04 DIAGNOSIS — I5043 Acute on chronic combined systolic (congestive) and diastolic (congestive) heart failure: Secondary | ICD-10-CM | POA: Diagnosis not present

## 2017-05-04 DIAGNOSIS — G4733 Obstructive sleep apnea (adult) (pediatric): Secondary | ICD-10-CM | POA: Diagnosis not present

## 2017-05-04 DIAGNOSIS — R69 Illness, unspecified: Secondary | ICD-10-CM | POA: Diagnosis not present

## 2017-05-06 ENCOUNTER — Ambulatory Visit
Admission: RE | Admit: 2017-05-06 | Discharge: 2017-05-06 | Disposition: A | Discharge status: Home / Self Care | Payer: Medicare HMO | Source: Ambulatory Visit | Attending: Internal Medicine | Admitting: Internal Medicine

## 2017-05-06 DIAGNOSIS — Z1231 Encounter for screening mammogram for malignant neoplasm of breast: Secondary | ICD-10-CM

## 2017-05-22 ENCOUNTER — Telehealth: Payer: Self-pay | Admitting: Physician Assistant

## 2017-05-22 NOTE — Telephone Encounter (Signed)
   Patient has been having problems with bradycardia for a couple of days now.  Her heart rate is sustaining in the low 50s.  She is having problems with getting short of breath walking room to room in the house.  She denies any weight changes, denies orthopnea or PND.  Her family put her on oxygen and that seems to help a little bit.    She is compliant with her medications taking torsemide 10 mg a day and metoprolol 25 mg twice daily.  Advised I am not sure if the symptoms are from the low heart rate or her CHF.  For now, I will cut back on the metoprolol and ask her to take 1/2 tablet which is 12.5 mg twice a day.  Continue amiodarone and torsemide for now.  Continue to track weights.    I have sent a message to the office, she should get an appointment early next week to be evaluated.  The family will call on Monday if she is not any better.  Rosaria Ferries, PA-C 05/22/2017 12:48 PM Bee[facility] [phone]

## 2017-05-23 ENCOUNTER — Telehealth: Payer: Self-pay | Admitting: Cardiology

## 2017-05-23 NOTE — Telephone Encounter (Signed)
Returned call to patient.She stated her grandson spoke to a Dr.on call yesterday heart rate ranging 50,51,48.Feels tired,sob.Slight swelling in lower legs and feet.Weight stable.Rhonda Barrett PA advised yesterday metoprolol 25 mg 1/2 tablet twice a day.Advised make appointment.Appointment scheduled with Almyra Deforest PA 05/31/17 at 10:00 am.Advised to keep a diary of pulse,B/P bring readings and all medications to appointment.

## 2017-05-23 NOTE — Telephone Encounter (Signed)
New Message:   amiodarone (PACERONE) 200 MGTake 1 tablet (200 mg total) by mouth 2 (two) times daily.  tabletmetoprolol tartrate (LOPRESSOR) 25 MG tablet 1 25 mg 2 time a day   Pt is having a decrease in heart rate 103 and dropped down to 97 and currently at 48

## 2017-05-24 DIAGNOSIS — K219 Gastro-esophageal reflux disease without esophagitis: Secondary | ICD-10-CM | POA: Diagnosis not present

## 2017-05-24 DIAGNOSIS — G8929 Other chronic pain: Secondary | ICD-10-CM | POA: Diagnosis not present

## 2017-05-24 DIAGNOSIS — I10 Essential (primary) hypertension: Secondary | ICD-10-CM | POA: Diagnosis not present

## 2017-05-24 DIAGNOSIS — R69 Illness, unspecified: Secondary | ICD-10-CM | POA: Diagnosis not present

## 2017-05-24 DIAGNOSIS — E785 Hyperlipidemia, unspecified: Secondary | ICD-10-CM | POA: Diagnosis not present

## 2017-05-24 DIAGNOSIS — I4891 Unspecified atrial fibrillation: Secondary | ICD-10-CM | POA: Diagnosis not present

## 2017-05-24 DIAGNOSIS — E669 Obesity, unspecified: Secondary | ICD-10-CM | POA: Diagnosis not present

## 2017-05-24 DIAGNOSIS — K5900 Constipation, unspecified: Secondary | ICD-10-CM | POA: Diagnosis not present

## 2017-05-24 DIAGNOSIS — G4730 Sleep apnea, unspecified: Secondary | ICD-10-CM | POA: Diagnosis not present

## 2017-05-25 DIAGNOSIS — E119 Type 2 diabetes mellitus without complications: Secondary | ICD-10-CM | POA: Diagnosis not present

## 2017-05-25 DIAGNOSIS — E7849 Other hyperlipidemia: Secondary | ICD-10-CM | POA: Diagnosis not present

## 2017-05-25 DIAGNOSIS — R82998 Other abnormal findings in urine: Secondary | ICD-10-CM | POA: Diagnosis not present

## 2017-05-25 DIAGNOSIS — M859 Disorder of bone density and structure, unspecified: Secondary | ICD-10-CM | POA: Diagnosis not present

## 2017-05-31 ENCOUNTER — Other Ambulatory Visit: Payer: Self-pay

## 2017-05-31 ENCOUNTER — Encounter: Payer: Self-pay | Admitting: Physician Assistant

## 2017-05-31 ENCOUNTER — Ambulatory Visit: Payer: Medicare HMO | Admitting: Physician Assistant

## 2017-05-31 VITALS — BP 162/76 | HR 55 | Ht 60.0 in | Wt 171.8 lb

## 2017-05-31 DIAGNOSIS — E119 Type 2 diabetes mellitus without complications: Secondary | ICD-10-CM

## 2017-05-31 DIAGNOSIS — R001 Bradycardia, unspecified: Secondary | ICD-10-CM | POA: Diagnosis not present

## 2017-05-31 DIAGNOSIS — R0600 Dyspnea, unspecified: Secondary | ICD-10-CM

## 2017-05-31 DIAGNOSIS — I272 Pulmonary hypertension, unspecified: Secondary | ICD-10-CM | POA: Diagnosis not present

## 2017-05-31 DIAGNOSIS — Z9989 Dependence on other enabling machines and devices: Secondary | ICD-10-CM | POA: Diagnosis not present

## 2017-05-31 DIAGNOSIS — I10 Essential (primary) hypertension: Secondary | ICD-10-CM

## 2017-05-31 DIAGNOSIS — I484 Atypical atrial flutter: Secondary | ICD-10-CM

## 2017-05-31 DIAGNOSIS — E785 Hyperlipidemia, unspecified: Secondary | ICD-10-CM | POA: Diagnosis not present

## 2017-05-31 DIAGNOSIS — I5032 Chronic diastolic (congestive) heart failure: Secondary | ICD-10-CM

## 2017-05-31 DIAGNOSIS — G4733 Obstructive sleep apnea (adult) (pediatric): Secondary | ICD-10-CM | POA: Diagnosis not present

## 2017-05-31 MED ORDER — TORSEMIDE 10 MG PO TABS: 20.0000 mg | ORAL_TABLET | Freq: Every day | ORAL | 3 refills | Status: DC

## 2017-05-31 MED ORDER — METOPROLOL TARTRATE 25 MG PO TABS: 12.5000 mg | ORAL_TABLET | Freq: Two times a day (BID) | ORAL | 3 refills | Status: DC

## 2017-05-31 MED ORDER — AMIODARONE HCL 200 MG PO TABS: 200.0000 mg | ORAL_TABLET | Freq: Every day | ORAL | 3 refills | Status: AC

## 2017-05-31 MED ORDER — POTASSIUM CHLORIDE ER 10 MEQ PO TBCR: 20.0000 meq | EXTENDED_RELEASE_TABLET | Freq: Two times a day (BID) | ORAL | 3 refills | Status: DC

## 2017-05-31 MED ORDER — AMLODIPINE BESYLATE 5 MG PO TABS: 5.0000 mg | ORAL_TABLET | Freq: Every day | ORAL | 3 refills | Status: DC

## 2017-05-31 NOTE — Patient Instructions (Signed)
Medication Instructions:  START Norvasc 5mg  Take 1 tablet once a day DECREASE Metoprolol to 12.5mg  Take 1 tablet twice a day INCREASE Torsemide to 20mg  take 1 tablet once a day INCREASE Potassium to 22meq Take 2 tablets twice a day   Labwork: Your physician recommends that you return for lab work in: 1 week: BMET  Testing/Procedures: None   Follow-Up: Keep upcoming appointment as scheduled  Any Other Special Instructions Will Be Listed Below (If Applicable). If you need a refill on your cardiac medications before your next appointment, please call your pharmacy.

## 2017-05-31 NOTE — Progress Notes (Signed)
Cardiology Office Note    Date:  05/31/2017   ID:  Melinda [name], DOB 01/10/1932, MRN [id number]  PCP:  Crist Infante, MD  Cardiologist:  Dr. Martinique  Chief Complaint  Patient presents with  . Shortness of Breath    seen for Dr. Martinique    History of Present Illness:  LASHAWNNA [name] is a 84 y.o. female with PMH of DM II, HTN, HLD, OSA on CPAP followed by Dr. Brett Fairy, pulmonary HTN, CAD, recurrent PAF and atypical atrial flutter.  She was seen in March 2017 for increased dyspnea on exertion.  Repeat echocardiogram and Myoview were scheduled, however she ended up admitted to the hospital was acute chest pain prior to those test being down.  She was found to be in atrial flutter with controlled heart rate.  Echocardiogram at the time showed normal LV function, mild LVH, mild MR, moderate LAE, RA, RV enlargement with pulmonary hypertension estimated at 50 mmHg.  She underwent left and right heart cath that showed nonobstructive CAD, mild pulmonary hypertension more consistent with pulmonary arterial hypertension.  CT angiogram of the chest showed no evidence of PE.  Subsequent PFTs as outpatient showed normal volume with mild obstruction and a decreased diffusion capacity.  She was started on diuretic therapy and systemic anticoagulation.  She underwent successful DCCV in August 2017.  By November 2017, she had a recurrent atrial fibrillation, beta-blocker was increased.  She again underwent DCCV in September 2017.  She was admitted 4 days after that for acute respiratory distress.  Questionable left lower lobe pneumonia, treated with antibiotic and gentle diuresis.  Echocardiogram showed normal LV function severe biatrial enlargement, pulmonary hypertension.  She had a recurrent atrial fibrillation in January 2018.  She was not a candidate for Tikosyn due to cost.  She was placed on sotalol 160mg  BID in March 2018.  In September 2018, she had symptom of recurrent tachycardia and fatigue and  was found to get atrial flutter versus atrial tachycardia.  Beta-blocker was increased with improvement and she eventually underwent successful DC cardioversion in October 2018.  She had a recurrent atypical atrial flutter with 2-1 AV conduction in December 2018 and was seen by Dr. Lovena Le. Sotalol was discontinued and she was started on amiodarone.  She was admitted in January 2019 for chest pain and ruled out of MI, she underwent DC cardioversion for atrial flutter successfully.  Post cardioversion, she had bradycardia and profound hypotension requiring pressors.  Metoprolol was held and it was eventually resumed at a low dose.  She was last seen by Dr. Martinique on 04/20/2017, she did have some significant memory issues for 2 weeks following her DC cardioversion.  Based on recent telephone note on 05/22/2017, she was complaining of some bradycardia and the dyspnea with exertion.  Her metoprolol was reduced from 25 mg twice daily down to 12.5 mg twice daily.  She was instructed to document her weights.  Patient presents today for cardiology office visit.  Her weight is fairly stable at this time.  She continued to have shortness of breath.  She has been having occasional chest discomfort since her previous cardiac catheterization, however it occurs both at rest and with exertion.  I will hold off on ischemic workup at this time.  She is maintaining sinus bradycardia with heart rate in the 40s and 50s.  We ambulated the patient in the office today, her O2 saturation did drop from 96% down to 90% after walking 1.5 laps.  Her heart  rate show no change with ambulation.  Hopefully with decreased amlodipine, she had will have some degree of improved heart rate.  She also has bilateral lower extremity edema, worse on the left side, she had an episode of dehydration back in January with creatinine went up to 1.6.  She was supposed to decrease her torsemide to every other day, however at this time she says she is still on  torsemide 10 mg daily.  I want to do a trial to increase torsemide to 20 mg daily with doubling of the potassium supplement as well.  I will obtain basic metabolic panel in 1 week.  She is scheduled to get additional lab work tomorrow at her PCPs office, I asked her to send [place] a copy as well.   Past Medical History:  Diagnosis Date  . A-fib ([place])   . Acid reflux   . Anxiety   . Arthritis   . Cataract    surgery,bilateral  . Constipation   . Depression   . Diabetes mellitus without complication (HCC)    Pre diabetes  . Dyspnea on exertion    a. 07/2015 Echo: EF 55-60%, no rwma, mild MR, mod dil LA/RA/RV, mod TR, triv PR, PASP 80mmHg.  . Falls   . Family history of adverse reaction to anesthesia    mother always had n/v  . Hx: UTI (urinary tract infection)   . Hypercholesterolemia   . Hypertensive heart disease   . Insomnia   . Multifactorial gait disorder   . Non-obstructive CAD    a. 07/2015 Cath: LM 30-40d, LAD min irregs, LCX min irregs, RCA 30p, RA 17, RV 33/7, PA 42/19, PCWP 17.  . On home oxygen therapy    "2L w/CPAP at night" (02/24/2017)  . Osteoarthritis    a. 06/2015 s/p R total hip arthroplasty.  [name] PAF (paroxysmal atrial fibrillation) ([place])    a. Dx 07/2015, CHA2DS2VASc = 6-->Eliquis.  . Premature atrial contractions   . Pulmonary hypertension ([place])    a. 07/2015 Echo: PASP 73mmHg.  [name] Pyloric antral stenosis    in childhood,infancy  . Sleep apnea with use of continuous positive airway pressure (CPAP) 10/19/2012   a. compliant w/ CPAP.    Past Surgical History:  Procedure Laterality Date  . ABDOMINAL HYSTERECTOMY  1970  . ABDOMINAL SURGERY     as a 66 day old baby  . BREAST BIOPSY Left 2017  . CARDIAC CATHETERIZATION N/A 07/14/2015   Procedure: Right/Left Heart Cath and Coronary Angiography;  Surgeon: Sanda Klein, MD;  Location: [place] [facility];  Service: Cardiovascular;  Laterality: N/A;  . CARDIOVERSION N/A 11/04/2015   Procedure: CARDIOVERSION;  Surgeon:  Lelon Perla, MD;  Location: [facility] ENDOSCOPY;  Service: Cardiovascular;  Laterality: N/A;  . CARDIOVERSION N/A 02/25/2016   Procedure: CARDIOVERSION;  Surgeon: Pixie Casino, MD;  Location: [facility] ENDOSCOPY;  Service: Cardiovascular;  Laterality: N/A;  . CARDIOVERSION N/A 12/13/2016   Procedure: CARDIOVERSION;  Surgeon: Fay Records, MD;  Location: [facility] ENDOSCOPY;  Service: Cardiovascular;  Laterality: N/A;  . CARDIOVERSION N/A 03/23/2017   Procedure: CARDIOVERSION;  Surgeon: Sueanne Margarita, MD;  Location: [facility] ENDOSCOPY;  Service: Cardiovascular;  Laterality: N/A;  . COLONOSCOPY    . EYE SURGERY Bilateral    cataract surgery with lens implant  . INNER EAR SURGERY Left    x 3  . JOINT REPLACEMENT Right   . LAPAROSCOPIC SALPINGO OOPHERECTOMY    . NM MYOVIEW LTD  11/30/2007   normal stress  nuclear study/EF-83%  . TONSILLECTOMY    . TOTAL HIP ARTHROPLASTY Right 06/26/2015   Procedure: RIGHT TOTAL HIP ARTHROPLASTY ANTERIOR APPROACH;  Surgeon: Leandrew Koyanagi, MD;  Location: [place];  Service: Orthopedics;  Laterality: Right;  . TRANSTHORACIC ECHOCARDIOGRAM  07/23/2009   mild concentric LVH/mild mitral insufficiency/ moderate tricuspid innsufficiency/mild pulmonary HTN/EF- 55-60%  . VAGINAL DELIVERY      Current Medications: Outpatient Medications Prior to Visit  Medication Sig Dispense Refill  . acetaminophen (TYLENOL) 650 MG CR tablet Take 650-1,300 mg by mouth 2 (two) times daily as needed (for arthritis pain).     [name] apixaban (ELIQUIS) 5 MG TABS tablet Take 1 tablet (5 mg total) by mouth 2 (two) times daily. 60 tablet 0  . atorvastatin (LIPITOR) 40 MG tablet Take 40 mg by mouth daily.     . Calcium Carbonate-Vitamin D3 (CALCIUM 600+D3) 600-400 MG-UNIT TABS Take 1 tablet by mouth daily.    . cholecalciferol (VITAMIN D) 1000 UNITS tablet Take 1,000 Units by mouth daily.     [name] desipramine (NORPRAMIN) 25 MG tablet Take 25 mg by mouth daily.    . famotidine (PEPCID) 20 MG tablet Take 20 mg by mouth  daily.    [name] glucosamine-chondroitin 500-400 MG tablet Take 1 tablet by mouth every other day.     . linaclotide (LINZESS) 145 MCG CAPS capsule Take 145 mcg by mouth daily before breakfast.     . LORazepam (ATIVAN) 1 MG tablet Take 1 tablet by mouth at bedtime.  5  . Multiple Vitamins-Minerals (OCUVITE PRESERVISION PO) Take 1 tablet by mouth daily.    [name] Glycol-Propyl Glycol (SYSTANE) 0.4-0.3 % GEL ophthalmic gel Place 1 application into both eyes at bedtime.     . polyethylene glycol (MIRALAX / GLYCOLAX) packet Take 17 g by mouth at bedtime. Mix in 8 oz liquid and drink    . PRESCRIPTION MEDICATION CPAP with oxygen at bedtime    . vitamin B-12 (CYANOCOBALAMIN) 1000 MCG tablet Take 1,000 mcg by mouth daily.    [name] amiodarone (PACERONE) 200 MG tablet Take 1 tablet (200 mg total) by mouth 2 (two) times daily. 180 tablet 3  . metoprolol tartrate (LOPRESSOR) 25 MG tablet Take 25 mg by mouth 2 (two) times daily.  180 tablet 3  . torsemide (DEMADEX) 10 MG tablet Take 1 tablet (10 mg total) by mouth every other day. 30 tablet 0  . KLOR-CON 10 10 MEQ tablet TAKE 2 TABLETS (20 MEQ TOTAL) BY MOUTH DAILY. 60 tablet 6   No facility-administered medications prior to visit.      Allergies:   Lasix [furosemide]; Phenobarbital; Adhesive [tape]; and Amitiza [lubiprostone]   Social History   Socioeconomic History  . Marital status: Widowed    Spouse name: Not on file  . Number of children: 1  . Years of education: 57  . Highest education level: Not on file  Occupational History  . Occupation: Retired  Scientific laboratory technician  . Financial resource strain: Not on file  . Food insecurity:    Worry: Not on file    Inability: Not on file  . Transportation needs:    Medical: Not on file    Non-medical: Not on file  Tobacco Use  . Smoking status: Never Smoker  . Smokeless tobacco: Never Used  Substance and Sexual Activity  . Alcohol use: No    Alcohol/week: 0.0 oz    Comment: rare  . Drug use: No  .  Sexual activity: Not on file  Lifestyle  . Physical activity:    Days per week: Not on file    Minutes per session: Not on file  . Stress: Not on file  Relationships  . Social connections:    Talks on phone: Not on file    Gets together: Not on file    Attends religious service: Not on file    Active member of club or organization: Not on file    Attends meetings of clubs or organizations: Not on file    Relationship status: Not on file  Other Topics Concern  . Not on file  Social History Narrative   Patient lives alone.    Patient is widowed.    Patient retired.    Patient some college.    Patient has one child.    Caffeine 1-2 cups daily avg.     Family History:  The patient's family history includes Cancer in her mother; Diabetes in her sister; Heart attack in her father; Heart failure in her mother and sister; Hypertension in her sister.   ROS:   Please see the history of present illness.    ROS All other systems reviewed and are negative.   PHYSICAL EXAM:   VS:  BP (!) 162/76   Pulse (!) 55   Ht 5' (1.524 m)   Wt 171 lb 12.8 oz (77.9 kg)   BMI 33.55 kg/m    GEN: Well nourished, well developed, in no acute distress  HEENT: normal  Neck: no JVD, carotid bruits, or masses Cardiac: RRR; no murmurs, rubs, or gallops. L> R nonpitting edema  Respiratory:  clear to auscultation bilaterally, normal work of breathing GI: soft, nontender, nondistended, + BS MS: no deformity or atrophy  Skin: warm and dry, no rash Neuro:  Alert and Oriented x 3, Strength and sensation are intact Psych: euthymic mood, full affect  Wt Readings from Last 3 Encounters:  05/31/17 171 lb 12.8 oz (77.9 kg)  04/29/17 167 lb (75.8 kg)  04/20/17 171 lb 6.4 oz (77.7 kg)      Studies/Labs Reviewed:   EKG:  EKG is ordered today.  The ekg ordered today demonstrates sinus bradycardia, heart rate 55.  Recent Labs: 11/30/2016: Magnesium 2.1 03/23/2017: ALT 19; TSH 3.334 04/05/2017: BUN 30;  Creatinine, Ser 1.63; Hemoglobin 13.3; Platelets 307; Potassium 4.7; Sodium 138   Lipid Panel    Component Value Date/Time   CHOL 153 02/23/2017 0807   TRIG 137 02/23/2017 0807   HDL 62 02/23/2017 0807   CHOLHDL 2.5 02/23/2017 0807   VLDL 27 02/23/2017 0807   LDLCALC 64 02/23/2017 0807    Additional studies/ records that were reviewed today include:   Echo 02/24/2017 LV EF: 45% -   50%  Study Conclusions  - Left ventricle: The cavity size was normal. Systolic function was   mildly reduced. The estimated ejection fraction was in the range   of 45% to 50%. Diffuse hypokinesis worse in the septal   myocardium. - Aortic valve: There was no regurgitation. - Mitral valve: Mildly calcified annulus. There was mild   regurgitation. - Left atrium: The atrium was moderately dilated. - Right ventricle: The cavity size was normal. Wall thickness was   normal. Systolic function was normal. - Right atrium: The atrium was severely dilated. - Atrial septum: No defect or patent foramen ovale was identified. - Tricuspid valve: There was mild regurgitation. - Pulmonary arteries: Systolic pressure was within the normal   range. PA peak pressure: 32 mm Hg (S). -  Pericardium, extracardiac: A small pericardial effusion was   identified. There was evidence for increased RV-LV interaction   demonstrated by respirophasic changes in transmitral velocities   and tricuspid velocities.  Impressions: - Variation was noted in the mitral and tricuspid valve inflow.   This is likely due to atrial fibrillation rather than tamponade.   IVC collapses normally.    ASSESSMENT:    1. Dyspnea, unspecified type   2. Chronic diastolic heart failure ([place])   3. Bradycardia   4. Atypical atrial flutter ([place])   5. Essential hypertension   6. Hyperlipidemia, unspecified hyperlipidemia type   7. Controlled type 2 diabetes mellitus without complication, without long-term current use of insulin ([place])   8.  OSA on CPAP   9. Pulmonary hypertension, unspecified ([place])      PLAN:  In order of problems listed above:  1. Dyspnea: I think most likely cause is multifactorial given her obesity, history of pulmonary hypertension and bradycardia.  Despite the recent instruction to decrease her metoprolol, she has not done so, her heart rate with ambulation was in the 40s without significant increase despite exertion.  I decreased her metoprolol to 12.5 mg twice daily.  I also increased her torsemide to 20 mg daily and also doubled her potassium supplement as well given bilateral ankle edema.  She will need to monitor her renal function very closely as she already had an episode of dehydration back in January. 2. Chronic diastolic heart failure: Despite our previous records indicate she should be on torsemide 10 mg every other day, however according to the patient, she is actually taking 10 mg daily.  She has bilateral ankle edema, most of which are nonpitting in nature.  I did increase her torsemide to 20 mg daily along with potassium supplement as well.  She will need basic metabolic panel in 1 week, if renal function worsens, I will scale torsemide back.  3. PAF: Has recurrent atrial fibrillation and atypical atrial flutter, followed by Dr. Lovena Le.  Currently on 200 mg daily of amiodarone.  Her heart rate has been in the high 40s to low 50s without significant increase despite ambulation, I will decrease metoprolol to 12.5 mg twice daily.  Continue Eliquis  4. Hypertension: Blood pressure elevated today, adding amlodipine 5 mg daily.  Decrease metoprolol to 12.5 mg twice daily due to bradycardia  5. Hyperlipidemia: On Lipitor 40 mg daily.  Last lipid panel obtained in December 2018 showed well-controlled total cholesterol, triglyceride, HDL and LDL.  6. DM 2: Managed by primary care provider  7. Obstructive sleep apnea on CPAP: Compliant with CPAP therapy  8. Pulmonary hypertension: Seen on previous cardiac  cath and echocardiogram.  I added amlodipine for her blood pressure today, this is also for her pulmonary hypertension as well.  May also consider nifedipine as well on follow-up if amlodipine does not control her blood pressure very well.  Nifedipine probably has better effect in people with pulmonary hypertension.    Medication Adjustments/Labs and Tests Ordered: Current medicines are reviewed at length with the patient today.  Concerns regarding medicines are outlined above.  Medication changes, Labs and Tests ordered today are listed in the Patient Instructions below. Patient Instructions  Medication Instructions:  START Norvasc 5mg  Take 1 tablet once a day DECREASE Metoprolol to 12.5mg  Take 1 tablet twice a day INCREASE Torsemide to 20mg  take 1 tablet once a day INCREASE Potassium to 59meq Take 2 tablets twice a day   Labwork: Your physician recommends that  you return for lab work in: 1 week: BMET  Testing/Procedures: None   Follow-Up: Keep upcoming appointment as scheduled  Any Other Special Instructions Will Be Listed Below (If Applicable). If you need a refill on your cardiac medications before your next appointment, please call your pharmacy.     Hilbert Corrigan, [place]  05/31/2017 1:45 PM    [facility] [place], [place], [place]  [address] Phone: [phone]; Fax: [phone]

## 2017-06-01 DIAGNOSIS — D126 Benign neoplasm of colon, unspecified: Secondary | ICD-10-CM | POA: Diagnosis not present

## 2017-06-01 DIAGNOSIS — I5043 Acute on chronic combined systolic (congestive) and diastolic (congestive) heart failure: Secondary | ICD-10-CM | POA: Diagnosis not present

## 2017-06-01 DIAGNOSIS — R198 Other specified symptoms and signs involving the digestive system and abdomen: Secondary | ICD-10-CM | POA: Diagnosis not present

## 2017-06-01 DIAGNOSIS — I480 Paroxysmal atrial fibrillation: Secondary | ICD-10-CM | POA: Diagnosis not present

## 2017-06-01 DIAGNOSIS — G4733 Obstructive sleep apnea (adult) (pediatric): Secondary | ICD-10-CM | POA: Diagnosis not present

## 2017-06-01 DIAGNOSIS — M75101 Unspecified rotator cuff tear or rupture of right shoulder, not specified as traumatic: Secondary | ICD-10-CM | POA: Diagnosis not present

## 2017-06-01 DIAGNOSIS — I2510 Atherosclerotic heart disease of native coronary artery without angina pectoris: Secondary | ICD-10-CM | POA: Diagnosis not present

## 2017-06-01 DIAGNOSIS — J9601 Acute respiratory failure with hypoxia: Secondary | ICD-10-CM | POA: Diagnosis not present

## 2017-06-01 DIAGNOSIS — J189 Pneumonia, unspecified organism: Secondary | ICD-10-CM | POA: Diagnosis not present

## 2017-06-01 DIAGNOSIS — M8580 Other specified disorders of bone density and structure, unspecified site: Secondary | ICD-10-CM | POA: Diagnosis not present

## 2017-06-01 DIAGNOSIS — H3530 Unspecified macular degeneration: Secondary | ICD-10-CM | POA: Diagnosis not present

## 2017-06-01 DIAGNOSIS — R0902 Hypoxemia: Secondary | ICD-10-CM | POA: Diagnosis not present

## 2017-06-01 DIAGNOSIS — Z0000 Encounter for general adult medical examination without abnormal findings: Secondary | ICD-10-CM | POA: Diagnosis not present

## 2017-06-03 DIAGNOSIS — Z1212 Encounter for screening for malignant neoplasm of rectum: Secondary | ICD-10-CM | POA: Diagnosis not present

## 2017-06-06 DIAGNOSIS — I5032 Chronic diastolic (congestive) heart failure: Secondary | ICD-10-CM | POA: Diagnosis not present

## 2017-06-06 LAB — BASIC METABOLIC PANEL
BUN/Creatinine Ratio: 19 (ref 12–28)
BUN: 22 mg/dL (ref 8–27)
CALCIUM: 9.6 mg/dL (ref 8.7–10.3)
CHLORIDE: 97 mmol/L (ref 96–106)
CO2: 28 mmol/L (ref 20–29)
Creatinine, Ser: 1.15 mg/dL — ABNORMAL HIGH (ref 0.57–1.00)
GFR calc non Af Amer: 44 mL/min/{1.73_m2} — ABNORMAL LOW (ref >59)
GFR, EST AFRICAN AMERICAN: 51 mL/min/{1.73_m2} — AB (ref >59)
Glucose: 166 mg/dL — ABNORMAL HIGH (ref 65–99)
POTASSIUM: 4 mmol/L (ref 3.5–5.2)
Sodium: 139 mmol/L (ref 134–144)

## 2017-06-13 ENCOUNTER — Telehealth: Payer: Self-pay

## 2017-06-13 NOTE — Telephone Encounter (Signed)
Opened in error

## 2017-06-15 DIAGNOSIS — H353211 Exudative age-related macular degeneration, right eye, with active choroidal neovascularization: Secondary | ICD-10-CM | POA: Diagnosis not present

## 2017-06-23 ENCOUNTER — Encounter (INDEPENDENT_AMBULATORY_CARE_PROVIDER_SITE_OTHER): Payer: Self-pay | Admitting: Orthopaedic Surgery

## 2017-06-23 ENCOUNTER — Ambulatory Visit (INDEPENDENT_AMBULATORY_CARE_PROVIDER_SITE_OTHER): Payer: Medicare HMO

## 2017-06-23 ENCOUNTER — Ambulatory Visit (INDEPENDENT_AMBULATORY_CARE_PROVIDER_SITE_OTHER): Payer: Medicare HMO | Admitting: Orthopaedic Surgery

## 2017-06-23 DIAGNOSIS — Z96641 Presence of right artificial hip joint: Secondary | ICD-10-CM | POA: Diagnosis not present

## 2017-06-23 DIAGNOSIS — R69 Illness, unspecified: Secondary | ICD-10-CM | POA: Diagnosis not present

## 2017-06-23 NOTE — Progress Notes (Signed)
Office Visit Note   Patient: Melinda [name]           Date of Birth: 1932/01/18           MRN: [id number] Visit Date: 06/23/2017              Requested by: Crist Infante, MD [address] [place], [place] [address] PCP: Crist Infante, MD   Assessment & Plan: Visit Diagnoses:  1. Status post right hip replacement     Plan: 2 year THA follow up plan  Patient is now two years out from a total hip arthroplasty. Patient is doing very well and very pleased with the results. Radiographs reveal a total hip arthroplasty in good position, with no evidence of subsidence, loosening, or complicating features. It was reinforced that prophylactic antibiotics should be taken with any procedure including but not limited to dental work or colonoscopies. We plan to follow them at 1 year intervals at this time with radiographs at each visit, and as always we should be notified with any questions or concerns in the interim.   Follow-Up Instructions: Return in about 1 year (around 06/24/2018).   Orders:  Orders Placed This Encounter  Procedures  . XR HIP UNILAT W OR W/O PELVIS 2-3 VIEWS RIGHT   No orders of the defined types were placed in this encounter.     Procedures: No procedures performed   Clinical Data: No additional findings.   Subjective: Chief Complaint  Patient presents with  . Right Hip - Pain, Follow-up    Melinda [name] is 2 years status post right total hip replacement.  She comes in today for her regular visit.  She is doing well and reports no pain.  Her groin pain has completely resolved.   Review of Systems   Objective: Vital Signs: There were no vitals taken for this visit.  Physical Exam  Ortho Exam Right hip exam shows a fully healed surgical scar.  She has painless rotation of her hip.  She walks well without any assistive devices.  She has no pain with resisted straight leg or hip flexion. Specialty Comments:  No specialty comments  available.  Imaging: Xr Hip Unilat W Or W/o Pelvis 2-3 Views Right  Result Date: 06/23/2017 Stable total hip replacement without complication    PMFS History: Patient Active Problem List   Diagnosis Date Noted  . Shock ([place]) 03/23/2017  . Atrial flutter ([place]) 03/22/2017  . Uncontrolled atrial fibrillation ([place]) 02/24/2017  . GERD (gastroesophageal reflux disease) 02/23/2017  . Diabetes ([place]) 02/23/2017  . Atypical atrial flutter ([place])   . Prolonged Q-T interval on ECG   . Pain in right hip 06/22/2016  . Encounter for monitoring sotalol therapy 05/31/2016  . Chest pain   . CHF exacerbation ([place]) 02/29/2016  . Acute respiratory failure with hypoxia ([place]) 02/29/2016  . Persistent atrial fibrillation ([place])   . Hypertensive heart disease   . Hypercholesterolemia   . Pulmonary hypertension ([place])   . Dyspnea on exertion   . Non-obstructive CAD   . Chest pain, moderate coronary artery risk 07/14/2015  . Pain in the chest   . PAF (paroxysmal atrial fibrillation) (HCC)   . Pulmonary arterial hypertension ([place])   . Chest pain with moderate risk for cardiac etiology 07/08/2015  . Dyspnea 07/08/2015  . Essential hypertension 07/08/2015  . Hyperlipidemia 07/08/2015  . Osteoarthritis of right hip 06/26/2015  . Hip joint replacement status 06/26/2015  . OSA on CPAP 03/27/2015  . Ataxia 03/27/2015  .  Falls 03/27/2015  . Insomnia, controlled 03/27/2015  . Insomnia with sleep apnea 03/22/2014  . Adjustment disorder with mixed anxiety and depressed mood 03/22/2014  . Sleep apnea with use of continuous positive airway pressure (CPAP) 10/19/2012  . Multifactorial gait disorder    Past Medical History:  Diagnosis Date  . A-fib ([place])   . Acid reflux   . Anxiety   . Arthritis   . Cataract    surgery,bilateral  . Constipation   . Depression   . Diabetes mellitus without complication (HCC)    Pre diabetes  . Dyspnea on exertion    a. 07/2015 Echo: EF 55-60%, no rwma, mild MR, mod  dil LA/RA/RV, mod TR, triv PR, PASP 85mmHg.  . Falls   . Family history of adverse reaction to anesthesia    mother always had n/v  . Hx: UTI (urinary tract infection)   . Hypercholesterolemia   . Hypertensive heart disease   . Insomnia   . Multifactorial gait disorder   . Non-obstructive CAD    a. 07/2015 Cath: LM 30-40d, LAD min irregs, LCX min irregs, RCA 30p, RA 17, RV 33/7, PA 42/19, PCWP 17.  . On home oxygen therapy    "2L w/CPAP at night" (02/24/2017)  . Osteoarthritis    a. 06/2015 s/p R total hip arthroplasty.  [name] PAF (paroxysmal atrial fibrillation) ([place])    a. Dx 07/2015, CHA2DS2VASc = 6-->Eliquis.  . Premature atrial contractions   . Pulmonary hypertension ([place])    a. 07/2015 Echo: PASP 11mmHg.  [name] Pyloric antral stenosis    in childhood,infancy  . Sleep apnea with use of continuous positive airway pressure (CPAP) 10/19/2012   a. compliant w/ CPAP.    Family History  Problem Relation Age of Onset  . Heart attack Father   . Heart failure Mother   . Cancer Mother        colon,kidney  . Diabetes Sister   . Hypertension Sister   . Heart failure Sister     Past Surgical History:  Procedure Laterality Date  . ABDOMINAL HYSTERECTOMY  1970  . ABDOMINAL SURGERY     as a 47 day old baby  . BREAST BIOPSY Left 2017  . CARDIAC CATHETERIZATION N/A 07/14/2015   Procedure: Right/Left Heart Cath and Coronary Angiography;  Surgeon: Sanda Klein, MD;  Location: [facility];  Service: Cardiovascular;  Laterality: N/A;  . CARDIOVERSION N/A 11/04/2015   Procedure: CARDIOVERSION;  Surgeon: Lelon Perla, MD;  Location: [facility] ENDOSCOPY;  Service: Cardiovascular;  Laterality: N/A;  . CARDIOVERSION N/A 02/25/2016   Procedure: CARDIOVERSION;  Surgeon: Pixie Casino, MD;  Location: [facility] ENDOSCOPY;  Service: Cardiovascular;  Laterality: N/A;  . CARDIOVERSION N/A 12/13/2016   Procedure: CARDIOVERSION;  Surgeon: Fay Records, MD;  Location: [facility] ENDOSCOPY;  Service: Cardiovascular;   Laterality: N/A;  . CARDIOVERSION N/A 03/23/2017   Procedure: CARDIOVERSION;  Surgeon: Sueanne Margarita, MD;  Location: [facility] ENDOSCOPY;  Service: Cardiovascular;  Laterality: N/A;  . COLONOSCOPY    . EYE SURGERY Bilateral    cataract surgery with lens implant  . INNER EAR SURGERY Left    x 3  . JOINT REPLACEMENT Right   . LAPAROSCOPIC SALPINGO OOPHERECTOMY    . NM MYOVIEW LTD  11/30/2007   normal stress nuclear study/EF-83%  . TONSILLECTOMY    . TOTAL HIP ARTHROPLASTY Right 06/26/2015   Procedure: RIGHT TOTAL HIP ARTHROPLASTY ANTERIOR APPROACH;  Surgeon: Leandrew Koyanagi, MD;  Location: [facility];  Service: Orthopedics;  Laterality: Right;  . TRANSTHORACIC ECHOCARDIOGRAM  07/23/2009   mild concentric LVH/mild mitral insufficiency/ moderate tricuspid innsufficiency/mild pulmonary HTN/EF- 55-60%  . VAGINAL DELIVERY     Social History   Occupational History  . Occupation: Retired  Tobacco Use  . Smoking status: Never Smoker  . Smokeless tobacco: Never Used  Substance and Sexual Activity  . Alcohol use: No    Alcohol/week: 0.0 oz    Comment: rare  . Drug use: No  . Sexual activity: Not on file

## 2017-07-02 DIAGNOSIS — G4733 Obstructive sleep apnea (adult) (pediatric): Secondary | ICD-10-CM | POA: Diagnosis not present

## 2017-07-02 DIAGNOSIS — R0902 Hypoxemia: Secondary | ICD-10-CM | POA: Diagnosis not present

## 2017-07-02 DIAGNOSIS — I5043 Acute on chronic combined systolic (congestive) and diastolic (congestive) heart failure: Secondary | ICD-10-CM | POA: Diagnosis not present

## 2017-07-10 NOTE — Progress Notes (Signed)
Cardiology Office Note    Date:  07/13/2017   ID:  Melinda [name], DOB 1931-08-30, MRN [id number]  PCP:  Crist Infante, MD  Cardiologist:  Dr. Martinique  Chief Complaint  Patient presents with  . Follow-up    SOB  . Edema    History of Present Illness:  Melinda [name] is a 84 y.o. female with PMH of DM II, HTN, HLD, OSA on CPAP followed by Dr. Brett Fairy, pulmonary HTN, CAD, recurrent PAF and atypical atrial flutter.  She was seen in March 2017 for increased dyspnea on exertion.  Repeat echocardiogram and Myoview were scheduled, however she ended up admitted to the hospital was acute chest pain prior to those test being down.  She was found to be in atrial flutter with controlled heart rate.  Echocardiogram at the time showed normal LV function, mild LVH, mild MR, moderate LAE, RA, RV enlargement with pulmonary hypertension estimated at 50 mmHg.  She underwent left and right heart cath that showed nonobstructive CAD, mild pulmonary hypertension more consistent with pulmonary arterial hypertension.  CT angiogram of the chest showed no evidence of PE.  Subsequent PFTs as outpatient showed normal volume with mild obstruction and a decreased diffusion capacity.  She was started on diuretic therapy and systemic anticoagulation.  She underwent successful DCCV in August 2017.  By November 2017, she had a recurrent atrial fibrillation, beta-blocker was increased.  She again underwent DCCV in September 2017.  She was admitted 4 days after that for acute respiratory distress.  Questionable left lower lobe pneumonia, treated with antibiotic and gentle diuresis.  Echocardiogram showed normal LV function severe biatrial enlargement, pulmonary hypertension.  She had a recurrent atrial fibrillation in January 2018.  She was not a candidate for Tikosyn due to cost.  She was placed on sotalol 160mg  BID in March 2018.  In September 2018, she had symptoms of recurrent tachycardia and fatigue and was found to get  atrial flutter versus atrial tachycardia.  Beta-blocker was increased with improvement and she eventually underwent successful DC cardioversion in October 2018.  She had a recurrent atypical atrial flutter with 2-1 AV conduction in December 2018 and was seen by Dr. Lovena Le. Sotalol was discontinued and she was started on amiodarone.  She was admitted in January 2019 for chest pain and ruled out of MI, she underwent DC cardioversion for atrial flutter successfully.  Post cardioversion, she had bradycardia and profound hypotension requiring pressors.  Metoprolol was held and it was eventually resumed at a low dose.  She was last seen by Dr. Martinique on 04/20/2017, she did have some significant memory issues for 2 weeks following her DC cardioversion.  Based on recent telephone note on 05/22/2017, she was complaining of some bradycardia and the dyspnea with exertion.  Her metoprolol was reduced from 25 mg twice daily down to 12.5 mg twice daily.  She was instructed to document her weights.  On follow up today she is still not doing well. Notes SOB with exertion or bending over. Has a chronic dry cough. Is very fatigued. Feels drunk and staggers at times. No increase swelling. Weight is down. Home records reveal good BP control but HR staying in the 40s. Range 37-57. Oxygen levels 93-98%.    Past Medical History:  Diagnosis Date  . A-fib ([place])   . Acid reflux   . Anxiety   . Arthritis   . Cataract    surgery,bilateral  . Constipation   . Depression   . Diabetes  mellitus without complication ([employer])    Pre diabetes  . Dyspnea on exertion    a. 07/2015 Echo: EF 55-60%, no rwma, mild MR, mod dil LA/RA/RV, mod TR, triv PR, PASP 54mmHg.  . Falls   . Family history of adverse reaction to anesthesia    mother always had n/v  . Hx: UTI (urinary tract infection)   . Hypercholesterolemia   . Hypertensive heart disease   . Insomnia   . Multifactorial gait disorder   . Non-obstructive CAD    a. 07/2015 Cath: LM  30-40d, LAD min irregs, LCX min irregs, RCA 30p, RA 17, RV 33/7, PA 42/19, PCWP 17.  . On home oxygen therapy    "2L w/CPAP at night" (02/24/2017)  . Osteoarthritis    a. 06/2015 s/p R total hip arthroplasty.  [name] PAF (paroxysmal atrial fibrillation) ([place])    a. Dx 07/2015, CHA2DS2VASc = 6-->Eliquis.  . Premature atrial contractions   . Pulmonary hypertension ([place])    a. 07/2015 Echo: PASP 39mmHg.  [name] Pyloric antral stenosis    in childhood,infancy  . Sleep apnea with use of continuous positive airway pressure (CPAP) 10/19/2012   a. compliant w/ CPAP.    Past Surgical History:  Procedure Laterality Date  . ABDOMINAL HYSTERECTOMY  1970  . ABDOMINAL SURGERY     as a 4 day old baby  . BREAST BIOPSY Left 2017  . CARDIAC CATHETERIZATION N/A 07/14/2015   Procedure: Right/Left Heart Cath and Coronary Angiography;  Surgeon: Sanda Klein, MD;  Location: [facility];  Service: Cardiovascular;  Laterality: N/A;  . CARDIOVERSION N/A 11/04/2015   Procedure: CARDIOVERSION;  Surgeon: Lelon Perla, MD;  Location: [facility] ENDOSCOPY;  Service: Cardiovascular;  Laterality: N/A;  . CARDIOVERSION N/A 02/25/2016   Procedure: CARDIOVERSION;  Surgeon: Pixie Casino, MD;  Location: [facility] ENDOSCOPY;  Service: Cardiovascular;  Laterality: N/A;  . CARDIOVERSION N/A 12/13/2016   Procedure: CARDIOVERSION;  Surgeon: Fay Records, MD;  Location: [facility] ENDOSCOPY;  Service: Cardiovascular;  Laterality: N/A;  . CARDIOVERSION N/A 03/23/2017   Procedure: CARDIOVERSION;  Surgeon: Sueanne Margarita, MD;  Location: [facility] ENDOSCOPY;  Service: Cardiovascular;  Laterality: N/A;  . COLONOSCOPY    . EYE SURGERY Bilateral    cataract surgery with lens implant  . INNER EAR SURGERY Left    x 3  . JOINT REPLACEMENT Right   . LAPAROSCOPIC SALPINGO OOPHERECTOMY    . NM MYOVIEW LTD  11/30/2007   normal stress nuclear study/EF-83%  . TONSILLECTOMY    . TOTAL HIP ARTHROPLASTY Right 06/26/2015   Procedure: RIGHT TOTAL HIP ARTHROPLASTY  ANTERIOR APPROACH;  Surgeon: Leandrew Koyanagi, MD;  Location: [facility];  Service: Orthopedics;  Laterality: Right;  . TRANSTHORACIC ECHOCARDIOGRAM  07/23/2009   mild concentric LVH/mild mitral insufficiency/ moderate tricuspid innsufficiency/mild pulmonary HTN/EF- 55-60%  . VAGINAL DELIVERY      Current Medications: Outpatient Medications Prior to Visit  Medication Sig Dispense Refill  . acetaminophen (TYLENOL) 650 MG CR tablet Take 650-1,300 mg by mouth 2 (two) times daily as needed (for arthritis pain).     [name] amiodarone (PACERONE) 200 MG tablet Take 1 tablet (200 mg total) by mouth daily. 180 tablet 3  . apixaban (ELIQUIS) 5 MG TABS tablet Take 1 tablet (5 mg total) by mouth 2 (two) times daily. 60 tablet 0  . atorvastatin (LIPITOR) 40 MG tablet Take 40 mg by mouth daily.     . Calcium Carbonate-Vitamin D3 (CALCIUM 600+D3) 600-400 MG-UNIT TABS Take 1 tablet  by mouth daily.    . cholecalciferol (VITAMIN D) 1000 UNITS tablet Take 1,000 Units by mouth daily.     [name] desipramine (NORPRAMIN) 25 MG tablet Take 25 mg by mouth daily.    . famotidine (PEPCID) 20 MG tablet Take 20 mg by mouth daily.    [name] glucosamine-chondroitin 500-400 MG tablet Take 1 tablet by mouth every other day.     . linaclotide (LINZESS) 145 MCG CAPS capsule Take 145 mcg by mouth daily before breakfast.     . LORazepam (ATIVAN) 1 MG tablet Take 2 tablets by mouth at bedtime.   5  . Multiple Vitamins-Minerals (OCUVITE PRESERVISION PO) Take 1 tablet by mouth daily.    [name] Glycol-Propyl Glycol (SYSTANE) 0.4-0.3 % GEL ophthalmic gel Place 1 application into both eyes at bedtime.     . polyethylene glycol (MIRALAX / GLYCOLAX) packet Take 17 g by mouth at bedtime. Mix in 8 oz liquid and drink    . PRESCRIPTION MEDICATION CPAP with oxygen at bedtime    . torsemide (DEMADEX) 10 MG tablet Take 2 tablets (20 mg total) by mouth daily. (Patient taking differently: Take 10 mg by mouth daily. ) 60 tablet 3  . vitamin B-12 (CYANOCOBALAMIN)  1000 MCG tablet Take 1,000 mcg by mouth daily.    . metoprolol tartrate (LOPRESSOR) 25 MG tablet Take 0.5 tablets (12.5 mg total) by mouth 2 (two) times daily. 30 tablet 3  . potassium chloride (KLOR-CON 10) 10 MEQ tablet Take 2 tablets (20 mEq total) by mouth 2 (two) times daily. (Patient taking differently: Take 10 mEq by mouth 2 (two) times daily. ) 120 tablet 3  . amLODipine (NORVASC) 5 MG tablet Take 1 tablet (5 mg total) by mouth daily. 30 tablet 3   No facility-administered medications prior to visit.      Allergies:   Lasix [furosemide]; Phenobarbital; Adhesive [tape]; and Amitiza [lubiprostone]   Social History   Socioeconomic History  . Marital status: Widowed    Spouse name: Not on file  . Number of children: 1  . Years of education: 54  . Highest education level: Not on file  Occupational History  . Occupation: Retired  Scientific laboratory technician  . Financial resource strain: Not on file  . Food insecurity:    Worry: Not on file    Inability: Not on file  . Transportation needs:    Medical: Not on file    Non-medical: Not on file  Tobacco Use  . Smoking status: Never Smoker  . Smokeless tobacco: Never Used  Substance and Sexual Activity  . Alcohol use: No    Alcohol/week: 0.0 oz    Comment: rare  . Drug use: No  . Sexual activity: Not on file  Lifestyle  . Physical activity:    Days per week: Not on file    Minutes per session: Not on file  . Stress: Not on file  Relationships  . Social connections:    Talks on phone: Not on file    Gets together: Not on file    Attends religious service: Not on file    Active member of club or organization: Not on file    Attends meetings of clubs or organizations: Not on file    Relationship status: Not on file  Other Topics Concern  . Not on file  Social History Narrative   Patient lives alone.    Patient is widowed.    Patient retired.    Patient some college.  Patient has one child.    Caffeine 1-2 cups daily avg.      Family History:  The patient's family history includes Cancer in her mother; Diabetes in her sister; Heart attack in her father; Heart failure in her mother and sister; Hypertension in her sister.   ROS:   Please see the history of present illness.    ROS All other systems reviewed and are negative.   PHYSICAL EXAM:   VS:  BP (!) 104/48 (BP Location: Left Arm, Patient Position: Sitting, Cuff Size: Normal)   Pulse (!) 45   Ht 5' (1.524 m)   Wt 165 lb (74.8 kg)   BMI 32.22 kg/m    GEN: Well nourished, overweight, in no acute distress  HEENT: normal  Neck: no JVD, carotid bruits, or masses Cardiac: RRR- bradycardic; no murmurs, rubs, or gallops. No edema. Support hose in place Respiratory:  clear to auscultation bilaterally, normal work of breathing GI: soft, nontender, nondistended, + BS MS: no deformity or atrophy  Skin: warm and dry, no rash Neuro:  Alert and Oriented x 3, Strength and sensation are intact Psych: euthymic mood, full affect  Wt Readings from Last 3 Encounters:  07/13/17 165 lb (74.8 kg)  05/31/17 171 lb 12.8 oz (77.9 kg)  04/29/17 167 lb (75.8 kg)      Studies/Labs Reviewed:   EKG:  EKG is not ordered today.    Recent Labs: 11/30/2016: Magnesium 2.1 03/23/2017: ALT 19; TSH 3.334 04/05/2017: Hemoglobin 13.3; Platelets 307 06/06/2017: BUN 22; Creatinine, Ser 1.15; Potassium 4.0; Sodium 139   Lipid Panel    Component Value Date/Time   CHOL 153 02/23/2017 0807   TRIG 137 02/23/2017 0807   HDL 62 02/23/2017 0807   CHOLHDL 2.5 02/23/2017 0807   VLDL 27 02/23/2017 0807   LDLCALC 64 02/23/2017 0807   Dated 05/25/17: cholesterol 107, triglycerides 76, HDL 45, LDL 47. A1c 6.3%. Hgb 12.4. LFTs and TSH normal   Additional studies/ records that were reviewed today include:   Echo 02/24/2017 LV EF: 45% -   50%  Study Conclusions  - Left ventricle: The cavity size was normal. Systolic function was   mildly reduced. The estimated ejection fraction was in  the range   of 45% to 50%. Diffuse hypokinesis worse in the septal   myocardium. - Aortic valve: There was no regurgitation. - Mitral valve: Mildly calcified annulus. There was mild   regurgitation. - Left atrium: The atrium was moderately dilated. - Right ventricle: The cavity size was normal. Wall thickness was   normal. Systolic function was normal. - Right atrium: The atrium was severely dilated. - Atrial septum: No defect or patent foramen ovale was identified. - Tricuspid valve: There was mild regurgitation. - Pulmonary arteries: Systolic pressure was within the normal   range. PA peak pressure: 32 mm Hg (S). - Pericardium, extracardiac: A small pericardial effusion was   identified. There was evidence for increased RV-LV interaction   demonstrated by respirophasic changes in transmitral velocities   and tricuspid velocities.  Impressions: - Variation was noted in the mitral and tricuspid valve inflow.   This is likely due to atrial fibrillation rather than tamponade.   IVC collapses normally.    ASSESSMENT:    1. Symptomatic sinus bradycardia   2. Atypical atrial flutter (HCC)   3. Paroxysmal atrial fibrillation ([place])   4. Pulmonary hypertension, unspecified ([place])   5. OSA on CPAP   6. Chronic diastolic heart failure ([place])  PLAN:  In order of problems listed above:  1. Dyspnea: multifactorial related to obesity,  pulmonary hypertension and bradycardia. No improvement with increase in diuretic last visit and decrease in edema and weight.  I am concerned that she has symptomatic bradycardia and chronotropic incompetence. I have stopped metoprolol altogether. HR does not increase with activity. Follow up with Dr. Lovena Le and if HR no better may need to consider a pacemaker.  2. Chronic diastolic heart failure: clinically volume status looks good.   3. PAF: History of recurrent atrial fibrillation and atypical atrial flutter. Now on amiodarone without recurrence  since February. Persistent bradycardia as noted above. Will stop metoprolol. Not sure there is any option but amiodarone. If HR does not improve would consider her for PPM.   Her heart rate has been in the high 40s to low 50s without significant increase despite ambulation, continue Eliquis.  4. Hypertension: Blood pressure is well controlled.  5. Hyperlipidemia: On Lipitor 40 mg daily.  Last lipid panel obtained in December 2018 showed well-controlled total cholesterol, triglyceride, HDL and LDL.  6. DM 2: Managed by primary care provider  7. Obstructive sleep apnea on CPAP: Compliant with CPAP therapy  8. Pulmonary hypertension: Mild by cardiac cath. I doubt this is the primary issue with her dyspnea.     Medication Adjustments/Labs and Tests Ordered: Current medicines are reviewed at length with the patient today.  Concerns regarding medicines are outlined above.  Medication changes, Labs and Tests ordered today are listed in the Patient Instructions below. Patient Instructions  Stop taking metoprolol  Continue your other medication  Keep follow up with Dr. Lovena Le in 2 weeks.        Signed, Laretha Luepke Martinique, MD  07/13/2017 1:56 PM    [facility] [address] [place], [place], [place]  [address] Phone: [phone]; Fax: [phone]

## 2017-07-13 ENCOUNTER — Encounter: Payer: Self-pay | Admitting: Cardiology

## 2017-07-13 ENCOUNTER — Ambulatory Visit: Payer: Medicare HMO | Admitting: Cardiology

## 2017-07-13 VITALS — BP 104/48 | HR 45 | Ht 60.0 in | Wt 165.0 lb

## 2017-07-13 DIAGNOSIS — I5032 Chronic diastolic (congestive) heart failure: Secondary | ICD-10-CM | POA: Diagnosis not present

## 2017-07-13 DIAGNOSIS — I480 Paroxysmal atrial fibrillation: Secondary | ICD-10-CM | POA: Diagnosis not present

## 2017-07-13 DIAGNOSIS — R001 Bradycardia, unspecified: Secondary | ICD-10-CM

## 2017-07-13 DIAGNOSIS — I484 Atypical atrial flutter: Secondary | ICD-10-CM | POA: Diagnosis not present

## 2017-07-13 DIAGNOSIS — I272 Pulmonary hypertension, unspecified: Secondary | ICD-10-CM | POA: Diagnosis not present

## 2017-07-13 DIAGNOSIS — Z9989 Dependence on other enabling machines and devices: Secondary | ICD-10-CM

## 2017-07-13 DIAGNOSIS — G4733 Obstructive sleep apnea (adult) (pediatric): Secondary | ICD-10-CM | POA: Diagnosis not present

## 2017-07-13 NOTE — Patient Instructions (Signed)
Stop taking metoprolol  Continue your other medication  Keep follow up with Dr. Lovena Le in 2 weeks.

## 2017-07-19 ENCOUNTER — Encounter: Payer: Self-pay | Admitting: Neurology

## 2017-07-21 ENCOUNTER — Encounter: Payer: Self-pay | Admitting: Neurology

## 2017-07-21 ENCOUNTER — Ambulatory Visit: Payer: Medicare HMO | Admitting: Neurology

## 2017-07-21 VITALS — BP 105/55 | HR 96 | Ht 60.0 in | Wt 165.0 lb

## 2017-07-21 DIAGNOSIS — I5042 Chronic combined systolic (congestive) and diastolic (congestive) heart failure: Secondary | ICD-10-CM

## 2017-07-21 DIAGNOSIS — I481 Persistent atrial fibrillation: Secondary | ICD-10-CM

## 2017-07-21 DIAGNOSIS — G4731 Primary central sleep apnea: Secondary | ICD-10-CM | POA: Diagnosis not present

## 2017-07-21 DIAGNOSIS — F05 Delirium due to known physiological condition: Secondary | ICD-10-CM | POA: Diagnosis not present

## 2017-07-21 DIAGNOSIS — R69 Illness, unspecified: Secondary | ICD-10-CM | POA: Diagnosis not present

## 2017-07-21 DIAGNOSIS — I4892 Unspecified atrial flutter: Secondary | ICD-10-CM | POA: Diagnosis not present

## 2017-07-21 DIAGNOSIS — I4819 Other persistent atrial fibrillation: Secondary | ICD-10-CM

## 2017-07-21 NOTE — Progress Notes (Addendum)
[facility]  PATIENT: Melinda [name] DOB: 06-Dec-1931   REASON FOR VISIT: Followup for sleep apnea ; CPAP compliance.  CD : Melinda [name] , a 84 year old  long time established female  CPAP patient is seen today, 07-21-2017 for RV.  She has moved from [place] to the [place], now residing in [place] with 2 of her sisters live close by.  She is not as active with her social activities that she used to like, going to place and the beach is a long time away.  She had Atrial fib / flutter, failed 3 cardio versions and was in delirium - 1-14 through 03-28-2017, and remained anoth 14 days in delirium at home, barely verbal, hallucinations," picking things out of the air "   .  She has been 100% compliant CPAP user again was 9 hours and 7 minutes on average use, she is using an air sense 10 AutoSet set between 5 and 12 cmH2O was 1 EPR and a residual AHI of 6.0.  3.2 of her residual apneas are central in nature.   The 95th percentile pressure is 11.9 and she does have mild to moderate air leaks through the mask.    She endorsed 3 points on a geriatric depression score, 19 points on the Epworth Sleepiness Scale which is very high and 50 on the fatigue severity scale, which is also high, I reviewed her current medications and some of these medications are associated with excessive fatigue sleepiness and also physical soreness.  Amiodarone 200 mg take daily, she is anticoagulated on Eliquis, she takes Lipitor, vitamin D vitamin B12 daily ,  Pepcid daily 20 mg calcium and glucosamine, Linzess,  Ativan 2 tablets at bedtime multiple vitamins eyedrops, and potassium supplements.   She was started on torsemide, a stronger diuretic, at  10 mg 2 tablets by mouth daily.  She takes 1 in the morning and 1 at night, leading to nocturia-up to 3 times.  CPAP record_ She has about 7% of the recorded time and Cheyne-Stokes respirations- related to CHF ? CKD ?  She felt "wiped  out " after her heart rate slowed to the 40s. She denies prescyncope.    HISTORY OF PRESENT ILLNESS: CM, last note 28 :  Melinda [name] 84 year old female returns for followup. She patient reports that her gait problem has stabilized she's doing better had not had a fall for the months.  She occasionally can use a cane or walker on bad days but not very often.  She was finally diagnosed officially with lumbar spinal stenosis, she has lost some muscle mass all through the hamstrings and thigh, lost some core muscle strength. She had previously fallen many times onto her left knee which still gives her trouble at times   The patient has more pain in her knee when walking on tiptoes, she does not have foot drop . She says she has been told she has arthritis. She also complains of occasional burning paresthesias in the leg however this is very brief and once or twice a month. She does not wish to be placed on a medication. She remains on CPAP and she brought her machine for download. She does say that her machine is loud and old and she sometimes cuts it off due to the sound. She returns for reevaluation  03-22-14: Melinda [name] is here today for his yearly visit. She is an established CPAP user and endorsed today the Epworth sleepiness score at  4 points and fatigue severity score at 47 points.A download of her CPAP was obtained today in the office,  showing a moderate to high air leak. However her apnea and hypopnea index is still well controlled and the patient is highly compliant. She is using CPAP at 10 cm water with a residual AHI of 6.8 and an average time of daily CPAP use of 7 hours and 17 minutes. She is considered 90% compliant by CMS criteria which is sufficient. The airleak however is some quite significant. The patient states that she notices a noise from the airleak and sometimes it will wake her up her machine is an outer set of onset machine and I feel that there must be a more comfortable  mask for her to use without creating air leaks. She has used a nasal pillow since last year when I suggested the change. She notices that the airleak is worse when the head gear slips up over her scalp and once she pulls it back down the air leak stops. For the airleak seems to be episodic and not a chronic fact. The patient's medication list and allergy list was reviewed with my nurse, before the patient was back to the [place] area she resided on [place] and she have to drive about 4 hours or longer to see her local physicians at that time she used armodafinil or Nuvigil to keep herself awake and safely driving. There is no needs to refill this medication now.  03-27-15  Patient here for a CPAP compliance visit. Melinda [name] has moved to the [facility] area to be closer to her physicians and medical system.  She endorsed the geriatric depression score today at only 1., she endorsed fatigue severity at 46 points and the Epworth sleepiness score at 7 points. She assured me that she has been weak using her CPAP compliantly but the model is no longer supported by a manufacturer and I'm not longer able to get data downloads from the machine. Since I have followed Melinda [name] for about 8 years and this is the age of her current machine. To assure that she has continued apnea care, I will invite her for a re-titration to CPAP.  I would like one hour of baseline sleep to document her current level of apnea. The patient's CPAP machine also has a no longer functioning heating and humidifier system. For this reason alone should be exchanged. I will asked the CPAP to make an appointment that works for Melinda [name] schedule.  With data from the sleep study we should be able to order a new asleep machine for her covered by her current provided [facility].   I see Melinda [name] today on 07/22/2015 after a recent hip replacement surgery on the right side. After completing her rehabilitation and returning home  she suffered chest pain the very next day was brought to the local emergency room where a cardiac cause was ruled out. Pulmonary embolism was also not found. The chest and has meanwhile resolved but its cause was never identified. Her atrial fibrillation also acted out and she had frequently atrial fibrillation spells, she is not chronically in A. Fib, but paroxysmally. Based on her history of atrial fibrillation and known obstructive sleep apnea she underwent a new polysomnography on 05/08/2015 the AHI was still 17.4, supine AHI 28.9, and the REM AHI was 52.3. No periodic limb movements were noted that the patient had prolonged desaturations 240 minutes of low oxygen with the nadir being at 74%. She  was in need of a new CPAP machine ASAP alternative treatment such as dental devices or ENT surgery would not treat hypoxemia. 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 During prolonged toxemia there was no drop of muscle tone during REM sleep. On a pin has been used to treat this condition. The study was followed by a CPAP titration on 05/28/2015 9 cm water pressure seemed to work well for the patient. She still had 198 minutes of desaturations during the CPAP titration study. For this reason we will follow her with a pulse oximetry on CPAP. Her compliance data speak of 80% compliance for over 4 hours of use but she uses the machine 93% of all days. She is using AutoSet right now between 5 and 12 cm water average user time is 6 hours and 26 minutes. The residual AHI is 5.3. Please note that unknown events are 2.3 are likely related to air leaks. Air leaks very greatly between severe and minor day by day she is no longer is tired and has noted more energy and more restorative sleep on a new machine the 91st percentile pressure is 10.2 and the is no need to change the settings. However with her history of paroxysmal A. fib this should be a fingertip pulse oximetry to follow while on CPAP, she advised me that she just  did this test last Thursday night. I do not have results for it yet.    A-fib ([place])   . Acid reflux   . Anxiety   . Arthritis   . Cataract    surgery,bilateral  . Constipation   . Depression   . Diabetes mellitus without complication (HCC)    Pre diabetes  . Dyspnea on exertion    a. 07/2015 Echo: EF 55-60%, no rwma, mild MR, mod dil LA/RA/RV, mod TR, triv PR, PASP 43mmHg.  . Falls   . Family history of adverse reaction to anesthesia    mother always had n/v  . Hx: UTI (urinary tract infection)   . Hypercholesterolemia   . Hypertensive heart disease   . Insomnia   . Multifactorial gait disorder   . Non-obstructive CAD    a. 07/2015 Cath: LM 30-40d, LAD min irregs, LCX min irregs, RCA 30p, RA 17, RV 33/7, PA 42/19, PCWP 17.  . On home oxygen therapy    "2L w/CPAP at night" (02/24/2017)  . Osteoarthritis    a. 06/2015 s/p R total hip arthroplasty.  [name] PAF (paroxysmal atrial fibrillation) ([place])    a. Dx 07/2015, CHA2DS2VASc = 6-->Eliquis.  . Premature atrial contractions   . Pulmonary hypertension ([place])    a. 07/2015 Echo: PASP 23mmHg.  [name] Pyloric antral stenosis    in childhood,infancy  . Sleep apnea with use of continuous positive airway pressure (CPAP) 10/19/2012   a. compliant w/ CPAP.    ROS:   All systems reviewed and negative except as noted in the HPI.        Past Surgical History:  Procedure Laterality Date  . ABDOMINAL HYSTERECTOMY  1970  . ABDOMINAL SURGERY     as a 84 day old baby  . CARDIAC CATHETERIZATION N/A 07/14/2015   Procedure: Right/Left Heart Cath and Coronary Angiography;  Surgeon: Sanda Klein, MD;  Location: [place] [facility];  Service: Cardiovascular;  Laterality: N/A;  . CARDIOVERSION N/A 11/04/2015   Procedure: CARDIOVERSION;  Surgeon: Lelon Perla, MD;  Location: [facility] ENDOSCOPY;  Service: Cardiovascular;  Laterality: N/A;  . CARDIOVERSION N/A 02/25/2016   Procedure:  CARDIOVERSION;  Surgeon: Pixie Casino, MD;  Location: [facility] ENDOSCOPY;  Service: Cardiovascular;  Laterality: N/A;  . CARDIOVERSION N/A 12/13/2016   Procedure: CARDIOVERSION;  Surgeon: Fay Records, MD;  Location: [facility];  Service: Cardiovascular;  Laterality: N/A;  . COLONOSCOPY    . EYE SURGERY Bilateral    cataract surgery with lens implant  . INNER EAR SURGERY Left    x 3  . JOINT REPLACEMENT Right   . LAPAROSCOPIC SALPINGO OOPHERECTOMY    . NM MYOVIEW LTD  11/30/2007   normal stress nuclear study/EF-83%  . TONSILLECTOMY    . TOTAL HIP ARTHROPLASTY Right 06/26/2015   Procedure: RIGHT TOTAL HIP ARTHROPLASTY ANTERIOR APPROACH;  Surgeon: Leandrew Koyanagi, MD;  Location: [facility];  Service: Orthopedics;  Laterality: Right;  . TRANSTHORACIC ECHOCARDIOGRAM  07/23/2009   mild concentric LVH/mild mitral insufficiency/ moderate tricuspid innsufficiency/mild pulmonary HTN/EF- 55-60%  . VAGINAL DELIVERY                    REVIEW OF SYSTEMS: Full 14 system review of systems performed and notable only for those listed, all others are neg:   Hearing loss   cough, shortness of breath, palpitations.  Constipation,very easy bruising with petechiae,  Joint pain /joint swelling, walking difficulty- not longer on a walker and she resumed driving.   Depression not clinically evident 3/15 on geriatric score.  Epworth high 19/ 24  and FSS  50 points, chronic insomnia on ativan , has trouble to fall asleep- but improved on new CPAP  Usually reads while waiting to go to sleep.   ALLERGIES: Allergies  Allergen Reactions  . Lasix [Furosemide] Itching  . Phenobarbital Itching  . Adhesive [Tape] Other (See Comments)    SKIN WILL TEAR EASILY; PLEASE USE PAPER TAPE OR COBAN WRAP!!  . Amitiza [Lubiprostone] Itching    HOME MEDICATIONS: Outpatient Medications Prior to Visit  Medication Sig Dispense Refill  . acetaminophen (TYLENOL) 650 MG CR tablet Take 650-1,300 mg by mouth 2 (two) times daily as needed  (for arthritis pain).     [name] amiodarone (PACERONE) 200 MG tablet Take 1 tablet (200 mg total) by mouth daily. 180 tablet 3  . apixaban (ELIQUIS) 5 MG TABS tablet Take 1 tablet (5 mg total) by mouth 2 (two) times daily. 60 tablet 0  . atorvastatin (LIPITOR) 40 MG tablet Take 40 mg by mouth daily.     . Calcium Carbonate-Vitamin D3 (CALCIUM 600+D3) 600-400 MG-UNIT TABS Take 1 tablet by mouth daily.    . cholecalciferol (VITAMIN D) 1000 UNITS tablet Take 1,000 Units by mouth daily.     [name] desipramine (NORPRAMIN) 25 MG tablet Take 25 mg by mouth daily.    . famotidine (PEPCID) 20 MG tablet Take 20 mg by mouth daily.    [name] glucosamine-chondroitin 500-400 MG tablet Take 1 tablet by mouth every other day.     . linaclotide (LINZESS) 145 MCG CAPS capsule Take 145 mcg by mouth daily before breakfast.     . LORazepam (ATIVAN) 1 MG tablet Take 2 tablets by mouth at bedtime.   5  . Multiple Vitamins-Minerals (OCUVITE PRESERVISION PO) Take 1 tablet by mouth daily.    [name] Glycol-Propyl Glycol (SYSTANE) 0.4-0.3 % GEL ophthalmic gel Place 1 application into both eyes at bedtime.     . polyethylene glycol (MIRALAX / GLYCOLAX) packet Take 17 g by mouth at bedtime. Mix in 8 oz liquid and drink    . potassium chloride (KLOR-CON  10) 10 MEQ tablet Take 2 tablets (20 mEq total) by mouth 2 (two) times daily. (Patient taking differently: Take 10 mEq by mouth 2 (two) times daily. ) 120 tablet 3  . PRESCRIPTION MEDICATION CPAP with oxygen at bedtime    . torsemide (DEMADEX) 10 MG tablet Take 2 tablets (20 mg total) by mouth daily. (Patient taking differently: Take 10 mg by mouth daily. ) 60 tablet 3  . vitamin B-12 (CYANOCOBALAMIN) 1000 MCG tablet Take 1,000 mcg by mouth daily.     No facility-administered medications prior to visit.     PAST MEDICAL HISTORY: Past Medical History:  Diagnosis Date  . A-fib ([place])   . Acid reflux   . Anxiety   . Arthritis   . Cataract    surgery,bilateral  . Constipation   .  Depression   . Diabetes mellitus without complication (HCC)    Pre diabetes  . Dyspnea on exertion    a. 07/2015 Echo: EF 55-60%, no rwma, mild MR, mod dil LA/RA/RV, mod TR, triv PR, PASP 74mmHg.  . Falls   . Family history of adverse reaction to anesthesia    mother always had n/v  . Hx: UTI (urinary tract infection)   . Hypercholesterolemia   . Hypertensive heart disease   . Insomnia   . Multifactorial gait disorder   . Non-obstructive CAD    a. 07/2015 Cath: LM 30-40d, LAD min irregs, LCX min irregs, RCA 30p, RA 17, RV 33/7, PA 42/19, PCWP 17.  . On home oxygen therapy    "2L w/CPAP at night" (02/24/2017)  . Osteoarthritis    a. 06/2015 s/p R total hip arthroplasty.  [name] PAF (paroxysmal atrial fibrillation) ([place])    a. Dx 07/2015, CHA2DS2VASc = 6-->Eliquis.  . Premature atrial contractions   . Pulmonary hypertension ([place])    a. 07/2015 Echo: PASP 11mmHg.  [name] Pyloric antral stenosis    in childhood,infancy  . Sleep apnea with use of continuous positive airway pressure (CPAP) 10/19/2012   a. compliant w/ CPAP.    PAST SURGICAL HISTORY: Past Surgical History:  Procedure Laterality Date  . ABDOMINAL HYSTERECTOMY  1970  . ABDOMINAL SURGERY     as a 52 day old baby  . BREAST BIOPSY Left 2017  . CARDIAC CATHETERIZATION N/A 07/14/2015   Procedure: Right/Left Heart Cath and Coronary Angiography;  Surgeon: Sanda Klein, MD;  Location: [place] [facility];  Service: Cardiovascular;  Laterality: N/A;  . CARDIOVERSION N/A 11/04/2015   Procedure: CARDIOVERSION;  Surgeon: Lelon Perla, MD;  Location: [facility] ENDOSCOPY;  Service: Cardiovascular;  Laterality: N/A;  . CARDIOVERSION N/A 02/25/2016   Procedure: CARDIOVERSION;  Surgeon: Pixie Casino, MD;  Location: [facility] ENDOSCOPY;  Service: Cardiovascular;  Laterality: N/A;  . CARDIOVERSION N/A 12/13/2016   Procedure: CARDIOVERSION;  Surgeon: Fay Records, MD;  Location: [facility] ENDOSCOPY;  Service: Cardiovascular;  Laterality: N/A;  . CARDIOVERSION N/A  03/23/2017   Procedure: CARDIOVERSION;  Surgeon: Sueanne Margarita, MD;  Location: [facility] ENDOSCOPY;  Service: Cardiovascular;  Laterality: N/A;  . COLONOSCOPY    . EYE SURGERY Bilateral    cataract surgery with lens implant  . INNER EAR SURGERY Left    x 3  . JOINT REPLACEMENT Right   . LAPAROSCOPIC SALPINGO OOPHERECTOMY    . NM MYOVIEW LTD  11/30/2007   normal stress nuclear study/EF-83%  . TONSILLECTOMY    . TOTAL HIP ARTHROPLASTY Right 06/26/2015   Procedure: RIGHT TOTAL HIP ARTHROPLASTY ANTERIOR APPROACH;  Surgeon: Marylynn Pearson  Erlinda Hong, MD;  Location: [facility];  Service: Orthopedics;  Laterality: Right;  . TRANSTHORACIC ECHOCARDIOGRAM  07/23/2009   mild concentric LVH/mild mitral insufficiency/ moderate tricuspid innsufficiency/mild pulmonary HTN/EF- 55-60%  . VAGINAL DELIVERY      FAMILY HISTORY: Family History  Problem Relation Age of Onset  . Heart attack Father   . Heart failure Mother   . Cancer Mother        colon,kidney  . Diabetes Sister   . Hypertension Sister   . Heart failure Sister     SOCIAL HISTORY: Social History   Socioeconomic History  . Marital status: Widowed    Spouse name: Not on file  . Number of children: 1  . Years of education: 65  . Highest education level: Not on file  Occupational History  . Occupation: Retired  Scientific laboratory technician  . Financial resource strain: Not on file  . Food insecurity:    Worry: Not on file    Inability: Not on file  . Transportation needs:    Medical: Not on file    Non-medical: Not on file  Tobacco Use  . Smoking status: Never Smoker  . Smokeless tobacco: Never Used  Substance and Sexual Activity  . Alcohol use: No    Alcohol/week: 0.0 oz    Comment: rare  . Drug use: No  . Sexual activity: Not on file  Lifestyle  . Physical activity:    Days per week: Not on file    Minutes per session: Not on file  . Stress: Not on file  Relationships  . Social connections:    Talks on phone: Not on file    Gets together: Not on file      Attends religious service: Not on file    Active member of club or organization: Not on file    Attends meetings of clubs or organizations: Not on file    Relationship status: Not on file  . Intimate partner violence:    Fear of current or ex partner: Not on file    Emotionally abused: Not on file    Physically abused: Not on file    Forced sexual activity: Not on file  Other Topics Concern  . Not on file  Social History Narrative   Patient lives alone.    Patient is widowed.    Patient retired.    Patient some college.    Patient has one child.    Caffeine 1-2 cups daily avg.     PHYSICAL EXAM  Vitals:   07/21/17 1306  BP: (!) 105/55  Pulse: 96  Weight: 165 lb (74.8 kg)  Height: 5' (1.524 m)   Body mass index is 32.22 kg/m.    Cardiac, normal BP, but irregular pulses, tachycardia.  No wheezing.  Neck circumference :   15.00 inches.  Mallampati: 3, with elongated uvula, red mucosa.    Nasal airflow : congestion, rhinitis . No TMJ pain nor retrognathia, dentures, partial upper.  Generalized: Well developed and groomed , but obese female in no acute distress  Head: normocephalic and atraumatic .Oropharynx benign  Neck: Supple,   Mentation: Alert oriented to time, place, history taking. Follows all commands speech and language fluent.   Hoarseness. Cranial nerve : Pupils  reactive to light , extraocular movements were full, she has macular degeneration with blurred visuala fields. Left dry and right eye is wet-  . Facial sensation and strength were normal.  Hearing was intact to finger rubbing, but she reports difficulties differentiating  voices in a busy room with noise. [name]  Uvula and tongue in midline. The head turning and shoulder shrug were normal and symmetric.Tongue protrusion into cheek strength was normal.  Motor: normal bulk and tone, full strength in the BUE, fine finger movements normal, no pronator drift. No focal weakness. She feels often sore -   Gait  and Station: Rising up from seated position with bracing -but not longer on a walker. She reports back pain.   [name]   DIAGNOSTIC DATA (LABS, IMAGING, TESTING)   CPAP download - reviewed cardioversion record, hospital admission, afib- failed cardioversion.delirium. Complete hip replacement .    ASSESSMENT AND PLAN   Complex sleep apnea with the use of continuous positive airway pressure; more central apneas among the residual apneas.  Insomnia;  chronic , yet improved on auto CPAP-  She takes Ativan for sleep.  No changes in settings. Her choice of interface. Delirium, can be part of early dementia. Next visit with MMSE/ MOCA  Her central apnea, cheyne stokes respirations are new- CHF related.    CC Dr Joylene Draft. Dr Kathrin Penner,  Dr Peter Martinique, MD    RV 6 month - MOCA/MMSE  [facility] [address] [place], [place] [address] [phone]

## 2017-07-21 NOTE — Patient Instructions (Signed)

## 2017-07-28 ENCOUNTER — Encounter: Payer: Self-pay | Admitting: Internal Medicine

## 2017-07-28 ENCOUNTER — Ambulatory Visit: Payer: Medicare HMO | Admitting: Internal Medicine

## 2017-07-28 VITALS — BP 124/76 | HR 111 | Ht 59.0 in | Wt 164.4 lb

## 2017-07-28 DIAGNOSIS — I10 Essential (primary) hypertension: Secondary | ICD-10-CM

## 2017-07-28 DIAGNOSIS — I5032 Chronic diastolic (congestive) heart failure: Secondary | ICD-10-CM | POA: Diagnosis not present

## 2017-07-28 DIAGNOSIS — I484 Atypical atrial flutter: Secondary | ICD-10-CM | POA: Diagnosis not present

## 2017-07-28 DIAGNOSIS — I480 Paroxysmal atrial fibrillation: Secondary | ICD-10-CM | POA: Diagnosis not present

## 2017-07-28 NOTE — Patient Instructions (Addendum)
Medication Instructions:  Your physician recommends that you continue on your current medications as directed. Please refer to the Current Medication list given to you today.  Labwork: You will get lab work today:  BMP and CBC.  Testing/Procedures: Your physician has recommended that you have a pacemaker inserted. A pacemaker is a small device that is placed under the skin of your chest or abdomen to help control abnormal heart rhythms. This device uses electrical pulses to prompt the heart to beat at a normal rate. Pacemakers are used to treat heart rhythms that are too slow. Wire (leads) are attached to the pacemaker that goes into the chambers of you heart. This is done in the hospital and usually requires and overnight stay. Please see the instruction sheet given to you today for more information.  Follow-Up: You will follow up with device clinic 10-14 days after your procedure for a wound check.  You will follow up with Dr. Lovena Le 91 days after your procedure.  Any Other Special Instructions Will Be Listed Below (If Applicable).  Please arrive to Admitting down the hall from the main entrance of [facility] hospital at:  9:30 on Aug 04, 2017. Use the CHG scrub as directed Do not eat or drink after midnight prior to procedure Hold your Eliquis for 2 days prior to your procedure, your last dose will be Aug 01, 2017 your evening dose You may take your normal morning medications except for:  torsemide Plan for one night stay You will need someone to drive you home at discharge  If you need a refill on your cardiac medications before your next appointment, please call your pharmacy.   Pacemaker Implantation, Adult Pacemaker implantation is a procedure to place a pacemaker inside your chest. A pacemaker is a small computer that sends electrical signals to the heart and helps your heart beat normally. A pacemaker also stores information about your heart rhythms. You may need pacemaker  implantation if you:  Have a slow heartbeat (bradycardia).  Faint (syncope).  Have shortness of breath (dyspnea) due to heart problems.  The pacemaker attaches to your heart through a wire, called a lead. Sometimes just one lead is needed. Other times, there will be two leads. There are two types of pacemakers:  Transvenous pacemaker. This type is placed under the skin or muscle of your chest. The lead goes through a vein in the chest area to reach the inside of the heart.  Epicardial pacemaker. This type is placed under the skin or muscle of your chest or belly. The lead goes through your chest to the outside of the heart.  Tell a health care provider about:  Any allergies you have.  All medicines you are taking, including vitamins, herbs, eye drops, creams, and over-the-counter medicines.  Any problems you or family members have had with anesthetic medicines.  Any blood or bone disorders you have.  Any surgeries you have had.  Any medical conditions you have.  Whether you are pregnant or may be pregnant. What are the risks? Generally, this is a safe procedure. However, problems may occur, including:  Infection.  Bleeding.  Failure of the pacemaker or the lead.  Collapse of a lung or bleeding into a lung.  Blood clot inside a blood vessel with a lead.  Damage to the heart.  Infection inside the heart (endocarditis).  Allergic reactions to medicines.  What happens before the procedure? Staying hydrated Follow instructions from your health care provider about hydration, which may include:  Up to 2 hours before the procedure - you may continue to drink clear liquids, such as water, clear fruit juice, black coffee, and plain tea.  Eating and drinking restrictions Follow instructions from your health care provider about eating and drinking, which may include:  8 hours before the procedure - stop eating heavy meals or foods such as meat, fried foods, or fatty  foods.  6 hours before the procedure - stop eating light meals or foods, such as toast or cereal.  6 hours before the procedure - stop drinking milk or drinks that contain milk.  2 hours before the procedure - stop drinking clear liquids.  Medicines  Ask your health care provider about: ? Changing or stopping your regular medicines. This is especially important if you are taking diabetes medicines or blood thinners. ? Taking medicines such as aspirin and ibuprofen. These medicines can thin your blood. Do not take these medicines before your procedure if your health care provider instructs you not to.  You may be given antibiotic medicine to help prevent infection. General instructions  You will have a heart evaluation. This may include an electrocardiogram (ECG), chest X-ray, and heart imaging (echocardiogram,  or echo) tests.  You will have blood tests.  Do not use any products that contain nicotine or tobacco, such as cigarettes and e-cigarettes. If you need help quitting, ask your health care provider.  Plan to have someone take you home from the hospital or clinic.  If you will be going home right after the procedure, plan to have someone with you for 24 hours.  Ask your health care provider how your surgical site will be marked or identified. What happens during the procedure?  To reduce your risk of infection: ? Your health care team will wash or sanitize their hands. ? Your skin will be washed with soap. ? Hair may be removed from the surgical area.  An IV tube will be inserted into one of your veins.  You will be given one or more of the following: ? A medicine to help you relax (sedative). ? A medicine to numb the area (local anesthetic). ? A medicine to make you fall asleep (general anesthetic).  If you are getting a transvenous pacemaker: ? An incision will be made in your upper chest. ? A pocket will be made for the pacemaker. It may be placed under the skin or  between layers of muscle. ? The lead will be inserted into a blood vessel that returns to the heart. ? While X-rays are taken by an imaging machine (fluoroscopy), the lead will be advanced through the vein to the inside of your heart. ? The other end of the lead will be tunneled under the skin and attached to the pacemaker.  If you are getting an epicardial pacemaker: ? An incision will be made near your ribs or breastbone (sternum) for the lead. ? The lead will be attached to the outside of your heart. ? Another incision will be made in your chest or upper belly to create a pocket for the pacemaker. ? The free end of the lead will be tunneled under the skin and attached to the pacemaker.  The transvenous or epicardial pacemaker will be tested. Imaging studies may be done to check the lead position.  The incisions will be closed with stitches (sutures), adhesive strips, or skin glue.  Bandages (dressing) will be placed over the incisions. The procedure may vary among health care providers and hospitals. What happens  after the procedure?  Your blood pressure, heart rate, breathing rate, and blood oxygen level will be monitored until the medicines you were given have worn off.  You will be given antibiotics and pain medicine.  ECG and chest x-rays will be done.  You will wear a continuous type of ECG (Holter monitor) to check your heart rhythm.  Your health care provider willprogram the pacemaker.  Do not drive for 24 hours if you received a sedative. This information is not intended to replace advice given to you by your health care provider. Make sure you discuss any questions you have with your health care provider. Document Released: 02/12/2002 Document Revised: 09/12/2015 Document Reviewed: 08/06/2015 Elsevier Interactive Patient Education  [employer].

## 2017-07-28 NOTE — H&P (View-Only) (Signed)
HPI Melinda [name] returns today for ongoing evaluation and management of atrial fib and atypical atrial flutter with a RVR. She was placed on amiodarone several months ago after presenting with rapid atypical atrial flutter. I saw her 3 months ago and she was out of rhythm and going fast. She reverted back to NSR and her rates were in the low 40's and high 30's which she shows me today. She felt poorly on these days. Over the past 2-3 days, she has been back out of rhythm. She cannot tell that she is out of rhythm except that her HR has been in the 110 range.   Allergies  Allergen Reactions  . Lasix [Furosemide] Itching  . Phenobarbital Itching  . Adhesive [Tape] Other (See Comments)    SKIN WILL TEAR EASILY; PLEASE USE PAPER TAPE OR COBAN WRAP!!  . Amitiza [Lubiprostone] Itching     Current Outpatient Medications  Medication Sig Dispense Refill  . acetaminophen (TYLENOL) 650 MG CR tablet Take 650-1,300 mg by mouth 2 (two) times daily as needed (for arthritis pain).     [name] amiodarone (PACERONE) 200 MG tablet Take 1 tablet (200 mg total) by mouth daily. 180 tablet 3  . apixaban (ELIQUIS) 5 MG TABS tablet Take 1 tablet (5 mg total) by mouth 2 (two) times daily. 60 tablet 0  . atorvastatin (LIPITOR) 40 MG tablet Take 40 mg by mouth daily.     . Calcium Carbonate-Vitamin D3 (CALCIUM 600+D3) 600-400 MG-UNIT TABS Take 1 tablet by mouth daily.    . cholecalciferol (VITAMIN D) 1000 UNITS tablet Take 1,000 Units by mouth daily.     [name] desipramine (NORPRAMIN) 25 MG tablet Take 25 mg by mouth daily.    . famotidine (PEPCID) 20 MG tablet Take 20 mg by mouth daily.    [name] glucosamine-chondroitin 500-400 MG tablet Take 1 tablet by mouth every other day.     . linaclotide (LINZESS) 145 MCG CAPS capsule Take 145 mcg by mouth daily before breakfast.     . LORazepam (ATIVAN) 1 MG tablet Take 2 tablets by mouth at bedtime.   5  . Multiple Vitamins-Minerals (OCUVITE PRESERVISION PO) Take 1 tablet by  mouth daily.    Melinda [name] Glycol-Propyl Glycol (SYSTANE) 0.4-0.3 % GEL ophthalmic gel Place 1 application into both eyes at bedtime.     . polyethylene glycol (MIRALAX / GLYCOLAX) packet Take 17 g by mouth at bedtime. Mix in 8 oz liquid and drink    . potassium chloride (K-DUR) 10 MEQ tablet Take 10 mEq by mouth daily.    [name] PRESCRIPTION MEDICATION CPAP with oxygen at bedtime    . torsemide (DEMADEX) 10 MG tablet Take 10 mg by mouth daily.    . vitamin B-12 (CYANOCOBALAMIN) 1000 MCG tablet Take 1,000 mcg by mouth daily.     No current facility-administered medications for this visit.      Past Medical History:  Diagnosis Date  . A-fib ([place])   . Acid reflux   . Anxiety   . Arthritis   . Cataract    surgery,bilateral  . Constipation   . Depression   . Diabetes mellitus without complication (HCC)    Pre diabetes  . Dyspnea on exertion    a. 07/2015 Echo: EF 55-60%, no rwma, mild MR, mod dil LA/RA/RV, mod TR, triv PR, PASP 50mmHg.  . Falls   . Family history of adverse reaction to anesthesia    mother always had n/v  . Hx:  UTI (urinary tract infection)   . Hypercholesterolemia   . Hypertensive heart disease   . Insomnia   . Multifactorial gait disorder   . Non-obstructive CAD    a. 07/2015 Cath: LM 30-40d, LAD min irregs, LCX min irregs, RCA 30p, RA 17, RV 33/7, PA 42/19, PCWP 17.  . On home oxygen therapy    "2L w/CPAP at night" (02/24/2017)  . Osteoarthritis    a. 06/2015 s/p R total hip arthroplasty.  [name] PAF (paroxysmal atrial fibrillation) ([place])    a. Dx 07/2015, CHA2DS2VASc = 6-->Eliquis.  . Premature atrial contractions   . Pulmonary hypertension ([place])    a. 07/2015 Echo: PASP 7mmHg.  [name] Pyloric antral stenosis    in childhood,infancy  . Sleep apnea with use of continuous positive airway pressure (CPAP) 10/19/2012   a. compliant w/ CPAP.    ROS:   All systems reviewed and negative except as noted in the HPI.   Past Surgical History:  Procedure Laterality Date  .  ABDOMINAL HYSTERECTOMY  1970  . ABDOMINAL SURGERY     as a 57 day old baby  . BREAST BIOPSY Left 2017  . CARDIAC CATHETERIZATION N/A 07/14/2015   Procedure: Right/Left Heart Cath and Coronary Angiography;  Surgeon: Sanda Klein, MD;  Location: [facility] LAB;  Service: Cardiovascular;  Laterality: N/A;  . CARDIOVERSION N/A 11/04/2015   Procedure: CARDIOVERSION;  Surgeon: Lelon Perla, MD;  Location: [facility] ENDOSCOPY;  Service: Cardiovascular;  Laterality: N/A;  . CARDIOVERSION N/A 02/25/2016   Procedure: CARDIOVERSION;  Surgeon: Pixie Casino, MD;  Location: [facility] ENDOSCOPY;  Service: Cardiovascular;  Laterality: N/A;  . CARDIOVERSION N/A 12/13/2016   Procedure: CARDIOVERSION;  Surgeon: Fay Records, MD;  Location: [facility] ENDOSCOPY;  Service: Cardiovascular;  Laterality: N/A;  . CARDIOVERSION N/A 03/23/2017   Procedure: CARDIOVERSION;  Surgeon: Sueanne Margarita, MD;  Location: [facility] ENDOSCOPY;  Service: Cardiovascular;  Laterality: N/A;  . COLONOSCOPY    . EYE SURGERY Bilateral    cataract surgery with lens implant  . INNER EAR SURGERY Left    x 3  . JOINT REPLACEMENT Right   . LAPAROSCOPIC SALPINGO OOPHERECTOMY    . NM MYOVIEW LTD  11/30/2007   normal stress nuclear study/EF-83%  . TONSILLECTOMY    . TOTAL HIP ARTHROPLASTY Right 06/26/2015   Procedure: RIGHT TOTAL HIP ARTHROPLASTY ANTERIOR APPROACH;  Surgeon: Leandrew Koyanagi, MD;  Location: [facility];  Service: Orthopedics;  Laterality: Right;  . TRANSTHORACIC ECHOCARDIOGRAM  07/23/2009   mild concentric LVH/mild mitral insufficiency/ moderate tricuspid innsufficiency/mild pulmonary HTN/EF- 55-60%  . VAGINAL DELIVERY       Family History  Problem Relation Age of Onset  . Heart attack Father   . Heart failure Mother   . Cancer Mother        colon,kidney  . Diabetes Sister   . Hypertension Sister   . Heart failure Sister      Social History   Socioeconomic History  . Marital status: Widowed    Spouse name: Not on file  . Number of  children: 1  . Years of education: 28  . Highest education level: Not on file  Occupational History  . Occupation: Retired  Scientific laboratory technician  . Financial resource strain: Not on file  . Food insecurity:    Worry: Not on file    Inability: Not on file  . Transportation needs:    Medical: Not on file    Non-medical: Not on file  Tobacco Use  .  Smoking status: Never Smoker  . Smokeless tobacco: Never Used  Substance and Sexual Activity  . Alcohol use: No    Alcohol/week: 0.0 oz    Comment: rare  . Drug use: No  . Sexual activity: Not on file  Lifestyle  . Physical activity:    Days per week: Not on file    Minutes per session: Not on file  . Stress: Not on file  Relationships  . Social connections:    Talks on phone: Not on file    Gets together: Not on file    Attends religious service: Not on file    Active member of club or organization: Not on file    Attends meetings of clubs or organizations: Not on file    Relationship status: Not on file  . Intimate partner violence:    Fear of current or ex partner: Not on file    Emotionally abused: Not on file    Physically abused: Not on file    Forced sexual activity: Not on file  Other Topics Concern  . Not on file  Social History Narrative   Patient lives alone.    Patient is widowed.    Patient retired.    Patient some college.    Patient has one child.    Caffeine 1-2 cups daily avg.     BP 124/76   Pulse (!) 111   Ht 4\' 11"  (1.499 m)   Wt 164 lb 6.4 oz (74.6 kg)   SpO2 96%   BMI 33.20 kg/m   Physical Exam:  obese appearing 84 yo woman, NAD HEENT: Unremarkable Neck:  6 cm JVD, no thyromegally Lymphatics:  No adenopathy Back:  No CVA tenderness Lungs:  Clear with no wheezes HEART:  IRegular tachy rhythm, no murmurs, no rubs, no clicks Abd:  soft, positive bowel sounds, no organomegally, no rebound, no guarding Ext:  2 plus pulses, no edema, no cyanosis, no clubbing Skin:  No rashes no nodules Neuro:  CN  II through XII intact, motor grossly intact  EKG - atrial fib/flutter with a RVR   Assess/Plan: 1. Symptomatic tachy-brady - the patient is symptomatic and has both very slow and fast HR's. I have recommended insertion of a DDD PM.  2. PAF - she is in atypical atrial flutter/fib today and her rate is increased.  3. Chest pain - she is well controlled.  4. Obesity - her BMI is increased. She is encouraged to lose weight.   Melinda [name].D

## 2017-07-28 NOTE — Progress Notes (Signed)
HPI Mrs. Renteria returns today for ongoing evaluation and management of atrial fib and atypical atrial flutter with a RVR. She was placed on amiodarone several months ago after presenting with rapid atypical atrial flutter. I saw her 3 months ago and she was out of rhythm and going fast. She reverted back to NSR and her rates were in the low 40's and high 30's which she shows me today. She felt poorly on these days. Over the past 2-3 days, she has been back out of rhythm. She cannot tell that she is out of rhythm except that her HR has been in the 110 range.   Allergies  Allergen Reactions  . Lasix [Furosemide] Itching  . Phenobarbital Itching  . Adhesive [Tape] Other (See Comments)    SKIN WILL TEAR EASILY; PLEASE USE PAPER TAPE OR COBAN WRAP!!  . Amitiza [Lubiprostone] Itching     Current Outpatient Medications  Medication Sig Dispense Refill  . acetaminophen (TYLENOL) 650 MG CR tablet Take 650-1,300 mg by mouth 2 (two) times daily as needed (for arthritis pain).     [name] amiodarone (PACERONE) 200 MG tablet Take 1 tablet (200 mg total) by mouth daily. 180 tablet 3  . apixaban (ELIQUIS) 5 MG TABS tablet Take 1 tablet (5 mg total) by mouth 2 (two) times daily. 60 tablet 0  . atorvastatin (LIPITOR) 40 MG tablet Take 40 mg by mouth daily.     . Calcium Carbonate-Vitamin D3 (CALCIUM 600+D3) 600-400 MG-UNIT TABS Take 1 tablet by mouth daily.    . cholecalciferol (VITAMIN D) 1000 UNITS tablet Take 1,000 Units by mouth daily.     [name] desipramine (NORPRAMIN) 25 MG tablet Take 25 mg by mouth daily.    . famotidine (PEPCID) 20 MG tablet Take 20 mg by mouth daily.    [name] glucosamine-chondroitin 500-400 MG tablet Take 1 tablet by mouth every other day.     . linaclotide (LINZESS) 145 MCG CAPS capsule Take 145 mcg by mouth daily before breakfast.     . LORazepam (ATIVAN) 1 MG tablet Take 2 tablets by mouth at bedtime.   5  . Multiple Vitamins-Minerals (OCUVITE PRESERVISION PO) Take 1 tablet by  mouth daily.    [name] Glycol-Propyl Glycol (SYSTANE) 0.4-0.3 % GEL ophthalmic gel Place 1 application into both eyes at bedtime.     . polyethylene glycol (MIRALAX / GLYCOLAX) packet Take 17 g by mouth at bedtime. Mix in 8 oz liquid and drink    . potassium chloride (K-DUR) 10 MEQ tablet Take 10 mEq by mouth daily.    [name] PRESCRIPTION MEDICATION CPAP with oxygen at bedtime    . torsemide (DEMADEX) 10 MG tablet Take 10 mg by mouth daily.    . vitamin B-12 (CYANOCOBALAMIN) 1000 MCG tablet Take 1,000 mcg by mouth daily.     No current facility-administered medications for this visit.      Past Medical History:  Diagnosis Date  . A-fib ([place])   . Acid reflux   . Anxiety   . Arthritis   . Cataract    surgery,bilateral  . Constipation   . Depression   . Diabetes mellitus without complication (HCC)    Pre diabetes  . Dyspnea on exertion    a. 07/2015 Echo: EF 55-60%, no rwma, mild MR, mod dil LA/RA/RV, mod TR, triv PR, PASP 45mmHg.  . Falls   . Family history of adverse reaction to anesthesia    mother always had n/v  . Hx:  UTI (urinary tract infection)   . Hypercholesterolemia   . Hypertensive heart disease   . Insomnia   . Multifactorial gait disorder   . Non-obstructive CAD    a. 07/2015 Cath: LM 30-40d, LAD min irregs, LCX min irregs, RCA 30p, RA 17, RV 33/7, PA 42/19, PCWP 17.  . On home oxygen therapy    "2L w/CPAP at night" (02/24/2017)  . Osteoarthritis    a. 06/2015 s/p R total hip arthroplasty.  [name] PAF (paroxysmal atrial fibrillation) ([place])    a. Dx 07/2015, CHA2DS2VASc = 6-->Eliquis.  . Premature atrial contractions   . Pulmonary hypertension ([place])    a. 07/2015 Echo: PASP 38mmHg.  [name] Pyloric antral stenosis    in childhood,infancy  . Sleep apnea with use of continuous positive airway pressure (CPAP) 10/19/2012   a. compliant w/ CPAP.    ROS:   All systems reviewed and negative except as noted in the HPI.   Past Surgical History:  Procedure Laterality Date  .  ABDOMINAL HYSTERECTOMY  1970  . ABDOMINAL SURGERY     as a 8 day old baby  . BREAST BIOPSY Left 2017  . CARDIAC CATHETERIZATION N/A 07/14/2015   Procedure: Right/Left Heart Cath and Coronary Angiography;  Surgeon: Sanda Klein, MD;  Location: [facility];  Service: Cardiovascular;  Laterality: N/A;  . CARDIOVERSION N/A 11/04/2015   Procedure: CARDIOVERSION;  Surgeon: Lelon Perla, MD;  Location: [facility] ENDOSCOPY;  Service: Cardiovascular;  Laterality: N/A;  . CARDIOVERSION N/A 02/25/2016   Procedure: CARDIOVERSION;  Surgeon: Pixie Casino, MD;  Location: [facility] ENDOSCOPY;  Service: Cardiovascular;  Laterality: N/A;  . CARDIOVERSION N/A 12/13/2016   Procedure: CARDIOVERSION;  Surgeon: Fay Records, MD;  Location: [facility]COPY;  Service: Cardiovascular;  Laterality: N/A;  . CARDIOVERSION N/A 03/23/2017   Procedure: CARDIOVERSION;  Surgeon: Sueanne Margarita, MD;  Location: [facility] ENDOSCOPY;  Service: Cardiovascular;  Laterality: N/A;  . COLONOSCOPY    . EYE SURGERY Bilateral    cataract surgery with lens implant  . INNER EAR SURGERY Left    x 3  . JOINT REPLACEMENT Right   . LAPAROSCOPIC SALPINGO OOPHERECTOMY    . NM MYOVIEW LTD  11/30/2007   normal stress nuclear study/EF-83%  . TONSILLECTOMY    . TOTAL HIP ARTHROPLASTY Right 06/26/2015   Procedure: RIGHT TOTAL HIP ARTHROPLASTY ANTERIOR APPROACH;  Surgeon: Leandrew Koyanagi, MD;  Location: [place] [facility];  Service: Orthopedics;  Laterality: Right;  . TRANSTHORACIC ECHOCARDIOGRAM  07/23/2009   mild concentric LVH/mild mitral insufficiency/ moderate tricuspid innsufficiency/mild pulmonary HTN/EF- 55-60%  . VAGINAL DELIVERY       Family History  Problem Relation Age of Onset  . Heart attack Father   . Heart failure Mother   . Cancer Mother        colon,kidney  . Diabetes Sister   . Hypertension Sister   . Heart failure Sister      Social History   Socioeconomic History  . Marital status: Widowed    Spouse name: Not on file  . Number of  children: 1  . Years of education: 44  . Highest education level: Not on file  Occupational History  . Occupation: Retired  Scientific laboratory technician  . Financial resource strain: Not on file  . Food insecurity:    Worry: Not on file    Inability: Not on file  . Transportation needs:    Medical: Not on file    Non-medical: Not on file  Tobacco Use  .  Smoking status: Never Smoker  . Smokeless tobacco: Never Used  Substance and Sexual Activity  . Alcohol use: No    Alcohol/week: 0.0 oz    Comment: rare  . Drug use: No  . Sexual activity: Not on file  Lifestyle  . Physical activity:    Days per week: Not on file    Minutes per session: Not on file  . Stress: Not on file  Relationships  . Social connections:    Talks on phone: Not on file    Gets together: Not on file    Attends religious service: Not on file    Active member of club or organization: Not on file    Attends meetings of clubs or organizations: Not on file    Relationship status: Not on file  . Intimate partner violence:    Fear of current or ex partner: Not on file    Emotionally abused: Not on file    Physically abused: Not on file    Forced sexual activity: Not on file  Other Topics Concern  . Not on file  Social History Narrative   Patient lives alone.    Patient is widowed.    Patient retired.    Patient some college.    Patient has one child.    Caffeine 1-2 cups daily avg.     BP 124/76   Pulse (!) 111   Ht 4\' 11"  (1.499 m)   Wt 164 lb 6.4 oz (74.6 kg)   SpO2 96%   BMI 33.20 kg/m   Physical Exam:  obese appearing 84 yo woman, NAD HEENT: Unremarkable Neck:  6 cm JVD, no thyromegally Lymphatics:  No adenopathy Back:  No CVA tenderness Lungs:  Clear with no wheezes HEART:  IRegular tachy rhythm, no murmurs, no rubs, no clicks Abd:  soft, positive bowel sounds, no organomegally, no rebound, no guarding Ext:  2 plus pulses, no edema, no cyanosis, no clubbing Skin:  No rashes no nodules Neuro:  CN  II through XII intact, motor grossly intact  EKG - atrial fib/flutter with a RVR   Assess/Plan: 1. Symptomatic tachy-brady - the patient is symptomatic and has both very slow and fast HR's. I have recommended insertion of a DDD PM.  2. PAF - she is in atypical atrial flutter/fib today and her rate is increased.  3. Chest pain - she is well controlled.  4. Obesity - her BMI is increased. She is encouraged to lose weight.   Mikle Bosworth.D

## 2017-07-29 LAB — CBC WITH DIFFERENTIAL/PLATELET
BASOS: 1 %
Basophils Absolute: 0 10*3/uL (ref 0.0–0.2)
EOS (ABSOLUTE): 0.1 10*3/uL (ref 0.0–0.4)
EOS: 2 %
Hematocrit: 41.8 % (ref 34.0–46.6)
Hemoglobin: 13.6 g/dL (ref 11.1–15.9)
Immature Grans (Abs): 0 10*3/uL (ref 0.0–0.1)
Immature Granulocytes: 0 %
LYMPHS ABS: 1.3 10*3/uL (ref 0.7–3.1)
Lymphs: 22 %
MCH: 31.4 pg (ref 26.6–33.0)
MCHC: 32.5 g/dL (ref 31.5–35.7)
MCV: 97 fL (ref 79–97)
MONOS ABS: 0.7 10*3/uL (ref 0.1–0.9)
Monocytes: 12 %
Neutrophils Absolute: 3.7 10*3/uL (ref 1.4–7.0)
Neutrophils: 63 %
Platelets: 210 10*3/uL (ref 150–450)
RBC: 4.33 x10E6/uL (ref 3.77–5.28)
RDW: 14.3 % (ref 12.3–15.4)
WBC: 5.9 10*3/uL (ref 3.4–10.8)

## 2017-07-29 LAB — BASIC METABOLIC PANEL
BUN / CREAT RATIO: 17 (ref 12–28)
BUN: 22 mg/dL (ref 8–27)
CO2: 25 mmol/L (ref 20–29)
Calcium: 9.3 mg/dL (ref 8.7–10.3)
Chloride: 102 mmol/L (ref 96–106)
Creatinine, Ser: 1.32 mg/dL — ABNORMAL HIGH (ref 0.57–1.00)
GFR calc non Af Amer: 37 mL/min/{1.73_m2} — ABNORMAL LOW (ref >59)
GFR, EST AFRICAN AMERICAN: 43 mL/min/{1.73_m2} — AB (ref >59)
Glucose: 123 mg/dL — ABNORMAL HIGH (ref 65–99)
POTASSIUM: 4 mmol/L (ref 3.5–5.2)
SODIUM: 144 mmol/L (ref 134–144)

## 2017-08-01 DIAGNOSIS — R0902 Hypoxemia: Secondary | ICD-10-CM | POA: Diagnosis not present

## 2017-08-01 DIAGNOSIS — I5043 Acute on chronic combined systolic (congestive) and diastolic (congestive) heart failure: Secondary | ICD-10-CM | POA: Diagnosis not present

## 2017-08-01 DIAGNOSIS — G4733 Obstructive sleep apnea (adult) (pediatric): Secondary | ICD-10-CM | POA: Diagnosis not present

## 2017-08-04 ENCOUNTER — Ambulatory Visit (HOSPITAL_COMMUNITY)
Admission: RE | Admit: 2017-08-04 | Discharge: 2017-08-05 | Disposition: A | Discharge status: Home / Self Care | Payer: Medicare HMO | Source: Ambulatory Visit | Attending: Internal Medicine | Admitting: Internal Medicine

## 2017-08-04 ENCOUNTER — Encounter (HOSPITAL_COMMUNITY): Payer: Self-pay | Admitting: Internal Medicine

## 2017-08-04 ENCOUNTER — Encounter (HOSPITAL_COMMUNITY)
Admission: RE | Disposition: A | Discharge status: Home / Self Care | Payer: Self-pay | Source: Ambulatory Visit | Attending: Internal Medicine

## 2017-08-04 DIAGNOSIS — F419 Anxiety disorder, unspecified: Secondary | ICD-10-CM | POA: Diagnosis not present

## 2017-08-04 DIAGNOSIS — I272 Pulmonary hypertension, unspecified: Secondary | ICD-10-CM | POA: Insufficient documentation

## 2017-08-04 DIAGNOSIS — Z8249 Family history of ischemic heart disease and other diseases of the circulatory system: Secondary | ICD-10-CM | POA: Insufficient documentation

## 2017-08-04 DIAGNOSIS — E7800 Pure hypercholesterolemia, unspecified: Secondary | ICD-10-CM | POA: Insufficient documentation

## 2017-08-04 DIAGNOSIS — I495 Sick sinus syndrome: Secondary | ICD-10-CM | POA: Diagnosis not present

## 2017-08-04 DIAGNOSIS — E119 Type 2 diabetes mellitus without complications: Secondary | ICD-10-CM | POA: Insufficient documentation

## 2017-08-04 DIAGNOSIS — I119 Hypertensive heart disease without heart failure: Secondary | ICD-10-CM | POA: Insufficient documentation

## 2017-08-04 DIAGNOSIS — I2510 Atherosclerotic heart disease of native coronary artery without angina pectoris: Secondary | ICD-10-CM | POA: Diagnosis not present

## 2017-08-04 DIAGNOSIS — K219 Gastro-esophageal reflux disease without esophagitis: Secondary | ICD-10-CM | POA: Insufficient documentation

## 2017-08-04 DIAGNOSIS — Z9981 Dependence on supplemental oxygen: Secondary | ICD-10-CM | POA: Diagnosis not present

## 2017-08-04 DIAGNOSIS — I484 Atypical atrial flutter: Secondary | ICD-10-CM | POA: Insufficient documentation

## 2017-08-04 DIAGNOSIS — M1990 Unspecified osteoarthritis, unspecified site: Secondary | ICD-10-CM | POA: Diagnosis not present

## 2017-08-04 DIAGNOSIS — Z91040 Latex allergy status: Secondary | ICD-10-CM | POA: Diagnosis not present

## 2017-08-04 DIAGNOSIS — E669 Obesity, unspecified: Secondary | ICD-10-CM | POA: Diagnosis not present

## 2017-08-04 DIAGNOSIS — Z7901 Long term (current) use of anticoagulants: Secondary | ICD-10-CM | POA: Insufficient documentation

## 2017-08-04 DIAGNOSIS — I480 Paroxysmal atrial fibrillation: Secondary | ICD-10-CM | POA: Diagnosis not present

## 2017-08-04 DIAGNOSIS — I491 Atrial premature depolarization: Secondary | ICD-10-CM | POA: Insufficient documentation

## 2017-08-04 DIAGNOSIS — F329 Major depressive disorder, single episode, unspecified: Secondary | ICD-10-CM | POA: Insufficient documentation

## 2017-08-04 DIAGNOSIS — G4700 Insomnia, unspecified: Secondary | ICD-10-CM | POA: Insufficient documentation

## 2017-08-04 DIAGNOSIS — Z950 Presence of cardiac pacemaker: Secondary | ICD-10-CM

## 2017-08-04 DIAGNOSIS — G4733 Obstructive sleep apnea (adult) (pediatric): Secondary | ICD-10-CM | POA: Diagnosis not present

## 2017-08-04 DIAGNOSIS — Z6833 Body mass index (BMI) 33.0-33.9, adult: Secondary | ICD-10-CM | POA: Insufficient documentation

## 2017-08-04 HISTORY — PX: PACEMAKER IMPLANT: EP1218

## 2017-08-04 LAB — GLUCOSE, CAPILLARY
GLUCOSE-CAPILLARY: 118 mg/dL — AB (ref 65–99)
GLUCOSE-CAPILLARY: 121 mg/dL — AB (ref 65–99)
Glucose-Capillary: 121 mg/dL — ABNORMAL HIGH (ref 65–99)

## 2017-08-04 LAB — SURGICAL PCR SCREEN
MRSA, PCR: NEGATIVE
Staphylococcus aureus: NEGATIVE

## 2017-08-04 SURGERY — PACEMAKER IMPLANT

## 2017-08-04 MED ORDER — SODIUM CHLORIDE 0.9 % IV SOLN
INTRAVENOUS | Status: AC
  Filled 2017-08-04: qty 2

## 2017-08-04 MED ORDER — CEFAZOLIN SODIUM-DEXTROSE 1-4 GM/50ML-% IV SOLN
1.0000 g | Freq: Four times a day (QID) | INTRAVENOUS | Status: AC
  Administered 2017-08-04 – 2017-08-05 (×3): 1 g via INTRAVENOUS
  Filled 2017-08-04 (×3): qty 50

## 2017-08-04 MED ORDER — AMIODARONE HCL 200 MG PO TABS
200.0000 mg | ORAL_TABLET | Freq: Every day | ORAL | Status: DC
  Administered 2017-08-05: 200 mg via ORAL
  Filled 2017-08-04: qty 1

## 2017-08-04 MED ORDER — CEFAZOLIN SODIUM-DEXTROSE 2-4 GM/100ML-% IV SOLN
INTRAVENOUS | Status: AC
  Filled 2017-08-04: qty 100

## 2017-08-04 MED ORDER — LIDOCAINE HCL (PF) 1 % IJ SOLN
INTRAMUSCULAR | Status: DC | PRN
  Administered 2017-08-04: 45 mL

## 2017-08-04 MED ORDER — FENTANYL CITRATE (PF) 100 MCG/2ML IJ SOLN
INTRAMUSCULAR | Status: DC | PRN
  Administered 2017-08-04: 25 ug via INTRAVENOUS

## 2017-08-04 MED ORDER — POLYETHYLENE GLYCOL 3350 17 G PO PACK
17.0000 g | PACK | Freq: Every day | ORAL | Status: DC
  Administered 2017-08-04: 17 g via ORAL
  Filled 2017-08-04: qty 1

## 2017-08-04 MED ORDER — ACETAMINOPHEN 325 MG PO TABS
325.0000 mg | ORAL_TABLET | ORAL | Status: DC | PRN
  Administered 2017-08-04: 650 mg via ORAL
  Filled 2017-08-04: qty 2

## 2017-08-04 MED ORDER — ONDANSETRON HCL 4 MG/2ML IJ SOLN: 4.0000 mg | Freq: Four times a day (QID) | INTRAMUSCULAR | Status: DC | PRN

## 2017-08-04 MED ORDER — FAMOTIDINE 20 MG PO TABS
20.0000 mg | ORAL_TABLET | Freq: Every day | ORAL | Status: DC
  Administered 2017-08-05: 20 mg via ORAL
  Filled 2017-08-04: qty 1

## 2017-08-04 MED ORDER — LORAZEPAM 1 MG PO TABS
2.0000 mg | ORAL_TABLET | Freq: Every day | ORAL | Status: DC
  Administered 2017-08-04: 2 mg via ORAL
  Filled 2017-08-04: qty 2

## 2017-08-04 MED ORDER — MIDAZOLAM HCL 5 MG/5ML IJ SOLN
INTRAMUSCULAR | Status: AC
  Filled 2017-08-04: qty 5

## 2017-08-04 MED ORDER — CALCIUM CARBONATE-VITAMIN D 500-200 MG-UNIT PO TABS
ORAL_TABLET | Freq: Every day | ORAL | Status: DC
  Administered 2017-08-05: 1 via ORAL
  Filled 2017-08-04 (×2): qty 1

## 2017-08-04 MED ORDER — POTASSIUM CHLORIDE ER 10 MEQ PO TBCR
10.0000 meq | EXTENDED_RELEASE_TABLET | Freq: Every day | ORAL | Status: DC
  Administered 2017-08-05: 10 meq via ORAL
  Filled 2017-08-04 (×2): qty 1

## 2017-08-04 MED ORDER — SODIUM CHLORIDE 0.9 % IV SOLN
INTRAVENOUS | Status: DC
  Administered 2017-08-04: 10:00:00 via INTRAVENOUS

## 2017-08-04 MED ORDER — CHLORHEXIDINE GLUCONATE 4 % EX LIQD: 60.0000 mL | Freq: Once | CUTANEOUS | Status: DC

## 2017-08-04 MED ORDER — TORSEMIDE 20 MG PO TABS
10.0000 mg | ORAL_TABLET | Freq: Every day | ORAL | Status: DC
  Administered 2017-08-05: 10 mg via ORAL
  Filled 2017-08-04: qty 1

## 2017-08-04 MED ORDER — HEPARIN (PORCINE) IN NACL 1000-0.9 UT/500ML-% IV SOLN
INTRAVENOUS | Status: AC
  Filled 2017-08-04: qty 500

## 2017-08-04 MED ORDER — GENTAMICIN SULFATE 40 MG/ML IJ SOLN
80.0000 mg | INTRAMUSCULAR | Status: AC
  Administered 2017-08-04: 80 mg

## 2017-08-04 MED ORDER — MIDAZOLAM HCL 5 MG/5ML IJ SOLN
INTRAMUSCULAR | Status: DC | PRN
  Administered 2017-08-04 (×2): 1 mg via INTRAVENOUS

## 2017-08-04 MED ORDER — MUPIROCIN 2 % EX OINT
TOPICAL_OINTMENT | CUTANEOUS | Status: AC
  Administered 2017-08-04: 10:00:00
  Filled 2017-08-04: qty 22

## 2017-08-04 MED ORDER — CEFAZOLIN SODIUM-DEXTROSE 2-4 GM/100ML-% IV SOLN
2.0000 g | INTRAVENOUS | Status: AC
  Administered 2017-08-04: 2 g via INTRAVENOUS

## 2017-08-04 MED ORDER — LIDOCAINE HCL (PF) 1 % IJ SOLN
INTRAMUSCULAR | Status: AC
  Filled 2017-08-04: qty 60

## 2017-08-04 MED ORDER — POLYVINYL ALCOHOL 1.4 % OP SOLN
1.0000 [drp] | Freq: Every day | OPHTHALMIC | Status: DC
  Administered 2017-08-04: 1 [drp] via OPHTHALMIC
  Filled 2017-08-04: qty 15

## 2017-08-04 MED ORDER — VITAMIN D 1000 UNITS PO TABS
1000.0000 [IU] | ORAL_TABLET | Freq: Every day | ORAL | Status: DC
  Administered 2017-08-05: 1000 [IU] via ORAL
  Filled 2017-08-04: qty 1

## 2017-08-04 MED ORDER — LINACLOTIDE 145 MCG PO CAPS
145.0000 ug | ORAL_CAPSULE | Freq: Every day | ORAL | Status: DC
  Administered 2017-08-05: 145 ug via ORAL
  Filled 2017-08-04: qty 1

## 2017-08-04 MED ORDER — POLYETHYL GLYCOL-PROPYL GLYCOL 0.4-0.3 % OP GEL
1.0000 "application " | Freq: Every day | OPHTHALMIC | Status: DC
  Filled 2017-08-04: qty 10

## 2017-08-04 MED ORDER — HEPARIN (PORCINE) IN NACL 2-0.9 UNITS/ML
INTRAMUSCULAR | Status: AC | PRN
  Administered 2017-08-04: 500 mL

## 2017-08-04 MED ORDER — DESIPRAMINE HCL 25 MG PO TABS
25.0000 mg | ORAL_TABLET | Freq: Every day | ORAL | Status: DC
  Administered 2017-08-05: 25 mg via ORAL
  Filled 2017-08-04: qty 1

## 2017-08-04 MED ORDER — METOPROLOL TARTRATE 5 MG/5ML IV SOLN
INTRAVENOUS | Status: DC | PRN
  Administered 2017-08-04: 5 mg via INTRAVENOUS

## 2017-08-04 MED ORDER — ATORVASTATIN CALCIUM 40 MG PO TABS
40.0000 mg | ORAL_TABLET | Freq: Every day | ORAL | Status: DC
  Administered 2017-08-05: 40 mg via ORAL
  Filled 2017-08-04: qty 1

## 2017-08-04 MED ORDER — ACETAMINOPHEN ER 650 MG PO TBCR: 650.0000 mg | EXTENDED_RELEASE_TABLET | Freq: Two times a day (BID) | ORAL | Status: DC | PRN

## 2017-08-04 MED ORDER — VITAMIN B-12 1000 MCG PO TABS
1000.0000 ug | ORAL_TABLET | Freq: Every day | ORAL | Status: DC
  Administered 2017-08-05: 1000 ug via ORAL
  Filled 2017-08-04: qty 1

## 2017-08-04 MED ORDER — GLUCOSAMINE-CHONDROITIN 500-400 MG PO TABS: 1.0000 | ORAL_TABLET | ORAL | Status: DC

## 2017-08-04 MED ORDER — FENTANYL CITRATE (PF) 100 MCG/2ML IJ SOLN
INTRAMUSCULAR | Status: AC
  Filled 2017-08-04: qty 2

## 2017-08-04 SURGICAL SUPPLY — 7 items
CABLE SURGICAL S-101-97-12 (CABLE) ×2 IMPLANT
LEAD TENDRIL MRI 46CM LPA1200M (Lead) ×2 IMPLANT
LEAD TENDRIL MRI 52CM LPA1200M (Lead) ×2 IMPLANT
PACEMAKER ASSURITY DR-RF (Pacemaker) ×2 IMPLANT
PAD DEFIB LIFELINK (PAD) ×2 IMPLANT
SHEATH CLASSIC 8F (SHEATH) ×4 IMPLANT
TRAY PACEMAKER INSERTION (PACKS) ×2 IMPLANT

## 2017-08-04 NOTE — Interval H&P Note (Signed)
History and Physical Interval Note:  08/04/2017 9:42 AM  Melinda [name]  has presented today for surgery, with the diagnosis of tachibradie  The various methods of treatment have been discussed with the patient and family. After consideration of risks, benefits and other options for treatment, the patient has consented to  Procedure(s): PACEMAKER IMPLANT (N/A) as a surgical intervention .  The patient's history has been reviewed, patient examined, no change in status, stable for surgery.  I have reviewed the patient's chart and labs.  Questions were answered to the patient's satisfaction.     Melinda [name]

## 2017-08-04 NOTE — Progress Notes (Signed)
Lt Upper Arm Site Soft with no active bleeding or hematoma noted. VSS. Patient states no pain at this time. Patient to be transported to [facility]

## 2017-08-04 NOTE — Discharge Instructions (Signed)
° ° °  Supplemental Discharge Instructions for  Pacemaker/Defibrillator Patients  Activity No heavy lifting or vigorous activity with your left/right arm for 6 to 8 weeks.  Do not raise your left/right arm above your head for one week.  Gradually raise your affected arm as drawn below.              08/08/17                      08/09/17                      08/10/17                     08/11/17 __  NO DRIVING for  1 week  ; you may begin driving on  04/12/40  .  WOUND CARE - Keep the wound area clean and dry.  Do not get this area wet for one week. No showers for one week; you may shower on  08/11/17   . - The tape/steri-strips on your wound will fall off; do not pull them off.  No bandage is needed on the site.  DO  NOT apply any creams, oils, or ointments to the wound area. - If you notice any drainage or discharge from the wound, any swelling or bruising at the site, or you develop a fever > 101? F after you are discharged home, call the office at once.  Special Instructions - You are still able to use cellular telephones; use the ear opposite the side where you have your pacemaker/defibrillator.  Avoid carrying your cellular phone near your device. - When traveling through airports, show security personnel your identification card to avoid being screened in the metal detectors.  Ask the security personnel to use the hand wand. - Avoid arc welding equipment, MRI testing (magnetic resonance imaging), TENS units (transcutaneous nerve stimulators).  Call the office for questions about other devices. - Avoid electrical appliances that are in poor condition or are not properly grounded. - Microwave ovens are safe to be near or to operate.  Additional information for defibrillator patients should your device go off: - If your device goes off ONCE and you feel fine afterward, notify the device clinic nurses. - If your device goes off ONCE and you do not feel well afterward, call 911. - If your device goes  off TWICE, call 911. - If your device goes off THREE times in one day, call 911.  DO NOT DRIVE YOURSELF OR A FAMILY MEMBER WITH A DEFIBRILLATOR TO THE HOSPITAL--CALL 911.

## 2017-08-04 NOTE — Progress Notes (Signed)
Report and Care Received from [facility]. Patient resting comfortably at this time. States no pain. Lt upper arm site WNL. Will continue to monitor patient.

## 2017-08-05 ENCOUNTER — Ambulatory Visit (HOSPITAL_COMMUNITY): Payer: Medicare HMO

## 2017-08-05 ENCOUNTER — Other Ambulatory Visit: Payer: Self-pay

## 2017-08-05 DIAGNOSIS — I119 Hypertensive heart disease without heart failure: Secondary | ICD-10-CM | POA: Diagnosis not present

## 2017-08-05 DIAGNOSIS — I480 Paroxysmal atrial fibrillation: Secondary | ICD-10-CM | POA: Diagnosis not present

## 2017-08-05 DIAGNOSIS — I2510 Atherosclerotic heart disease of native coronary artery without angina pectoris: Secondary | ICD-10-CM | POA: Diagnosis not present

## 2017-08-05 DIAGNOSIS — I491 Atrial premature depolarization: Secondary | ICD-10-CM | POA: Diagnosis not present

## 2017-08-05 DIAGNOSIS — I495 Sick sinus syndrome: Secondary | ICD-10-CM | POA: Diagnosis not present

## 2017-08-05 DIAGNOSIS — E119 Type 2 diabetes mellitus without complications: Secondary | ICD-10-CM | POA: Diagnosis not present

## 2017-08-05 DIAGNOSIS — G4733 Obstructive sleep apnea (adult) (pediatric): Secondary | ICD-10-CM | POA: Diagnosis not present

## 2017-08-05 DIAGNOSIS — E7800 Pure hypercholesterolemia, unspecified: Secondary | ICD-10-CM | POA: Diagnosis not present

## 2017-08-05 DIAGNOSIS — I484 Atypical atrial flutter: Secondary | ICD-10-CM | POA: Diagnosis not present

## 2017-08-05 DIAGNOSIS — Z950 Presence of cardiac pacemaker: Secondary | ICD-10-CM | POA: Diagnosis not present

## 2017-08-05 DIAGNOSIS — I272 Pulmonary hypertension, unspecified: Secondary | ICD-10-CM | POA: Diagnosis not present

## 2017-08-05 MED ORDER — METOPROLOL SUCCINATE ER 50 MG PO TB24: 50.0000 mg | ORAL_TABLET | Freq: Every day | ORAL | 11 refills | Status: AC

## 2017-08-05 NOTE — Discharge Summary (Addendum)
ELECTROPHYSIOLOGY PROCEDURE DISCHARGE SUMMARY    Patient ID: Melinda [name],  MRN: [id number], DOB/AGE: 1931-12-21 84 y.o.  Admit date: 08/04/2017 Discharge date: 08/05/2017  Primary Care Physician: Crist Infante, MD  Primary Cardiologist: Dr. Martinique Electrophysiologist: Dr. Lovena Le  Primary Discharge Diagnosis:  1. Sick sinus syndrome  Secondary Discharge Diagnosis:  1. PAFlutter     CHA2DS2Vasc is 5, on Eliquis 2. HTN 3. HLD 4. OSA w/CPAP  Allergies  Allergen Reactions  . Lasix [Furosemide] Itching  . Phenobarbital Itching  . Adhesive [Tape] Other (See Comments)    SKIN WILL TEAR EASILY; PLEASE USE PAPER TAPE OR COBAN WRAP!!  . Amitiza [Lubiprostone] Itching     Procedures This Admission:  1.  Implantation of a SJM dual chamber PPM on 08/04/17 by Dr Lovena Le.   See full procedure note for details There were no immediate post procedure complications. 2.  CXR on 08/05/17 demonstrated no pneumothorax status post device implantation.   Brief HPI: Melinda [name] is a 84 y.o. female was referred to electrophysiology in the outpatient setting for consideration of PPM implantation.  Past medical history includes above.  The patient has has sick sinus syndrome.  Risks, benefits, and alternatives to PPM implantation were reviewed with the patient who wished to proceed.   Hospital Course:  The patient was admitted and underwent implantation of a PPM with details as outlined above.  She was monitored on telemetry overnight which demonstrated SR.  Left chest was without hematoma or ecchymosis.  The device was interrogated and found to be functioning normally.  CXR was obtained and demonstrated no pneumothorax status post device implantation.  Wound care, arm mobility, and restrictions were reviewed with the patient.  The patient feels well this morning, no CP or SOB, she was examined by Dr. Lovena Le and considered stable for discharge to home. We will start Toprol.  Eliquis to  resume Monday 08/08/17   Physical Exam: Vitals:   08/04/17 2352 08/05/17 0107 08/05/17 0443 08/05/17 0938  BP: (!) 171/93 (!) 146/83 (!) 142/89 111/75  Pulse: 94 96 97   Resp: 20  20   Temp: 97.9 F (36.6 C)  98.1 F (36.7 C)   TempSrc: Oral  Oral   SpO2: 93%  92%   Weight:   162 lb 6.4 oz (73.7 kg)   Height:        GEN- The patient is well appearing, alert and oriented x 3 today.   HEENT: normocephalic, atraumatic; sclera clear, conjunctiva pink; hearing intact; oropharynx clear; neck supple, no JVP Lungs- CTA b/l, normal work of breathing.  No wheezes, rales, rhonchi Heart- RRR, no murmurs, rubs or gallops, PMI not laterally displaced GI- soft, non-tender, non-distended, bowel sounds present Extremities- no clubbing, cyanosis, or edema MS- no significant deformity or atrophy Skin- warm and dry, no rash or lesion, left chest without hematoma/ecchymosis Psych- euthymic mood, full affect Neuro- no gross deficits   Labs:   Lab Results  Component Value Date   WBC 5.9 07/28/2017   HGB 13.6 07/28/2017   HCT 41.8 07/28/2017   MCV 97 07/28/2017   PLT 210 07/28/2017   No results for input(s): NA, K, CL, CO2, BUN, CREATININE, CALCIUM, PROT, BILITOT, ALKPHOS, ALT, AST, GLUCOSE in the last 168 hours.  Invalid input(s): LABALBU  Discharge Medications:  Allergies as of 08/05/2017      Reactions   Lasix [furosemide] Itching   Phenobarbital Itching   Adhesive [tape] Other (See Comments)   SKIN WILL TEAR  EASILY; PLEASE USE PAPER TAPE OR COBAN WRAP!!   Amitiza [lubiprostone] Itching      Medication List    TAKE these medications   acetaminophen 650 MG CR tablet Commonly known as:  TYLENOL Take 650-1,300 mg by mouth 2 (two) times daily as needed (for arthritis pain).   amiodarone 200 MG tablet Commonly known as:  PACERONE Take 1 tablet (200 mg total) by mouth daily.   apixaban 5 MG Tabs tablet Commonly known as:  ELIQUIS Take 1 tablet (5 mg total) by mouth 2 (two) times  daily. Notes to patient:  Resume Monday 08/08/17   atorvastatin 40 MG tablet Commonly known as:  LIPITOR Take 40 mg by mouth daily.   CALCIUM 600+D3 600-400 MG-UNIT Tabs Generic drug:  Calcium Carbonate-Vitamin D3 Take 1 tablet by mouth daily.   cholecalciferol 1000 units tablet Commonly known as:  VITAMIN D Take 1,000 Units by mouth daily.   desipramine 25 MG tablet Commonly known as:  NORPRAMIN Take 25 mg by mouth daily.   famotidine 20 MG tablet Commonly known as:  PEPCID Take 20 mg by mouth daily.   glucosamine-chondroitin 500-400 MG tablet Take 1 tablet by mouth every other day.   linaclotide 145 MCG Caps capsule Commonly known as:  LINZESS Take 145 mcg by mouth daily before breakfast.   LORazepam 1 MG tablet Commonly known as:  ATIVAN Take 2 mg by mouth at bedtime.   metoprolol succinate 50 MG 24 hr tablet Commonly known as:  TOPROL XL Take 1 tablet (50 mg total) by mouth daily. Take with or immediately following a meal.   OCUVITE PRESERVISION PO Take 1 tablet by mouth daily.   polyethylene glycol packet Commonly known as:  MIRALAX / GLYCOLAX Take 17 g by mouth at bedtime. Mix in 8 oz liquid and drink   potassium chloride 10 MEQ tablet Commonly known as:  K-DUR Take 10 mEq by mouth daily.   PRESCRIPTION MEDICATION CPAP with oxygen at bedtime   SYSTANE 0.4-0.3 % Gel ophthalmic gel Generic drug:  Polyethyl Glycol-Propyl Glycol Place 1 application into both eyes at bedtime.   torsemide 10 MG tablet Commonly known as:  DEMADEX Take 10 mg by mouth daily.   vitamin B-12 1000 MCG tablet Commonly known as:  CYANOCOBALAMIN Take 1,000 mcg by mouth daily.       Disposition:  Home  Discharge Instructions    Diet - low sodium heart healthy   Complete by:  As directed    Increase activity slowly   Complete by:  As directed      Follow-up Information    [place] [facility] Follow up on 08/16/2017.   Specialty:  Cardiology Why:  2:00PM,  wound check visit Contact information: [address], [address] [place]       Evans Lance, MD Follow up on 11/09/2017.   Specialty:  Cardiology Why:  2:15PM Contact information: [address] [address] [phone]           Duration of Discharge Encounter: Greater than 30 minutes including physician time.  Venetia Night, PA-C 08/05/2017 10:40 AM  EP attending  Patient seen and examined.  Agree with the findings as noted above.  Pacemaker interrogation under my direction demonstrates normal dual-chamber function.  Chest x-ray demonstrates no pneumothorax.  Her atrial lead has retracted slightly but is working normally.  She will be discharged home with usual follow-up.  She will hold  her systemic anticoagulation for a couple of days as to minimize the risk of postoperative bleeding.  [name], MD

## 2017-08-05 NOTE — Progress Notes (Signed)
Reviewed discharge instructions/medicatoins with patient. Answered all of her questions. Pt is stable and ready for discharge.

## 2017-08-05 NOTE — Consult Note (Signed)
   [facility] CM Inpatient Consult   08/05/2017  Melinda [name] [birth year] [id number]  Patient screened for potential [facility] services. Patient is in the [facility] services under patient's [facility] plan.  Met with the patient at the bedside.  She confirms her primary care provider, Dr. Crist Infante and this office provides the transition of care.  Patient is listed as Outpatient in bed for s/p Pacemaker. Patient was given the brochure and 24 hour nurse advise line.  She lives alone and feels well today.  Denies any needs but she does have medications changes.  She states she has a good relationship with her pharmacy:  [facility]. No needs. For questions contact:   Natividad Brood, RN BSN [facility] Liaison  [phone] business mobile phone Toll free office [phone]

## 2017-08-16 ENCOUNTER — Telehealth: Payer: Self-pay

## 2017-08-16 ENCOUNTER — Ambulatory Visit: Payer: Medicare HMO

## 2017-08-16 ENCOUNTER — Ambulatory Visit (INDEPENDENT_AMBULATORY_CARE_PROVIDER_SITE_OTHER): Payer: Medicare HMO | Admitting: *Deleted

## 2017-08-16 DIAGNOSIS — I495 Sick sinus syndrome: Secondary | ICD-10-CM | POA: Diagnosis not present

## 2017-08-16 LAB — CUP PACEART INCLINIC DEVICE CHECK
Battery Remaining Longevity: 79 mo
Battery Voltage: 3.01 V
Brady Statistic RA Percent Paced: 73 %
Date Time Interrogation Session: 20190611113014
Implantable Lead Implant Date: 20190530
Implantable Lead Location: 753860
Lead Channel Pacing Threshold Amplitude: 1 V
Lead Channel Pacing Threshold Amplitude: 1 V
Lead Channel Pacing Threshold Pulse Width: 0.5 ms
Lead Channel Pacing Threshold Pulse Width: 0.5 ms
Lead Channel Pacing Threshold Pulse Width: 0.5 ms
Lead Channel Setting Pacing Amplitude: 3.5 V
MDC IDC LEAD IMPLANT DT: 20190530
MDC IDC LEAD LOCATION: 753859
MDC IDC MSMT LEADCHNL RA IMPEDANCE VALUE: 450 Ohm
MDC IDC MSMT LEADCHNL RA SENSING INTR AMPL: 5 mV
MDC IDC MSMT LEADCHNL RV IMPEDANCE VALUE: 662.5 Ohm
MDC IDC MSMT LEADCHNL RV PACING THRESHOLD AMPLITUDE: 0.75 V
MDC IDC MSMT LEADCHNL RV PACING THRESHOLD AMPLITUDE: 0.75 V
MDC IDC MSMT LEADCHNL RV PACING THRESHOLD PULSEWIDTH: 0.5 ms
MDC IDC MSMT LEADCHNL RV SENSING INTR AMPL: 12 mV
MDC IDC PG IMPLANT DT: 20190530
MDC IDC SET LEADCHNL RA PACING AMPLITUDE: 3.5 V
MDC IDC SET LEADCHNL RV PACING PULSEWIDTH: 0.5 ms
MDC IDC SET LEADCHNL RV SENSING SENSITIVITY: 2 mV
MDC IDC STAT BRADY RV PERCENT PACED: 1.8 %
Pulse Gen Model: 2272
Pulse Gen Serial Number: 9022562

## 2017-08-16 NOTE — Telephone Encounter (Signed)
Called pt to see if she could come in at 11:00am for wound check instead of 2:00pm pt stated that she would have to check with the person that is bringing her, direct number to device clinic given for pt to call back,

## 2017-08-16 NOTE — Progress Notes (Signed)
Wound check appointment. Steri-strips removed. Wound without redness or edema. Incision edges approximated, wound well healed. Normal device function. Thresholds, sensing, and impedances consistent with implant measurements. Device programmed at 3.5V/auto capture programmed on for extra safety margin until 3 month visit. Histogram distribution appropriate for patient and level of activity. 28% AT/AF + Eliquis/Amio. No high ventricular rates noted. Patient educated about wound care, arm mobility, lifting restrictions. ROV 11/09/2017 w/ GT .

## 2017-08-17 DIAGNOSIS — E119 Type 2 diabetes mellitus without complications: Secondary | ICD-10-CM | POA: Diagnosis not present

## 2017-08-17 DIAGNOSIS — H353211 Exudative age-related macular degeneration, right eye, with active choroidal neovascularization: Secondary | ICD-10-CM | POA: Diagnosis not present

## 2017-08-17 DIAGNOSIS — H43813 Vitreous degeneration, bilateral: Secondary | ICD-10-CM | POA: Diagnosis not present

## 2017-08-17 DIAGNOSIS — H353121 Nonexudative age-related macular degeneration, left eye, early dry stage: Secondary | ICD-10-CM | POA: Diagnosis not present

## 2017-08-20 ENCOUNTER — Other Ambulatory Visit: Payer: Self-pay | Admitting: Physician Assistant

## 2017-08-21 ENCOUNTER — Other Ambulatory Visit: Payer: Self-pay | Admitting: Physician Assistant

## 2017-08-22 DIAGNOSIS — H26491 Other secondary cataract, right eye: Secondary | ICD-10-CM | POA: Diagnosis not present

## 2017-08-22 DIAGNOSIS — E119 Type 2 diabetes mellitus without complications: Secondary | ICD-10-CM | POA: Diagnosis not present

## 2017-08-22 DIAGNOSIS — H353211 Exudative age-related macular degeneration, right eye, with active choroidal neovascularization: Secondary | ICD-10-CM | POA: Diagnosis not present

## 2017-08-22 DIAGNOSIS — H52203 Unspecified astigmatism, bilateral: Secondary | ICD-10-CM | POA: Diagnosis not present

## 2017-08-25 ENCOUNTER — Emergency Department (HOSPITAL_COMMUNITY)
Admission: EM | Admit: 2017-08-25 | Discharge: 2017-08-25 | Disposition: A | Discharge status: Home / Self Care | Payer: Medicare HMO | Attending: Emergency Medicine | Admitting: Emergency Medicine

## 2017-08-25 ENCOUNTER — Encounter (HOSPITAL_COMMUNITY): Payer: Self-pay | Admitting: Student

## 2017-08-25 ENCOUNTER — Emergency Department (HOSPITAL_COMMUNITY): Payer: Medicare HMO

## 2017-08-25 ENCOUNTER — Other Ambulatory Visit: Payer: Self-pay

## 2017-08-25 DIAGNOSIS — Z79899 Other long term (current) drug therapy: Secondary | ICD-10-CM | POA: Diagnosis not present

## 2017-08-25 DIAGNOSIS — Y929 Unspecified place or not applicable: Secondary | ICD-10-CM | POA: Insufficient documentation

## 2017-08-25 DIAGNOSIS — E119 Type 2 diabetes mellitus without complications: Secondary | ICD-10-CM | POA: Insufficient documentation

## 2017-08-25 DIAGNOSIS — Z950 Presence of cardiac pacemaker: Secondary | ICD-10-CM | POA: Diagnosis not present

## 2017-08-25 DIAGNOSIS — I110 Hypertensive heart disease with heart failure: Secondary | ICD-10-CM | POA: Diagnosis not present

## 2017-08-25 DIAGNOSIS — R42 Dizziness and giddiness: Secondary | ICD-10-CM | POA: Diagnosis not present

## 2017-08-25 DIAGNOSIS — I4891 Unspecified atrial fibrillation: Secondary | ICD-10-CM | POA: Diagnosis not present

## 2017-08-25 DIAGNOSIS — Y9301 Activity, walking, marching and hiking: Secondary | ICD-10-CM | POA: Diagnosis not present

## 2017-08-25 DIAGNOSIS — I10 Essential (primary) hypertension: Secondary | ICD-10-CM | POA: Diagnosis not present

## 2017-08-25 DIAGNOSIS — W01198A Fall on same level from slipping, tripping and stumbling with subsequent striking against other object, initial encounter: Secondary | ICD-10-CM | POA: Insufficient documentation

## 2017-08-25 DIAGNOSIS — S0990XA Unspecified injury of head, initial encounter: Secondary | ICD-10-CM | POA: Diagnosis not present

## 2017-08-25 DIAGNOSIS — I5042 Chronic combined systolic (congestive) and diastolic (congestive) heart failure: Secondary | ICD-10-CM | POA: Diagnosis not present

## 2017-08-25 DIAGNOSIS — R0902 Hypoxemia: Secondary | ICD-10-CM | POA: Diagnosis not present

## 2017-08-25 DIAGNOSIS — Y999 Unspecified external cause status: Secondary | ICD-10-CM | POA: Diagnosis not present

## 2017-08-25 DIAGNOSIS — Z96641 Presence of right artificial hip joint: Secondary | ICD-10-CM | POA: Diagnosis not present

## 2017-08-25 DIAGNOSIS — S199XXA Unspecified injury of neck, initial encounter: Secondary | ICD-10-CM | POA: Diagnosis not present

## 2017-08-25 DIAGNOSIS — W19XXXA Unspecified fall, initial encounter: Secondary | ICD-10-CM | POA: Diagnosis not present

## 2017-08-25 LAB — BASIC METABOLIC PANEL
ANION GAP: 9 (ref 5–15)
BUN: 21 mg/dL — ABNORMAL HIGH (ref 6–20)
CHLORIDE: 103 mmol/L (ref 101–111)
CO2: 30 mmol/L (ref 22–32)
Calcium: 9 mg/dL (ref 8.9–10.3)
Creatinine, Ser: 1.04 mg/dL — ABNORMAL HIGH (ref 0.44–1.00)
GFR calc non Af Amer: 48 mL/min — ABNORMAL LOW (ref >60)
GFR, EST AFRICAN AMERICAN: 56 mL/min — AB (ref >60)
Glucose, Bld: 93 mg/dL (ref 65–99)
Potassium: 3.7 mmol/L (ref 3.5–5.1)
SODIUM: 142 mmol/L (ref 135–145)

## 2017-08-25 LAB — CBC
HEMATOCRIT: 38.8 % (ref 36.0–46.0)
HEMOGLOBIN: 12.3 g/dL (ref 12.0–15.0)
MCH: 31.1 pg (ref 26.0–34.0)
MCHC: 31.7 g/dL (ref 30.0–36.0)
MCV: 98.2 fL (ref 78.0–100.0)
Platelets: 171 10*3/uL (ref 150–400)
RBC: 3.95 MIL/uL (ref 3.87–5.11)
RDW: 13.9 % (ref 11.5–15.5)
WBC: 5.6 10*3/uL (ref 4.0–10.5)

## 2017-08-25 NOTE — Discharge Instructions (Addendum)
You were seen in the ER today for a fall. The CT scan of your head and neck did not show bleeding in your brain or fractures/dislocations of the neck. There were some degenerative changes that were not related to the fall you had today. Your lab work was re-assuring and appeared consistent with previous labs you have had done. Your pacemaker interrogation did not have any concerning findings.   Please follow up with your primary care provider regarding todays visit within 3-5 days. Return to the ER for new or worsening symptoms including but not limited to, additional falls, chest pain, trouble breathing, dizziness, passing out, or any other concerns.

## 2017-08-25 NOTE — ED Provider Notes (Signed)
[facility] Provider Note   CSN: [id number] Arrival date & time: 08/25/17  1318     History   Chief Complaint Chief Complaint  Patient presents with  . Fall    HPI Melinda [name] is a 84 y.o. female with a hx of OSA on CPAP, afib on Eliquis, anxiety, depression, DM, hypercholesterolemia, HF, pacemaker in place, and pulmonary HTN who presents to the ED s/p fall without significant complaints. Patient states she was walking throughout her house when she "staggered" and fell. She states that she hit her head on the door frame and fell to the ground. No LOC. She states she fell primarily onto the R hip/side. She was unable to get up without assistance but has been ambulatory since the fall. She states she came in because she hit her head and is on Eliquis, did have a mild headache after injury, but resolved at present. She states she is not in any pain from the fall, she does have chronic back pain that is unchanged. She states that she has had several falls over past several years- she states each time she "staggers" and this leads to loss of balance and a fall. She denies dizziness, lightheadedness, nausea, vomiting, dyspnea, or chest pain prior to fall or at present. Denies change in vision, numbness, or weakness prior to fall or at present. She has a hx of multifactorial gait disorder- she states this staggering and off balanceness leading to fall is similar to previous, she states she has been to physical therapy for this in the past, she does not feel off balance at present.    Triage note mentions dizziness- I clarified with patient multiple times,denies dizziness, no room spinning sensation, states it is that her gait "staggers" and she missteps leading to feeling more off balance leading to subsequent fall, none of these sxs at present.   HPI  Past Medical History:  Diagnosis Date  . A-fib ([place])   . Acid reflux   . Anxiety   . Arthritis     . Cataract    surgery,bilateral  . Constipation   . Depression   . Diabetes mellitus without complication (HCC)    Pre diabetes  . Dyspnea on exertion    a. 07/2015 Echo: EF 55-60%, no rwma, mild MR, mod dil LA/RA/RV, mod TR, triv PR, PASP 36mmHg.  . Falls   . Family history of adverse reaction to anesthesia    mother always had n/v  . Hx: UTI (urinary tract infection)   . Hypercholesterolemia   . Hypertensive heart disease   . Insomnia   . Multifactorial gait disorder   . Non-obstructive CAD    a. 07/2015 Cath: LM 30-40d, LAD min irregs, LCX min irregs, RCA 30p, RA 17, RV 33/7, PA 42/19, PCWP 17.  . On home oxygen therapy    "2L w/CPAP at night" (02/24/2017)  . Osteoarthritis    a. 06/2015 s/p R total hip arthroplasty.  [name] PAF (paroxysmal atrial fibrillation) ([place])    a. Dx 07/2015, CHA2DS2VASc = 6-->Eliquis.  . Premature atrial contractions   . Pulmonary hypertension ([place])    a. 07/2015 Echo: PASP 57mmHg.  [name] Pyloric antral stenosis    in childhood,infancy  . Sleep apnea with use of continuous positive airway pressure (CPAP) 10/19/2012   a. compliant w/ CPAP.    Patient Active Problem List   Diagnosis Date Noted  . Sinus node dysfunction ([place]) 08/04/2017  . Chronic combined systolic and diastolic  congestive heart failure ([place]) 07/21/2017  . Complex sleep apnea syndrome 07/21/2017  . Delirium due to another medical condition 07/21/2017  . Atrial flutter, paroxysmal ([place]) 07/21/2017  . Shock ([place]) 03/23/2017  . Atrial flutter ([place]) 03/22/2017  . Uncontrolled atrial fibrillation ([place]) 02/24/2017  . GERD (gastroesophageal reflux disease) 02/23/2017  . Diabetes ([place]) 02/23/2017  . Atypical atrial flutter ([place])   . Prolonged Q-T interval on ECG   . Pain in right hip 06/22/2016  . Encounter for monitoring sotalol therapy 05/31/2016  . Chest pain   . CHF exacerbation ([place]) 02/29/2016  . Acute respiratory failure with hypoxia ([place]) 02/29/2016  . Persistent atrial fibrillation  ([place])   . Hypertensive heart disease   . Hypercholesterolemia   . Pulmonary hypertension ([place])   . Dyspnea on exertion   . Non-obstructive CAD   . Chest pain, moderate coronary artery risk 07/14/2015  . Pain in the chest   . PAF (paroxysmal atrial fibrillation) (HCC)   . Pulmonary arterial hypertension ([place])   . Chest pain with moderate risk for cardiac etiology 07/08/2015  . Dyspnea 07/08/2015  . Essential hypertension 07/08/2015  . Hyperlipidemia 07/08/2015  . Osteoarthritis of right hip 06/26/2015  . Hip joint replacement status 06/26/2015  . OSA on CPAP 03/27/2015  . Ataxia 03/27/2015  . Falls 03/27/2015  . Insomnia, controlled 03/27/2015  . Insomnia with sleep apnea 03/22/2014  . Adjustment disorder with mixed anxiety and depressed mood 03/22/2014  . Sleep apnea with use of continuous positive airway pressure (CPAP) 10/19/2012  . Multifactorial gait disorder     Past Surgical History:  Procedure Laterality Date  . ABDOMINAL HYSTERECTOMY  1970  . ABDOMINAL SURGERY     as a 33 day old baby  . BREAST BIOPSY Left 2017  . CARDIAC CATHETERIZATION N/A 07/14/2015   Procedure: Right/Left Heart Cath and Coronary Angiography;  Surgeon: Sanda Klein, MD;  Location: [facility];  Service: Cardiovascular;  Laterality: N/A;  . CARDIOVERSION N/A 11/04/2015   Procedure: CARDIOVERSION;  Surgeon: Lelon Perla, MD;  Location[facility] ENDOSCOPY;  Service: Cardiovascular;  Laterality: N/A;  . CARDIOVERSION N/A 02/25/2016   Procedure: CARDIOVERSION;  Surgeon: Pixie Casino, MD;  Location: [facility] ENDOSCOPY;  Service: Cardiovascular;  Laterality: N/A;  . CARDIOVERSION N/A 12/13/2016   Procedure: CARDIOVERSION;  Surgeon: Fay Records, MD;  Location: [facility] ENDOSCOPY;  Service: Cardiovascular;  Laterality: N/A;  . CARDIOVERSION N/A 03/23/2017   Procedure: CARDIOVERSION;  Surgeon: Sueanne Margarita, MD;  Location[facility] ENDOSCOPY;  Service: Cardiovascular;  Laterality: N/A;  . COLONOSCOPY    . EYE  SURGERY Bilateral    cataract surgery with lens implant  . INNER EAR SURGERY Left    x 3  . JOINT REPLACEMENT Right   . LAPAROSCOPIC SALPINGO OOPHERECTOMY    . NM MYOVIEW LTD  11/30/2007   normal stress nuclear study/EF-83%  . PACEMAKER IMPLANT N/A 08/04/2017   Procedure: PACEMAKER IMPLANT;  Surgeon: Evans Lance, MD;  Location: [facility];  Service: Cardiovascular;  Laterality: N/A;  . TONSILLECTOMY    . TOTAL HIP ARTHROPLASTY Right 06/26/2015   Procedure: RIGHT TOTAL HIP ARTHROPLASTY ANTERIOR APPROACH;  Surgeon: Leandrew Koyanagi, MD;  Location: [facility];  Service: Orthopedics;  Laterality: Right;  . TRANSTHORACIC ECHOCARDIOGRAM  07/23/2009   mild concentric LVH/mild mitral insufficiency/ moderate tricuspid innsufficiency/mild pulmonary HTN/EF- 55-60%  . VAGINAL DELIVERY       OB History   None      Home Medications  Prior to Admission medications   Medication Sig Start Date End Date Taking? Authorizing Provider  acetaminophen (TYLENOL) 650 MG CR tablet Take 650-1,300 mg by mouth 2 (two) times daily as needed (for arthritis pain).     [provider]  amiodarone (PACERONE) 200 MG tablet Take 1 tablet (200 mg total) by mouth daily. 05/31/17 05/31/18  Almyra Deforest, PA  amLODipine (NORVASC) 5 MG tablet TAKE 1 TABLET BY MOUTH EVERY DAY 08/22/17   [place], Peter M, MD  apixaban (ELIQUIS) 5 MG TABS tablet Take 1 tablet (5 mg total) by mouth 2 (two) times daily. 07/15/15   Robbie Lis, MD  atorvastatin (LIPITOR) 40 MG tablet Take 40 mg by mouth daily.     [provider]  Calcium Carbonate-Vitamin D3 (CALCIUM 600+D3) 600-400 MG-UNIT TABS Take 1 tablet by mouth daily.    [provider]  cholecalciferol (VITAMIN D) 1000 UNITS tablet Take 1,000 Units by mouth daily.     [provider]  desipramine (NORPRAMIN) 25 MG tablet Take 25 mg by mouth daily.    [provider]  famotidine (PEPCID) 20 MG tablet Take 20 mg by mouth daily.    [provider]  glucosamine-chondroitin 500-400 MG tablet Take 1 tablet by mouth every other day.     [provider]  linaclotide (LINZESS) 145 MCG CAPS capsule Take 145 mcg by mouth daily before breakfast.     [provider]  LORazepam (ATIVAN) 1 MG tablet Take 2 mg by mouth at bedtime.  04/03/17   [provider]  metoprolol succinate (TOPROL XL) 50 MG 24 hr tablet Take 1 tablet (50 mg total) by mouth daily. Take with or immediately following a meal. 08/05/17 08/05/18  Baldwin Jamaica, PA-C  Multiple Vitamins-Minerals (OCUVITE PRESERVISION PO) Take 1 tablet by mouth daily.    [provider]  Polyethyl Glycol-Propyl Glycol (SYSTANE) 0.4-0.3 % GEL ophthalmic gel Place 1 application into both eyes at bedtime.     [provider]  polyethylene glycol (MIRALAX / GLYCOLAX) packet Take 17 g by mouth at bedtime. Mix in 8 oz liquid and drink    [provider]  potassium chloride (K-DUR) 10 MEQ tablet Take 10 mEq by mouth daily.    [provider]  potassium chloride (K-DUR) 10 MEQ tablet TAKE 2 TABLETS BY MOUTH TWICE A DAY . 08/22/17   [place], Peter M, MD  PRESCRIPTION MEDICATION CPAP with oxygen at bedtime    [provider]  torsemide (DEMADEX) 10 MG tablet Take 10 mg by mouth daily.    [provider]  vitamin B-12 (CYANOCOBALAMIN) 1000 MCG tablet Take 1,000 mcg by mouth daily.    [provider]    Family History Family History  Problem Relation Age of Onset  . Heart attack Father   . Heart failure Mother   . Cancer Mother        colon,kidney  . Diabetes Sister   . Hypertension Sister   . Heart failure Sister     Social History Social History   Tobacco Use  . Smoking status: Never Smoker  . Smokeless tobacco: Never Used  Substance Use Topics  . Alcohol use: No    Alcohol/week: 0.0 oz    Comment: rare  . Drug use: No     Allergies   Lasix [furosemide]; Phenobarbital; Adhesive [tape]; and  Amitiza [lubiprostone]   Review of Systems Review of Systems  Constitutional: Negative for chills and fever.  Eyes: Negative  for visual disturbance.  Respiratory: Negative for shortness of breath.   Cardiovascular: Negative for chest pain.  Gastrointestinal: Negative for anal bleeding, blood in stool, constipation, diarrhea, nausea and vomiting.  Musculoskeletal: Positive for back pain (chronic unchanged) and gait problem ("staggered then off balance", resolved at present, has happened previously).  Skin: Positive for wound (head).  Neurological: Positive for headaches (after fall, resolved at present). Negative for dizziness, seizures, syncope, weakness and numbness.    Physical Exam Updated Vital Signs There were no vitals taken for this visit.  Physical Exam  Constitutional: She appears well-developed and well-nourished.  Non-toxic appearance. No distress.  HENT:  Head: Head is with contusion (R frontal scalp approximately 2cm in diameter with two small overlying very superficial lacerations each of which are 3-4 mm in size, pictured below. No active bleeding). Head is without raccoon's eyes and without Battle's sign.  Right Ear: Tympanic membrane normal. No drainage. No hemotympanum.  Left Ear: Tympanic membrane normal. No drainage. No hemotympanum.  Nose: Nose normal. No rhinorrhea.  Mouth/Throat: Uvula is midline and oropharynx is clear and moist.  Eyes: Pupils are equal, round, and reactive to light. Conjunctivae and EOM are normal. Right eye exhibits no discharge. Left eye exhibits no discharge.  Neck: Normal range of motion. Neck supple. No spinous process tenderness present.  Cardiovascular: Normal rate and regular rhythm.  No murmur heard. Pulmonary/Chest: Breath sounds normal. No respiratory distress. She has no wheezes. She has no rhonchi. She has no rales.  Abdominal: Soft. She exhibits no distension. There is no tenderness.  Musculoskeletal:  No obvious deformity,  appreciable swelling, erythema, ecchymosis, open wounds (other than head), or overlying warmth.  Upper extremities: Normal range of motion bilateral shoulders, elbows, and wrists.  Nontender Lower extremities: Normal range of motion to bilateral hips, knees, and ankles..  Nontender. Back: Patient diffusely tender to the lower back, no point/focal vertebral tenderness, she states this is her baseline discomfort, unchanged after fall.  Neurological:  Alert.  Clear speech.  No facial droop.  CN III through XII grossly intact.  Sensation grossly intact bilateral upper and lower extremities: Patient has 5 out of 5 symmetric grip strength.  She is 5 out of 5 strength with plantar dorsiflexion bilaterally, she is able to lift both of her legs off the bed without difficulty.  Negative pronator drift.  Normal finger-nose bilaterally.  Negative Romberg.  Steady gait.  Skin: Skin is warm and dry. No rash noted.  Psychiatric: She has a normal mood and affect. Her behavior is normal.  Nursing note and vitals reviewed.      ED Treatments / Results  Labs (all labs ordered are listed, but only abnormal results are displayed) Labs Reviewed - No data to display  EKG None  Radiology Ct Head Wo Contrast  Result Date: 08/25/2017 CLINICAL DATA:  84 year old female status post dizziness and fall into [place] cabinet. Right head laceration. Recently placed ICD. EXAM: CT HEAD WITHOUT CONTRAST CT CERVICAL SPINE WITHOUT CONTRAST TECHNIQUE: Multidetector CT imaging of the head and cervical spine was performed following the standard protocol without intravenous contrast. Multiplanar CT image reconstructions of the cervical spine were also generated. COMPARISON:  Head and cervical spine CT 05/07/2016. FINDINGS: CT HEAD FINDINGS Brain: Stable cerebral volume. No midline shift, ventriculomegaly, mass effect, evidence of mass lesion, intracranial hemorrhage or evidence of cortically based acute infarction. A small hypodense  focus in the central left thalamus is new or increased since 2018 on series 4, image 14. Elsewhere gray-white  matter differentiation is stable and within normal limits for age. No cortical encephalomalacia identified. Vascular: Calcified atherosclerosis at the skull base. No suspicious intracranial vascular hyperdensity. Skull: Intact. Sinuses/Orbits: Resolved left maxillary fluid level seen in 2018. Visible Visualized paranasal sinuses and mastoids are clear. Other: Negative orbits soft tissues. No discrete scalp injury is evident. CT CERVICAL SPINE FINDINGS Alignment: Chronic straightening of cervical lordosis and anterolisthesis of C2 on C3 appears stable. Multilevel mild anterolisthesis also in the visible upper thoracic spine. Bilateral posterior element alignment is within normal limits. Skull base and vertebrae: Visualized skull base is intact. No atlanto-occipital dissociation. No cervical spine fracture identified. Soft tissues and spinal canal: No prevertebral fluid or swelling. No visible canal hematoma. Bilateral calcified cervical carotid atherosclerosis. Disc levels: Severe chronic cervical spine degeneration appears stable. Acquired interbody ankylosis suspected from C5-C7 with vacuum disc elsewhere. Widespread bulky endplate spurring. Occasional severe facet hypertrophy. Upper chest: Chronic appearing mild T4 superior endplate compression fracture. The visible upper thoracic levels otherwise appear intact. New left subclavian approach cardiac pacer or ICD lead since 2018. Calcified aortic atherosclerosis. Otherwise negative visible noncontrast mediastinum. Respiratory motion in the upper lungs which appear stable. IMPRESSION: 1. Progressed small vessel ischemia in the left thalamus since 2018. Otherwise stable non contrast CT appearance of the brain. 2. No acute traumatic injury identified in the head or cervical spine. 3. Chronic severe cervical spine degeneration. Electronically Signed   By: Genevie Ann M.D.   On: 08/25/2017 15:48   Ct Cervical Spine Wo Contrast  Result Date: 08/25/2017 CLINICAL DATA:  84 year old female status post dizziness and fall into [place] cabinet. Right head laceration. Recently placed ICD. EXAM: CT HEAD WITHOUT CONTRAST CT CERVICAL SPINE WITHOUT CONTRAST TECHNIQUE: Multidetector CT imaging of the head and cervical spine was performed following the standard protocol without intravenous contrast. Multiplanar CT image reconstructions of the cervical spine were also generated. COMPARISON:  Head and cervical spine CT 05/07/2016. FINDINGS: CT HEAD FINDINGS Brain: Stable cerebral volume. No midline shift, ventriculomegaly, mass effect, evidence of mass lesion, intracranial hemorrhage or evidence of cortically based acute infarction. A small hypodense focus in the central left thalamus is new or increased since 2018 on series 4, image 14. Elsewhere gray-white matter differentiation is stable and within normal limits for age. No cortical encephalomalacia identified. Vascular: Calcified atherosclerosis at the skull base. No suspicious intracranial vascular hyperdensity. Skull: Intact. Sinuses/Orbits: Resolved left maxillary fluid level seen in 2018. Visible Visualized paranasal sinuses and mastoids are clear. Other: Negative orbits soft tissues. No discrete scalp injury is evident. CT CERVICAL SPINE FINDINGS Alignment: Chronic straightening of cervical lordosis and anterolisthesis of C2 on C3 appears stable. Multilevel mild anterolisthesis also in the visible upper thoracic spine. Bilateral posterior element alignment is within normal limits. Skull base and vertebrae: Visualized skull base is intact. No atlanto-occipital dissociation. No cervical spine fracture identified. Soft tissues and spinal canal: No prevertebral fluid or swelling. No visible canal hematoma. Bilateral calcified cervical carotid atherosclerosis. Disc levels: Severe chronic cervical spine degeneration appears stable.  Acquired interbody ankylosis suspected from C5-C7 with vacuum disc elsewhere. Widespread bulky endplate spurring. Occasional severe facet hypertrophy. Upper chest: Chronic appearing mild T4 superior endplate compression fracture. The visible upper thoracic levels otherwise appear intact. New left subclavian approach cardiac pacer or ICD lead since 2018. Calcified aortic atherosclerosis. Otherwise negative visible noncontrast mediastinum. Respiratory motion in the upper lungs which appear stable. IMPRESSION: 1. Progressed small vessel ischemia in the left thalamus since 2018. Otherwise stable non contrast  CT appearance of the brain. 2. No acute traumatic injury identified in the head or cervical spine. 3. Chronic severe cervical spine degeneration. Electronically Signed   By: Genevie Ann M.D.   On: 08/25/2017 15:48    Procedures Procedures (including critical care time)  Medications Ordered in ED Medications - No data to display   Initial Impression / Assessment and Plan / ED Course  I have reviewed the triage vital signs and the nursing notes.  Pertinent labs & imaging results that were available during my care of the patient were reviewed by me and considered in my medical decision making (see chart for details).  Clinical Course as of Aug 26 1510  Thu Jun 20, 911  668 84 year old female history of A. fib on anticoagulation pacemaker here after a mechanical fall in which she struck her head.  She denies LOC and really has no complaints but wanted to get checked out because of the blood thinner.  No chest pain no shortness of breath no pain in her extremities.  She is getting a head CT and evaluation for a wound on her right temple area if it needs to be repaired.   [MB]    Clinical Course User Index [MB] Hayden Rasmussen, MD    Patient on anticoagulation who presents to the emergency department status post fall with head injury, no LOC. She is somewhat hypertensive, doubt HTN emergency,  patient aware of need for recheck.  Patient has a fairly benign physical exam, she does have a contusion with very superficial small lacerations that do not appear to require closure to  the right side of her frontal scalp. Given she had a head injury and is on Eliquis will obtain head CT and cervical spine CT. Otherwise reassuring physical exam.  Patient has no focal neurologic deficits.  She has no point vertebral tenderness.  Extremities with normal range of motion, nontender.    Patient has history of similar gait problems leading to falls, she is not having any lightheadedness, dizziness, chest pain, or difficulty breathing prior to fall or at present, description of event does not seem consistent with syncope or near syncope.  She is not persistently off balance, her gait is steady and intact on my evaluation. Pacemaker interrogation without concerning arrhythmias. CT head/neck without acute traumatic injury. CBC without abnormalities, patient signed out at change of shift to [employer] PA-C pending remaining labs. Plan for discharge home pending BMP appears at baseline. Patient has been hemodynamically stable throughout ER visit. I discussed results thus far and treatment plan with patient and her daughter at bedside who confirmed understanding.   Findings and plan of care discussed with supervising physician Dr. Melina Copa who personally evaluated and examined this patient and is in agreement with plan.    Final Clinical Impressions(s) / ED Diagnoses   Final diagnoses:  Fall, initial encounter  Injury of head, initial encounter    ED Discharge Orders    None       Amaryllis Dyke, PA-C 08/25/17 1646    Hayden Rasmussen, MD 08/26/17 [phone]

## 2017-08-25 NOTE — ED Notes (Signed)
Patient transported to ct

## 2017-08-25 NOTE — ED Triage Notes (Signed)
Pt arrived via [facility] from home after she became dizzy while walking and fell into a [place] cabinet. Pt has small laceration to right side of head, bleeding controlled. Pt had recently placed  ICD. Pt denies other complaints at this time. VSS at triage.

## 2017-08-31 DIAGNOSIS — R69 Illness, unspecified: Secondary | ICD-10-CM | POA: Diagnosis not present

## 2017-09-01 DIAGNOSIS — G4733 Obstructive sleep apnea (adult) (pediatric): Secondary | ICD-10-CM | POA: Diagnosis not present

## 2017-09-01 DIAGNOSIS — I5043 Acute on chronic combined systolic (congestive) and diastolic (congestive) heart failure: Secondary | ICD-10-CM | POA: Diagnosis not present

## 2017-09-01 DIAGNOSIS — R0902 Hypoxemia: Secondary | ICD-10-CM | POA: Diagnosis not present

## 2017-09-03 ENCOUNTER — Other Ambulatory Visit: Payer: Self-pay | Admitting: Physician Assistant

## 2017-09-16 ENCOUNTER — Other Ambulatory Visit: Payer: Self-pay | Admitting: Physician Assistant

## 2017-10-01 DIAGNOSIS — R0902 Hypoxemia: Secondary | ICD-10-CM | POA: Diagnosis not present

## 2017-10-01 DIAGNOSIS — G4733 Obstructive sleep apnea (adult) (pediatric): Secondary | ICD-10-CM | POA: Diagnosis not present

## 2017-10-01 DIAGNOSIS — I5043 Acute on chronic combined systolic (congestive) and diastolic (congestive) heart failure: Secondary | ICD-10-CM | POA: Diagnosis not present

## 2017-10-12 DIAGNOSIS — H353211 Exudative age-related macular degeneration, right eye, with active choroidal neovascularization: Secondary | ICD-10-CM | POA: Diagnosis not present

## 2017-10-17 DIAGNOSIS — R69 Illness, unspecified: Secondary | ICD-10-CM | POA: Diagnosis not present

## 2017-11-01 DIAGNOSIS — G4733 Obstructive sleep apnea (adult) (pediatric): Secondary | ICD-10-CM | POA: Diagnosis not present

## 2017-11-01 DIAGNOSIS — I5043 Acute on chronic combined systolic (congestive) and diastolic (congestive) heart failure: Secondary | ICD-10-CM | POA: Diagnosis not present

## 2017-11-01 DIAGNOSIS — R0902 Hypoxemia: Secondary | ICD-10-CM | POA: Diagnosis not present

## 2017-11-09 ENCOUNTER — Encounter: Payer: Self-pay | Admitting: Internal Medicine

## 2017-11-09 ENCOUNTER — Ambulatory Visit: Payer: Medicare HMO | Admitting: Internal Medicine

## 2017-11-09 VITALS — BP 128/74 | HR 60 | Ht 59.0 in | Wt 166.2 lb

## 2017-11-09 DIAGNOSIS — I480 Paroxysmal atrial fibrillation: Secondary | ICD-10-CM

## 2017-11-09 DIAGNOSIS — I484 Atypical atrial flutter: Secondary | ICD-10-CM | POA: Diagnosis not present

## 2017-11-09 DIAGNOSIS — Z950 Presence of cardiac pacemaker: Secondary | ICD-10-CM | POA: Diagnosis not present

## 2017-11-09 DIAGNOSIS — I495 Sick sinus syndrome: Secondary | ICD-10-CM

## 2017-11-09 NOTE — Patient Instructions (Addendum)
Medication Instructions:  Your physician recommends that you continue on your current medications as directed. Please refer to the Current Medication list given to you today.  Labwork: None ordered.  Testing/Procedures: None ordered.  Follow-Up: Your physician wants you to follow-up in: 9 months with Dr. Lovena Le.   You will receive a reminder letter in the mail two months in advance. If you don't receive a letter, please call our office to schedule the follow-up appointment.  Remote monitoring is used to monitor your Pacemaker from home. This monitoring reduces the number of office visits required to check your device to one time per year. It allows [place] to keep an eye on the functioning of your device to ensure it is working properly. You are scheduled for a device check from home on 02/08/2018. You may send your transmission at any time that day. If you have a wireless device, the transmission will be sent automatically. After your physician reviews your transmission, you will receive a postcard with your next transmission date.  Any Other Special Instructions Will Be Listed Below (If Applicable).  If you need a refill on your cardiac medications before your next appointment, please call your pharmacy.

## 2017-11-09 NOTE — Progress Notes (Signed)
HPI Melinda [name] returns today for ongoing evaluation and management of sinus node dysfunction and PAF, s/p PPM insertion. She feels well and notes more energy since her PPM was inserted. No chest pain or sob. No syncope. She has maintained NSR on low dose amiodarone. Minimal peripheral edema. Allergies  Allergen Reactions  . Lasix [Furosemide] Itching  . Phenobarbital Itching  . Adhesive [Tape] Other (See Comments)    SKIN WILL TEAR EASILY; PLEASE USE PAPER TAPE OR COBAN WRAP!!  . Amitiza [Lubiprostone] Itching     Current Outpatient Medications  Medication Sig Dispense Refill  . acetaminophen (TYLENOL) 650 MG CR tablet Take 650 mg by mouth daily as needed (arthritis pain).     [name] amiodarone (PACERONE) 200 MG tablet Take 1 tablet (200 mg total) by mouth daily. 180 tablet 3  . amLODipine (NORVASC) 5 MG tablet TAKE 1 TABLET BY MOUTH EVERY DAY 30 tablet 9  . amoxicillin (AMOXIL) 500 MG capsule Take 2,000 mg by mouth See admin instructions. Take 4 capsules (2000 mg) by mouth one hour prior to dental appointments    . apixaban (ELIQUIS) 5 MG TABS tablet Take 1 tablet (5 mg total) by mouth 2 (two) times daily. 60 tablet 0  . atorvastatin (LIPITOR) 40 MG tablet Take 40 mg by mouth daily.     . Calcium Carbonate-Vitamin D3 (CALCIUM 600+D3) 600-400 MG-UNIT TABS Take 1 tablet by mouth daily.    . cholecalciferol (VITAMIN D) 1000 UNITS tablet Take 1,000 Units by mouth daily.     [name] desipramine (NORPRAMIN) 25 MG tablet Take 25 mg by mouth daily.    . famotidine (PEPCID) 20 MG tablet Take 20 mg by mouth daily.    [name] glucosamine-chondroitin 500-400 MG tablet Take 1 tablet by mouth every other day.     . linaclotide (LINZESS) 145 MCG CAPS capsule Take 145 mcg by mouth daily before breakfast.     . LORazepam (ATIVAN) 0.5 MG tablet Take 1 mg by mouth at bedtime.  5  . metoprolol succinate (TOPROL XL) 50 MG 24 hr tablet Take 1 tablet (50 mg total) by mouth daily. Take with or immediately  following a meal. (Patient taking differently: Take 50 mg by mouth daily with lunch. Take with or immediately following a meal.) 30 tablet 11  . Multiple Vitamins-Minerals (OCUVITE PRESERVISION PO) Take 1 tablet by mouth daily.    [name] Glycol-Propyl Glycol (SYSTANE) 0.4-0.3 % GEL ophthalmic gel Place 1 application into both eyes at bedtime.     . polyethylene glycol (MIRALAX / GLYCOLAX) packet Take 17 g by mouth at bedtime. Mix in 8 oz liquid and drink    . potassium chloride (K-DUR) 10 MEQ tablet TAKE 2 TABLETS BY MOUTH TWICE A DAY . (Patient taking differently: TAKE 2 TABLETS BY MOUTH DAILY) 120 tablet 3  . PRESCRIPTION MEDICATION CPAP with oxygen at bedtime    . torsemide (DEMADEX) 10 MG tablet Take 10 mg by mouth daily.    . vitamin B-12 (CYANOCOBALAMIN) 1000 MCG tablet Take 1,000 mcg by mouth daily.     No current facility-administered medications for this visit.      Past Medical History:  Diagnosis Date  . A-fib ([place])   . Acid reflux   . Anxiety   . Arthritis   . Cataract    surgery,bilateral  . Constipation   . Depression   . Diabetes mellitus without complication (HCC)    Pre diabetes  . Dyspnea on exertion  a. 07/2015 Echo: EF 55-60%, no rwma, mild MR, mod dil LA/RA/RV, mod TR, triv PR, PASP 33mmHg.  . Falls   . Family history of adverse reaction to anesthesia    mother always had n/v  . Hx: UTI (urinary tract infection)   . Hypercholesterolemia   . Hypertensive heart disease   . Insomnia   . Multifactorial gait disorder   . Non-obstructive CAD    a. 07/2015 Cath: LM 30-40d, LAD min irregs, LCX min irregs, RCA 30p, RA 17, RV 33/7, PA 42/19, PCWP 17.  . On home oxygen therapy    "2L w/CPAP at night" (02/24/2017)  . Osteoarthritis    a. 06/2015 s/p R total hip arthroplasty.  [name] PAF (paroxysmal atrial fibrillation) ([place])    a. Dx 07/2015, CHA2DS2VASc = 6-->Eliquis.  . Premature atrial contractions   . Pulmonary hypertension ([place])    a. 07/2015 Echo: PASP 3mmHg.   [name] Pyloric antral stenosis    in childhood,infancy  . Sleep apnea with use of continuous positive airway pressure (CPAP) 10/19/2012   a. compliant w/ CPAP.    ROS:   All systems reviewed and negative except as noted in the HPI.   Past Surgical History:  Procedure Laterality Date  . ABDOMINAL HYSTERECTOMY  1970  . ABDOMINAL SURGERY     as a 38 day old baby  . BREAST BIOPSY Left 2017  . CARDIAC CATHETERIZATION N/A 07/14/2015   Procedure: Right/Left Heart Cath and Coronary Angiography;  Surgeon: Sanda Klein, MD;  Location: [place] [facility];  Service: Cardiovascular;  Laterality: N/A;  . CARDIOVERSION N/A 11/04/2015   Procedure: CARDIOVERSION;  Surgeon: Lelon Perla, MD;  Location: [facility] ENDOSCOPY;  Service: Cardiovascular;  Laterality: N/A;  . CARDIOVERSION N/A 02/25/2016   Procedure: CARDIOVERSION;  Surgeon: Pixie Casino, MD;  Location: [facility] ENDOSCOPY;  Service: Cardiovascular;  Laterality: N/A;  . CARDIOVERSION N/A 12/13/2016   Procedure: CARDIOVERSION;  Surgeon: Fay Records, MD;  Location: [facility] ENDOSCOPY;  Service: Cardiovascular;  Laterality: N/A;  . CARDIOVERSION N/A 03/23/2017   Procedure: CARDIOVERSION;  Surgeon: Sueanne Margarita, MD;  Location: [facility] ENDOSCOPY;  Service: Cardiovascular;  Laterality: N/A;  . COLONOSCOPY    . EYE SURGERY Bilateral    cataract surgery with lens implant  . INNER EAR SURGERY Left    x 3  . JOINT REPLACEMENT Right   . LAPAROSCOPIC SALPINGO OOPHERECTOMY    . NM MYOVIEW LTD  11/30/2007   normal stress nuclear study/EF-83%  . PACEMAKER IMPLANT N/A 08/04/2017   Procedure: PACEMAKER IMPLANT;  Surgeon: Evans Lance, MD;  Location: [place] [facility];  Service: Cardiovascular;  Laterality: N/A;  . TONSILLECTOMY    . TOTAL HIP ARTHROPLASTY Right 06/26/2015   Procedure: RIGHT TOTAL HIP ARTHROPLASTY ANTERIOR APPROACH;  Surgeon: Leandrew Koyanagi, MD;  Location: [place];  Service: Orthopedics;  Laterality: Right;  . TRANSTHORACIC ECHOCARDIOGRAM  07/23/2009   mild  concentric LVH/mild mitral insufficiency/ moderate tricuspid innsufficiency/mild pulmonary HTN/EF- 55-60%  . VAGINAL DELIVERY       Family History  Problem Relation Age of Onset  . Heart attack Father   . Heart failure Mother   . Cancer Mother        colon,kidney  . Diabetes Sister   . Hypertension Sister   . Heart failure Sister      Social History   Socioeconomic History  . Marital status: Widowed    Spouse name: Not on file  . Number of children: 1  .  Years of education: 72  . Highest education level: Not on file  Occupational History  . Occupation: Retired  Scientific laboratory technician  . Financial resource strain: Not on file  . Food insecurity:    Worry: Not on file    Inability: Not on file  . Transportation needs:    Medical: Not on file    Non-medical: Not on file  Tobacco Use  . Smoking status: Never Smoker  . Smokeless tobacco: Never Used  Substance and Sexual Activity  . Alcohol use: No    Alcohol/week: 0.0 standard drinks    Comment: rare  . Drug use: No  . Sexual activity: Not on file  Lifestyle  . Physical activity:    Days per week: Not on file    Minutes per session: Not on file  . Stress: Not on file  Relationships  . Social connections:    Talks on phone: Not on file    Gets together: Not on file    Attends religious service: Not on file    Active member of club or organization: Not on file    Attends meetings of clubs or organizations: Not on file    Relationship status: Not on file  . Intimate partner violence:    Fear of current or ex partner: Not on file    Emotionally abused: Not on file    Physically abused: Not on file    Forced sexual activity: Not on file  Other Topics Concern  . Not on file  Social History Narrative   Patient lives alone.    Patient is widowed.    Patient retired.    Patient some college.    Patient has one child.    Caffeine 1-2 cups daily avg.     BP 128/74   Pulse 60   Ht 4\' 11"  (1.499 m)   Wt 166 lb 3.2 oz  (75.4 kg)   SpO2 93%   BMI 33.57 kg/m   Physical Exam:  Well appearing 84 yo woman, NAD HEENT: Unremarkable Neck:  6 cm JVD, no thyromegally Lymphatics:  No adenopathy Back:  No CVA tenderness Lungs:  Clear with no wheezes HEART:  Regular rate rhythm, no murmurs, no rubs, no clicks Abd:  soft, positive bowel sounds, no organomegally, no rebound, no guarding Ext:  2 plus pulses, no edema, no cyanosis, no clubbing Skin:  No rashes no nodules Neuro:  CN II through XII intact, motor grossly intact  EKG - NSR with atrial pacing  DEVICE  Normal device function.  See PaceArt for details.   Assess/Plan: 1. Sinus node dysfunction - she is doing well after her PPM insertion. She notes more energy since her device was placed. 2. PAF - she is maintaining NSR very nicely. She will continue low dose amio. 3. PPM - her [employer] DDD PM is working normally.   Mikle [email or web address].D.

## 2017-11-11 NOTE — Progress Notes (Signed)
Cardiology Office Note    Date:  11/14/2017   ID:  Melinda [name], DOB [birth year], MRN [id number]  PCP:  Crist Infante, MD  Cardiologist:  Dr. Martinique  Chief Complaint  Patient presents with  . Atrial Fibrillation    History of Present Illness:  Melinda [name] is a 84 y.o. female with PMH of DM II, HTN, HLD, OSA on CPAP followed by Dr. Brett Fairy, pulmonary HTN, CAD, recurrent PAF and atypical atrial flutter.  She was seen in March 2017 for increased dyspnea on exertion.  Repeat echocardiogram and Myoview were scheduled, however she ended up admitted to the hospital was acute chest pain prior to those test being down.  She was found to be in atrial flutter with controlled heart rate.  Echocardiogram at the time showed normal LV function, mild LVH, mild MR, moderate LAE, RA, RV enlargement with pulmonary hypertension estimated at 50 mmHg.  She underwent left and right heart cath that showed nonobstructive CAD, mild pulmonary hypertension more consistent with pulmonary arterial hypertension.  CT angiogram of the chest showed no evidence of PE.  Subsequent PFTs as outpatient showed normal volume with mild obstruction and a decreased diffusion capacity.  She was started on diuretic therapy and systemic anticoagulation.  She underwent successful DCCV in August 2017.  By November 2017, she had a recurrent atrial fibrillation, beta-blocker was increased.  She again underwent DCCV in September 2017.  She was admitted 4 days after that for acute respiratory distress.  Questionable left lower lobe pneumonia, treated with antibiotic and gentle diuresis.  Echocardiogram showed normal LV function severe biatrial enlargement, pulmonary hypertension.  She had a recurrent atrial fibrillation in January 2018.  She was not a candidate for Tikosyn due to cost.  She was placed on sotalol 160mg  BID in March 2018.  In September 2018, she had symptoms of recurrent tachycardia and fatigue and was found to get atrial  flutter versus atrial tachycardia.  Beta-blocker was increased with improvement and she eventually underwent successful DC cardioversion in October 2018.  She had a recurrent atypical atrial flutter with 2-1 AV conduction in December 2018 and was seen by Dr. Lovena Le. Sotalol was discontinued and she was started on amiodarone.  She was admitted in January 2019 for chest pain and ruled out of MI, she underwent DC cardioversion for atrial flutter successfully.  Post cardioversion, she had bradycardia and profound hypotension requiring pressors.  Metoprolol was held and it was eventually resumed at a low dose.  She was last seen by Dr. Martinique on 04/20/2017, she did have some significant memory issues for 2 weeks following her DC cardioversion.  Based on recent telephone note on 05/22/2017, she was complaining of some bradycardia and the dyspnea with exertion.  Her metoprolol was reduced from 25 mg twice daily down to 12.5 mg twice daily.   When seen in May she had persistent dyspnea and fatigue with marked bradycardia. She was seen by Dr. Lovena Le and underwent placement of a DDD pacemaker. She was maintained on amiodarone for Afib/flutter. When seen September 3 she was maintaining NSR and felt improvement in energy level and dyspnea. Pacemaker function was normal.   She really notes significant improvement in her energy, breathing since pacemaker implanted. Wish she had it earlier. Notes when she sits down at night she has pins and needles sensation in legs below knees.    Past Medical History:  Diagnosis Date  . A-fib ([place])   . Acid reflux   . Anxiety   .  Arthritis   . Cataract    surgery,bilateral  . Constipation   . Depression   . Diabetes mellitus without complication (HCC)    Pre diabetes  . Dyspnea on exertion    a. 07/2015 Echo: EF 55-60%, no rwma, mild MR, mod dil LA/RA/RV, mod TR, triv PR, PASP 73mmHg.  . Falls   . Family history of adverse reaction to anesthesia    mother always had n/v  .  Hx: UTI (urinary tract infection)   . Hypercholesterolemia   . Hypertensive heart disease   . Insomnia   . Multifactorial gait disorder   . Non-obstructive CAD    a. 07/2015 Cath: LM 30-40d, LAD min irregs, LCX min irregs, RCA 30p, RA 17, RV 33/7, PA 42/19, PCWP 17.  . On home oxygen therapy    "2L w/CPAP at night" (02/24/2017)  . Osteoarthritis    a. 06/2015 s/p R total hip arthroplasty.  [name] PAF (paroxysmal atrial fibrillation) ([place])    a. Dx 07/2015, CHA2DS2VASc = 6-->Eliquis.  . Premature atrial contractions   . Pulmonary hypertension ([place])    a. 07/2015 Echo: PASP 37mmHg.  [name] Pyloric antral stenosis    in childhood,infancy  . Sleep apnea with use of continuous positive airway pressure (CPAP) 10/19/2012   a. compliant w/ CPAP.    Past Surgical History:  Procedure Laterality Date  . ABDOMINAL HYSTERECTOMY  1970  . ABDOMINAL SURGERY     as a 55 day old baby  . BREAST BIOPSY Left 2017  . CARDIAC CATHETERIZATION N/A 07/14/2015   Procedure: Right/Left Heart Cath and Coronary Angiography;  Surgeon: Sanda Klein, MD;  Location: [place] [facility];  Service: Cardiovascular;  Laterality: N/A;  . CARDIOVERSION N/A 11/04/2015   Procedure: CARDIOVERSION;  Surgeon: Lelon Perla, MD;  Location: [facility] ENDOSCOPY;  Service: Cardiovascular;  Laterality: N/A;  . CARDIOVERSION N/A 02/25/2016   Procedure: CARDIOVERSION;  Surgeon: Pixie Casino, MD;  Location: [facility] ENDOSCOPY;  Service: Cardiovascular;  Laterality: N/A;  . CARDIOVERSION N/A 12/13/2016   Procedure: CARDIOVERSION;  Surgeon: Fay Records, MD;  Location: [facility]COPY;  Service: Cardiovascular;  Laterality: N/A;  . CARDIOVERSION N/A 03/23/2017   Procedure: CARDIOVERSION;  Surgeon: Sueanne Margarita, MD;  Location: [facility] ENDOSCOPY;  Service: Cardiovascular;  Laterality: N/A;  . COLONOSCOPY    . EYE SURGERY Bilateral    cataract surgery with lens implant  . INNER EAR SURGERY Left    x 3  . JOINT REPLACEMENT Right   . LAPAROSCOPIC SALPINGO  OOPHERECTOMY    . NM MYOVIEW LTD  11/30/2007   normal stress nuclear study/EF-83%  . PACEMAKER IMPLANT N/A 08/04/2017   Procedure: PACEMAKER IMPLANT;  Surgeon: Evans Lance, MD;  Location: [place] [facility];  Service: Cardiovascular;  Laterality: N/A;  . TONSILLECTOMY    . TOTAL HIP ARTHROPLASTY Right 06/26/2015   Procedure: RIGHT TOTAL HIP ARTHROPLASTY ANTERIOR APPROACH;  Surgeon: Leandrew Koyanagi, MD;  Location: [place];  Service: Orthopedics;  Laterality: Right;  . TRANSTHORACIC ECHOCARDIOGRAM  07/23/2009   mild concentric LVH/mild mitral insufficiency/ moderate tricuspid innsufficiency/mild pulmonary HTN/EF- 55-60%  . VAGINAL DELIVERY      Current Medications: Outpatient Medications Prior to Visit  Medication Sig Dispense Refill  . acetaminophen (TYLENOL) 650 MG CR tablet Take 650 mg by mouth daily as needed (arthritis pain).     [name] amiodarone (PACERONE) 200 MG tablet Take 1 tablet (200 mg total) by mouth daily. 180 tablet 3  . amLODipine (NORVASC) 5 MG tablet  TAKE 1 TABLET BY MOUTH EVERY DAY 30 tablet 9  . amoxicillin (AMOXIL) 500 MG capsule Take 2,000 mg by mouth See admin instructions. Take 4 capsules (2000 mg) by mouth one hour prior to dental appointments    . apixaban (ELIQUIS) 5 MG TABS tablet Take 1 tablet (5 mg total) by mouth 2 (two) times daily. 60 tablet 0  . atorvastatin (LIPITOR) 40 MG tablet Take 40 mg by mouth daily.     . Calcium Carbonate-Vitamin D3 (CALCIUM 600+D3) 600-400 MG-UNIT TABS Take 1 tablet by mouth daily.    . cholecalciferol (VITAMIN D) 1000 UNITS tablet Take 1,000 Units by mouth daily.     [name] desipramine (NORPRAMIN) 25 MG tablet Take 25 mg by mouth daily.    . famotidine (PEPCID) 20 MG tablet Take 20 mg by mouth daily.    [name] glucosamine-chondroitin 500-400 MG tablet Take 1 tablet by mouth every other day.     . linaclotide (LINZESS) 145 MCG CAPS capsule Take 145 mcg by mouth daily before breakfast.     . LORazepam (ATIVAN) 0.5 MG tablet Take 1 mg by mouth at  bedtime.  5  . metoprolol succinate (TOPROL XL) 50 MG 24 hr tablet Take 1 tablet (50 mg total) by mouth daily. Take with or immediately following a meal. (Patient taking differently: Take 50 mg by mouth daily with lunch. Take with or immediately following a meal.) 30 tablet 11  . Multiple Vitamins-Minerals (OCUVITE PRESERVISION PO) Take 1 tablet by mouth daily.    [name] Glycol-Propyl Glycol (SYSTANE) 0.4-0.3 % GEL ophthalmic gel Place 1 application into both eyes at bedtime.     . polyethylene glycol (MIRALAX / GLYCOLAX) packet Take 17 g by mouth at bedtime. Mix in 8 oz liquid and drink    . potassium chloride (K-DUR) 10 MEQ tablet TAKE 2 TABLETS BY MOUTH TWICE A DAY . (Patient taking differently: TAKE 2 TABLETS BY MOUTH DAILY) 120 tablet 3  . PRESCRIPTION MEDICATION CPAP with oxygen at bedtime    . torsemide (DEMADEX) 10 MG tablet Take 10 mg by mouth daily.    . vitamin B-12 (CYANOCOBALAMIN) 1000 MCG tablet Take 1,000 mcg by mouth daily.     No facility-administered medications prior to visit.      Allergies:   Lasix [furosemide]; Phenobarbital; Adhesive [tape]; and Amitiza [lubiprostone]   Social History   Socioeconomic History  . Marital status: Widowed    Spouse name: Not on file  . Number of children: 1  . Years of education: 65  . Highest education level: Not on file  Occupational History  . Occupation: Retired  Scientific laboratory technician  . Financial resource strain: Not on file  . Food insecurity:    Worry: Not on file    Inability: Not on file  . Transportation needs:    Medical: Not on file    Non-medical: Not on file  Tobacco Use  . Smoking status: Never Smoker  . Smokeless tobacco: Never Used  Substance and Sexual Activity  . Alcohol use: No    Alcohol/week: 0.0 standard drinks    Comment: rare  . Drug use: No  . Sexual activity: Not on file  Lifestyle  . Physical activity:    Days per week: Not on file    Minutes per session: Not on file  . Stress: Not on file    Relationships  . Social connections:    Talks on phone: Not on file    Gets together: Not on file  Attends religious service: Not on file    Active member of club or organization: Not on file    Attends meetings of clubs or organizations: Not on file    Relationship status: Not on file  Other Topics Concern  . Not on file  Social History Narrative   Patient lives alone.    Patient is widowed.    Patient retired.    Patient some college.    Patient has one child.    Caffeine 1-2 cups daily avg.     Family History:  The patient's family history includes Cancer in her mother; Diabetes in her sister; Heart attack in her father; Heart failure in her mother and sister; Hypertension in her sister.   ROS:   Please see the history of present illness.    ROS All other systems reviewed and are negative.   PHYSICAL EXAM:   VS:  BP (!) 113/55   Pulse 73   Ht 5' (1.524 m)   Wt 167 lb 12.8 oz (76.1 kg)   BMI 32.77 kg/m    GENERAL:  Well appearing WF in NAD HEENT:  PERRL, EOMI, sclera are clear. Oropharynx is clear. NECK:  No jugular venous distention, carotid upstroke brisk and symmetric, no bruits, no thyromegaly or adenopathy LUNGS:  Clear to auscultation bilaterally CHEST:  Unremarkable HEART:  RRR,  PMI not displaced or sustained,S1 and S2 within normal limits, no S3, no S4: no clicks, no rubs, no murmurs ABD:  Soft, nontender. BS +, no masses or bruits. No hepatomegaly, no splenomegaly EXT:  2 + pulses throughout, no edema, no cyanosis no clubbing SKIN:  Warm and dry.  No rashes NEURO:  Alert and oriented x 3. Cranial nerves II through XII intact. PSYCH:  Cognitively intact    Wt Readings from Last 3 Encounters:  11/14/17 167 lb 12.8 oz (76.1 kg)  11/09/17 166 lb 3.2 oz (75.4 kg)  08/05/17 162 lb 6.4 oz (73.7 kg)      Studies/Labs Reviewed:   EKG:  EKG is not ordered today.    Recent Labs: 11/30/2016: Magnesium 2.1 03/23/2017: ALT 19; TSH 3.334 08/25/2017: BUN 21;  Creatinine, Ser 1.04; Hemoglobin 12.3; Platelets 171; Potassium 3.7; Sodium 142   Lipid Panel    Component Value Date/Time   CHOL 153 02/23/2017 0807   TRIG 137 02/23/2017 0807   HDL 62 02/23/2017 0807   CHOLHDL 2.5 02/23/2017 0807   VLDL 27 02/23/2017 0807   LDLCALC 64 02/23/2017 0807   Dated 05/25/17: cholesterol 107, triglycerides 76, HDL 45, LDL 47. A1c 6.3%. Hgb 12.4. LFTs and TSH normal   Additional studies/ records that were reviewed today include:   Echo 02/24/2017 LV EF: 45% -   50%  Study Conclusions  - Left ventricle: The cavity size was normal. Systolic function was   mildly reduced. The estimated ejection fraction was in the range   of 45% to 50%. Diffuse hypokinesis worse in the septal   myocardium. - Aortic valve: There was no regurgitation. - Mitral valve: Mildly calcified annulus. There was mild   regurgitation. - Left atrium: The atrium was moderately dilated. - Right ventricle: The cavity size was normal. Wall thickness was   normal. Systolic function was normal. - Right atrium: The atrium was severely dilated. - Atrial septum: No defect or patent foramen ovale was identified. - Tricuspid valve: There was mild regurgitation. - Pulmonary arteries: Systolic pressure was within the normal   range. PA peak pressure: 32 mm Hg (S). -  Pericardium, extracardiac: A small pericardial effusion was   identified. There was evidence for increased RV-LV interaction   demonstrated by respirophasic changes in transmitral velocities   and tricuspid velocities.  Impressions: - Variation was noted in the mitral and tricuspid valve inflow.   This is likely due to atrial fibrillation rather than tamponade.   IVC collapses normally.    ASSESSMENT:    1. Sinus node dysfunction (HCC)   2. Paroxysmal atrial fibrillation ([place])   3. Chronic diastolic heart failure ([place])   4. Essential hypertension      PLAN:  In order of problems listed above:  1. Chronic  diastolic heart failure: clinically volume status looks good.   2. PAF/flutter with tachybrady syndrome and symptomatic bradycardia. S/p DDD pacemaker implant. History of recurrent atrial fibrillation and atypical atrial flutter.  Not sure there is any option but amiodarone. Will continue. Continue Eliquis.  3. Hypertension: Blood pressure is well controlled.  4. Hyperlipidemia: On Lipitor 40 mg daily.  Last lipid panel obtained in March 2019 showed well-controlled total cholesterol, triglyceride, HDL and LDL.  5. DM 2: borderline. A1c 6.3%. Managed with diet.   6. Obstructive sleep apnea on CPAP: Compliant with CPAP therapy  7. Pulmonary hypertension: Mild by cardiac cath.    Medication Adjustments/Labs and Tests Ordered: Current medicines are reviewed at length with the patient today.  Concerns regarding medicines are outlined above.  Medication changes, Labs and Tests ordered today are listed in the Patient Instructions below. Patient Instructions  Continue your current therapy  I will see you in 6 months      Signed, Yevonne Yokum Martinique, MD  11/14/2017 2:34 PM    [facility], [place], [place]  [address] Phone: [phone]; Fax: [phone]

## 2017-11-14 ENCOUNTER — Ambulatory Visit: Payer: Medicare HMO | Admitting: Cardiology

## 2017-11-14 ENCOUNTER — Encounter: Payer: Self-pay | Admitting: Cardiology

## 2017-11-14 VITALS — BP 113/55 | HR 73 | Ht 60.0 in | Wt 167.8 lb

## 2017-11-14 DIAGNOSIS — I5032 Chronic diastolic (congestive) heart failure: Secondary | ICD-10-CM

## 2017-11-14 DIAGNOSIS — I495 Sick sinus syndrome: Secondary | ICD-10-CM | POA: Diagnosis not present

## 2017-11-14 DIAGNOSIS — I10 Essential (primary) hypertension: Secondary | ICD-10-CM

## 2017-11-14 DIAGNOSIS — I480 Paroxysmal atrial fibrillation: Secondary | ICD-10-CM | POA: Diagnosis not present

## 2017-11-14 NOTE — Patient Instructions (Signed)
Continue your current therapy  I will see you in 6 months.   

## 2017-11-23 DIAGNOSIS — H353211 Exudative age-related macular degeneration, right eye, with active choroidal neovascularization: Secondary | ICD-10-CM | POA: Diagnosis not present

## 2017-11-28 DIAGNOSIS — I10 Essential (primary) hypertension: Secondary | ICD-10-CM | POA: Diagnosis not present

## 2017-11-28 DIAGNOSIS — I480 Paroxysmal atrial fibrillation: Secondary | ICD-10-CM | POA: Diagnosis not present

## 2017-11-28 DIAGNOSIS — Z950 Presence of cardiac pacemaker: Secondary | ICD-10-CM | POA: Diagnosis not present

## 2017-11-28 DIAGNOSIS — I2510 Atherosclerotic heart disease of native coronary artery without angina pectoris: Secondary | ICD-10-CM | POA: Diagnosis not present

## 2017-11-28 DIAGNOSIS — Z6833 Body mass index (BMI) 33.0-33.9, adult: Secondary | ICD-10-CM | POA: Diagnosis not present

## 2017-11-28 DIAGNOSIS — M859 Disorder of bone density and structure, unspecified: Secondary | ICD-10-CM | POA: Diagnosis not present

## 2017-11-28 DIAGNOSIS — Z23 Encounter for immunization: Secondary | ICD-10-CM | POA: Diagnosis not present

## 2017-11-28 DIAGNOSIS — R69 Illness, unspecified: Secondary | ICD-10-CM | POA: Diagnosis not present

## 2017-11-28 DIAGNOSIS — E1169 Type 2 diabetes mellitus with other specified complication: Secondary | ICD-10-CM | POA: Diagnosis not present

## 2017-11-29 DIAGNOSIS — D7589 Other specified diseases of blood and blood-forming organs: Secondary | ICD-10-CM | POA: Diagnosis not present

## 2017-11-29 DIAGNOSIS — Z79899 Other long term (current) drug therapy: Secondary | ICD-10-CM | POA: Diagnosis not present

## 2017-11-30 ENCOUNTER — Ambulatory Visit (INDEPENDENT_AMBULATORY_CARE_PROVIDER_SITE_OTHER): Payer: Medicare HMO | Admitting: Orthopaedic Surgery

## 2017-11-30 ENCOUNTER — Ambulatory Visit (INDEPENDENT_AMBULATORY_CARE_PROVIDER_SITE_OTHER): Payer: Medicare HMO

## 2017-11-30 ENCOUNTER — Encounter (INDEPENDENT_AMBULATORY_CARE_PROVIDER_SITE_OTHER): Payer: Self-pay | Admitting: Orthopaedic Surgery

## 2017-11-30 DIAGNOSIS — M25562 Pain in left knee: Secondary | ICD-10-CM | POA: Diagnosis not present

## 2017-11-30 DIAGNOSIS — M545 Low back pain: Secondary | ICD-10-CM

## 2017-11-30 DIAGNOSIS — G8929 Other chronic pain: Secondary | ICD-10-CM

## 2017-11-30 MED ORDER — DICLOFENAC SODIUM 1 % TD GEL: 2.0000 g | Freq: Four times a day (QID) | TRANSDERMAL | 5 refills | Status: AC

## 2017-11-30 MED ORDER — BUPIVACAINE HCL 0.5 % IJ SOLN
2.0000 mL | INTRAMUSCULAR | Status: AC | PRN
  Administered 2017-11-30: 2 mL via INTRA_ARTICULAR

## 2017-11-30 MED ORDER — LIDOCAINE HCL 1 % IJ SOLN
2.0000 mL | INTRAMUSCULAR | Status: AC | PRN
  Administered 2017-11-30: 2 mL

## 2017-11-30 MED ORDER — METHYLPREDNISOLONE ACETATE 40 MG/ML IJ SUSP
40.0000 mg | INTRAMUSCULAR | Status: AC | PRN
  Administered 2017-11-30: 40 mg via INTRA_ARTICULAR

## 2017-11-30 NOTE — Progress Notes (Signed)
Office Visit Note   Patient: Melinda [name]           Date of Birth: 1931/06/16           MRN: [id number] Visit Date: 11/30/2017              Requested by: Crist Infante, MD [address] [place], [place] [address] PCP: Crist Infante, MD   Assessment & Plan: Visit Diagnoses:  1. Chronic pain of left knee   2. Low back pain, unspecified back pain laterality, unspecified chronicity, with sciatica presence unspecified     Plan: Impression is left knee degenerative joint disease.  Her back x-rays certainly show spondylosis and degenerative disc disease but I think her pain is mainly coming from the knee.  We performed a cortisone injection today and gave her a prescription for Voltaren gel.  Hopefully this will give her some good relief.  If not she has been instructed to follow-up in a couple weeks.  Follow-Up Instructions: Return if symptoms worsen or fail to improve.   Orders:  Orders Placed This Encounter  Procedures  . XR KNEE 3 VIEW LEFT  . XR Lumbar Spine 2-3 Views   Meds ordered this encounter  Medications  . diclofenac sodium (VOLTAREN) 1 % GEL    Sig: Apply 2 g topically 4 (four) times daily.    Dispense:  1 Tube    Refill:  5      Procedures: Large Joint Inj: L knee on 11/30/2017 4:00 PM Details: 22 G needle Medications: 2 mL bupivacaine 0.5 %; 2 mL lidocaine 1 %; 40 mg methylPREDNISolone acetate 40 MG/ML Outcome: tolerated well, no immediate complications Patient was prepped and draped in the usual sterile fashion.       Clinical Data: No additional findings.   Subjective: Chief Complaint  Patient presents with  . Left Knee - Pain    Melinda [name] is coming in today for left knee pain.  I have been seeing her for a couple years now.  I replaced her right hip 2 years ago.  She states that her left knee has been hurting for months.  She feels like it wants to give out and this is causing her burning pain.  She complains of mainly knee pain that radiates up  into the thigh.   Review of Systems  Constitutional: Negative.   HENT: Negative.   Eyes: Negative.   Respiratory: Negative.   Cardiovascular: Negative.   Endocrine: Negative.   Musculoskeletal: Negative.   Neurological: Negative.   Hematological: Negative.   Psychiatric/Behavioral: Negative.   All other systems reviewed and are negative.    Objective: Vital Signs: There were no vitals taken for this visit.  Physical Exam  Constitutional: She is oriented to person, place, and time. She appears well-developed and well-nourished.  Pulmonary/Chest: Effort normal.  Neurological: She is alert and oriented to person, place, and time.  Skin: Skin is warm. Capillary refill takes less than 2 seconds.  Psychiatric: She has a normal mood and affect. Her behavior is normal. Judgment and thought content normal.  Nursing note and vitals reviewed.   Ortho Exam Left knee exam joint effusion.  Collaterals and cruciates are stable.  Normal range of motion. Specialty Comments:  No specialty comments available.  Imaging: Xr Knee 3 View Left  Result Date: 11/30/2017 Advanced degenerative joint disease worst in the medial compartment  Xr Lumbar Spine 2-3 Views  Result Date: 11/30/2017 Lumbar spondylosis and multilevel degenerative disc disease.  PMFS History: Patient Active Problem List   Diagnosis Date Noted  . Sinus node dysfunction ([place]) 08/04/2017  . Chronic combined systolic and diastolic congestive heart failure ([place]) 07/21/2017  . Complex sleep apnea syndrome 07/21/2017  . Delirium due to another medical condition 07/21/2017  . Atrial flutter, paroxysmal ([place]) 07/21/2017  . Shock ([place]) 03/23/2017  . Atrial flutter ([place]) 03/22/2017  . Uncontrolled atrial fibrillation ([place]) 02/24/2017  . GERD (gastroesophageal reflux disease) 02/23/2017  . Diabetes ([place]) 02/23/2017  . Atypical atrial flutter ([place])   . Prolonged Q-T interval on ECG   . Pain in right hip 06/22/2016  .  Encounter for monitoring sotalol therapy 05/31/2016  . Chest pain   . CHF exacerbation ([place]) 02/29/2016  . Acute respiratory failure with hypoxia ([place]) 02/29/2016  . Persistent atrial fibrillation ([place])   . Hypertensive heart disease   . Hypercholesterolemia   . Pulmonary hypertension ([place])   . Dyspnea on exertion   . Non-obstructive CAD   . Chest pain, moderate coronary artery risk 07/14/2015  . Pain in the chest   . PAF (paroxysmal atrial fibrillation) (HCC)   . Pulmonary arterial hypertension ([place])   . Chest pain with moderate risk for cardiac etiology 07/08/2015  . Dyspnea 07/08/2015  . Essential hypertension 07/08/2015  . Hyperlipidemia 07/08/2015  . Osteoarthritis of right hip 06/26/2015  . Hip joint replacement status 06/26/2015  . OSA on CPAP 03/27/2015  . Ataxia 03/27/2015  . Falls 03/27/2015  . Insomnia, controlled 03/27/2015  . Insomnia with sleep apnea 03/22/2014  . Adjustment disorder with mixed anxiety and depressed mood 03/22/2014  . Sleep apnea with use of continuous positive airway pressure (CPAP) 10/19/2012  . Multifactorial gait disorder    Past Medical History:  Diagnosis Date  . A-fib ([place])   . Acid reflux   . Anxiety   . Arthritis   . Cataract    surgery,bilateral  . Constipation   . Depression   . Diabetes mellitus without complication (HCC)    Pre diabetes  . Dyspnea on exertion    a. 07/2015 Echo: EF 55-60%, no rwma, mild MR, mod dil LA/RA/RV, mod TR, triv PR, PASP 15mmHg.  . Falls   . Family history of adverse reaction to anesthesia    mother always had n/v  . Hx: UTI (urinary tract infection)   . Hypercholesterolemia   . Hypertensive heart disease   . Insomnia   . Multifactorial gait disorder   . Non-obstructive CAD    a. 07/2015 Cath: LM 30-40d, LAD min irregs, LCX min irregs, RCA 30p, RA 17, RV 33/7, PA 42/19, PCWP 17.  . On home oxygen therapy    "2L w/CPAP at night" (02/24/2017)  . Osteoarthritis    a. 06/2015 s/p R total hip  arthroplasty.  [name] PAF (paroxysmal atrial fibrillation) ([place])    a. Dx 07/2015, CHA2DS2VASc = 6-->Eliquis.  . Premature atrial contractions   . Pulmonary hypertension ([place])    a. 07/2015 Echo: PASP 39mmHg.  [name] Pyloric antral stenosis    in childhood,infancy  . Sleep apnea with use of continuous positive airway pressure (CPAP) 10/19/2012   a. compliant w/ CPAP.    Family History  Problem Relation Age of Onset  . Heart attack Father   . Heart failure Mother   . Cancer Mother        colon,kidney  . Diabetes Sister   . Hypertension Sister   . Heart failure Sister     Past Surgical History:  Procedure Laterality  Date  . ABDOMINAL HYSTERECTOMY  1970  . ABDOMINAL SURGERY     as a 52 day old baby  . BREAST BIOPSY Left 2017  . CARDIAC CATHETERIZATION N/A 07/14/2015   Procedure: Right/Left Heart Cath and Coronary Angiography;  Surgeon: Sanda Klein, MD;  Location: [place] [facility];  Service: Cardiovascular;  Laterality: N/A;  . CARDIOVERSION N/A 11/04/2015   Procedure: CARDIOVERSION;  Surgeon: Lelon Perla, MD;  Location: [facility] ENDOSCOPY;  Service: Cardiovascular;  Laterality: N/A;  . CARDIOVERSION N/A 02/25/2016   Procedure: CARDIOVERSION;  Surgeon: Pixie Casino, MD;  Location: [facility] ENDOSCOPY;  Service: Cardiovascular;  Laterality: N/A;  . CARDIOVERSION N/A 12/13/2016   Procedure: CARDIOVERSION;  Surgeon: Fay Records, MD;  Location: [facility] ENDOSCOPY;  Service: Cardiovascular;  Laterality: N/A;  . CARDIOVERSION N/A 03/23/2017   Procedure: CARDIOVERSION;  Surgeon: Sueanne Margarita, MD;  Location: [facility] ENDOSCOPY;  Service: Cardiovascular;  Laterality: N/A;  . COLONOSCOPY    . EYE SURGERY Bilateral    cataract surgery with lens implant  . INNER EAR SURGERY Left    x 3  . JOINT REPLACEMENT Right   . LAPAROSCOPIC SALPINGO OOPHERECTOMY    . NM MYOVIEW LTD  11/30/2007   normal stress nuclear study/EF-83%  . PACEMAKER IMPLANT N/A 08/04/2017   Procedure: PACEMAKER IMPLANT;  Surgeon: Evans Lance,  MD;  Location: [place] [facility];  Service: Cardiovascular;  Laterality: N/A;  . TONSILLECTOMY    . TOTAL HIP ARTHROPLASTY Right 06/26/2015   Procedure: RIGHT TOTAL HIP ARTHROPLASTY ANTERIOR APPROACH;  Surgeon: Leandrew Koyanagi, MD;  Location: [place];  Service: Orthopedics;  Laterality: Right;  . TRANSTHORACIC ECHOCARDIOGRAM  07/23/2009   mild concentric LVH/mild mitral insufficiency/ moderate tricuspid innsufficiency/mild pulmonary HTN/EF- 55-60%  . VAGINAL DELIVERY     Social History   Occupational History  . Occupation: Retired  Tobacco Use  . Smoking status: Never Smoker  . Smokeless tobacco: Never Used  Substance and Sexual Activity  . Alcohol use: No    Alcohol/week: 0.0 standard drinks    Comment: rare  . Drug use: No  . Sexual activity: Not on file

## 2017-12-02 DIAGNOSIS — R0902 Hypoxemia: Secondary | ICD-10-CM | POA: Diagnosis not present

## 2017-12-02 DIAGNOSIS — I5043 Acute on chronic combined systolic (congestive) and diastolic (congestive) heart failure: Secondary | ICD-10-CM | POA: Diagnosis not present

## 2017-12-02 DIAGNOSIS — G4733 Obstructive sleep apnea (adult) (pediatric): Secondary | ICD-10-CM | POA: Diagnosis not present

## 2017-12-06 ENCOUNTER — Emergency Department (INDEPENDENT_AMBULATORY_CARE_PROVIDER_SITE_OTHER)
Admission: EM | Admit: 2017-12-06 | Discharge: 2017-12-06 | Disposition: A | Discharge status: Home / Self Care | Payer: Medicare HMO | Source: Home / Self Care | Attending: Family Medicine | Admitting: Family Medicine

## 2017-12-06 ENCOUNTER — Other Ambulatory Visit: Payer: Self-pay

## 2017-12-06 DIAGNOSIS — T07XXXA Unspecified multiple injuries, initial encounter: Secondary | ICD-10-CM | POA: Diagnosis not present

## 2017-12-06 DIAGNOSIS — S51811A Laceration without foreign body of right forearm, initial encounter: Secondary | ICD-10-CM

## 2017-12-06 NOTE — Discharge Instructions (Signed)
°  Keep wounds clean with warm water and mild soap.  Please follow up with family medicine for a wound recheck if not improving in 1 week or if concerned for skin infection.

## 2017-12-06 NOTE — ED Provider Notes (Signed)
Melinda [name] CARE    CSN: [id number] Arrival date & time: 12/06/17  0954     History   Chief Complaint Chief Complaint  Patient presents with  . Arm Injury    HPI Melinda [name] is a 84 y.o. female.   HPI  Melinda [name] is a 84 y.o. female presenting to UC with c/o multiple abrasions to Right forearm that occurred about 2 days ago.  Pt uses a walker due to an unsteady gate, while heading back into her house to retrieve her cane, she stumbled and hit her arm against a brick wall. Denies hitting her head or LOC.   Bleeding controlled PTA but pt states she cannot change the bandages by herself.  Pain is minimal.    Past Medical History:  Diagnosis Date  . A-fib ([place])   . Acid reflux   . Anxiety   . Arthritis   . Cataract    surgery,bilateral  . Constipation   . Depression   . Diabetes mellitus without complication (HCC)    Pre diabetes  . Dyspnea on exertion    a. 07/2015 Echo: EF 55-60%, no rwma, mild MR, mod dil LA/RA/RV, mod TR, triv PR, PASP 65mmHg.  . Falls   . Family history of adverse reaction to anesthesia    mother always had n/v  . Hx: UTI (urinary tract infection)   . Hypercholesterolemia   . Hypertensive heart disease   . Insomnia   . Multifactorial gait disorder   . Non-obstructive CAD    a. 07/2015 Cath: LM 30-40d, LAD min irregs, LCX min irregs, RCA 30p, RA 17, RV 33/7, PA 42/19, PCWP 17.  . On home oxygen therapy    "2L w/CPAP at night" (02/24/2017)  . Osteoarthritis    a. 06/2015 s/p R total hip arthroplasty.  [name] PAF (paroxysmal atrial fibrillation) ([place])    a. Dx 07/2015, CHA2DS2VASc = 6-->Eliquis.  . Premature atrial contractions   . Pulmonary hypertension ([place])    a. 07/2015 Echo: PASP 92mmHg.  [name] Pyloric antral stenosis    in childhood,infancy  . Sleep apnea with use of continuous positive airway pressure (CPAP) 10/19/2012   a. compliant w/ CPAP.    Patient Active Problem List   Diagnosis Date Noted  . Sinus node dysfunction  ([place]) 08/04/2017  . Chronic combined systolic and diastolic congestive heart failure ([place]) 07/21/2017  . Complex sleep apnea syndrome 07/21/2017  . Delirium due to another medical condition 07/21/2017  . Atrial flutter, paroxysmal ([place]) 07/21/2017  . Shock ([place]) 03/23/2017  . Atrial flutter ([place]) 03/22/2017  . Uncontrolled atrial fibrillation ([place]) 02/24/2017  . GERD (gastroesophageal reflux disease) 02/23/2017  . Diabetes ([place]) 02/23/2017  . Atypical atrial flutter ([place])   . Prolonged Q-T interval on ECG   . Pain in right hip 06/22/2016  . Encounter for monitoring sotalol therapy 05/31/2016  . Chest pain   . CHF exacerbation ([place]) 02/29/2016  . Acute respiratory failure with hypoxia ([place]) 02/29/2016  . Persistent atrial fibrillation   . Hypertensive heart disease   . Hypercholesterolemia   . Pulmonary hypertension ([place])   . Dyspnea on exertion   . Non-obstructive CAD   . Chest pain, moderate coronary artery risk 07/14/2015  . Pain in the chest   . PAF (paroxysmal atrial fibrillation) (HCC)   . Pulmonary arterial hypertension ([place])   . Chest pain with moderate risk for cardiac etiology 07/08/2015  . Dyspnea 07/08/2015  . Essential hypertension 07/08/2015  . Hyperlipidemia 07/08/2015  .  Osteoarthritis of right hip 06/26/2015  . Hip joint replacement status 06/26/2015  . OSA on CPAP 03/27/2015  . Ataxia 03/27/2015  . Falls 03/27/2015  . Insomnia, controlled 03/27/2015  . Insomnia with sleep apnea 03/22/2014  . Adjustment disorder with mixed anxiety and depressed mood 03/22/2014  . Sleep apnea with use of continuous positive airway pressure (CPAP) 10/19/2012  . Multifactorial gait disorder     Past Surgical History:  Procedure Laterality Date  . ABDOMINAL HYSTERECTOMY  1970  . ABDOMINAL SURGERY     as a 47 day old baby  . BREAST BIOPSY Left 2017  . CARDIAC CATHETERIZATION N/A 07/14/2015   Procedure: Right/Left Heart Cath and Coronary Angiography;  Surgeon: Sanda Klein,  MD;  Location: [place] [facility];  Service: Cardiovascular;  Laterality: N/A;  . CARDIOVERSION N/A 11/04/2015   Procedure: CARDIOVERSION;  Surgeon: Lelon Perla, MD;  Location: [facility] ENDOSCOPY;  Service: Cardiovascular;  Laterality: N/A;  . CARDIOVERSION N/A 02/25/2016   Procedure: CARDIOVERSION;  Surgeon: Pixie Casino, MD;  Location: [facility] ENDOSCOPY;  Service: Cardiovascular;  Laterality: N/A;  . CARDIOVERSION N/A 12/13/2016   Procedure: CARDIOVERSION;  Surgeon: Fay Records, MD;  Location: [facility] ENDOSCOPY;  Service: Cardiovascular;  Laterality: N/A;  . CARDIOVERSION N/A 03/23/2017   Procedure: CARDIOVERSION;  Surgeon: Sueanne Margarita, MD;  Location: [facility] ENDOSCOPY;  Service: Cardiovascular;  Laterality: N/A;  . COLONOSCOPY    . EYE SURGERY Bilateral    cataract surgery with lens implant  . INNER EAR SURGERY Left    x 3  . JOINT REPLACEMENT Right   . LAPAROSCOPIC SALPINGO OOPHERECTOMY    . NM MYOVIEW LTD  11/30/2007   normal stress nuclear study/EF-83%  . PACEMAKER IMPLANT N/A 08/04/2017   Procedure: PACEMAKER IMPLANT;  Surgeon: Evans Lance, MD;  Location: [place] [facility];  Service: Cardiovascular;  Laterality: N/A;  . TONSILLECTOMY    . TOTAL HIP ARTHROPLASTY Right 06/26/2015   Procedure: RIGHT TOTAL HIP ARTHROPLASTY ANTERIOR APPROACH;  Surgeon: Leandrew Koyanagi, MD;  Location: [place];  Service: Orthopedics;  Laterality: Right;  . TRANSTHORACIC ECHOCARDIOGRAM  07/23/2009   mild concentric LVH/mild mitral insufficiency/ moderate tricuspid innsufficiency/mild pulmonary HTN/EF- 55-60%  . VAGINAL DELIVERY      OB History   None      Home Medications    Prior to Admission medications   Medication Sig Start Date End Date Taking? Authorizing Provider  acetaminophen (TYLENOL) 650 MG CR tablet Take 650 mg by mouth daily as needed (arthritis pain).     [provider]  amiodarone (PACERONE) 200 MG tablet Take 1 tablet (200 mg total) by mouth daily. 05/31/17 05/31/18  Almyra Deforest, PA    amLODipine (NORVASC) 5 MG tablet TAKE 1 TABLET BY MOUTH EVERY DAY 08/22/17   [place]ique, Peter M, MD  amoxicillin (AMOXIL) 500 MG capsule Take 2,000 mg by mouth See admin instructions. Take 4 capsules (2000 mg) by mouth one hour prior to dental appointments    [provider]  apixaban (ELIQUIS) 5 MG TABS tablet Take 1 tablet (5 mg total) by mouth 2 (two) times daily. 07/15/15   Robbie Lis, MD  atorvastatin (LIPITOR) 40 MG tablet Take 40 mg by mouth daily.     [provider]  Calcium Carbonate-Vitamin D3 (CALCIUM 600+D3) 600-400 MG-UNIT TABS Take 1 tablet by mouth daily.    [provider]  cholecalciferol (VITAMIN D) 1000 UNITS tablet Take 1,000 Units by mouth daily.     [provider]  desipramine (NORPRAMIN) 25 MG tablet Take 25 mg by mouth daily.    [provider]  diclofenac sodium (VOLTAREN) 1 % GEL Apply 2 g topically 4 (four) times daily. 11/30/17   Leandrew Koyanagi, MD  famotidine (PEPCID) 20 MG tablet Take 20 mg by mouth daily.    [provider]  glucosamine-chondroitin 500-400 MG tablet Take 1 tablet by mouth every other day.     [provider]  linaclotide (LINZESS) 145 MCG CAPS capsule Take 145 mcg by mouth daily before breakfast.     [provider]  LORazepam (ATIVAN) 0.5 MG tablet Take 1 mg by mouth at bedtime. 08/14/17   [provider]  metoprolol succinate (TOPROL XL) 50 MG 24 hr tablet Take 1 tablet (50 mg total) by mouth daily. Take with or immediately following a meal. Patient taking differently: Take 50 mg by mouth daily with lunch. Take with or immediately following a meal. 08/05/17 08/05/18  Baldwin Jamaica, PA-C  Multiple Vitamins-Minerals (OCUVITE PRESERVISION PO) Take 1 tablet by mouth daily.    [provider]  Polyethyl Glycol-Propyl Glycol (SYSTANE) 0.4-0.3 % GEL ophthalmic gel Place 1 application into both eyes at bedtime.     [provider]  polyethylene glycol (MIRALAX /  GLYCOLAX) packet Take 17 g by mouth at bedtime. Mix in 8 oz liquid and drink    [provider]  potassium chloride (K-DUR) 10 MEQ tablet TAKE 2 TABLETS BY MOUTH TWICE A DAY . Patient taking differently: TAKE 2 TABLETS BY MOUTH DAILY 08/22/17   [place], Peter M, MD  PRESCRIPTION MEDICATION CPAP with oxygen at bedtime    [provider]  torsemide (DEMADEX) 10 MG tablet Take 10 mg by mouth daily.    [provider]  vitamin B-12 (CYANOCOBALAMIN) 1000 MCG tablet Take 1,000 mcg by mouth daily.    [provider]    Family History Family History  Problem Relation Age of Onset  . Heart attack Father   . Heart failure Mother   . Cancer Mother        colon,kidney  . Diabetes Sister   . Hypertension Sister   . Heart failure Sister     Social History Social History   Tobacco Use  . Smoking status: Never Smoker  . Smokeless tobacco: Never Used  Substance Use Topics  . Alcohol use: No    Alcohol/week: 0.0 standard drinks    Comment: rare  . Drug use: No     Allergies   Lasix [furosemide]; Phenobarbital; Adhesive [tape]; and Amitiza [lubiprostone]   Review of Systems Review of Systems  Musculoskeletal: Negative for arthralgias, joint swelling and myalgias.  Skin: Positive for color change and wound.  Neurological: Negative for weakness and numbness.     Physical Exam Triage Vital Signs ED Triage Vitals [12/06/17 1013]  Enc Vitals Group     BP 112/70     Pulse Rate 64     Resp      Temp (!) 97.5 F (36.4 C)     Temp Source Oral     SpO2 94 %     Weight 164 lb (74.4 kg)     Height 4\' 11"  (1.499 m)     Head Circumference      Peak Flow      Pain Score 0     Pain Loc      Pain Edu?      Excl. in [place]?    No  data found.  Updated Vital Signs BP 112/70 (BP Location: Left Arm)   Pulse 64   Temp (!) 97.5 F (36.4 C) (Oral)   Ht 4\' 11"  (1.499 m)   Wt 164 lb (74.4 kg)   SpO2 94%   BMI 33.12 kg/m   Visual Acuity Right Eye  Distance:   Left Eye Distance:   Bilateral Distance:    Right Eye Near:   Left Eye Near:    Bilateral Near:     Physical Exam  Constitutional: She is oriented to person, place, and time. She appears well-developed and well-nourished.  HENT:  Head: Normocephalic and atraumatic.  Eyes: EOM are normal.  Neck: Normal range of motion.  Cardiovascular: Normal rate.  Pulmonary/Chest: Effort normal.  Musculoskeletal: Normal range of motion. She exhibits tenderness.  Right forearm: no edema. Full ROM elbow and wrist.  Mild tenderness to forearm.   Neurological: She is alert and oriented to person, place, and time.  Skin: Skin is warm and dry. Capillary refill takes less than 2 seconds.  Right forearm: multiple 0.5-1cm skin tears with dried dark blood. Diffuse ecchymosis. No active bleeding. No deep wounds.   Psychiatric: She has a normal mood and affect. Her behavior is normal.  Nursing note and vitals reviewed.    UC Treatments / Results  Labs (all labs ordered are listed, but only abnormal results are displayed) Labs Reviewed - No data to display  EKG None  Radiology No results found.  Procedures Procedures (including critical care time)  Medications Ordered in UC Medications - No data to display  Initial Impression / Assessment and Plan / UC Course  I have reviewed the triage vital signs and the nursing notes.  Pertinent labs & imaging results that were available during my care of the patient were reviewed by me and considered in my medical decision making (see chart for details).     Wound cleaned with warm water and hibiclens. Bacitracin and nonstick bandage applied No evidence of underlying infection Home care instructions provided.  Final Clinical Impressions(s) / UC Diagnoses   Final diagnoses:  Multiple abrasions  Skin tear of right forearm without complication, initial encounter     Discharge Instructions      Keep wounds clean with warm water and  mild soap.  Please follow up with family medicine for a wound recheck if not improving in 1 week or if concerned for skin infection.     ED Prescriptions    None     Controlled Substance Prescriptions [place] Controlled Substance Registry consulted? Not Applicable   Tyrell Antonio 12/06/17 1129

## 2017-12-06 NOTE — ED Triage Notes (Signed)
Pt was going out of her house Saturday night, and lost her balance, and fell against the bricks in the door jam.  Pt has multiple abrasions on the right forearm.

## 2017-12-13 DIAGNOSIS — Z6832 Body mass index (BMI) 32.0-32.9, adult: Secondary | ICD-10-CM | POA: Diagnosis not present

## 2017-12-13 DIAGNOSIS — R69 Illness, unspecified: Secondary | ICD-10-CM | POA: Diagnosis not present

## 2017-12-13 DIAGNOSIS — M5416 Radiculopathy, lumbar region: Secondary | ICD-10-CM | POA: Diagnosis not present

## 2017-12-13 DIAGNOSIS — I10 Essential (primary) hypertension: Secondary | ICD-10-CM | POA: Diagnosis not present

## 2017-12-29 DIAGNOSIS — R69 Illness, unspecified: Secondary | ICD-10-CM | POA: Diagnosis not present

## 2018-01-01 DIAGNOSIS — I5043 Acute on chronic combined systolic (congestive) and diastolic (congestive) heart failure: Secondary | ICD-10-CM | POA: Diagnosis not present

## 2018-01-01 DIAGNOSIS — R0902 Hypoxemia: Secondary | ICD-10-CM | POA: Diagnosis not present

## 2018-01-01 DIAGNOSIS — G4733 Obstructive sleep apnea (adult) (pediatric): Secondary | ICD-10-CM | POA: Diagnosis not present

## 2018-01-03 DIAGNOSIS — H353211 Exudative age-related macular degeneration, right eye, with active choroidal neovascularization: Secondary | ICD-10-CM | POA: Diagnosis not present

## 2018-01-03 DIAGNOSIS — M25562 Pain in left knee: Secondary | ICD-10-CM | POA: Diagnosis not present

## 2018-01-03 DIAGNOSIS — H43813 Vitreous degeneration, bilateral: Secondary | ICD-10-CM | POA: Diagnosis not present

## 2018-01-03 DIAGNOSIS — H353121 Nonexudative age-related macular degeneration, left eye, early dry stage: Secondary | ICD-10-CM | POA: Diagnosis not present

## 2018-01-03 DIAGNOSIS — Z6832 Body mass index (BMI) 32.0-32.9, adult: Secondary | ICD-10-CM | POA: Diagnosis not present

## 2018-01-03 DIAGNOSIS — I10 Essential (primary) hypertension: Secondary | ICD-10-CM | POA: Diagnosis not present

## 2018-01-03 DIAGNOSIS — R296 Repeated falls: Secondary | ICD-10-CM | POA: Diagnosis not present

## 2018-01-10 DIAGNOSIS — M9903 Segmental and somatic dysfunction of lumbar region: Secondary | ICD-10-CM | POA: Diagnosis not present

## 2018-01-10 DIAGNOSIS — M9906 Segmental and somatic dysfunction of lower extremity: Secondary | ICD-10-CM | POA: Diagnosis not present

## 2018-01-10 DIAGNOSIS — M5442 Lumbago with sciatica, left side: Secondary | ICD-10-CM | POA: Diagnosis not present

## 2018-01-10 DIAGNOSIS — M9904 Segmental and somatic dysfunction of sacral region: Secondary | ICD-10-CM | POA: Diagnosis not present

## 2018-01-11 DIAGNOSIS — M9903 Segmental and somatic dysfunction of lumbar region: Secondary | ICD-10-CM | POA: Diagnosis not present

## 2018-01-11 DIAGNOSIS — M5442 Lumbago with sciatica, left side: Secondary | ICD-10-CM | POA: Diagnosis not present

## 2018-01-11 DIAGNOSIS — M9904 Segmental and somatic dysfunction of sacral region: Secondary | ICD-10-CM | POA: Diagnosis not present

## 2018-01-11 DIAGNOSIS — M9906 Segmental and somatic dysfunction of lower extremity: Secondary | ICD-10-CM | POA: Diagnosis not present

## 2018-01-17 DIAGNOSIS — M9903 Segmental and somatic dysfunction of lumbar region: Secondary | ICD-10-CM | POA: Diagnosis not present

## 2018-01-17 DIAGNOSIS — M9904 Segmental and somatic dysfunction of sacral region: Secondary | ICD-10-CM | POA: Diagnosis not present

## 2018-01-17 DIAGNOSIS — M5442 Lumbago with sciatica, left side: Secondary | ICD-10-CM | POA: Diagnosis not present

## 2018-01-17 DIAGNOSIS — M9906 Segmental and somatic dysfunction of lower extremity: Secondary | ICD-10-CM | POA: Diagnosis not present

## 2018-01-18 DIAGNOSIS — M9906 Segmental and somatic dysfunction of lower extremity: Secondary | ICD-10-CM | POA: Diagnosis not present

## 2018-01-18 DIAGNOSIS — M5442 Lumbago with sciatica, left side: Secondary | ICD-10-CM | POA: Diagnosis not present

## 2018-01-18 DIAGNOSIS — M9903 Segmental and somatic dysfunction of lumbar region: Secondary | ICD-10-CM | POA: Diagnosis not present

## 2018-01-18 DIAGNOSIS — M9904 Segmental and somatic dysfunction of sacral region: Secondary | ICD-10-CM | POA: Diagnosis not present

## 2018-01-19 DIAGNOSIS — M9904 Segmental and somatic dysfunction of sacral region: Secondary | ICD-10-CM | POA: Diagnosis not present

## 2018-01-19 DIAGNOSIS — M9903 Segmental and somatic dysfunction of lumbar region: Secondary | ICD-10-CM | POA: Diagnosis not present

## 2018-01-19 DIAGNOSIS — M9906 Segmental and somatic dysfunction of lower extremity: Secondary | ICD-10-CM | POA: Diagnosis not present

## 2018-01-19 DIAGNOSIS — M5442 Lumbago with sciatica, left side: Secondary | ICD-10-CM | POA: Diagnosis not present

## 2018-01-23 ENCOUNTER — Encounter: Payer: Self-pay | Admitting: Adult Health

## 2018-01-23 DIAGNOSIS — M5442 Lumbago with sciatica, left side: Secondary | ICD-10-CM | POA: Diagnosis not present

## 2018-01-23 DIAGNOSIS — M9906 Segmental and somatic dysfunction of lower extremity: Secondary | ICD-10-CM | POA: Diagnosis not present

## 2018-01-23 DIAGNOSIS — M9903 Segmental and somatic dysfunction of lumbar region: Secondary | ICD-10-CM | POA: Diagnosis not present

## 2018-01-23 DIAGNOSIS — M9904 Segmental and somatic dysfunction of sacral region: Secondary | ICD-10-CM | POA: Diagnosis not present

## 2018-01-24 ENCOUNTER — Encounter: Payer: Self-pay | Admitting: Adult Health

## 2018-01-25 ENCOUNTER — Ambulatory Visit: Payer: Medicare HMO | Admitting: Adult Health

## 2018-01-25 DIAGNOSIS — M9906 Segmental and somatic dysfunction of lower extremity: Secondary | ICD-10-CM | POA: Diagnosis not present

## 2018-01-25 DIAGNOSIS — M5442 Lumbago with sciatica, left side: Secondary | ICD-10-CM | POA: Diagnosis not present

## 2018-01-25 DIAGNOSIS — M9904 Segmental and somatic dysfunction of sacral region: Secondary | ICD-10-CM | POA: Diagnosis not present

## 2018-01-25 DIAGNOSIS — M9903 Segmental and somatic dysfunction of lumbar region: Secondary | ICD-10-CM | POA: Diagnosis not present

## 2018-01-25 LAB — CUP PACEART INCLINIC DEVICE CHECK
Date Time Interrogation Session: 20191120164023
Implantable Lead Implant Date: 20190530
Implantable Lead Location: 753860
MDC IDC LEAD IMPLANT DT: 20190530
MDC IDC LEAD LOCATION: 753859
MDC IDC PG IMPLANT DT: 20190530
MDC IDC PG SERIAL: 9022562
Pulse Gen Model: 2272

## 2018-01-26 ENCOUNTER — Encounter: Payer: Self-pay | Admitting: Adult Health

## 2018-01-26 ENCOUNTER — Ambulatory Visit: Payer: Medicare HMO | Admitting: Adult Health

## 2018-01-26 VITALS — BP 126/60 | HR 68 | Ht 60.0 in | Wt 163.0 lb

## 2018-01-26 DIAGNOSIS — Z9989 Dependence on other enabling machines and devices: Secondary | ICD-10-CM | POA: Diagnosis not present

## 2018-01-26 DIAGNOSIS — M9903 Segmental and somatic dysfunction of lumbar region: Secondary | ICD-10-CM | POA: Diagnosis not present

## 2018-01-26 DIAGNOSIS — R413 Other amnesia: Secondary | ICD-10-CM

## 2018-01-26 DIAGNOSIS — G4733 Obstructive sleep apnea (adult) (pediatric): Secondary | ICD-10-CM

## 2018-01-26 DIAGNOSIS — M9906 Segmental and somatic dysfunction of lower extremity: Secondary | ICD-10-CM | POA: Diagnosis not present

## 2018-01-26 DIAGNOSIS — M9904 Segmental and somatic dysfunction of sacral region: Secondary | ICD-10-CM | POA: Diagnosis not present

## 2018-01-26 DIAGNOSIS — M5442 Lumbago with sciatica, left side: Secondary | ICD-10-CM | POA: Diagnosis not present

## 2018-01-26 NOTE — Patient Instructions (Signed)
Your Plan:  Continue using CPAP nightly and greater than 4 hours each night Memory score is 25/30 If your symptoms worsen or you develop new symptoms please let us know.    Thank you for coming to see [place] at [facility]. I hope we have been able to provide you high quality care today.  You may receive a patient satisfaction survey over the next few weeks. We would appreciate your feedback and comments so that we may continue to improve ourselves and the health of our patients.

## 2018-01-26 NOTE — Progress Notes (Signed)
PATIENT: Melinda [name] DOB: September 22, 1931  REASON FOR VISIT: follow up HISTORY FROM: patient  HISTORY OF PRESENT ILLNESS: Today 01/26/18:  Melinda [name] is an 84 year old female with a history of obstructive sleep apnea on CPAP.  She returns today for follow-up.  Her download shows that she use her machine every night for compliance of 100%.  She used her machine greater than 4 hours 29 out of 30 days for compliance of 97%.  On average she uses her machine 8 hours and 36 minutes.  Her residual AHI is 2.1 on 5-12 cmH2O with EPR of 1.  She reports that the CPAP continues to work well for her.  She notices some changes in her memory.  She states that she can be in mid conversation and forget what she wants to say.  However 10 minutes later she will remember.  She is currently living alone but is planning to move to a living facility.  She no longer cooks.  In the past she has left food on the stove to burn.  She does not operate a motor vehicle.  She is able to complete all ADLs independently.  She returns today for evaluation.  HISTORY 07/21/2016: Melinda [name] is an 84 year old female with a history of obstructive sleep apnea on CPAP. She returns today for a compliance download. Her download indicates that she uses her machine nightly for a compliance of 100%. She uses her machine greater than 4 hours every night. On average she uses her machine 9 hours and 13 minutes. She is on AutoSet with a minimum pressure of 5 cm water pressure  And maximum pressure of 12 cm of water. Her residual AHI is 1.9. She does have a leak in the 95th percentile at 35.8 L/m. She reports that she is currently using nasal pillows. She recently changed the size to an extra-small to see if it works better. She does have some daytime sleepiness however she feels that it is manageable. In the past she was given a prescription of Nuvigil. She states that she only uses Nuvigil if she is going to drive for long distances.  She returns today for an evaluation.  REVIEW OF SYSTEMS: Out of a complete 14 system review of symptoms, the patient complains only of the following symptoms, and all other reviewed systems are negative.  Depression, nervous/anxious, walking difficulty, insomnia, apnea, leg swelling, runny nose, hearing loss, bruise/bleed easily  ALLERGIES: Allergies  Allergen Reactions  . Lasix [Furosemide] Itching  . Phenobarbital Itching  . Adhesive [Tape] Other (See Comments)    SKIN WILL TEAR EASILY; PLEASE USE PAPER TAPE OR COBAN WRAP!!  . Amitiza [Lubiprostone] Itching    HOME MEDICATIONS: Outpatient Medications Prior to Visit  Medication Sig Dispense Refill  . acetaminophen (TYLENOL) 650 MG CR tablet Take 650 mg by mouth daily as needed (arthritis pain).     [name] amiodarone (PACERONE) 200 MG tablet Take 1 tablet (200 mg total) by mouth daily. 180 tablet 3  . amLODipine (NORVASC) 5 MG tablet TAKE 1 TABLET BY MOUTH EVERY DAY 30 tablet 9  . apixaban (ELIQUIS) 5 MG TABS tablet Take 1 tablet (5 mg total) by mouth 2 (two) times daily. 60 tablet 0  . atorvastatin (LIPITOR) 40 MG tablet Take 40 mg by mouth daily.     . Calcium Carbonate-Vitamin D3 (CALCIUM 600+D3) 600-400 MG-UNIT TABS Take 1 tablet by mouth daily.    . cholecalciferol (VITAMIN D) 1000 UNITS tablet Take 1,000 Units by mouth daily.     [name]  desipramine (NORPRAMIN) 25 MG tablet Take 25 mg by mouth daily.    . famotidine (PEPCID) 20 MG tablet Take 20 mg by mouth daily.    [name] glucosamine-chondroitin 500-400 MG tablet Take 1 tablet by mouth every other day.     . linaclotide (LINZESS) 145 MCG CAPS capsule Take 145 mcg by mouth daily before breakfast.     . LORazepam (ATIVAN) 0.5 MG tablet Take 1 mg by mouth at bedtime.  5  . metoprolol succinate (TOPROL XL) 50 MG 24 hr tablet Take 1 tablet (50 mg total) by mouth daily. Take with or immediately following a meal. (Patient taking differently: Take 50 mg by mouth daily with lunch. Take with or  immediately following a meal.) 30 tablet 11  . Multiple Vitamins-Minerals (OCUVITE PRESERVISION PO) Take 1 tablet by mouth daily.    [name] Glycol-Propyl Glycol (SYSTANE) 0.4-0.3 % GEL ophthalmic gel Place 1 application into both eyes at bedtime.     . polyethylene glycol (MIRALAX / GLYCOLAX) packet Take 17 g by mouth at bedtime. Mix in 8 oz liquid and drink    . potassium chloride (K-DUR) 10 MEQ tablet TAKE 2 TABLETS BY MOUTH TWICE A DAY . (Patient taking differently: TAKE 2 TABLETS BY MOUTH DAILY) 120 tablet 3  . PRESCRIPTION MEDICATION CPAP with oxygen at bedtime    . torsemide (DEMADEX) 10 MG tablet Take 10 mg by mouth daily.    . vitamin B-12 (CYANOCOBALAMIN) 1000 MCG tablet Take 1,000 mcg by mouth daily.    . diclofenac sodium (VOLTAREN) 1 % GEL Apply 2 g topically 4 (four) times daily. (Patient not taking: Reported on 01/26/2018) 1 Tube 5  . amoxicillin (AMOXIL) 500 MG capsule Take 2,000 mg by mouth See admin instructions. Take 4 capsules (2000 mg) by mouth one hour prior to dental appointments     No facility-administered medications prior to visit.     PAST MEDICAL HISTORY: Past Medical History:  Diagnosis Date  . A-fib ([place])   . Acid reflux   . Anxiety   . Arthritis   . Cataract    surgery,bilateral  . Constipation   . Depression   . Diabetes mellitus without complication (HCC)    Pre diabetes  . Dyspnea on exertion    a. 07/2015 Echo: EF 55-60%, no rwma, mild MR, mod dil LA/RA/RV, mod TR, triv PR, PASP 26mmHg.  . Falls   . Family history of adverse reaction to anesthesia    mother always had n/v  . Hx: UTI (urinary tract infection)   . Hypercholesterolemia   . Hypertensive heart disease   . Insomnia   . Multifactorial gait disorder   . Non-obstructive CAD    a. 07/2015 Cath: LM 30-40d, LAD min irregs, LCX min irregs, RCA 30p, RA 17, RV 33/7, PA 42/19, PCWP 17.  . On home oxygen therapy    "2L w/CPAP at night" (02/24/2017)  . Osteoarthritis    a. 06/2015 s/p R  total hip arthroplasty.  [name] PAF (paroxysmal atrial fibrillation) ([place])    a. Dx 07/2015, CHA2DS2VASc = 6-->Eliquis.  . Premature atrial contractions   . Pulmonary hypertension ([facility])    a. 07/2015 Echo: PASP 28mmHg.  [name] Pyloric antral stenosis    in childhood,infancy  . Sleep apnea with use of continuous positive airway pressure (CPAP) 10/19/2012   a. compliant w/ CPAP.    PAST SURGICAL HISTORY: Past Surgical History:  Procedure Laterality Date  . ABDOMINAL HYSTERECTOMY  1970  . ABDOMINAL SURGERY  as a 79 day old baby  . BREAST BIOPSY Left 2017  . CARDIAC CATHETERIZATION N/A 07/14/2015   Procedure: Right/Left Heart Cath and Coronary Angiography;  Surgeon: Sanda Klein, MD;  Location: [facility];  Service: Cardiovascular;  Laterality: N/A;  . CARDIOVERSION N/A 11/04/2015   Procedure: CARDIOVERSION;  Surgeon: Lelon Perla, MD;  Location: [facility] ENDOSCOPY;  Service: Cardiovascular;  Laterality: N/A;  . CARDIOVERSION N/A 02/25/2016   Procedure: CARDIOVERSION;  Surgeon: Pixie Casino, MD;  Location: [facility] ENDOSCOPY;  Service: Cardiovascular;  Laterality: N/A;  . CARDIOVERSION N/A 12/13/2016   Procedure: CARDIOVERSION;  Surgeon: Fay Records, MD;  Location: [facility] ENDOSCOPY;  Service: Cardiovascular;  Laterality: N/A;  . CARDIOVERSION N/A 03/23/2017   Procedure: CARDIOVERSION;  Surgeon: Sueanne Margarita, MD;  Location: [facility] ENDOSCOPY;  Service: Cardiovascular;  Laterality: N/A;  . COLONOSCOPY    . EYE SURGERY Bilateral    cataract surgery with lens implant  . INNER EAR SURGERY Left    x 3  . JOINT REPLACEMENT Right   . LAPAROSCOPIC SALPINGO OOPHERECTOMY    . NM MYOVIEW LTD  11/30/2007   normal stress nuclear study/EF-83%  . PACEMAKER IMPLANT N/A 08/04/2017   Procedure: PACEMAKER IMPLANT;  Surgeon: Evans Lance, MD;  Location: [facility];  Service: Cardiovascular;  Laterality: N/A;  . TONSILLECTOMY    . TOTAL HIP ARTHROPLASTY Right 06/26/2015   Procedure: RIGHT TOTAL HIP  ARTHROPLASTY ANTERIOR APPROACH;  Surgeon: Leandrew Koyanagi, MD;  Location: [place];  Service: Orthopedics;  Laterality: Right;  . TRANSTHORACIC ECHOCARDIOGRAM  07/23/2009   mild concentric LVH/mild mitral insufficiency/ moderate tricuspid innsufficiency/mild pulmonary HTN/EF- 55-60%  . VAGINAL DELIVERY      FAMILY HISTORY: Family History  Problem Relation Age of Onset  . Heart attack Father   . Heart failure Mother   . Cancer Mother        colon,kidney  . Diabetes Sister   . Hypertension Sister   . Heart failure Sister     SOCIAL HISTORY: Social History   Socioeconomic History  . Marital status: Widowed    Spouse name: Not on file  . Number of children: 1  . Years of education: 60  . Highest education level: Not on file  Occupational History  . Occupation: Retired  Scientific laboratory technician  . Financial resource strain: Not on file  . Food insecurity:    Worry: Not on file    Inability: Not on file  . Transportation needs:    Medical: Not on file    Non-medical: Not on file  Tobacco Use  . Smoking status: Never Smoker  . Smokeless tobacco: Never Used  Substance and Sexual Activity  . Alcohol use: No    Alcohol/week: 0.0 standard drinks    Comment: rare  . Drug use: No  . Sexual activity: Not on file  Lifestyle  . Physical activity:    Days per week: Not on file    Minutes per session: Not on file  . Stress: Not on file  Relationships  . Social connections:    Talks on phone: Not on file    Gets together: Not on file    Attends religious service: Not on file    Active member of club or organization: Not on file    Attends meetings of clubs or organizations: Not on file    Relationship status: Not on file  . Intimate partner violence:    Fear of current or ex partner: Not  on file    Emotionally abused: Not on file    Physically abused: Not on file    Forced sexual activity: Not on file  Other Topics Concern  . Not on file  Social History Narrative   01/27/18 Patient lives  at [facility] [place], [place]   Patient is widowed.    Patient retired.    Patient some college.    Patient has one child.    Caffeine 1-2 cups daily avg.      PHYSICAL EXAM  Vitals:   01/26/18 1033  BP: 126/60  Pulse: 68  Weight: 163 lb (73.9 kg)  Height: 5' (1.524 m)   Body mass index is 31.83 kg/m.   MMSE - Mini Mental State Exam 01/26/2018  Orientation to time 5  Orientation to Place 4  Registration 3  Attention/ Calculation 3  Recall 1  Language- name 2 objects 2  Language- repeat 1  Language- follow 3 step command 3  Language- read & follow direction 1  Write a sentence 1  Copy design 1  Total score 25     Generalized: Well developed, in no acute distress   Neurological examination  Mentation: Alert oriented to time, place, history taking. Follows all commands speech and language fluent Cranial nerve II-XII: Pupils were equal round reactive to light. Extraocular movements were full, visual field were full on confrontational test. Facial sensation and strength were normal. Uvula tongue midline. Head turning and shoulder shrug  were normal and symmetric.  Neck circumference 15 inches, Mallampati 3+ Motor: The motor testing reveals 5 over 5 strength of all 4 extremities. Good symmetric motor tone is noted throughout.  Sensory: Sensory testing is intact to soft touch on all 4 extremities. No evidence of extinction is noted.  Coordination: Cerebellar testing reveals good finger-nose-finger and heel-to-shin bilaterally.  Gait and station: Patient uses a Rollator when ambulating .   DIAGNOSTIC DATA (LABS, IMAGING, TESTING) - I reviewed patient records, labs, notes, testing and imaging myself where available.  Lab Results  Component Value Date   WBC 5.6 08/25/2017   HGB 12.3 08/25/2017   HCT 38.8 08/25/2017   MCV 98.2 08/25/2017   PLT 171 08/25/2017      Component Value Date/Time   NA 142 08/25/2017 1546   NA 144 07/28/2017 1436   K 3.7 08/25/2017 1546     CL 103 08/25/2017 1546   CO2 30 08/25/2017 1546   GLUCOSE 93 08/25/2017 1546   BUN 21 (H) 08/25/2017 1546   BUN 22 07/28/2017 1436   CREATININE 1.04 (H) 08/25/2017 1546   CREATININE 0.82 02/19/2016 1217   CALCIUM 9.0 08/25/2017 1546   PROT 6.1 (L) 03/23/2017 0334   ALBUMIN 3.3 (L) 03/23/2017 0334   AST 20 03/23/2017 0334   ALT 19 03/23/2017 0334   ALKPHOS 57 03/23/2017 0334   BILITOT 1.3 (H) 03/23/2017 0334   GFRNONAA 48 (L) 08/25/2017 1546   GFRAA 56 (L) 08/25/2017 1546   Lab Results  Component Value Date   CHOL 153 02/23/2017   HDL 62 02/23/2017   LDLCALC 64 02/23/2017   TRIG 137 02/23/2017   CHOLHDL 2.5 02/23/2017   Lab Results  Component Value Date   HGBA1C 7.0 (H) 02/23/2017   No results found for: [id number] Lab Results  Component Value Date   TSH 3.334 03/23/2017      ASSESSMENT AND PLAN 84 y.o. year old female  has a past medical history of A-fib ([place]), Acid reflux, Anxiety, Arthritis, Cataract, Constipation, Depression,  Diabetes mellitus without complication ([place]), Dyspnea on exertion, Falls, Family history of adverse reaction to anesthesia, UTI (urinary tract infection), Hypercholesterolemia, Hypertensive heart disease, Insomnia, Multifactorial gait disorder, Non-obstructive CAD, On home oxygen therapy, Osteoarthritis, PAF (paroxysmal atrial fibrillation) ([place]), Premature atrial contractions, Pulmonary hypertension ([place]), Pyloric antral stenosis, and Sleep apnea with use of continuous positive airway pressure (CPAP) (10/19/2012). here with :  1.  Obstructive sleep apnea on CPAP 2.  Memory disturbance  The patient CPAP download shows excellent compliance and good treatment of her apnea.  She is encouraged to continue using CPAP nightly and greater than 4 hours each night.  Her memory score is 25 out of 30.  We will continue to monitor.  She is advised that if her symptoms worsen or she develops she should let us know.  She will follow-up in 1 year or sooner if  needed.     Ward Givens, MSN, NP-C 01/26/2018, 11:13 AM [facility] [address], [place], [place] [address] [phone]

## 2018-01-30 DIAGNOSIS — M9904 Segmental and somatic dysfunction of sacral region: Secondary | ICD-10-CM | POA: Diagnosis not present

## 2018-01-30 DIAGNOSIS — M9906 Segmental and somatic dysfunction of lower extremity: Secondary | ICD-10-CM | POA: Diagnosis not present

## 2018-01-30 DIAGNOSIS — M5442 Lumbago with sciatica, left side: Secondary | ICD-10-CM | POA: Diagnosis not present

## 2018-01-30 DIAGNOSIS — M9903 Segmental and somatic dysfunction of lumbar region: Secondary | ICD-10-CM | POA: Diagnosis not present

## 2018-02-01 DIAGNOSIS — E785 Hyperlipidemia, unspecified: Secondary | ICD-10-CM | POA: Diagnosis not present

## 2018-02-01 DIAGNOSIS — I10 Essential (primary) hypertension: Secondary | ICD-10-CM | POA: Diagnosis not present

## 2018-02-01 DIAGNOSIS — G4733 Obstructive sleep apnea (adult) (pediatric): Secondary | ICD-10-CM | POA: Diagnosis not present

## 2018-02-01 DIAGNOSIS — R0902 Hypoxemia: Secondary | ICD-10-CM | POA: Diagnosis not present

## 2018-02-01 DIAGNOSIS — M48061 Spinal stenosis, lumbar region without neurogenic claudication: Secondary | ICD-10-CM | POA: Diagnosis not present

## 2018-02-01 DIAGNOSIS — I5043 Acute on chronic combined systolic (congestive) and diastolic (congestive) heart failure: Secondary | ICD-10-CM | POA: Diagnosis not present

## 2018-02-08 ENCOUNTER — Ambulatory Visit (INDEPENDENT_AMBULATORY_CARE_PROVIDER_SITE_OTHER): Payer: Medicare HMO

## 2018-02-08 DIAGNOSIS — I495 Sick sinus syndrome: Secondary | ICD-10-CM

## 2018-02-08 DIAGNOSIS — D649 Anemia, unspecified: Secondary | ICD-10-CM | POA: Diagnosis not present

## 2018-02-08 DIAGNOSIS — E1169 Type 2 diabetes mellitus with other specified complication: Secondary | ICD-10-CM | POA: Diagnosis not present

## 2018-02-08 NOTE — Progress Notes (Signed)
Remote pacemaker transmission.   

## 2018-02-09 DIAGNOSIS — G4733 Obstructive sleep apnea (adult) (pediatric): Secondary | ICD-10-CM | POA: Diagnosis not present

## 2018-02-14 ENCOUNTER — Encounter: Payer: Self-pay | Admitting: Cardiology

## 2018-02-14 DIAGNOSIS — H353211 Exudative age-related macular degeneration, right eye, with active choroidal neovascularization: Secondary | ICD-10-CM | POA: Diagnosis not present

## 2018-02-28 ENCOUNTER — Other Ambulatory Visit: Payer: Self-pay | Admitting: Cardiology

## 2018-03-03 DIAGNOSIS — R0902 Hypoxemia: Secondary | ICD-10-CM | POA: Diagnosis not present

## 2018-03-03 DIAGNOSIS — G4733 Obstructive sleep apnea (adult) (pediatric): Secondary | ICD-10-CM | POA: Diagnosis not present

## 2018-03-03 DIAGNOSIS — I5043 Acute on chronic combined systolic (congestive) and diastolic (congestive) heart failure: Secondary | ICD-10-CM | POA: Diagnosis not present

## 2018-03-10 DIAGNOSIS — I10 Essential (primary) hypertension: Secondary | ICD-10-CM | POA: Diagnosis not present

## 2018-03-10 DIAGNOSIS — J309 Allergic rhinitis, unspecified: Secondary | ICD-10-CM | POA: Diagnosis not present

## 2018-03-10 DIAGNOSIS — M170 Bilateral primary osteoarthritis of knee: Secondary | ICD-10-CM | POA: Diagnosis not present

## 2018-03-10 DIAGNOSIS — G4733 Obstructive sleep apnea (adult) (pediatric): Secondary | ICD-10-CM | POA: Diagnosis not present

## 2018-03-23 DIAGNOSIS — E119 Type 2 diabetes mellitus without complications: Secondary | ICD-10-CM | POA: Diagnosis not present

## 2018-03-23 DIAGNOSIS — I10 Essential (primary) hypertension: Secondary | ICD-10-CM | POA: Diagnosis not present

## 2018-03-23 DIAGNOSIS — I2510 Atherosclerotic heart disease of native coronary artery without angina pectoris: Secondary | ICD-10-CM | POA: Diagnosis not present

## 2018-03-23 DIAGNOSIS — G4733 Obstructive sleep apnea (adult) (pediatric): Secondary | ICD-10-CM | POA: Diagnosis not present

## 2018-03-28 ENCOUNTER — Other Ambulatory Visit: Payer: Self-pay | Admitting: Internal Medicine

## 2018-03-28 DIAGNOSIS — Z1231 Encounter for screening mammogram for malignant neoplasm of breast: Secondary | ICD-10-CM

## 2018-03-31 LAB — CUP PACEART REMOTE DEVICE CHECK
Brady Statistic AP VP Percent: 1 %
Brady Statistic AS VP Percent: 1 %
Brady Statistic AS VS Percent: 1 %
Implantable Lead Implant Date: 20190530
Implantable Lead Location: 753860
Lead Channel Impedance Value: 660 Ohm
Lead Channel Pacing Threshold Amplitude: 0.625 V
Lead Channel Pacing Threshold Pulse Width: 0.5 ms
Lead Channel Pacing Threshold Pulse Width: 0.5 ms
Lead Channel Sensing Intrinsic Amplitude: 12 mV
Lead Channel Setting Pacing Amplitude: 0.875
Lead Channel Setting Pacing Amplitude: 2.25 V
Lead Channel Setting Pacing Pulse Width: 0.5 ms
MDC IDC LEAD IMPLANT DT: 20190530
MDC IDC LEAD LOCATION: 753859
MDC IDC MSMT BATTERY REMAINING LONGEVITY: 113 mo
MDC IDC MSMT BATTERY REMAINING PERCENTAGE: 95.5 %
MDC IDC MSMT BATTERY VOLTAGE: 3.01 V
MDC IDC MSMT LEADCHNL RA IMPEDANCE VALUE: 450 Ohm
MDC IDC MSMT LEADCHNL RA PACING THRESHOLD AMPLITUDE: 1.25 V
MDC IDC MSMT LEADCHNL RA SENSING INTR AMPL: 5 mV
MDC IDC PG IMPLANT DT: 20190530
MDC IDC PG SERIAL: 9022562
MDC IDC SESS DTM: 20191204070013
MDC IDC SET LEADCHNL RV SENSING SENSITIVITY: 2 mV
MDC IDC STAT BRADY AP VS PERCENT: 99 %
MDC IDC STAT BRADY RA PERCENT PACED: 99 %
MDC IDC STAT BRADY RV PERCENT PACED: 1 %

## 2018-04-04 ENCOUNTER — Other Ambulatory Visit: Payer: Self-pay | Admitting: Physician Assistant

## 2018-04-07 DIAGNOSIS — I2510 Atherosclerotic heart disease of native coronary artery without angina pectoris: Secondary | ICD-10-CM | POA: Diagnosis not present

## 2018-04-07 DIAGNOSIS — E119 Type 2 diabetes mellitus without complications: Secondary | ICD-10-CM | POA: Diagnosis not present

## 2018-04-07 DIAGNOSIS — I10 Essential (primary) hypertension: Secondary | ICD-10-CM | POA: Diagnosis not present

## 2018-04-07 DIAGNOSIS — G4733 Obstructive sleep apnea (adult) (pediatric): Secondary | ICD-10-CM | POA: Diagnosis not present

## 2018-04-24 DIAGNOSIS — H353122 Nonexudative age-related macular degeneration, left eye, intermediate dry stage: Secondary | ICD-10-CM | POA: Diagnosis not present

## 2018-04-24 DIAGNOSIS — H35363 Drusen (degenerative) of macula, bilateral: Secondary | ICD-10-CM | POA: Diagnosis not present

## 2018-04-24 DIAGNOSIS — H3122 Choroidal dystrophy (central areolar) (generalized) (peripapillary): Secondary | ICD-10-CM | POA: Diagnosis not present

## 2018-04-24 DIAGNOSIS — H353211 Exudative age-related macular degeneration, right eye, with active choroidal neovascularization: Secondary | ICD-10-CM | POA: Diagnosis not present

## 2018-04-24 DIAGNOSIS — Z961 Presence of intraocular lens: Secondary | ICD-10-CM | POA: Diagnosis not present

## 2018-04-24 DIAGNOSIS — H35723 Serous detachment of retinal pigment epithelium, bilateral: Secondary | ICD-10-CM | POA: Diagnosis not present

## 2018-05-04 DIAGNOSIS — I5043 Acute on chronic combined systolic (congestive) and diastolic (congestive) heart failure: Secondary | ICD-10-CM | POA: Diagnosis not present

## 2018-05-04 DIAGNOSIS — G4733 Obstructive sleep apnea (adult) (pediatric): Secondary | ICD-10-CM | POA: Diagnosis not present

## 2018-05-04 DIAGNOSIS — R0902 Hypoxemia: Secondary | ICD-10-CM | POA: Diagnosis not present

## 2018-05-06 DIAGNOSIS — E119 Type 2 diabetes mellitus without complications: Secondary | ICD-10-CM | POA: Diagnosis not present

## 2018-05-06 DIAGNOSIS — I10 Essential (primary) hypertension: Secondary | ICD-10-CM | POA: Diagnosis not present

## 2018-05-09 ENCOUNTER — Ambulatory Visit
Admission: RE | Admit: 2018-05-09 | Discharge: 2018-05-09 | Disposition: A | Discharge status: Home / Self Care | Payer: Medicare Other | Source: Ambulatory Visit | Attending: Internal Medicine | Admitting: Internal Medicine

## 2018-05-09 DIAGNOSIS — Z1231 Encounter for screening mammogram for malignant neoplasm of breast: Secondary | ICD-10-CM

## 2018-05-10 ENCOUNTER — Ambulatory Visit (INDEPENDENT_AMBULATORY_CARE_PROVIDER_SITE_OTHER): Payer: Medicare Other | Admitting: *Deleted

## 2018-05-10 DIAGNOSIS — R001 Bradycardia, unspecified: Secondary | ICD-10-CM

## 2018-05-10 DIAGNOSIS — I495 Sick sinus syndrome: Secondary | ICD-10-CM

## 2018-05-10 LAB — CUP PACEART REMOTE DEVICE CHECK
Battery Remaining Longevity: 114 mo
Battery Voltage: 3.01 V
Brady Statistic AP VP Percent: 1 %
Brady Statistic AS VP Percent: 1 %
Brady Statistic AS VS Percent: 1 %
Brady Statistic RA Percent Paced: 99 %
Brady Statistic RV Percent Paced: 1 %
Implantable Lead Implant Date: 20190530
Implantable Lead Location: 753859
Lead Channel Impedance Value: 460 Ohm
Lead Channel Impedance Value: 660 Ohm
Lead Channel Pacing Threshold Amplitude: 1.125 V
Lead Channel Pacing Threshold Pulse Width: 0.5 ms
Lead Channel Pacing Threshold Pulse Width: 0.5 ms
Lead Channel Setting Pacing Amplitude: 1 V
Lead Channel Setting Pacing Pulse Width: 0.5 ms
Lead Channel Setting Sensing Sensitivity: 2 mV
MDC IDC LEAD IMPLANT DT: 20190530
MDC IDC LEAD LOCATION: 753860
MDC IDC MSMT BATTERY REMAINING PERCENTAGE: 95.5 %
MDC IDC MSMT LEADCHNL RA SENSING INTR AMPL: 5 mV
MDC IDC MSMT LEADCHNL RV PACING THRESHOLD AMPLITUDE: 0.75 V
MDC IDC MSMT LEADCHNL RV SENSING INTR AMPL: 12 mV
MDC IDC PG IMPLANT DT: 20190530
MDC IDC PG SERIAL: 9022562
MDC IDC SESS DTM: 20200304070015
MDC IDC SET LEADCHNL RA PACING AMPLITUDE: 2.125
MDC IDC STAT BRADY AP VS PERCENT: 99 %

## 2018-05-17 NOTE — Progress Notes (Signed)
Remote pacemaker transmission.   

## 2018-06-07 ENCOUNTER — Ambulatory Visit: Payer: Medicare HMO | Admitting: Cardiology

## 2018-07-24 ENCOUNTER — Telehealth: Payer: Self-pay | Admitting: Neurology

## 2018-07-24 NOTE — Telephone Encounter (Signed)
Due to current COVID 19 pandemic, our office is severely reducing in office visits until further notice, in order to minimize the risk to our patients and healthcare providers.   Got in touch with patient and confirmed a virtual visit for her 5/21 appointment. Patient verbalized understanding of the doxy.me process and I have sent her an e-mail with link and directions as well as my name and office number/hours for reference. Patient understands that she will receive a call from RN to update chart.  Pt understands that although there may be some limitations with this type of visit, we will take all precautions to reduce any security or privacy concerns.  Pt understands that this will be treated like an in office visit and we will file with pt's insurance, and there may be a patient responsible charge related to this service.

## 2018-07-25 ENCOUNTER — Telehealth (INDEPENDENT_AMBULATORY_CARE_PROVIDER_SITE_OTHER): Payer: Medicare Other | Admitting: Internal Medicine

## 2018-07-25 ENCOUNTER — Other Ambulatory Visit: Payer: Self-pay

## 2018-07-25 ENCOUNTER — Encounter: Payer: Self-pay | Admitting: Neurology

## 2018-07-25 ENCOUNTER — Telehealth: Payer: Self-pay | Admitting: Internal Medicine

## 2018-07-25 DIAGNOSIS — R001 Bradycardia, unspecified: Secondary | ICD-10-CM

## 2018-07-25 DIAGNOSIS — Z950 Presence of cardiac pacemaker: Secondary | ICD-10-CM | POA: Diagnosis not present

## 2018-07-25 DIAGNOSIS — I5032 Chronic diastolic (congestive) heart failure: Secondary | ICD-10-CM

## 2018-07-25 NOTE — Telephone Encounter (Signed)
Called the patient to go over her chart. She is not familiar with the video visits and how to complete this. I have changed the patient to telephone visit. [place] and states that she is unsure of her daily medication. I have given the patient my fax number to have someone send [place] a up to date list of medications. Pt still gets supplies from [facility] and still using the CPAP nightly.   Sitting and reading:3 Watching TV:3 Sitting inactive in a public place (ex. Theater or meeting):3 As a passenger in a car for an hour without a break:0 Lying down to rest in the afternoon:3 Sitting and talking to someone: 2 Sitting quietly after lunch (no alcohol):3 In a car, while stopped in traffic:0 Total:17

## 2018-07-25 NOTE — Telephone Encounter (Signed)
Returned call to Pt.  Pt states she has a landline.  Advised Dr. Lovena Le would call around 2:20.  Advised if he was late it was because computer issues, but she will get a call today.  Pt indicates understanding.  Consents to phone visit.

## 2018-07-25 NOTE — Telephone Encounter (Signed)
New Message   Patient has virtual appt today and needs a nurse to call them to setup for appointment.

## 2018-07-25 NOTE — Progress Notes (Signed)
Electrophysiology TeleHealth Note   Due to national recommendations of social distancing due to COVID 19, an audio/video telehealth visit is felt to be most appropriate for this patient at this time.  See MyChart message from today for the patient's consent to telehealth for [facility].   Date:  07/25/2018   ID:  Melinda [name], DOB 17-Feb-1932, MRN [id number]  Location: patient's home  Provider location: [address] [name]  Evaluation Performed: Follow-up visit  PCP:  Jodi Marble, MD  Cardiologist:  Peter Martinique, MD  Electrophysiologist:  Dr Lovena Le  Chief Complaint:  "I been doing ok."  History of Present Illness:    Melinda [name] is a 85 y.o. female who presents via audio/video conferencing for a telehealth visit today. She has a h/o COPD, sleep apnea, PAF, s/p PPM insertion.  Since last being seen in our clinic, the patient reports doing very well.  Today, she denies symptoms of palpitations, chest pain, shortness of breath,  lower extremity edema, dizziness, presyncope, or syncope.  The patient is otherwise without complaint today.  The patient denies symptoms of fevers, chills, cough, or new SOB worrisome for COVID 19.  Past Medical History:  Diagnosis Date  . A-fib ([place])   . Acid reflux   . Anxiety   . Arthritis   . Cataract    surgery,bilateral  . Constipation   . Depression   . Diabetes mellitus without complication (HCC)    Pre diabetes  . Dyspnea on exertion    a. 07/2015 Echo: EF 55-60%, no rwma, mild MR, mod dil LA/RA/RV, mod TR, triv PR, PASP 65mmHg.  . Falls   . Family history of adverse reaction to anesthesia    mother always had n/v  . Hx: UTI (urinary tract infection)   . Hypercholesterolemia   . Hypertensive heart disease   . Insomnia   . Multifactorial gait disorder   . Non-obstructive CAD    a. 07/2015 Cath: LM 30-40d, LAD min irregs, LCX min irregs, RCA 30p, RA 17, RV 33/7, PA 42/19, PCWP 17.  . On home  oxygen therapy    "2L w/CPAP at night" (02/24/2017)  . Osteoarthritis    a. 06/2015 s/p R total hip arthroplasty.  [name] PAF (paroxysmal atrial fibrillation) ([place])    a. Dx 07/2015, CHA2DS2VASc = 6-->Eliquis.  . Premature atrial contractions   . Pulmonary hypertension ([place])    a. 07/2015 Echo: PASP 44mmHg.  [name] Pyloric antral stenosis    in childhood,infancy  . Sleep apnea with use of continuous positive airway pressure (CPAP) 10/19/2012   a. compliant w/ CPAP.    Past Surgical History:  Procedure Laterality Date  . ABDOMINAL HYSTERECTOMY  1970  . ABDOMINAL SURGERY     as a 74 day old baby  . BREAST BIOPSY Left 2017  . CARDIAC CATHETERIZATION N/A 07/14/2015   Procedure: Right/Left Heart Cath and Coronary Angiography;  Surgeon: Sanda Klein, MD;  Location: [facility];  Service: Cardiovascular;  Laterality: N/A;  . CARDIOVERSION N/A 11/04/2015   Procedure: CARDIOVERSION;  Surgeon: Lelon Perla, MD;  Location: [facility] ENDOSCOPY;  Service: Cardiovascular;  Laterality: N/A;  . CARDIOVERSION N/A 02/25/2016   Procedure: CARDIOVERSION;  Surgeon: Pixie Casino, MD;  Location: [facility] ENDOSCOPY;  Service: Cardiovascular;  Laterality: N/A;  . CARDIOVERSION N/A 12/13/2016   Procedure: CARDIOVERSION;  Surgeon: Fay Records, MD;  Location: [facility] ENDOSCOPY;  Service: Cardiovascular;  Laterality: N/A;  . CARDIOVERSION N/A 03/23/2017  Procedure: CARDIOVERSION;  Surgeon: Sueanne Margarita, MD;  Location: [facility] ENDOSCOPY;  Service: Cardiovascular;  Laterality: N/A;  . COLONOSCOPY    . EYE SURGERY Bilateral    cataract surgery with lens implant  . INNER EAR SURGERY Left    x 3  . JOINT REPLACEMENT Right   . LAPAROSCOPIC SALPINGO OOPHERECTOMY    . NM MYOVIEW LTD  11/30/2007   normal stress nuclear study/EF-83%  . PACEMAKER IMPLANT N/A 08/04/2017   Procedure: PACEMAKER IMPLANT;  Surgeon: Evans Lance, MD;  Location: [facility];  Service: Cardiovascular;  Laterality: N/A;  . TONSILLECTOMY    . TOTAL HIP  ARTHROPLASTY Right 06/26/2015   Procedure: RIGHT TOTAL HIP ARTHROPLASTY ANTERIOR APPROACH;  Surgeon: Leandrew Koyanagi, MD;  Location: [facility];  Service: Orthopedics;  Laterality: Right;  . TRANSTHORACIC ECHOCARDIOGRAM  07/23/2009   mild concentric LVH/mild mitral insufficiency/ moderate tricuspid innsufficiency/mild pulmonary HTN/EF- 55-60%  . VAGINAL DELIVERY      Current Outpatient Medications  Medication Sig Dispense Refill  . acetaminophen (TYLENOL) 650 MG CR tablet Take 650 mg by mouth daily as needed (arthritis pain).     [name] amiodarone (PACERONE) 200 MG tablet Take 1 tablet (200 mg total) by mouth daily. 180 tablet 3  . amLODipine (NORVASC) 5 MG tablet TAKE 1 TABLET BY MOUTH EVERY DAY 30 tablet 9  . apixaban (ELIQUIS) 5 MG TABS tablet Take 1 tablet (5 mg total) by mouth 2 (two) times daily. 60 tablet 0  . atorvastatin (LIPITOR) 40 MG tablet Take 40 mg by mouth daily.     . Calcium Carbonate-Vitamin D3 (CALCIUM 600+D3) 600-400 MG-UNIT TABS Take 1 tablet by mouth daily.    . cholecalciferol (VITAMIN D) 1000 UNITS tablet Take 1,000 Units by mouth daily.     [name] desipramine (NORPRAMIN) 25 MG tablet Take 25 mg by mouth daily.    . diclofenac sodium (VOLTAREN) 1 % GEL Apply 2 g topically 4 (four) times daily. 1 Tube 5  . famotidine (PEPCID) 20 MG tablet Take 20 mg by mouth daily.    [name] glucosamine-chondroitin 500-400 MG tablet Take 1 tablet by mouth every other day.     . linaclotide (LINZESS) 145 MCG CAPS capsule Take 145 mcg by mouth daily before breakfast.     . LORazepam (ATIVAN) 0.5 MG tablet Take 1 mg by mouth at bedtime.  5  . metoprolol succinate (TOPROL XL) 50 MG 24 hr tablet Take 1 tablet (50 mg total) by mouth daily. Take with or immediately following a meal. (Patient taking differently: Take 50 mg by mouth daily with lunch. Take with or immediately following a meal.) 30 tablet 11  . Multiple Vitamins-Minerals (OCUVITE PRESERVISION PO) Take 1 tablet by mouth daily.    [name] Glycol-Propyl  Glycol (SYSTANE) 0.4-0.3 % GEL ophthalmic gel Place 1 application into both eyes at bedtime.     . polyethylene glycol (MIRALAX / GLYCOLAX) packet Take 17 g by mouth at bedtime. Mix in 8 oz liquid and drink    . potassium chloride (K-DUR) 10 MEQ tablet TAKE 2 TABLETS BY MOUTH TWICE A DAY 360 tablet 1  . PRESCRIPTION MEDICATION CPAP with oxygen at bedtime    . torsemide (DEMADEX) 10 MG tablet TAKE 2 TABLETS BY MOUTH EVERY DAY 180 tablet 1  . vitamin B-12 (CYANOCOBALAMIN) 1000 MCG tablet Take 1,000 mcg by mouth daily.     No current facility-administered medications for this visit.     Allergies:   Lasix [furosemide]; Phenobarbital;  Adhesive [tape]; and Amitiza [lubiprostone]   Social History:  The patient  reports that she has never smoked. She has never used smokeless tobacco. She reports that she does not drink alcohol or use drugs.   Family History:  The patient's  family history includes Cancer in her mother; Diabetes in her sister; Heart attack in her father; Heart failure in her mother and sister; Hypertension in her sister.   ROS:  Please see the history of present illness.   All other systems are personally reviewed and negative.    Exam:    Vital Signs:  p - 62,    Labs/Other Tests and Data Reviewed:    Recent Labs: 08/25/2017: BUN 21; Creatinine, Ser 1.04; Hemoglobin 12.3; Platelets 171; Potassium 3.7; Sodium 142   Wt Readings from Last 3 Encounters:  01/26/18 163 lb (73.9 kg)  12/06/17 164 lb (74.4 kg)  11/14/17 167 lb 12.8 oz (76.1 kg)     Other studies personally reviewed:  Last device remote is reviewed from [facility] PDF dated 3/20 which reveals normal device function, no arrhythmias    ASSESSMENT & PLAN:    1.  PAF - she has had no atrial fib since her last visit in September. 2. Sinus node dysfunction - she is asymptomatic, s/p PPM insertion. 3. HTN - her blood pressure has not been checked today but she says that it has been ok, just a little high at times.  4. COVID 19 screen The patient denies symptoms of COVID 19 at this time.  The importance of social distancing was discussed today.  Follow-up:  6 months Next remote: 6/20  Current medicines are reviewed at length with the patient today.   The patient does not have concerns regarding her medicines.  The following changes were made today:  none  Labs/ tests ordered today include:  No orders of the defined types were placed in this encounter.    Patient Risk:  after full review of this patients clinical status, I feel that they are at moderate risk at this time.  Today, I have spent 15 minutes with the patient with telehealth technology discussing all of the above.    Signed, [name], MD  07/25/2018 2:35 PM     [facility] [address] [place] [address] [phone] (office) [phone] (fax)

## 2018-07-27 ENCOUNTER — Other Ambulatory Visit: Payer: Self-pay

## 2018-07-27 ENCOUNTER — Encounter: Payer: Self-pay | Admitting: Neurology

## 2018-07-27 ENCOUNTER — Ambulatory Visit (INDEPENDENT_AMBULATORY_CARE_PROVIDER_SITE_OTHER): Payer: Medicare Other | Admitting: Neurology

## 2018-07-27 DIAGNOSIS — G4733 Obstructive sleep apnea (adult) (pediatric): Secondary | ICD-10-CM | POA: Diagnosis not present

## 2018-07-27 DIAGNOSIS — I5040 Unspecified combined systolic (congestive) and diastolic (congestive) heart failure: Secondary | ICD-10-CM | POA: Diagnosis not present

## 2018-07-27 DIAGNOSIS — Z9989 Dependence on other enabling machines and devices: Secondary | ICD-10-CM

## 2018-07-27 DIAGNOSIS — Z9981 Dependence on supplemental oxygen: Secondary | ICD-10-CM | POA: Diagnosis not present

## 2018-07-27 NOTE — Patient Instructions (Addendum)
RV in 6 month , virtual visit is optional. Face to face preferred for memory testing.   Complex sleep apnea with the use of continuous positive airway pressure; more central apneas among the residual apneas.  Insomnia;  chronic , yet improved on auto CPAP-  She takes Ativan for sleep.  No changes in settings. Her choice of interface. Continue to use with oxygen, now 24/7.  Delirium, can be part of early dementia. Next face to face visit with MMSE/ MOCA  Her central apnea, cheyne stokes respirations are new- worsening CHF related.

## 2018-07-27 NOTE — Progress Notes (Signed)
[facility]  PATIENT: Avalyn Molino Usery DOB: March 16, 1931  Virtual Visit via Telephone Note  I connected with [name] on 07/27/18 at  1:30 PM EDT by telephone and verified that I am speaking with the correct person using two identifiers.  Location: Patient: at hr new senior residence Provider: at [facility]    I discussed the limitations, risks, security and privacy concerns of performing an evaluation and management service by telephone and the availability of in person appointments. I also discussed with the patient that there may be a patient responsible charge related to this service. The patient expressed understanding and agreed to proceed.    Larey Seat, MD    REASON FOR VISIT: Followup for sleep apnea ; CPAP compliance.  CD : Mrs. Doubrava , a 85 year old  long time established female  CPAP patient is seen today, 07-21-2017 for RV.  She has moved from [place] to the [place], now residing in [place] with 2 of her sisters live close by.  She is not as active with her social activities that she used to like, going to place and the beach is a long time away.  She had Atrial fib / flutter, failed 3 cardio versions and was in delirium - 1-14 through 03-28-2017, and remained anoth 14 days in delirium at home, barely verbal, hallucinations," picking things out of the air "   .  She has been 100% compliant CPAP user again was 9 hours and 7 minutes on average use, she is using an air sense 10 AutoSet set between 5 and 12 cmH2O was 1 EPR and a residual AHI of 6.0.  3.2 of her residual apneas are central in nature.   The 95th percentile pressure is 11.9 and she does have mild to moderate air leaks through the mask.    She endorsed 3 points on a geriatric depression score, 19 points on the Epworth Sleepiness Scale which is very high and 50 on the fatigue severity scale, which is also high, I reviewed her current medications and some of  these medications are associated with excessive fatigue sleepiness and also physical soreness.  Amiodarone 200 mg take daily, she is anticoagulated on Eliquis, she takes Lipitor, vitamin D vitamin B12 daily ,  Pepcid daily 20 mg calcium and glucosamine, Linzess,  Ativan 2 tablets at bedtime multiple vitamins eyedrops, and potassium supplements.   She was started on torsemide, a stronger diuretic, at  10 mg 2 tablets by mouth daily.  She takes 1 in the morning and 1 at night, leading to nocturia-up to 3 times.  CPAP record_ She has about 7% of the recorded time and Cheyne-Stokes respirations- related to CHF ? CKD ?  She felt "wiped out " after her heart rate slowed to the 40s. She denies prescyncope.    HISTORY OF PRESENT ILLNESS: CM, last note 73 :  Ms. Kimpel 85 year old female returns for followup. She patient reports that her gait problem has stabilized she's doing better had not had a fall for the months.  She occasionally can use a cane or walker on bad days but not very often.  She was finally diagnosed officially with lumbar spinal stenosis, she has lost some muscle mass all through the hamstrings and thigh, lost some core muscle strength. She had previously fallen many times onto her left knee which still gives her trouble at times   The patient has more pain in her knee when walking on tiptoes,  she does not have foot drop . She says she has been told she has arthritis. She also complains of occasional burning paresthesias in the leg however this is very brief and once or twice a month. She does not wish to be placed on a medication. She remains on CPAP and she brought her machine for download. She does say that her machine is loud and old and she sometimes cuts it off due to the sound. She returns for reevaluation  03-22-14: Mrs. Giglia is here today for his yearly visit. She is an established CPAP user and endorsed today the Epworth sleepiness score at 4 points and fatigue severity  score at 47 points.A download of her CPAP was obtained today in the office,  showing a moderate to high air leak. However her apnea and hypopnea index is still well controlled and the patient is highly compliant. She is using CPAP at 10 cm water with a residual AHI of 6.8 and an average time of daily CPAP use of 7 hours and 17 minutes. She is considered 90% compliant by CMS criteria which is sufficient. The airleak however is some quite significant. The patient states that she notices a noise from the airleak and sometimes it will wake her up her machine is an outer set of onset machine and I feel that there must be a more comfortable mask for her to use without creating air leaks. She has used a nasal pillow since last year when I suggested the change. She notices that the airleak is worse when the head gear slips up over her scalp and once she pulls it back down the air leak stops. For the airleak seems to be episodic and not a chronic fact. The patient's medication list and allergy list was reviewed with my nurse, before the patient was back to the [place] area she resided on [place] and she have to drive about 4 hours or longer to see her local physicians at that time she used armodafinil or Nuvigil to keep herself awake and safely driving. There is no needs to refill this medication now.  03-27-15  Patient here for a CPAP compliance visit. Mrs. Kunst has moved to the [facility] [place] area to be closer to her physicians and medical system.  She endorsed the geriatric depression score today at only 1., she endorsed fatigue severity at 46 points and the Epworth sleepiness score at 7 points. She assured me that she has been weak using her CPAP compliantly but the model is no longer supported by a manufacturer and I'm not longer able to get data downloads from the machine. Since I have followed Mrs. Chumley for about 8 years and this is the age of her current machine. To assure that she has continued  apnea care, I will invite her for a re-titration to CPAP.  I would like one hour of baseline sleep to document her current level of apnea. The patient's CPAP machine also has a no longer functioning heating and humidifier system. For this reason alone should be exchanged. I will asked the CPAP to make an appointment that works for Mrs. Trapani schedule.  With data from the sleep study we should be able to order a new asleep machine for her covered by her current provided [facility].   I see Mrs. Vancamp today on 07/22/2015 after a recent hip replacement surgery on the right side. After completing her rehabilitation and returning home she suffered chest pain the very next day was brought to the  local emergency room where a cardiac cause was ruled out. Pulmonary embolism was also not found. The chest and has meanwhile resolved but its cause was never identified. Her atrial fibrillation also acted out and she had frequently atrial fibrillation spells, she is not chronically in A. Fib, but paroxysmally. Based on her history of atrial fibrillation and known obstructive sleep apnea she underwent a new polysomnography on 05/08/2015 the AHI was still 17.4, supine AHI 28.9, and the REM AHI was 52.3. No periodic limb movements were noted that the patient had prolonged desaturations 240 minutes of low oxygen with the nadir being at 74%. She was in need of a new CPAP machine ASAP alternative treatment such as dental devices or ENT surgery would not treat hypoxemia. 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 During prolonged toxemia there was no drop of muscle tone during REM sleep. On a pin has been used to treat this condition. The study was followed by a CPAP titration on 05/28/2015 9 cm water pressure seemed to work well for the patient. She still had 198 minutes of desaturations during the CPAP titration study. For this reason we will follow her with a pulse oximetry on CPAP. Her compliance data speak of 80%  compliance for over 4 hours of use but she uses the machine 93% of all days. She is using AutoSet right now between 5 and 12 cm water average user time is 6 hours and 26 minutes. The residual AHI is 5.3. Please note that unknown events are 2.3 are likely related to air leaks. Air leaks very greatly between severe and minor day by day she is no longer is tired and has noted more energy and more restorative sleep on a new machine the 91st percentile pressure is 10.2 and the is no need to change the settings. However with her history of paroxysmal A. fib this should be a fingertip pulse oximetry to follow while on CPAP, she advised me that she just did this test last Thursday night. I do not have results for it yet.    A-fib (HCC)    Acid reflux    Anxiety    Arthritis    Cataract    surgery,bilateral   Constipation    Depression    Diabetes mellitus without complication ([place])    Pre diabetes   Dyspnea on exertion    a. 07/2015 Echo: EF 55-60%, no rwma, mild MR, mod dil LA/RA/RV, mod TR, triv PR, PASP 11mmHg.   Falls    Family history of adverse reaction to anesthesia    mother always had n/v   Hx: UTI (urinary tract infection)    Hypercholesterolemia    Hypertensive heart disease    Insomnia    Multifactorial gait disorder    Non-obstructive CAD    a. 07/2015 Cath: LM 30-40d, LAD min irregs, LCX min irregs, RCA 30p, RA 17, RV 33/7, PA 42/19, PCWP 17.   On home oxygen therapy    "2L w/CPAP at night" (02/24/2017)   Osteoarthritis    a. 06/2015 s/p R total hip arthroplasty.   PAF (paroxysmal atrial fibrillation) ([place])    a. Dx 07/2015, CHA2DS2VASc = 6-->Eliquis.   Premature atrial contractions    Pulmonary hypertension ([place])    a. 07/2015 Echo: PASP 80mmHg.   Pyloric antral stenosis    in childhood,infancy   Sleep apnea with use of continuous positive airway pressure (CPAP) 10/19/2012   a. compliant w/ CPAP.    ROS:   All  systems  reviewed and negative except as noted in the HPI.        Past Surgical History:  Procedure Laterality Date   ABDOMINAL HYSTERECTOMY  1970   ABDOMINAL SURGERY     as a 81 day old baby   CARDIAC CATHETERIZATION N/A 07/14/2015   Procedure: Right/Left Heart Cath and Coronary Angiography;  Surgeon: Sanda Klein, MD;  Location: [place] [facility];  Service: Cardiovascular;  Laterality: N/A;   CARDIOVERSION N/A 11/04/2015   Procedure: CARDIOVERSION;  Surgeon: Lelon Perla, MD;  Location: [facility] ENDOSCOPY;  Service: Cardiovascular;  Laterality: N/A;   CARDIOVERSION N/A 02/25/2016   Procedure: CARDIOVERSION;  Surgeon: Pixie Casino, MD;  Location: [facility] ENDOSCOPY;  Service: Cardiovascular;  Laterality: N/A;   CARDIOVERSION N/A 12/13/2016   Procedure: CARDIOVERSION;  Surgeon: Fay Records, MD;  Location: [facility] ENDOSCOPY;  Service: Cardiovascular;  Laterality: N/A;   COLONOSCOPY     EYE SURGERY Bilateral    cataract surgery with lens implant   INNER EAR SURGERY Left    x 3   JOINT REPLACEMENT Right    LAPAROSCOPIC SALPINGO OOPHERECTOMY     NM MYOVIEW LTD  11/30/2007   normal stress nuclear study/EF-83%   TONSILLECTOMY     TOTAL HIP ARTHROPLASTY Right 06/26/2015   Procedure: RIGHT TOTAL HIP ARTHROPLASTY ANTERIOR APPROACH;  Surgeon: Leandrew Koyanagi, MD;  Location: [facility];  Service: Orthopedics;  Laterality: Right;   TRANSTHORACIC ECHOCARDIOGRAM  07/23/2009   mild concentric LVH/mild mitral insufficiency/ moderate tricuspid innsufficiency/mild pulmonary HTN/EF- 55-60%   VAGINAL DELIVERY                    REVIEW OF SYSTEMS: Full 14 system review of systems performed and notable only for those listed, all others are neg:   Hearing loss   cough, shortness of breath, palpitations.  Constipation,very easy bruising with petechiae,  Joint pain /joint swelling, walking difficulty- not longer on a walker and she resumed driving.   Depression  not clinically evident 3/15 on geriatric score.  Epworth high 19/ 24  and FSS  50 points, chronic insomnia on ativan , has trouble to fall asleep- but improved on new CPAP  Usually reads while waiting to go to sleep.   ALLERGIES: Allergies  Allergen Reactions   Lasix [Furosemide] Itching   Phenobarbital Itching   Adhesive [Tape] Other (See Comments)    SKIN WILL TEAR EASILY; PLEASE USE PAPER TAPE OR COBAN WRAP!!   Amitiza [Lubiprostone] Itching    HOME MEDICATIONS: Outpatient Medications Prior to Visit  Medication Sig Dispense Refill   acetaminophen (TYLENOL) 650 MG CR tablet Take 650 mg by mouth daily as needed (arthritis pain).      amiodarone (PACERONE) 200 MG tablet Take 1 tablet (200 mg total) by mouth daily. 180 tablet 3   amLODipine (NORVASC) 5 MG tablet TAKE 1 TABLET BY MOUTH EVERY DAY 30 tablet 9   apixaban (ELIQUIS) 5 MG TABS tablet Take 1 tablet (5 mg total) by mouth 2 (two) times daily. 60 tablet 0   atorvastatin (LIPITOR) 40 MG tablet Take 40 mg by mouth daily.      Calcium Carbonate-Vitamin D3 (CALCIUM 600+D3) 600-400 MG-UNIT TABS Take 1 tablet by mouth daily.     cholecalciferol (VITAMIN D) 1000 UNITS tablet Take 1,000 Units by mouth daily.      desipramine (NORPRAMIN) 25 MG tablet Take 25 mg by mouth daily.     diclofenac sodium (VOLTAREN) 1 % GEL Apply 2 g topically 4 (four)  times daily. 1 Tube 5   famotidine (PEPCID) 20 MG tablet Take 20 mg by mouth daily.     glucosamine-chondroitin 500-400 MG tablet Take 1 tablet by mouth every other day.      linaclotide (LINZESS) 145 MCG CAPS capsule Take 145 mcg by mouth daily before breakfast.      LORazepam (ATIVAN) 0.5 MG tablet Take 1 mg by mouth at bedtime.  5   metoprolol succinate (TOPROL XL) 50 MG 24 hr tablet Take 1 tablet (50 mg total) by mouth daily. Take with or immediately following a meal. (Patient taking differently: Take 50 mg by mouth daily with lunch. Take with or immediately following a meal.)  30 tablet 11   Multiple Vitamins-Minerals (OCUVITE PRESERVISION PO) Take 1 tablet by mouth daily.     Polyethyl Glycol-Propyl Glycol (SYSTANE) 0.4-0.3 % GEL ophthalmic gel Place 1 application into both eyes at bedtime.      polyethylene glycol (MIRALAX / GLYCOLAX) packet Take 17 g by mouth at bedtime. Mix in 8 oz liquid and drink     potassium chloride (K-DUR) 10 MEQ tablet TAKE 2 TABLETS BY MOUTH TWICE A DAY 360 tablet 1   PRESCRIPTION MEDICATION CPAP with oxygen at bedtime     torsemide (DEMADEX) 10 MG tablet TAKE 2 TABLETS BY MOUTH EVERY DAY 180 tablet 1   vitamin B-12 (CYANOCOBALAMIN) 1000 MCG tablet Take 1,000 mcg by mouth daily.     No facility-administered medications prior to visit.     PAST MEDICAL HISTORY: Past Medical History:  Diagnosis Date   A-fib ([place])    Acid reflux    Anxiety    Arthritis    Cataract    surgery,bilateral   Constipation    Depression    Diabetes mellitus without complication ([place])    Pre diabetes   Dyspnea on exertion    a. 07/2015 Echo: EF 55-60%, no rwma, mild MR, mod dil LA/RA/RV, mod TR, triv PR, PASP 15mmHg.   Falls    Family history of adverse reaction to anesthesia    mother always had n/v   Hx: UTI (urinary tract infection)    Hypercholesterolemia    Hypertensive heart disease    Insomnia    Multifactorial gait disorder    Non-obstructive CAD    a. 07/2015 Cath: LM 30-40d, LAD min irregs, LCX min irregs, RCA 30p, RA 17, RV 33/7, PA 42/19, PCWP 17.   On home oxygen therapy    "2L w/CPAP at night" (02/24/2017)   Osteoarthritis    a. 06/2015 s/p R total hip arthroplasty.   PAF (paroxysmal atrial fibrillation) ([place])    a. Dx 07/2015, CHA2DS2VASc = 6-->Eliquis.   Premature atrial contractions    Pulmonary hypertension ([place])    a. 07/2015 Echo: PASP 29mmHg.   Pyloric antral stenosis    in childhood,infancy   Sleep apnea with use of continuous positive airway pressure (CPAP) 10/19/2012   a. compliant w/ CPAP.      PAST SURGICAL HISTORY: Past Surgical History:  Procedure Laterality Date   ABDOMINAL HYSTERECTOMY  1970   ABDOMINAL SURGERY     as a 64 day old baby   BREAST BIOPSY Left 2017   CARDIAC CATHETERIZATION N/A 07/14/2015   Procedure: Right/Left Heart Cath and Coronary Angiography;  Surgeon: Sanda Klein, MD;  Location: [facility];  Service: Cardiovascular;  Laterality: N/A;   CARDIOVERSION N/A 11/04/2015   Procedure: CARDIOVERSION;  Surgeon: Lelon Perla, MD;  Location: [facility];  Service: Cardiovascular;  Laterality: N/A;   CARDIOVERSION N/A 02/25/2016   Procedure: CARDIOVERSION;  Surgeon: Pixie Casino, MD;  Location: [facility] ENDOSCOPY;  Service: Cardiovascular;  Laterality: N/A;   CARDIOVERSION N/A 12/13/2016   Procedure: CARDIOVERSION;  Surgeon: Fay Records, MD;  Location: [place];  Service: Cardiovascular;  Laterality: N/A;   CARDIOVERSION N/A 03/23/2017   Procedure: CARDIOVERSION;  Surgeon: Sueanne Margarita, MD;  Location: [place] [facility] ENDOSCOPY;  Service: Cardiovascular;  Laterality: N/A;   COLONOSCOPY     EYE SURGERY Bilateral    cataract surgery with lens implant   INNER EAR SURGERY Left    x 3   JOINT REPLACEMENT Right    LAPAROSCOPIC SALPINGO OOPHERECTOMY     NM MYOVIEW LTD  11/30/2007   normal stress nuclear study/EF-83%   PACEMAKER IMPLANT N/A 08/04/2017   Procedure: PACEMAKER IMPLANT;  Surgeon: Evans Lance, MD;  Location: [place] [facility];  Service: Cardiovascular;  Laterality: N/A;   TONSILLECTOMY     TOTAL HIP ARTHROPLASTY Right 06/26/2015   Procedure: RIGHT TOTAL HIP ARTHROPLASTY ANTERIOR APPROACH;  Surgeon: Leandrew Koyanagi, MD;  Location: [place];  Service: Orthopedics;  Laterality: Right;   TRANSTHORACIC ECHOCARDIOGRAM  07/23/2009   mild concentric LVH/mild mitral insufficiency/ moderate tricuspid innsufficiency/mild pulmonary HTN/EF- 55-60%   VAGINAL DELIVERY      FAMILY HISTORY: Family History  Problem Relation Age of Onset   Heart  attack Father    Heart failure Mother    Cancer Mother        colon,kidney   Diabetes Sister    Hypertension Sister    Heart failure Sister    Breast cancer Neg Hx     SOCIAL HISTORY: Social History   Socioeconomic History   Marital status: Widowed    Spouse name: Not on file   Number of children: 1   Years of education: 41   Highest education level: Not on file  Occupational History   Occupation: Retired  Scientist, product/process development strain: Not on file   Food insecurity:    Worry: Not on file    Inability: Not on Lexicographer needs:    Medical: Not on file    Non-medical: Not on file  Tobacco Use   Smoking status: Never Smoker   Smokeless tobacco: Never Used  Substance and Sexual Activity   Alcohol use: No    Alcohol/week: 0.0 standard drinks    Comment: rare   Drug use: No   Sexual activity: Not on file  Lifestyle   Physical activity:    Days per week: Not on file    Minutes per session: Not on file   Stress: Not on file  Relationships   Social connections:    Talks on phone: Not on file    Gets together: Not on file    Attends religious service: Not on file    Active member of club or organization: Not on file    Attends meetings of clubs or organizations: Not on file    Relationship status: Not on file   Intimate partner violence:    Fear of current or ex partner: Not on file    Emotionally abused: Not on file    Physically abused: Not on file    Forced sexual activity: Not on file  Other Topics Concern   Not on file  Social History Narrative   01/27/18 Patient lives at [place], [place], [place]   Patient is widowed.    Patient retired.  Patient some college.    Patient has one child.    Caffeine 1-2 cups daily avg.       There were no vitals filed for this visit. There is no height or weight on file to calculate BMI.    Now with pacemaker.   Mentation: Alert oriented to time, place, history  taking. Follows all commands speech and language fluent.   Hoarseness. Motor:not tested.    DIAGNOSTIC DATA (LABS, IMAGING, TESTING)   CPAP download - reviewed cardioversion record, hospital admission, afib- failed cardioversion.delirium. Complete hip replacement . New pacemaker. Had severe tachycardia. Echocardiogram pending, she was supposed to a have it at the residence- the technologist had a fever and was send back.   CPAP compliance is 100% between the time of 06-26-2018 and 07-25-2018.  This compliance accounts for days and hours of use with an average daily use of 7 hours 48 minutes.  The machine is an AutoSet air sense 10 serial [id number].  Minimum pressure 5 maximum pressure 12 cmH2O with 1 cm EPR.  Residual AHI is 2.2 at the 95th percentile pressure of 10.2 cmH2O.  There were 2 minutes of Cheyne-Stokes respirations per night on average.  These are attributed to the patient's underlying condition of congestive heart failure and pulmonary edema.      The Epworth sleepiness score was endorsed at 17 out of 24 points.   This in spite of a new pacemaker that has helped a little bit with edema and fluid in the lung.  CHF has progressed, the patient reports that she falls a lot and that she is 24/7 oxygen dependent.  ASSESSMENT AND PLAN Complex sleep apnea with the use of continuous positive airway pressure; more central apneas among the residual apneas.  Insomnia;  chronic , yet improved on auto CPAP-  She takes Ativan for sleep.  No changes in settings. Her choice of interface. Continue to use with oxygen, now 24/7.   Delirium, can be part of early dementia. Next face to face visit with MMSE/ MOCA   Her central apnea, cheyne stokes respirations are new- worsening CHF related.     Follow Up Instructions: RV virtually or face to face with NP in 6 month.     I discussed the assessment and treatment plan with the patient. The patient was provided an opportunity to ask questions and  all were answered. The patient agreed with the plan and demonstrated an understanding of the instructions.   The patient was advised to call back or seek an in-person evaluation if the symptoms worsen or if the condition fails to improve as anticipated.  I provided 17 minutes of non-face-to-face time during this encounter.   Larey Seat, MD    CC Dr Joylene Draft. Dr Kathrin Penner,  Dr Peter Martinique, MD    RV 6 month - face to face MOCA/ MMSE  [facility] [address] [place], [place] [address] [phone]

## 2018-07-28 ENCOUNTER — Telehealth: Payer: Self-pay | Admitting: Cardiology

## 2018-07-28 DIAGNOSIS — I2510 Atherosclerotic heart disease of native coronary artery without angina pectoris: Secondary | ICD-10-CM

## 2018-07-28 DIAGNOSIS — I5042 Chronic combined systolic (congestive) and diastolic (congestive) heart failure: Secondary | ICD-10-CM

## 2018-07-28 DIAGNOSIS — I480 Paroxysmal atrial fibrillation: Secondary | ICD-10-CM

## 2018-07-28 NOTE — Telephone Encounter (Signed)
If she needs an Echo it will need to be done on a different visit since we don't do Echo's at [facility] and I don't really know if this will be a virtual visit or not. I am Ok to order and Echo. Dx chronic CHF and atrial flutter.  Ariele Vidrio Martinique MD, [facility]

## 2018-07-28 NOTE — Telephone Encounter (Signed)
Cathy from [facility] called. The NP at the [facility] would like the patient to have an Echocardiogram done when the patient comes for her appointment on 06/30.  Please verify with Dr. Martinique.

## 2018-08-03 NOTE — Telephone Encounter (Signed)
Returned call to [place] at [facility] no answer.No voice mail.

## 2018-08-04 NOTE — Telephone Encounter (Signed)
Spoke to patient's Therapist, art at [employer].Dr.Jordan's recommendation given.Advised scheduler will call back to schedule Echo to be done at [facility] office.Also 6/30 appointment with Dr.Jordan may be changed to a virtual appointment.Scheduler will let you know.

## 2018-08-09 ENCOUNTER — Ambulatory Visit (INDEPENDENT_AMBULATORY_CARE_PROVIDER_SITE_OTHER): Payer: Medicare Other | Admitting: *Deleted

## 2018-08-09 DIAGNOSIS — I495 Sick sinus syndrome: Secondary | ICD-10-CM

## 2018-08-09 LAB — CUP PACEART REMOTE DEVICE CHECK
Battery Remaining Longevity: 122 mo
Battery Remaining Percentage: 95.5 %
Battery Voltage: 2.99 V
Brady Statistic AP VP Percent: 1 %
Brady Statistic AP VS Percent: 99 %
Brady Statistic AS VP Percent: 1 %
Brady Statistic AS VS Percent: 1 %
Brady Statistic RA Percent Paced: 99 %
Brady Statistic RV Percent Paced: 1 %
Date Time Interrogation Session: 20200603060014
Implantable Lead Implant Date: 20190530
Implantable Lead Implant Date: 20190530
Implantable Lead Location: 753859
Implantable Lead Location: 753860
Implantable Pulse Generator Implant Date: 20190530
Lead Channel Impedance Value: 450 Ohm
Lead Channel Impedance Value: 660 Ohm
Lead Channel Pacing Threshold Amplitude: 0.75 V
Lead Channel Pacing Threshold Amplitude: 0.875 V
Lead Channel Pacing Threshold Pulse Width: 0.5 ms
Lead Channel Pacing Threshold Pulse Width: 0.5 ms
Lead Channel Sensing Intrinsic Amplitude: 12 mV
Lead Channel Sensing Intrinsic Amplitude: 5 mV
Lead Channel Setting Pacing Amplitude: 1.125
Lead Channel Setting Pacing Amplitude: 1.75 V
Lead Channel Setting Pacing Pulse Width: 0.5 ms
Lead Channel Setting Sensing Sensitivity: 2 mV
Pulse Gen Model: 2272
Pulse Gen Serial Number: 9022562

## 2018-08-16 ENCOUNTER — Telehealth (HOSPITAL_COMMUNITY): Payer: Self-pay | Admitting: Radiology

## 2018-08-16 NOTE — Telephone Encounter (Signed)

## 2018-08-17 ENCOUNTER — Ambulatory Visit (HOSPITAL_COMMUNITY): Payer: Medicare Other | Attending: Cardiovascular Disease

## 2018-08-17 ENCOUNTER — Other Ambulatory Visit: Payer: Self-pay

## 2018-08-17 ENCOUNTER — Encounter: Payer: Self-pay | Admitting: Cardiology

## 2018-08-17 DIAGNOSIS — I5042 Chronic combined systolic (congestive) and diastolic (congestive) heart failure: Secondary | ICD-10-CM

## 2018-08-17 DIAGNOSIS — I2510 Atherosclerotic heart disease of native coronary artery without angina pectoris: Secondary | ICD-10-CM

## 2018-08-17 DIAGNOSIS — I480 Paroxysmal atrial fibrillation: Secondary | ICD-10-CM | POA: Diagnosis not present

## 2018-08-17 NOTE — Progress Notes (Signed)
Remote pacemaker transmission.   

## 2018-09-05 ENCOUNTER — Telehealth: Payer: Medicare Other | Admitting: Cardiology

## 2018-09-06 DEATH — deceased

## 2018-09-21 IMAGING — CT CT CERVICAL SPINE W/O CM
4 of 8 series · 12 of 33 positions shown, 13 images · non-contrast
Comparison: Head and cervical spine CT 05/07/2016.

CLINICAL DATA: 86-year-old female status post dizziness and fall
into [place]. Right head laceration. Recently placed ICD.

EXAM:
CT HEAD WITHOUT CONTRAST
CT CERVICAL SPINE WITHOUT CONTRAST
TECHNIQUE: Multidetector CT imaging of the head and cervical spine was
performed following the standard protocol without intravenous
contrast. Multiplanar CT image reconstructions of the cervical spine
were also generated.

[Series 7: c_spine 2.0 st · axial · 0.29mm/px · z∈[-186,-82]mm · 3 of 104 slices shown, 4 images]
[im 26/104  soft-tissue]
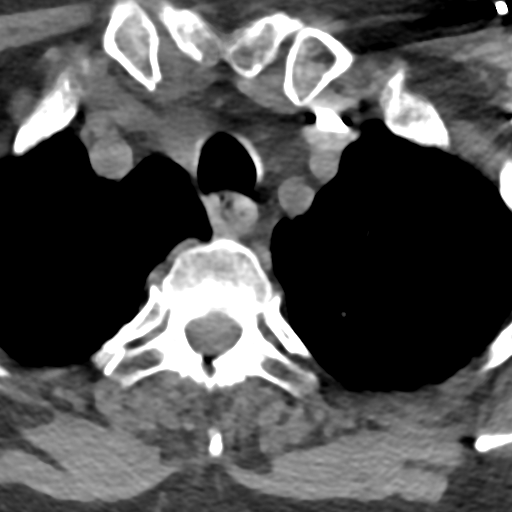
[im 26/104  bone]
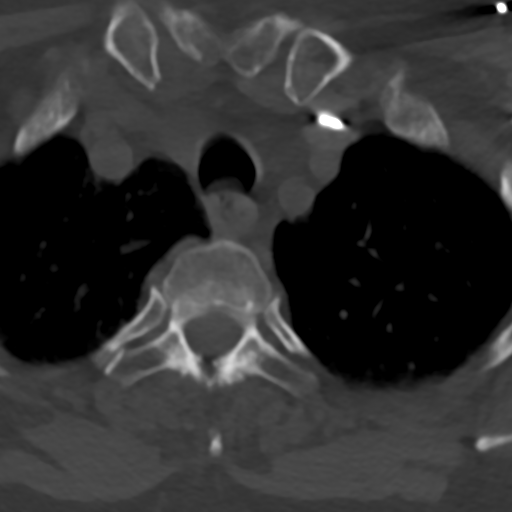
[im 52/104  bone]
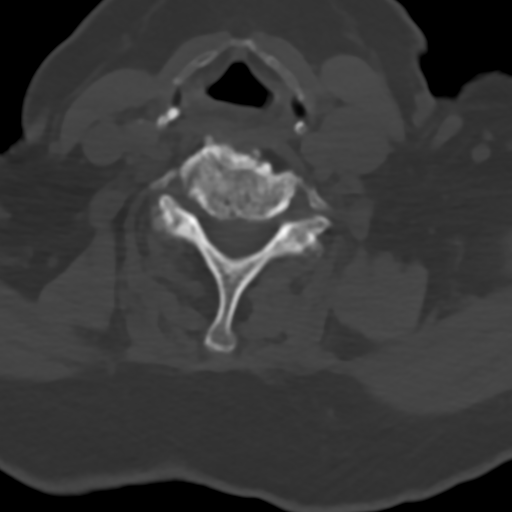
[im 78/104  bone]
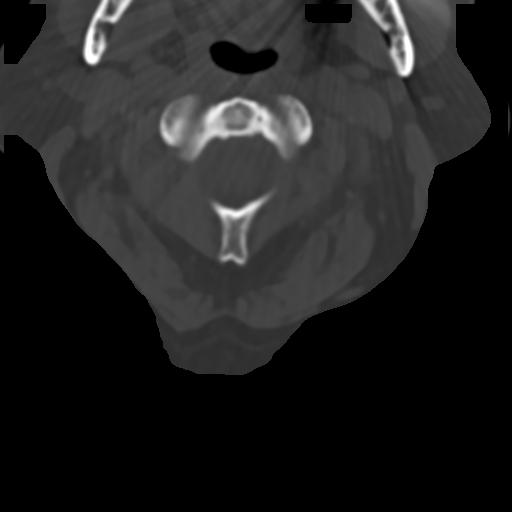

[Series 12: c_spine 2.0 sag bone · sagittal · 0.30mm/px · 5 of 61 slices shown]
[im 11/61  bone]
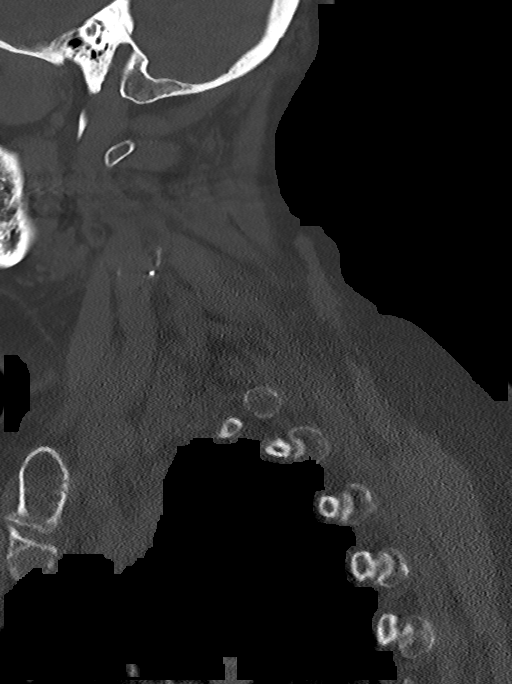
[im 21/61  bone]
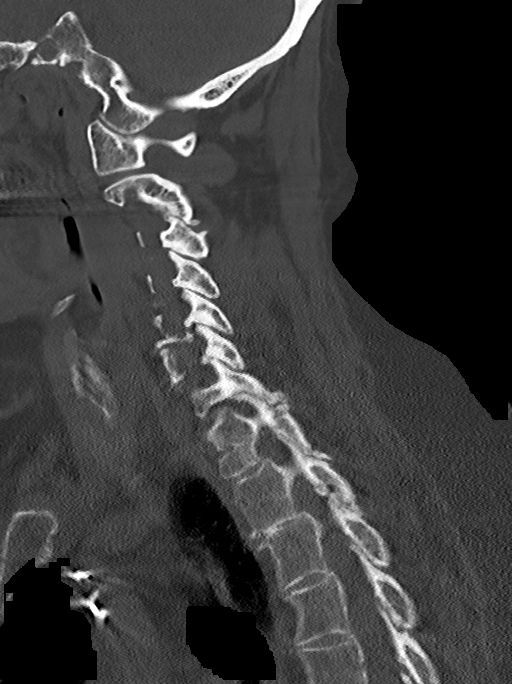
[im 31/61  bone]
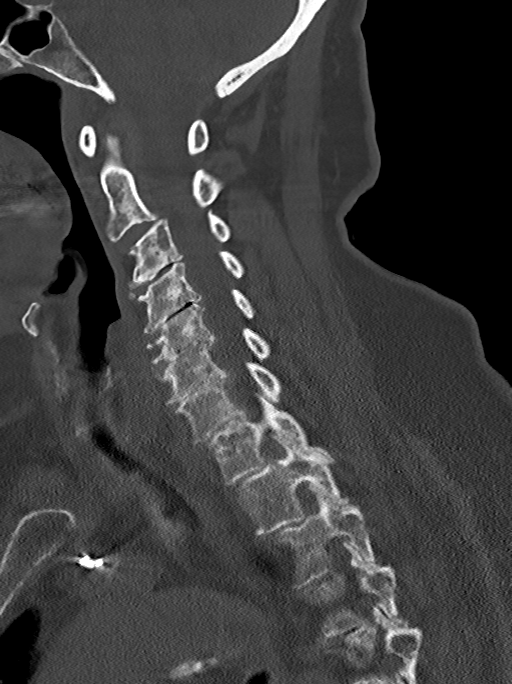
[im 41/61  bone]
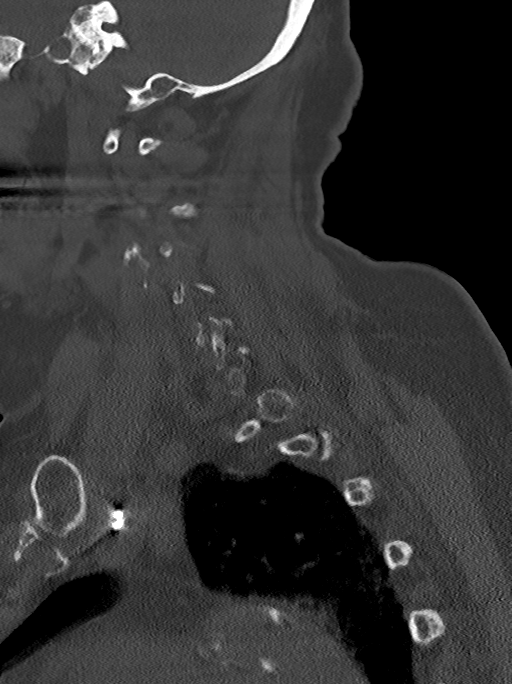
[im 51/61  bone]
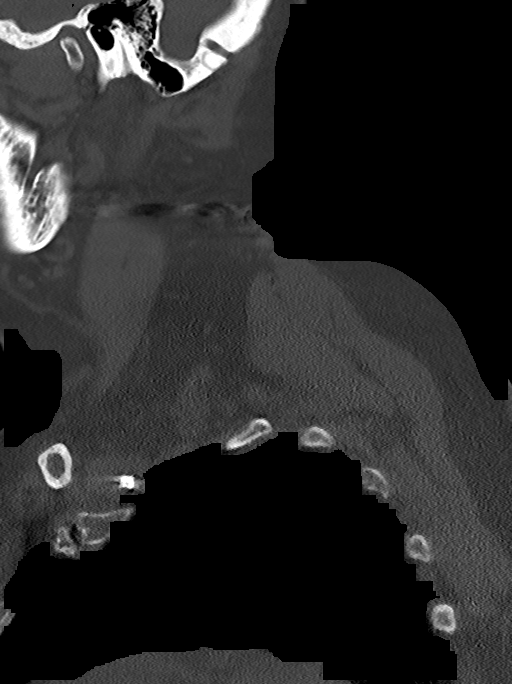

[Series 13: c_spine 2.0 cor bone · coronal · 0.30mm/px · 1 of 61 slices shown]
[im 31/61  bone]
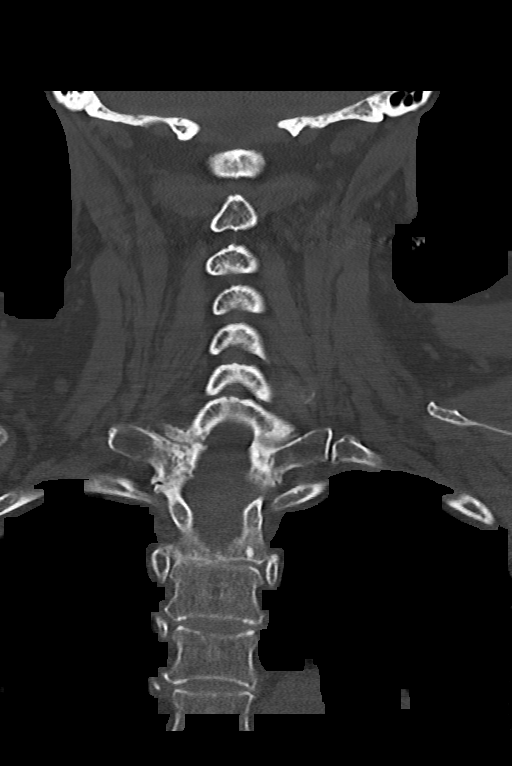

[Series 14: c_spine 2.0 orthogonals · axial · 0.21mm/px · z∈[-198,-113]mm · 3 of 106 slices shown]
[im 27/106  bone]
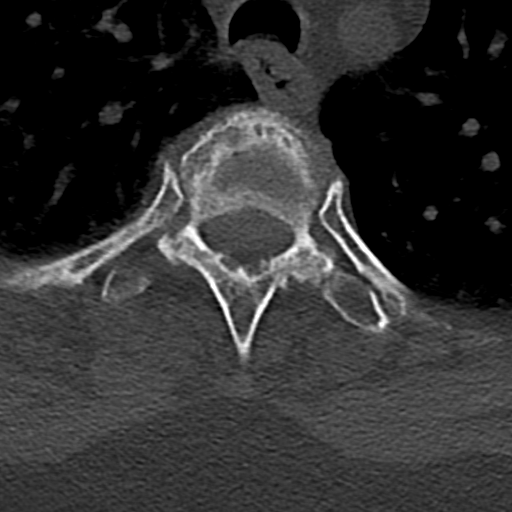
[im 53/106  bone]
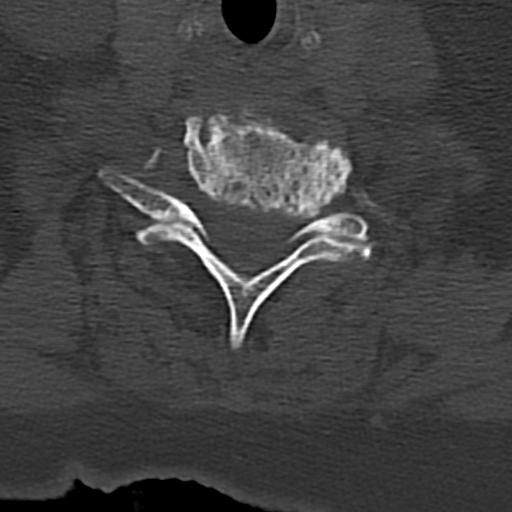
[im 79/106  bone]
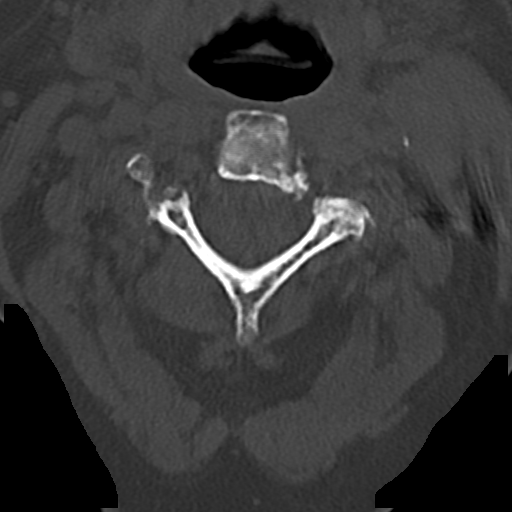

[12 of 33 positions shown; findings below may reference images not displayed]

FINDINGS: CT HEAD FINDINGS

Brain: Stable cerebral volume. No midline shift, ventriculomegaly,
mass effect, evidence of mass lesion, intracranial hemorrhage or
evidence of cortically based acute infarction.

A small hypodense focus in the central left thalamus is new or
increased since 4851 on series 4, image 14. Elsewhere gray-white
matter differentiation is stable and within normal limits for age.
No cortical encephalomalacia identified.

Vascular: Calcified atherosclerosis at the skull base. No suspicious
intracranial vascular hyperdensity.

Skull: Intact.

Sinuses/Orbits: Resolved left maxillary fluid level seen in 4851.
Visible Visualized paranasal sinuses and mastoids are clear.

Other: Negative orbits soft tissues. No discrete scalp injury is
evident.

CT CERVICAL SPINE FINDINGS

Alignment: Chronic straightening of cervical lordosis and
anterolisthesis of C2 on C3 appears stable. Multilevel mild
anterolisthesis also in the visible upper thoracic spine. Bilateral
posterior element alignment is within normal limits.

Skull base and vertebrae: Visualized skull base is intact. No
atlanto-occipital dissociation. No cervical spine fracture
identified.

Soft tissues and spinal canal: No prevertebral fluid or swelling. No
visible canal hematoma. Bilateral calcified cervical carotid
atherosclerosis.

Disc levels: Severe chronic cervical spine degeneration appears
stable. Acquired interbody ankylosis suspected from C5-C7 with
vacuum disc elsewhere. Widespread bulky endplate spurring.
Occasional severe facet hypertrophy.

Upper chest: Chronic appearing mild T4 superior endplate compression
fracture. The visible upper thoracic levels otherwise appear intact.
New left subclavian approach cardiac pacer or ICD lead since 4851.
Calcified aortic atherosclerosis. Otherwise negative visible
noncontrast mediastinum. Respiratory motion in the upper lungs which
appear stable.
IMPRESSION: 1. Progressed small vessel ischemia in the left thalamus since 4851.
Otherwise stable non contrast CT appearance of the brain.
2. No acute traumatic injury identified in the head or cervical
spine.
3. Chronic severe cervical spine degeneration.

## 2019-07-31 ENCOUNTER — Ambulatory Visit: Payer: Self-pay | Admitting: Adult Health
